# Patient Record
Sex: Female | Born: 1950 | Race: White | Hispanic: No | Smoking: Never smoker
Health system: Southern US, Community
[De-identification: ages and names within clinical notes are randomized; demographics above are authoritative.]

## PROBLEM LIST (undated history)

## (undated) DIAGNOSIS — F32A Depression, unspecified: Secondary | ICD-10-CM

## (undated) DIAGNOSIS — K219 Gastro-esophageal reflux disease without esophagitis: Secondary | ICD-10-CM

## (undated) DIAGNOSIS — R51 Headache: Secondary | ICD-10-CM

## (undated) DIAGNOSIS — F419 Anxiety disorder, unspecified: Secondary | ICD-10-CM

## (undated) DIAGNOSIS — G4730 Sleep apnea, unspecified: Secondary | ICD-10-CM

## (undated) DIAGNOSIS — D649 Anemia, unspecified: Secondary | ICD-10-CM

## (undated) DIAGNOSIS — I10 Essential (primary) hypertension: Secondary | ICD-10-CM

## (undated) DIAGNOSIS — Z22322 Carrier or suspected carrier of Methicillin resistant Staphylococcus aureus: Secondary | ICD-10-CM

## (undated) DIAGNOSIS — I209 Angina pectoris, unspecified: Secondary | ICD-10-CM

## (undated) DIAGNOSIS — F329 Major depressive disorder, single episode, unspecified: Secondary | ICD-10-CM

## (undated) DIAGNOSIS — R011 Cardiac murmur, unspecified: Secondary | ICD-10-CM

## (undated) DIAGNOSIS — N189 Chronic kidney disease, unspecified: Secondary | ICD-10-CM

## (undated) DIAGNOSIS — E559 Vitamin D deficiency, unspecified: Secondary | ICD-10-CM

## (undated) DIAGNOSIS — E119 Type 2 diabetes mellitus without complications: Secondary | ICD-10-CM

## (undated) DIAGNOSIS — R112 Nausea with vomiting, unspecified: Secondary | ICD-10-CM

## (undated) DIAGNOSIS — Z87442 Personal history of urinary calculi: Secondary | ICD-10-CM

## (undated) DIAGNOSIS — Z9889 Other specified postprocedural states: Secondary | ICD-10-CM

## (undated) DIAGNOSIS — E785 Hyperlipidemia, unspecified: Secondary | ICD-10-CM

## (undated) DIAGNOSIS — E039 Hypothyroidism, unspecified: Secondary | ICD-10-CM

## (undated) DIAGNOSIS — M1990 Unspecified osteoarthritis, unspecified site: Secondary | ICD-10-CM

## (undated) DIAGNOSIS — I739 Peripheral vascular disease, unspecified: Secondary | ICD-10-CM

## (undated) HISTORY — PX: APPENDECTOMY: SHX54

## (undated) HISTORY — PX: TONSILLECTOMY: SUR1361

## (undated) HISTORY — DX: Essential (primary) hypertension: I10

## (undated) HISTORY — PX: ABDOMINAL HYSTERECTOMY: SHX81

## (undated) HISTORY — PX: OTHER SURGICAL HISTORY: SHX169

## (undated) HISTORY — PX: CARDIAC CATHETERIZATION: SHX172

## (undated) HISTORY — PX: LEG SURGERY: SHX1003

## (undated) HISTORY — DX: Hyperlipidemia, unspecified: E78.5

## (undated) HISTORY — DX: Cardiac murmur, unspecified: R01.1

## (undated) HISTORY — PX: TUBAL LIGATION: SHX77

## (undated) HISTORY — PX: ELBOW SURGERY: SHX618

## (undated) HISTORY — PX: SHOULDER ARTHROSCOPY: SHX128

## (undated) HISTORY — PX: HARDWARE REMOVAL: SHX979

---

## 1998-08-11 ENCOUNTER — Ambulatory Visit (HOSPITAL_COMMUNITY): Admission: RE | Admit: 1998-08-11 | Discharge: 1998-08-11 | Payer: Self-pay | Admitting: Endocrinology

## 1998-09-04 ENCOUNTER — Ambulatory Visit (HOSPITAL_COMMUNITY): Admission: RE | Admit: 1998-09-04 | Discharge: 1998-09-04 | Payer: Self-pay | Admitting: Orthopedic Surgery

## 1998-09-04 ENCOUNTER — Encounter: Payer: Self-pay | Admitting: Orthopedic Surgery

## 1998-09-16 ENCOUNTER — Ambulatory Visit (HOSPITAL_BASED_OUTPATIENT_CLINIC_OR_DEPARTMENT_OTHER): Admission: RE | Admit: 1998-09-16 | Discharge: 1998-09-16 | Payer: Self-pay | Admitting: Orthopedic Surgery

## 1999-08-14 ENCOUNTER — Encounter: Payer: Self-pay | Admitting: Endocrinology

## 1999-08-14 ENCOUNTER — Ambulatory Visit (HOSPITAL_COMMUNITY): Admission: RE | Admit: 1999-08-14 | Discharge: 1999-08-14 | Payer: Self-pay | Admitting: Endocrinology

## 1999-10-21 ENCOUNTER — Ambulatory Visit (HOSPITAL_BASED_OUTPATIENT_CLINIC_OR_DEPARTMENT_OTHER): Admission: RE | Admit: 1999-10-21 | Discharge: 1999-10-22 | Payer: Self-pay | Admitting: Orthopedic Surgery

## 2000-06-28 ENCOUNTER — Encounter: Admission: RE | Admit: 2000-06-28 | Discharge: 2000-06-28 | Payer: Self-pay | Admitting: Gastroenterology

## 2000-06-28 ENCOUNTER — Encounter: Payer: Self-pay | Admitting: Gastroenterology

## 2000-07-25 ENCOUNTER — Ambulatory Visit (HOSPITAL_COMMUNITY): Admission: RE | Admit: 2000-07-25 | Discharge: 2000-07-25 | Payer: Self-pay | Admitting: Gastroenterology

## 2001-05-31 ENCOUNTER — Ambulatory Visit (HOSPITAL_BASED_OUTPATIENT_CLINIC_OR_DEPARTMENT_OTHER): Admission: RE | Admit: 2001-05-31 | Discharge: 2001-06-01 | Payer: Self-pay | Admitting: Orthopedic Surgery

## 2002-02-13 ENCOUNTER — Inpatient Hospital Stay (HOSPITAL_COMMUNITY): Admission: EM | Admit: 2002-02-13 | Discharge: 2002-02-15 | Payer: Self-pay | Admitting: Emergency Medicine

## 2002-02-13 ENCOUNTER — Encounter: Payer: Self-pay | Admitting: Emergency Medicine

## 2002-02-13 ENCOUNTER — Encounter: Payer: Self-pay | Admitting: Cardiovascular Disease

## 2002-03-29 ENCOUNTER — Ambulatory Visit (HOSPITAL_BASED_OUTPATIENT_CLINIC_OR_DEPARTMENT_OTHER): Admission: RE | Admit: 2002-03-29 | Discharge: 2002-03-29 | Payer: Self-pay | Admitting: Orthopedic Surgery

## 2002-05-02 ENCOUNTER — Encounter: Payer: Self-pay | Admitting: Obstetrics and Gynecology

## 2002-05-02 ENCOUNTER — Ambulatory Visit (HOSPITAL_COMMUNITY): Admission: RE | Admit: 2002-05-02 | Discharge: 2002-05-02 | Payer: Self-pay | Admitting: Obstetrics and Gynecology

## 2003-02-14 ENCOUNTER — Encounter: Admission: RE | Admit: 2003-02-14 | Discharge: 2003-02-14 | Payer: Self-pay | Admitting: Endocrinology

## 2003-02-14 ENCOUNTER — Encounter: Payer: Self-pay | Admitting: Endocrinology

## 2003-06-11 ENCOUNTER — Encounter: Payer: Self-pay | Admitting: Endocrinology

## 2003-06-11 ENCOUNTER — Ambulatory Visit (HOSPITAL_COMMUNITY): Admission: RE | Admit: 2003-06-11 | Discharge: 2003-06-11 | Payer: Self-pay | Admitting: Endocrinology

## 2004-10-20 ENCOUNTER — Ambulatory Visit: Payer: Self-pay | Admitting: Endocrinology

## 2004-10-27 ENCOUNTER — Ambulatory Visit: Payer: Self-pay | Admitting: Endocrinology

## 2005-07-26 ENCOUNTER — Ambulatory Visit (HOSPITAL_COMMUNITY): Admission: RE | Admit: 2005-07-26 | Discharge: 2005-07-26 | Payer: Self-pay | Admitting: Obstetrics and Gynecology

## 2005-10-28 ENCOUNTER — Encounter: Payer: Self-pay | Admitting: Pulmonary Disease

## 2005-12-31 ENCOUNTER — Encounter: Payer: Self-pay | Admitting: Pulmonary Disease

## 2006-01-17 ENCOUNTER — Encounter: Payer: Self-pay | Admitting: Pulmonary Disease

## 2006-02-06 ENCOUNTER — Encounter: Admission: RE | Admit: 2006-02-06 | Discharge: 2006-02-06 | Payer: Self-pay | Admitting: Internal Medicine

## 2006-07-29 ENCOUNTER — Encounter: Admission: RE | Admit: 2006-07-29 | Discharge: 2006-07-29 | Payer: Self-pay | Admitting: Sports Medicine

## 2006-09-08 ENCOUNTER — Ambulatory Visit (HOSPITAL_COMMUNITY): Admission: RE | Admit: 2006-09-08 | Discharge: 2006-09-08 | Payer: Self-pay | Admitting: Internal Medicine

## 2006-09-13 ENCOUNTER — Encounter: Admission: RE | Admit: 2006-09-13 | Discharge: 2006-09-13 | Payer: Self-pay | Admitting: Internal Medicine

## 2007-04-05 ENCOUNTER — Encounter: Admission: RE | Admit: 2007-04-05 | Discharge: 2007-04-05 | Payer: Self-pay | Admitting: Internal Medicine

## 2007-09-26 ENCOUNTER — Encounter: Admission: RE | Admit: 2007-09-26 | Discharge: 2007-09-26 | Payer: Self-pay | Admitting: Internal Medicine

## 2007-10-20 ENCOUNTER — Ambulatory Visit (HOSPITAL_COMMUNITY): Admission: RE | Admit: 2007-10-20 | Discharge: 2007-10-20 | Payer: Self-pay | Admitting: Obstetrics and Gynecology

## 2007-10-27 ENCOUNTER — Encounter: Admission: RE | Admit: 2007-10-27 | Discharge: 2007-10-27 | Payer: Self-pay | Admitting: Anesthesiology

## 2007-12-08 ENCOUNTER — Encounter: Admission: RE | Admit: 2007-12-08 | Discharge: 2007-12-08 | Payer: Self-pay | Admitting: Internal Medicine

## 2008-01-19 ENCOUNTER — Encounter: Admission: RE | Admit: 2008-01-19 | Discharge: 2008-01-19 | Payer: Self-pay | Admitting: Gastroenterology

## 2008-04-26 ENCOUNTER — Encounter: Admission: RE | Admit: 2008-04-26 | Discharge: 2008-04-26 | Payer: Self-pay | Admitting: Internal Medicine

## 2008-11-07 ENCOUNTER — Encounter: Admission: RE | Admit: 2008-11-07 | Discharge: 2008-11-07 | Payer: Self-pay | Admitting: Internal Medicine

## 2009-04-11 ENCOUNTER — Encounter: Admission: RE | Admit: 2009-04-11 | Discharge: 2009-04-11 | Payer: Self-pay | Admitting: Cardiology

## 2009-04-18 ENCOUNTER — Encounter: Payer: Self-pay | Admitting: Pulmonary Disease

## 2009-05-06 ENCOUNTER — Encounter: Payer: Self-pay | Admitting: Pulmonary Disease

## 2009-05-09 ENCOUNTER — Encounter: Payer: Self-pay | Admitting: Pulmonary Disease

## 2009-05-09 ENCOUNTER — Ambulatory Visit (HOSPITAL_COMMUNITY): Admission: RE | Admit: 2009-05-09 | Discharge: 2009-05-09 | Payer: Self-pay | Admitting: Cardiology

## 2009-06-04 DIAGNOSIS — R609 Edema, unspecified: Secondary | ICD-10-CM | POA: Insufficient documentation

## 2009-06-04 DIAGNOSIS — E119 Type 2 diabetes mellitus without complications: Secondary | ICD-10-CM

## 2009-06-04 DIAGNOSIS — IMO0001 Reserved for inherently not codable concepts without codable children: Secondary | ICD-10-CM | POA: Insufficient documentation

## 2009-06-04 DIAGNOSIS — G4733 Obstructive sleep apnea (adult) (pediatric): Secondary | ICD-10-CM | POA: Insufficient documentation

## 2009-06-04 DIAGNOSIS — I10 Essential (primary) hypertension: Secondary | ICD-10-CM | POA: Insufficient documentation

## 2009-06-04 DIAGNOSIS — Z794 Long term (current) use of insulin: Secondary | ICD-10-CM

## 2009-06-05 ENCOUNTER — Ambulatory Visit: Payer: Self-pay | Admitting: Pulmonary Disease

## 2009-06-05 DIAGNOSIS — R0602 Shortness of breath: Secondary | ICD-10-CM | POA: Insufficient documentation

## 2009-06-20 ENCOUNTER — Ambulatory Visit (HOSPITAL_COMMUNITY): Admission: RE | Admit: 2009-06-20 | Discharge: 2009-06-20 | Payer: Self-pay | Admitting: Cardiology

## 2009-07-04 ENCOUNTER — Ambulatory Visit: Payer: Self-pay | Admitting: Pulmonary Disease

## 2009-07-11 ENCOUNTER — Telehealth (INDEPENDENT_AMBULATORY_CARE_PROVIDER_SITE_OTHER): Payer: Self-pay | Admitting: *Deleted

## 2009-07-18 ENCOUNTER — Ambulatory Visit: Payer: Self-pay | Admitting: Pulmonary Disease

## 2009-11-14 ENCOUNTER — Encounter: Admission: RE | Admit: 2009-11-14 | Discharge: 2009-11-14 | Payer: Self-pay | Admitting: Internal Medicine

## 2010-01-08 ENCOUNTER — Ambulatory Visit: Payer: Self-pay | Admitting: Vascular Surgery

## 2010-11-29 ENCOUNTER — Encounter: Payer: Self-pay | Admitting: Endocrinology

## 2010-12-28 ENCOUNTER — Other Ambulatory Visit: Payer: Self-pay | Admitting: Internal Medicine

## 2010-12-28 DIAGNOSIS — Z1231 Encounter for screening mammogram for malignant neoplasm of breast: Secondary | ICD-10-CM

## 2011-01-08 ENCOUNTER — Ambulatory Visit
Admission: RE | Admit: 2011-01-08 | Discharge: 2011-01-08 | Disposition: A | Discharge status: Home / Self Care | Payer: PRIVATE HEALTH INSURANCE | Source: Ambulatory Visit | Attending: Internal Medicine | Admitting: Internal Medicine

## 2011-01-08 DIAGNOSIS — Z1231 Encounter for screening mammogram for malignant neoplasm of breast: Secondary | ICD-10-CM

## 2011-02-14 LAB — POCT I-STAT 3, ART BLOOD GAS (G3+)
Acid-Base Excess: 4 mmol/L — ABNORMAL HIGH (ref 0.0–2.0)
O2 Saturation: 97 %
TCO2: 29 mmol/L (ref 0–100)
pCO2 arterial: 38.4 mmHg (ref 35.0–45.0)
pO2, Arterial: 85 mmHg (ref 80.0–100.0)

## 2011-02-14 LAB — POCT I-STAT 3, VENOUS BLOOD GAS (G3P V)
pCO2, Ven: 42.6 mmHg — ABNORMAL LOW (ref 45.0–50.0)
pH, Ven: 7.384 — ABNORMAL HIGH (ref 7.250–7.300)
pO2, Ven: 37 mmHg (ref 30.0–45.0)

## 2011-02-14 LAB — GLUCOSE, CAPILLARY: Glucose-Capillary: 129 mg/dL — ABNORMAL HIGH (ref 70–99)

## 2011-03-23 NOTE — Procedures (Signed)
DUPLEX DEEP VENOUS EXAM - LOWER EXTREMITY   INDICATION:  Left calf pain/swelling.  Rule out deep venous thrombosis.   HISTORY:  Edema:  Intermittent left calf swelling for 9 months.  Trauma/Surgery:  History of left greater saphenous vein laser ablation  about 2 years ago.  Pain:  Intermittent left calf pain for about 9 months.  PE:  No.  Previous DVT:  No.  Anticoagulants:  Other:   DUPLEX EXAM:                CFV   SFV   PopV  PTV    GSV                R  L  R  L  R  L  R   L  R  L  Thrombosis    o  o     o     o      o  Spontaneous   +  +     +     +      +  Phasic        +  +     +     +      +  Augmentation  +  +     +     +      +  Compressible  +  +     +     +      +  Competent   Legend:  + - yes  o - no  p - partial  D - decreased   IMPRESSION:  1. No evidence of deep venous thrombosis noted in the left lower      extremity.  2. Limited visualization of the left calf veins due to patient body      habitus.  3. Evidence of left greater saphenous vein laser ablation noted.   A preliminary report was called to Dr. Lynne Logan office on 01/08/2010 and  given to [facility].    _____________________________  Di Kindle. Edilia Bo, M.D.   CH/MEDQ  D:  01/09/2010  T:  01/09/2010  Job:  [id number]

## 2011-03-23 NOTE — Cardiovascular Report (Signed)
Sue Davidson, Sue Davidson               ACCOUNT NO.:  [id number]   MEDICAL RECORD NO.:  [id number]          PATIENT TYPE:  OIB   LOCATION:  2899                         FACILITY:  [facility]   PHYSICIAN:  Thereasa Solo. Little, M.D. DATE OF BIRTH:  Jun 10, 1951   DATE OF PROCEDURE:  06/20/2009  DATE OF DISCHARGE:  06/20/2009                            CARDIAC CATHETERIZATION   INDICATIONS FOR TEST:  This 60 year old female has a strong family  history of heart disease.  She is insulin-dependent and she has  obstructive sleep apnea.  She has been having worsening problems with  lower extremity edema.  She had a negative nuclear study in 2008.  She  was seen by Dr. Shelle Iron who felt he needed right heart pressures to  resolve the pulmonary hypertension issue.  She is brought in as an  outpatient for elective right and left heart catheterization.   PROCEDURE:  The patient was prepped and draped in the usual sterile  fashion exposing both groins.  Local anesthetic with 1% Xylocaine.  It  took over an hour and 15 minutes to gain access.  Dr. Mariah Milling was able to  get a 7-French introducer sheath in the right femoral vein and I was  able to place a 5-French introducer sheath in the left femoral artery.  Following this, a Swan-Ganz catheter was advanced through its normal  route into the pulmonary artery with hemodynamic monitoring undertaken  throughout each station.  Cardiac output by thermodilution was  performed.  Oxygen saturations in both the AO and PA were obtained.   COMPLICATIONS:  None.   EQUIPMENT:  5-French Judkins configuration catheters and a 7-French Swan-  Ganz catheter.   RESULTS:  1. Right atrial pressure 5, right ventricular pressure 31/1, pulmonary      artery pressure 22/9, wedge was 6.   Central aortic pressure 115/55.  Left ventricular pressure 115/3 with no  aortic valve gradient noted on pullback.  1. Cardiac output by thermodilution was 4.69 for cardiac index of 2.0.  2.  Ventriculography:  Ventriculography in the RAO projection performed      at the beginning of the procedure revealed normal LV systolic      function.  There was PVC-induced mitral regurgitation.  The left      ventricular end-diastolic pressure was 12.  3. Coronary arteriography:  On fluoroscopy there was faint      calcification seen in the proximal LAD.      a.     Left main normal and bifurcated.      b.     Circumflex.  The circumflex was normal.  It gave rise to an       OM #1 which was a medium-sized vessel, OM #2 which was a larger       vessel that bifurcated and the ongoing circumflex was relatively       small.      c.     LAD.  The LAD extended to the apex of the heart gave rise to       a first diagonal vessel and was free of disease.  There was       insignificant proximal calcification.      d.     Right coronary artery.  There was proximal 30% narrowing of       the right coronary artery.  Distal vessel and the PDA was free of       disease.   CONCLUSIONS:  1. No significant occlusive coronary artery disease.  2. Normal LV function with normal left ventricular end-diastolic      pressure.  3. Normal pulmonary artery pressure 22/9 with an RV pressure of 31/1.   From a cardiac standpoint, I can clearly not explain her edema based on  her pulmonary artery pressures or LV function.  She will be discharged  to home today with followup in my office first of the week.           ______________________________  Thereasa Solo. Little, M.D.     ABL/MEDQ  D:  06/20/2009  T:  06/21/2009  Job:  [id number]   cc:   Juline Patch, M.D.  Barbaraann Share, MD,FCCP  Cath Lab

## 2011-03-26 NOTE — Op Note (Signed)
[place]. [facility]  Patient:    Sue Davidson, Sue Davidson                        MRN: [id number] Proc. Date: 05/31/01 Attending:  Nicki Reaper, M.D. CC:         Nicki Reaper, M.D. 2 copies   Operative Report  PREOPERATIVE DIAGNOSIS:  Impingement of right shoulder.  POSTOPERATIVE DIAGNOSIS:  Impingement of right shoulder.  OPERATION:  Arthroscopic subacromial decompression, manipulation of the right shoulder.  SURGEON:  Nicki Reaper, M.D.  ASSISTANT:  Artist Pais. Mina Marble, M.D.  ANESTHESIA:  General.  ANESTHESIOLOGIST:  Edwin Cap. Zoila Shutter, M.D.  HISTORY OF PRESENT ILLNESS:  The patient is a 60 year old female with a history of impingement of her right shoulder.  She has undergone a subacromial decompression on the left side with excellent results.  She is admitted now for a procedure on the right side.  MRI reveals no rotator cuff tear.  DESCRIPTION OF PROCEDURE:  The patient was brought to the operating room where a general endotracheal intubation anesthesia were carried out without difficulty.  She was prepped and draped using Betadine scrubbing solution. With the right shoulder free in a beach chair position, the joint was inflated through the posterior portal after marking landmarks with 30 cc of saline. The scope was then introduced.  Manipulation of the shoulder was performed to maximize mobility, in that there was decreased external rotation abduction.  A moderate amount of bleeding was present, and moderate synovitis was present. The rotator cuff was intact.  The humeral head, glenoid both showed intact cartilage.  Biceps tendon was intact.  The anterior capsule ligaments were also intact.  The subacromial space was then opened through the posterior portal using a blunt trocar.  This space was widened.  A lateral portal was then established, localizing this with a spinal needle.  The subacromial bursa was then removed with the [employer] wand, a  shaver.  A spur was identified anteriorly.  A subacromial acromionizer burr was then placed, and the anterior margin of the acromion was removed, along with the inferior margin of the clavicle where spurs were encountered.  Bleeders were electrocauterized. Complete resection of the bursa was performed.  The rotator cuff was intact. The instruments were removed.  Irrigation complete.  The area injected with 0.25% Marcaine with epinephrine.  A sterile compressive dressing and sling applied.  The patient tolerated the procedure well, and was taken to the recovery room for observation in satisfactory condition.  She is discharged home to return to the [facility] [place] in one week on Percocet and Keflex. DD:  05/31/01 TD:  05/31/01 Job: [id number]

## 2011-03-26 NOTE — Cardiovascular Report (Signed)
[place]. [facility]  Patient:    Sue Davidson, Sue Davidson Visit Number: [id number] MRN: [id number]          Service Type: MED Location: [phone] Attending Physician:  Berry, Jonathan [place] Dictated by:   Madaline Savage, M.D. Proc. Date: 02/14/02 Admit Date:  02/13/2002   CC:         Julieanne Manson, M.D.  Cardiac Catheterization Laboratory   Cardiac Catheterization  PROCEDURES PERFORMED: 1. Selective coronary angiography by Judkins technique. 2. Retrograde left heart catheterization. 3. Left ventricular angiography.  COMPLICATIONS: None.  ENTRY SITE: Right femoral.  DYE USED: Omnipaque.  PATIENT PROFILE: The patient is an obese, diabetic woman, who is 60 years old old, who had anterior chest pain that was very suggestive of coronary ischemia. Cardiac enzymes and ECGs have been negative. She enters the catheterization lab electively today to define coronary anatomy in view of her diabetes and symptoms.  RESULTS:  PRESSURES: The left ventricular pressure was 115/19, central aortic pressure 115/60 and mean of 75.  No significant aortic valve gradient noted.  ANGIOGRAPHIC RESULTS: The left main coronary artery was normal.  The LAD and diagonal were also normal.  The circumflex consisted of a major obtuse marginal branch, which was normal and a fairly large circumflex, which bifurcated distally and showed no lesions.  The right coronary artery was a dominant vessel of the circulation. No lesions were seen.  LEFT VENTRICULOGRAM: The left ventriculogram showed normal contractility, ejection fraction that was exercise stress test at 65%. No mitral regurgitation or LV thrombus seen.  FINAL DIAGNOSES: 1. Normal left ventricular systolic function. 2. Angiographically patent coronary arteries. Dictated by:   Madaline Savage, M.D. Attending Physician:  Berry, Jonathan [place] DD:  02/14/02 TD:  02/15/02 Job: [id number]

## 2011-03-26 NOTE — Procedures (Signed)
[place]. [facility]  Patient:    Sue Davidson, Sue Davidson                      MRN: [id number] Proc. Date: 07/25/00 Adm. Date:  [id number] Attending:  Nelda Marseille CC:         Alfonse Alpers. Dagoberto Ligas, M.D.  Thereasa Solo. Little, M.D.   Procedure Report  PROCEDURE:  Colonoscopy.  SURGEON:  Petra Kuba, M.D.  INDICATIONS:  Family history of colon cancer, colon polyps, multiple GI complaints, due for a screening.  INFORMED CONSENT:  Consent was signed after risk, benefits, methods and options were thoroughly discussed in the office.  MEDICINES USED:  Demerol 100 mg, Versed 10 mg.  DESCRIPTION OF PROCEDURE:  Rectal inspection is pertinent for external hemorrhoids.  Digital exam was negative.  Video pediatric colonoscope was inserted and fairly easily advanced to the level of the probable mid transverse.  At that point the scope began to loop and despite using the stiffener device rolling her on back and her right side and multiple abdominal pressures we were unable to advance past what we thought may have been the hepatic flexure.  The pediatric video colonoscope was slowly withdrawn.  No abnormalities were seen.  Once back in the rectum the scope was retroflexed pertinent for some internal hemorrhoids.  The scope was removed and the video colonoscope was inserted and advanced to the same level, this time however, with looping, we rolled her on her back and with using both left and right sided pressure we were able to advance to the cecum which was identified by the appendiceal orifice and the ileocecal valve.  Possibly on insertion in the hepatic flexure area a small 1-2 mm poly was seen which was not biopsied on insertion.  On slow withdrawal the prep was adequate.  There was some liquid stool that required washing and suctioning but on slow withdrawal we could not find the polyp mentioned above.  The scope was re-withdrawn, no additional findings were seen.   Anorectal ______ confirmed the hemorrhoids.  The scope was reinserted a short ways, air was suctioned, scope removed.  The patient tolerated the procedure adequately. There was no obvious immediate complications.  ENDOSCOPIC DIAGNOSIS: 1. Internal/external hemorrhoids. 2. Questionable tiny polyp on the right side of the colon seen on insertion    not found on withdrawal. 3. Tortuous long looping colon. 4. Otherwise within normal limits to the cecum.  PLAN:  Yearly rectals and guaiacs per primary care and Dr. Dagoberto Ligas.  Would consider an air contrast barium enema p.r.n. or in 5 years when screening is reneeded.  Might also have an option of virtual colonoscopy or other screening mechanisms at that junction.  I would be happy to see back sooner p.r.n. otherwise will see her back in 6 weeks to recheck symptoms and make sure no further work-up plans are needed. DD:  07/25/00 TD:  07/26/00 Job: [id number]

## 2011-03-26 NOTE — Discharge Summary (Signed)
[place]. [facility]  Patient:    Sue Davidson, Sue Davidson Visit Number: [id number] MRN: [id number]          Service Type: MED Location: [phone] Attending Physician:  Berry, Jonathan [place] Dictated by:   Adrian Saran, N.P. Admit Date:  02/13/2002 Discharge Date: 02/15/2002   CC:         Alfonse Alpers. Dagoberto Ligas, M.D.   Discharge Summary  DISCHARGE DIAGNOSES: 1. Chest pain noncardiac. 2. Intermittent tachycardia. 3. Hypertension. 4. Diabetes. 5. Hyperlipidemia. 6. Systolic ejection murmur.  PROCEDURE:  Cardiac catheterization.  COMPLICATIONS:  None.  CONDITION ON DISCHARGE:  Stable.  HISTORY OF PRESENT ILLNESS:  This is a 60 year old female patient who has had approximately three to four-day history of scapular pain that radiates to her anterior chest.  These are all exertionally induced and she has associated nausea, diaphoresis, and shortness of breath.  For approximately the last two to three weeks, she has had generalized fatigue as well as lower extremity weakness.  She has also had a complaint of tachycardia with facial flushing and diaphoresis for the last several weeks.  She has been unaware of any irregularity to her heart rhythm.  On the day of admission, she apparently was driving to work when she had another onset of this tachycardia, flushing, and diaphoresis.  This time, however, it was associated with near syncope.  She managed to get back home and call her son who is a Company secretary who came and checked her blood pressure and found that it was somewhat elevated.  She was transported to the emergency room by EMS.  They administered one sublingual nitroglycerin in route.  She apparently developed the shoulder discomfort again which apparently was somewhat relieved with the administration of the nitroglycerin.  She was given another nitroglycerin in the ER without any further change in her discomfort.  PHYSICAL EXAMINATION:  GENERAL: She is  conscious and alert, well-oriented. VITAL SIGNS: Blood pressure 160/57, heart rate 77, respirations 18.  She is afebrile and her O2 sat was 97% on 2 liters.  EKG showed a normal sinus rhythm without any acute ST or T wave changes.  Chest x-ray showed no active disease.   LABORATORY DATA:  Normal CBC and BMP with the exception of glucose at 200. Initial cardiac enzymes showed a total CK of 103 with 2 MBs and troponin of 0.01.  There was no JVD or thyromegaly.  She did have bilateral carotid bruit.  Lungs were clear.  Heart regular rate and rhythm.  2/6 systolic murmur was noted at the left sternal border with no radiation.  Abdomen was obese, however, benign. Normal bowel sounds.  No bruit or hepatosplenomegaly.  Extremities were not edematous.  Upper extremity pulses +2 bilaterally.  Lower extremity pulses +1 bilaterally.  There was no bruit.  No focal deficits.  No neurological deficits.  HOSPITAL COURSE:  The patient was admitted with shortness of breath and bilateral shoulder pain as well as chest discomfort.  She will be ruled out for MI with serial enzymes.  She was planned for cardiac catheterization the next morning.  She was placed on IV nitroglycerin and heparin.  Aspirin was continued.  ACE inhibitor as well as Plavix were added to her regime. Two-dimensional echocardiogram was ordered to further evaluate her murmur.  CT scan of the chest and abdomen were ordered as well to rule out aortic dissection with her complaining of shoulder and back pain.  Results were normal.  The patient was taken  to the cardiac catheterization on February 14, 2002.  She had no CAD noted.  Coronary arteries were normal.  She had normal LV function. She remained pain-free for the rest of her admission.  Vital signs remained stable.  Repeat cardiac enzymes were negative.  She had some mild tachycardia the day after her catheterization, however, she was completely asymptomatic and was actually in bed  when it occurred.  Lopressor was continued.  Results of echocardiogram done on February 15, 2002, showed normal LV size and function with an EF noted to be 55 to 65%.  She had mild MR noted as well as pulmonary systolic pressure with mildly to moderately increased at 38 mmHg.  The patient was discharged home on February 15, 2002, in stable condition with no further complaints.  DISCHARGE MEDICATIONS: 1. Lipitor same dose as she was previously taking at home at h.s. 2. Actos 45 mg q.d. 3. Aspirin 81 mg q.d. 4. Triamterene/HCTZ 37.5/25 mg q.d. 5. Humalog Insulin 75/25 as taken at home previously. 6. Prinivil 10 mg 1/2 tablet q.d. 7. Lopressor 50 mg 1/2 tablet q.d.  She is not to engage in any strenuous activity, driving, or sexual activity for the next two days.  She is not to lift anything more than 5 pounds. She may resume her normal activities on Saturday, February 17, 2002.  She is to maintain a low salt, low fat, low cholesterol diet as well as her diabetic diet restrictions.  She may shower or bathe the following day, Friday, February 16, 2002.  If she has any noticable increase in pain, bruising, or swelling to her catheterization site, she is to contact Dr. Clarene Duke.  FOLLOW-UP:  She is to follow up with Dr. Clarene Duke in two to three weeks. She is to call for an appointment. Dictated by:   Adrian Saran, N.P. Attending Physician:  Berry, Jonathan [place] DD:  03/16/02 TD:  03/19/02 Job: [id number]

## 2011-03-26 NOTE — Op Note (Signed)
[place]. [facility]  Patient:    Sue Davidson, Sue Davidson Visit Number: [id number] MRN: [id number]          Service Type: DSU Location: [facility] Attending Physician:  Ronne Binning Dictated by:   Nicki Reaper, M.D. Proc. Date: 03/29/02 Admit Date:  03/29/2002                             Operative Report  PREOPERATIVE DIAGNOSIS:  de Quervains, left wrist.  POSTOPERATIVE DIAGNOSIS:  de Quervains, left wrist.  OPERATION:  Release of first dorsal compartment of left wrist.  SURGEON:  Nicki Reaper, M.D.  ASSISTANT:  Joaquin Courts, R.N.  ANESTHESIA:  Upper arm IV regional.  ANESTHESIOLOGIST:  Maren Beach, M.D.  HISTORY:  The patient is a 60 year old female with a history of de Quervains on her left wrist which has not responded to conservative treatment for a long period of time.  DESCRIPTION OF PROCEDURE:  The patient was brought to the operating room where a upper arm IV regional anesthetic was carried out without difficulty.  She was prepped and draped using Betadine scrubbing solution with the left arm free.  Longitudinal incision was made over the first dorsal compartment and carried down through subcutaneous tissue.  Bleeders were electrocauterized. Radial nerve was identified and protected.  The dissection carried down to the first dorsal compartment, which was found to be markedly thickened.  An incision was then made on the dorsal aspect of the compartment, releasing the extensor brevis and abductor longus.  No septum was present.  Significant adhesions were present between the tendons and multiple slips were present to the APL tendon.  The thumb was placed through a full range of motion; no further catching was identified.  The wound was irrigated.  The skin was closed with interrupted 5-0 nylon sutures.  A sterile compressive dressing and wrist splint were applied including the thumb.  The patient tolerated the procedure well and was taken to  the recovery room for observation in satisfactory condition.    She is discharged home to return to the [facility] [place] in one week on Vicodin and Keflex. Dictated by:   Nicki Reaper, M.D. Attending Physician:  Ronne Binning DD:  03/29/02 TD:  03/30/02 Job: [id number]

## 2011-07-14 ENCOUNTER — Observation Stay (HOSPITAL_COMMUNITY)
Admission: EM | Admit: 2011-07-14 | Discharge: 2011-07-15 | Disposition: A | Discharge status: Home / Self Care | Payer: 59 | Attending: Cardiovascular Disease | Admitting: Cardiovascular Disease

## 2011-07-14 ENCOUNTER — Emergency Department (HOSPITAL_COMMUNITY): Payer: 59

## 2011-07-14 DIAGNOSIS — R0989 Other specified symptoms and signs involving the circulatory and respiratory systems: Principal | ICD-10-CM | POA: Insufficient documentation

## 2011-07-14 DIAGNOSIS — R61 Generalized hyperhidrosis: Secondary | ICD-10-CM | POA: Insufficient documentation

## 2011-07-14 DIAGNOSIS — R609 Edema, unspecified: Secondary | ICD-10-CM | POA: Insufficient documentation

## 2011-07-14 DIAGNOSIS — R110 Nausea: Secondary | ICD-10-CM | POA: Insufficient documentation

## 2011-07-14 DIAGNOSIS — R079 Chest pain, unspecified: Secondary | ICD-10-CM | POA: Insufficient documentation

## 2011-07-14 DIAGNOSIS — E785 Hyperlipidemia, unspecified: Secondary | ICD-10-CM | POA: Insufficient documentation

## 2011-07-14 DIAGNOSIS — M549 Dorsalgia, unspecified: Secondary | ICD-10-CM | POA: Insufficient documentation

## 2011-07-14 DIAGNOSIS — R0602 Shortness of breath: Secondary | ICD-10-CM | POA: Insufficient documentation

## 2011-07-14 DIAGNOSIS — I10 Essential (primary) hypertension: Secondary | ICD-10-CM | POA: Insufficient documentation

## 2011-07-14 DIAGNOSIS — Z8249 Family history of ischemic heart disease and other diseases of the circulatory system: Secondary | ICD-10-CM | POA: Insufficient documentation

## 2011-07-14 DIAGNOSIS — R0609 Other forms of dyspnea: Principal | ICD-10-CM | POA: Insufficient documentation

## 2011-07-14 DIAGNOSIS — Z794 Long term (current) use of insulin: Secondary | ICD-10-CM | POA: Insufficient documentation

## 2011-07-14 DIAGNOSIS — C001 Malignant neoplasm of external lower lip: Secondary | ICD-10-CM | POA: Insufficient documentation

## 2011-07-14 DIAGNOSIS — G4733 Obstructive sleep apnea (adult) (pediatric): Secondary | ICD-10-CM | POA: Insufficient documentation

## 2011-07-14 DIAGNOSIS — E119 Type 2 diabetes mellitus without complications: Secondary | ICD-10-CM | POA: Insufficient documentation

## 2011-07-14 LAB — DIFFERENTIAL
Basophils Absolute: 0.1 10*3/uL (ref 0.0–0.1)
Basophils Relative: 1 % (ref 0–1)
Eosinophils Absolute: 0.1 10*3/uL (ref 0.0–0.7)
Monocytes Absolute: 0.6 10*3/uL (ref 0.1–1.0)
Monocytes Relative: 6 % (ref 3–12)
Neutro Abs: 6.6 10*3/uL (ref 1.7–7.7)

## 2011-07-14 LAB — AMYLASE: Amylase: 62 U/L (ref 0–105)

## 2011-07-14 LAB — CBC
Hemoglobin: 13.7 g/dL (ref 12.0–15.0)
MCH: 29 pg (ref 26.0–34.0)
MCHC: 33.7 g/dL (ref 30.0–36.0)
Platelets: 265 10*3/uL (ref 150–400)
RDW: 15.5 % (ref 11.5–15.5)

## 2011-07-14 LAB — COMPREHENSIVE METABOLIC PANEL
ALT: 19 U/L (ref 0–35)
BUN: 24 mg/dL — ABNORMAL HIGH (ref 6–23)
CO2: 32 mEq/L (ref 19–32)
Calcium: 9.9 mg/dL (ref 8.4–10.5)
GFR calc Af Amer: >60 mL/min (ref >60)
GFR calc non Af Amer: 50 mL/min — ABNORMAL LOW (ref >60)
Glucose, Bld: 149 mg/dL — ABNORMAL HIGH (ref 70–99)
Sodium: 139 mEq/L (ref 135–145)
Total Protein: 8 g/dL (ref 6.0–8.3)

## 2011-07-14 LAB — MAGNESIUM: Magnesium: 2.5 mg/dL (ref 1.5–2.5)

## 2011-07-14 LAB — URINALYSIS, ROUTINE W REFLEX MICROSCOPIC
Bilirubin Urine: NEGATIVE
Ketones, ur: NEGATIVE mg/dL
Leukocytes, UA: NEGATIVE
Nitrite: NEGATIVE
Protein, ur: NEGATIVE mg/dL
Urobilinogen, UA: 0.2 mg/dL (ref 0.0–1.0)
pH: 7 (ref 5.0–8.0)

## 2011-07-14 LAB — BASIC METABOLIC PANEL
BUN: 22 mg/dL (ref 6–23)
Calcium: 9.8 mg/dL (ref 8.4–10.5)
Chloride: 98 mEq/L (ref 96–112)
Creatinine, Ser: 1.1 mg/dL (ref 0.50–1.10)
GFR calc Af Amer: >60 mL/min (ref >60)
GFR calc non Af Amer: 50 mL/min — ABNORMAL LOW (ref >60)

## 2011-07-14 LAB — POCT I-STAT TROPONIN I: Troponin i, poc: 0 ng/mL (ref 0.00–0.08)

## 2011-07-14 LAB — PRO B NATRIURETIC PEPTIDE: Pro B Natriuretic peptide (BNP): 63.3 pg/mL (ref 0–125)

## 2011-07-14 LAB — PROTIME-INR
INR: 0.96 (ref 0.00–1.49)
Prothrombin Time: 13 seconds (ref 11.6–15.2)

## 2011-07-15 LAB — HEMOGLOBIN A1C
Hgb A1c MFr Bld: 8.1 % — ABNORMAL HIGH (ref <5.7)
Mean Plasma Glucose: 186 mg/dL — ABNORMAL HIGH (ref <117)

## 2011-07-15 LAB — GLUCOSE, CAPILLARY
Glucose-Capillary: 186 mg/dL — ABNORMAL HIGH (ref 70–99)
Glucose-Capillary: 297 mg/dL — ABNORMAL HIGH (ref 70–99)

## 2011-07-15 LAB — LIPID PANEL
Cholesterol: 225 mg/dL — ABNORMAL HIGH (ref 0–200)
LDL Cholesterol: 158 mg/dL — ABNORMAL HIGH (ref 0–99)
VLDL: 37 mg/dL (ref 0–40)

## 2011-07-17 NOTE — Discharge Summary (Signed)
Sue Davidson, Sue Davidson               ACCOUNT NO.:  [id number]  MEDICAL RECORD NO.:  [id number]  LOCATION:  4732                         FACILITY:  [facility]  PHYSICIAN:  Thurmon Fair, MD     DATE OF BIRTH:  1951/01/15  DATE OF ADMISSION:  Jul 25, 2011 DATE OF DISCHARGE:  07/15/2011                              DISCHARGE SUMMARY   DISCHARGE DIAGNOSES: 1. Dyspnea on exertion. 2. Subscapular pain.  Cardiac enzymes were checked and negative. 3. Minor coronary artery disease in 2003 also again noted on cardiac     cath in August 2010.  No significant occlusive coronary artery     disease. 4. Diabetes mellitus type 2. 5. Hypertension. 6. Hyperlipidemia. 7. Morbid obesity. 8. Obstructive sleep apnea, uses CPAP. 9. Chronic lower extremity edema.  HOSPITAL COURSE:  Sue Davidson is a 60 year old morbidly obese female seen by Dr. Clarene Duke and Dr. Ricki Miller.  She has history of nonobstructive coronary artery disease, obstructive sleep apnea, hyperlipidemia, diabetes mellitus type 2, chronic lower extremity edema.  She presents with increasing dyspnea on exertion over the 2 weeks prior to admission.  She also complained of a dizzy spell at work prior to admission as well as chest pain between her shoulder blades.  She was admitted for observation to rule out acute coronary syndrome.  Initial cardiac enzymes were negative.  Also checked BMP, hemoglobin A1c, amylase and lipase, lipid panel, TSH, results listed below.  The patient also complained of nausea which has improved along with the back pain which has been improving.  EKG showed sinus rhythm with poor anterior R-wave progression.  Amylase and lipase were normal.  BMP normal.  She had been seen by Dr. Royann Shivers who feels she is ready for discharge home.  Her subscapular pain was likely musculoskeletal in nature.  It is exacerbated with movement inspiration.  The patient indicates that she had just started exercising on Saturday and has been going  just every day doing water aerobics, lifting weights, doing Ab Crunches machine. She has been encouraged to continue with weight loss and improving her diet.  DISCHARGE LABS:  WBC 9.8, hemoglobin 13.7, hematocrit 40.7, platelets 265,000.  PT 13, INR 0.96, PTT 30, D-dimer was 0.29.  Sodium 139, potassium 4.1, chloride 97, carbon dioxide 32, glucose 149, BUN 24, creatinine 1.11, total bilirubin 0.3, alkaline phosphatase 93, AST 22, ALT 19, total protein 8.0, albumin 3.7, calcium 9.9, magnesium 2.5, hemoglobin A1c was 8.1, amylase 62, lipase of 39, troponin 0.  BNP 63.3, total cholesterol 225, triglycerides 186, HDL 30, LDL 158, VLDL 37, and total cholesterol HDL ratio of 7.5.  TSH is 8.657.  Urinalysis was within normal limits and MRSA negative.  STUDIES/PROCEDURES:  Chest x-ray 25-Jul-2011 shows no active lung disease.  Mediastinal contours are normal.  The heart is within normal limits in size.  No bony abnormality was seen.  DISCHARGE MEDICATIONS: 1. Acetaminophen 325 mg 2 tablets by mouth every 4 hours as needed for     pain. 2. Metoprolol tartrate 25 mg 1/2 tablet by mouth twice daily. 3. Aspirin enteric coated 81 mg 1 tablet by mouth daily. 4. Benicar HCT 40/25 mg 1/2 tablet by mouth daily. 5.  Crestor 10 mg 1 tablet by mouth daily. 6. Furosemide 40 mg 1 tablet by mouth daily. 7. Humalog Mix 70/25, 40-85 units per sliding scale 3 times daily.  DISPOSITION:  Sue Davidson will be discharged home in stable condition. Recommended that she eats a heart-healthy diet low in carbohydrate.  She has no restrictions in activity and is encouraged to continue with her exercise plans for weight loss.  She will follow up with Dr. Clarene Duke and our office will call her with an appointment time.    ______________________________ Wilburt Finlay, PA   ______________________________ Thurmon Fair, MD    BH/MEDQ  D:  07/15/2011  T:  07/15/2011  Job:  [id number]  cc:   Juline Patch, M.D. Dr.  Clarene Duke  Electronically Signed by Wilburt Finlay PA on 07/16/2011 03:09:36 PM Electronically Signed by Thurmon Fair M.D. on 07/17/2011 09:50:52 AM

## 2011-07-21 NOTE — H&P (Signed)
NAMEMARIALY, URBANCZYK NO.:  [id number]  MEDICAL RECORD NO.:  [id number]  LOCATION:  [facility]                         FACILITY:  [facility]  PHYSICIAN:  Nicki Guadalajara, M.D.     DATE OF BIRTH:  1951-03-31  DATE OF ADMISSION:  07/14/2011 DATE OF DISCHARGE:                             HISTORY & PHYSICAL   CHIEF COMPLAINTS:  Dyspnea on exertion and midscapular back pain.  HISTORY OF PRESENT ILLNESS:  Ms. Sue Davidson is a 60 year old morbidly obese female followed by Dr. Clarene Duke and Dr. Ricki Miller.  She had been cathed twice in the past, once in 2003 when she had no significant disease.  In 2008, she had a low-risk Myoview.  She was cathed again in August 2010 for chest pain and shortness of breath.  There was also a question of pulmonary hypertension, she was being followed by Dr. Shelle Iron as well. Right and left heart cath was done in August 2010 and she had a 30% RCA, but no other significant coronary artery disease.  Her right heart pressures were normal.  She does have sleep apnea and is on CPAP.  She has a strong family history of coronary artery disease as well as diabetes, hypertension, and dyslipidemia.  She has not seen Dr. Clarene Duke since 2010.  Recently, she had gained back much of the weight she had previously lost.  Her daughter had a heart attack and a stent and she encouraged her mother to go with her to the gym.  Over the last couple of weeks, she has had increasing dyspnea on exertion.  Today, she was at work and had a dizzy spell.  She has had discomfort between her shoulder blades as well.  She came to the emergency room for further evaluation. Currently, she is symptom free.  Her past medical history is remarkable for morbid obesity.  She has type 2 insulin-dependent diabetes.  She has treated dyslipidemia and treated hypertension.  She has sleep apnea and is on CPAP at home.  Her home medications are: 1. Aspirin 81 mg a day. 2. Benicar 20/12.5 daily. 3. Crestor 10 mg  a day. 4. Lasix 40 mg a day. 5. She takes Humalog 75/25, 50 units in the morning and 75 units in     the evening.  She has no known drug allergies.  SOCIAL HISTORY:  She is married.  She has 2 children, 4 grandchildren. She is nonsmoker.  She works at a [employer].  She has a remarkable family history for coronary artery disease including her daughter who had a LAD stent last year.  REVIEW OF SYSTEMS:  The patient has had some nausea for the last couple days that has been intermittent and not related to meals.  She says that she noted increasing dyspnea on exertion, i.e., walking her dog around the block.  She has also had profuse sweating at times with exertion.  PHYSICAL EXAMINATION:  VITAL SIGNS:  Blood pressure is 148/67, pulse 92, and temp 98.3. GENERAL:  She is a morbidly obese female in no acute distress. HEENT:  Normocephalic.  Extraocular movements are intact.  Sclerae are anicteric.  Lids and conjunctivae within normal limits. NECK:  Without JVD or  bruit.  Thyroid is not enlarged. CHEST:  Clear to auscultation and percussion. CARDIAC:  Regular rate and rhythm without murmur, rub, or gallop. Normal S1 and S2. ABDOMEN:  Obese, nontender, and nondistended. EXTREMITIES:  Some chronic lower extremity venous changes and 1+ edema. NEURO:  Grossly intact.  She is awake, alert, oriented, and cooperative. Moves all extremities without obvious deficit. SKIN:  Cool and dry.  LABORATORY DATA:  White count 9.8, hemoglobin 13.7, hematocrit 40.7, and platelets 265.  Sodium 139, potassium 4.0, BUN 22, creatinine 1.1, and INR 0.96.  Troponin is negative x1.  Chest x-ray shows no active disease.  EKG shows sinus rhythm with poor anterior R-wave progression.  IMPRESSION: 1. Subscapular pain, rule out cardiac. 2. Dyspnea on exertion, rule out cardiac. 3. Minor coronary artery disease at catheterization in August 2010     with a 30% right coronary artery.  No other significant disease  and     normal coronaries in 2003. 4. Good left ventricular function. 5. Morbid obesity. 6. Sleep apnea, on continuous positive airway pressure. 7. Treated hypertension. 8. Treated dyslipidemia. 9. Strong family history of coronary artery disease. 10.Type 2 insulin-dependent diabetes. 11.Chronic lower extremity edema, the patient had negative lower     extremity venous Dopplers in March 2011.  PLAN:  The patient will be admitted for observation.  We will go ahead and cycle her enzymes and have her PPI.  We will discuss with Dr. Fredirick Maudlin partner whether she would be a candidate for a Myoview or possibly a diagnostic catheterization would be better based on her overall obesity.     Abelino Derrick, P.A.   ______________________________ Nicki Guadalajara, M.D.    Lenard Lance  D:  07/14/2011  T:  07/14/2011  Job:  [id number]  Electronically Signed by Corine Shelter P.A. on 07/19/2011 12:14:12 PM Electronically Signed by Nicki Guadalajara M.D. on 07/21/2011 12:11:44 PM

## 2011-09-07 ENCOUNTER — Ambulatory Visit (HOSPITAL_COMMUNITY)
Admission: RE | Admit: 2011-09-07 | Disposition: A | Discharge status: Home / Self Care | Payer: 59 | Source: Ambulatory Visit | Admitting: Cardiology

## 2011-09-07 DIAGNOSIS — R079 Chest pain, unspecified: Secondary | ICD-10-CM | POA: Diagnosis not present

## 2011-09-07 DIAGNOSIS — Z8249 Family history of ischemic heart disease and other diseases of the circulatory system: Secondary | ICD-10-CM | POA: Insufficient documentation

## 2011-09-08 LAB — GLUCOSE, CAPILLARY: Glucose-Capillary: 126 mg/dL — ABNORMAL HIGH (ref 70–99)

## 2011-09-14 NOTE — Cardiovascular Report (Signed)
Sue Davidson, Sue Davidson                 ACCOUNT NO.:  [id number]  MEDICAL RECORD NO.:  [id number]  LOCATION:  CATH                         FACILITY:  [facility]  PHYSICIAN:  Thereasa Solo. Little, M.D. DATE OF BIRTH:  Aug 14, 1951  DATE OF PROCEDURE:  09/07/2011 DATE OF DISCHARGE:                           CARDIAC CATHETERIZATION   PROCEDURE:  Cardiac catheterization.  OPERATOR:  Thereasa Solo. Little, MD  INDICATIONS FOR TEST:  This 60 year old female has had exertional chest discomfort off and on for about 5 weeks.  She describes a pressure in her chest with occasional radiation into her upper back between her shoulder blade just associated with marked breathlessness and she has noticed a substantial increase in her fatigue.  She has a family history of heart disease with both her parents having bypass surgery.  Her mother is in her late 89s.  She is also a long-standing diabetic. Because of this, she is brought to the cath lab for outpatient cardiac catheterization. After obtaining informed consent, the patient was prepped and draped in the usual sterile fashion exposing the right groin.  Following local anesthetic with 1% Xylocaine, the Seldinger technique was employed and with the aid of a Smart needle, a 5-French introducer sheath was placed in the right femoral artery.  Left and right coronary arteriography and ventriculography in the RAO projection was performed.  COMPLICATIONS:  None.  TOTAL CONTRAST:  60 mL.  EQUIPMENT:  5-French Judkins configuration catheters.  RESULTS:  Hemodynamic monitoring:  Central aortic pressure was 122/59. Left ventricular pressure was 123/3 and there was no significant valve gradient noted at time of pullback.  Ventriculography:  Ventriculography in the RAO projection using 25 mL of contrast at 12 mL/second revealed good opacification of left ventricle. There was normal LV systolic function with no wall motion abnormality. Ejection fraction was in excess  of 55% and the end-diastolic pressure was normal at 10.  Coronary arteriography:  On fluoroscopy, there was calcification noted in the proximal LAD. 1. Left main:  Normal and bifurcated. 2. Circumflex:  The circumflex gave rise to 2 large OM vessels both of     which were free of disease.  The ongoing circumflex was small.     There was 20-30% ostial narrowing of the circumflex. 3. LAD:  The LAD crossed the apex of the heart, gave rise to a first     diagonal.  This vessel was free of disease and there was no     evidence despite the calcification of any luminal irregularities. 4. Right coronary artery:  The right coronary artery was normal.  It     gave rise to PDA and 2 small posterolateral vessels.  CONCLUSION: 1. No significant occlusive coronary disease. 2. Normal left ventricular systolic function.  I cannot explain the symptoms from a heart standpoint.  In addition to that, I checked a D-dimer which was negative.  I plan to re-evaluate her in the office on Wednesday and will probably discontinue the metoprolol that I have started empirically.          ______________________________ Thereasa Solo Little, M.D.     ABL/MEDQ  D:  09/07/2011  T:  09/07/2011  Job:  [id number]  cc:   Juline Patch, M.D. Catheterization Laboratory  Electronically Signed by Julieanne Manson M.D. on 09/14/2011 08:15:05 AM

## 2011-10-08 ENCOUNTER — Other Ambulatory Visit: Payer: Self-pay | Admitting: Gastroenterology

## 2011-10-15 ENCOUNTER — Ambulatory Visit
Admission: RE | Admit: 2011-10-15 | Discharge: 2011-10-15 | Disposition: A | Discharge status: Home / Self Care | Payer: 59 | Source: Ambulatory Visit | Attending: Gastroenterology | Admitting: Gastroenterology

## 2012-01-26 ENCOUNTER — Other Ambulatory Visit: Payer: Self-pay | Admitting: Internal Medicine

## 2012-01-26 DIAGNOSIS — Z1231 Encounter for screening mammogram for malignant neoplasm of breast: Secondary | ICD-10-CM

## 2012-02-11 ENCOUNTER — Ambulatory Visit
Admission: RE | Admit: 2012-02-11 | Discharge: 2012-02-11 | Disposition: A | Discharge status: Home / Self Care | Payer: 59 | Source: Ambulatory Visit | Attending: Internal Medicine | Admitting: Internal Medicine

## 2012-02-11 DIAGNOSIS — Z1231 Encounter for screening mammogram for malignant neoplasm of breast: Secondary | ICD-10-CM

## 2013-01-24 ENCOUNTER — Other Ambulatory Visit: Payer: Self-pay

## 2013-01-24 DIAGNOSIS — Z1231 Encounter for screening mammogram for malignant neoplasm of breast: Secondary | ICD-10-CM

## 2013-02-12 ENCOUNTER — Ambulatory Visit
Admission: RE | Admit: 2013-02-12 | Discharge: 2013-02-12 | Disposition: A | Discharge status: Home / Self Care | Payer: 59 | Source: Ambulatory Visit

## 2013-02-12 DIAGNOSIS — Z1231 Encounter for screening mammogram for malignant neoplasm of breast: Secondary | ICD-10-CM

## 2013-04-25 ENCOUNTER — Other Ambulatory Visit (HOSPITAL_COMMUNITY): Payer: Self-pay | Admitting: Internal Medicine

## 2013-04-25 DIAGNOSIS — R1011 Right upper quadrant pain: Secondary | ICD-10-CM

## 2013-05-07 ENCOUNTER — Encounter (HOSPITAL_COMMUNITY)
Admission: RE | Admit: 2013-05-07 | Discharge: 2013-05-07 | Disposition: A | Discharge status: Home / Self Care | Payer: 59 | Source: Ambulatory Visit | Attending: Internal Medicine | Admitting: Internal Medicine

## 2013-05-07 DIAGNOSIS — R1011 Right upper quadrant pain: Secondary | ICD-10-CM | POA: Insufficient documentation

## 2013-05-07 MED ORDER — SINCALIDE 5 MCG IJ SOLR
0.0200 ug/kg | Freq: Once | INTRAMUSCULAR | Status: AC
  Administered 2013-05-07: 2.46 ug via INTRAVENOUS

## 2013-05-07 MED ORDER — SINCALIDE 5 MCG IJ SOLR
INTRAMUSCULAR | Status: AC
  Administered 2013-05-07: 2.46 ug via INTRAVENOUS
  Filled 2013-05-07: qty 10

## 2013-05-07 MED ORDER — TECHNETIUM TC 99M MEBROFENIN IV KIT
5.0000 | PACK | Freq: Once | INTRAVENOUS | Status: AC | PRN
  Administered 2013-05-07: 5 via INTRAVENOUS

## 2013-05-16 ENCOUNTER — Other Ambulatory Visit (HOSPITAL_COMMUNITY): Payer: Self-pay | Admitting: Internal Medicine

## 2013-05-16 DIAGNOSIS — R112 Nausea with vomiting, unspecified: Secondary | ICD-10-CM

## 2013-05-25 ENCOUNTER — Encounter (HOSPITAL_COMMUNITY)
Admission: RE | Admit: 2013-05-25 | Discharge: 2013-05-25 | Disposition: A | Discharge status: Home / Self Care | Payer: 59 | Source: Ambulatory Visit | Attending: Internal Medicine | Admitting: Internal Medicine

## 2013-05-25 ENCOUNTER — Encounter (HOSPITAL_COMMUNITY): Payer: Self-pay

## 2013-05-25 DIAGNOSIS — R112 Nausea with vomiting, unspecified: Secondary | ICD-10-CM | POA: Insufficient documentation

## 2013-05-25 HISTORY — DX: Type 2 diabetes mellitus without complications: E11.9

## 2013-05-25 MED ORDER — TECHNETIUM TC 99M SULFUR COLLOID
2.2000 | Freq: Once | INTRAVENOUS | Status: AC | PRN
  Administered 2013-05-25: 2.2 via INTRAVENOUS

## 2013-06-18 ENCOUNTER — Emergency Department: Payer: Self-pay | Admitting: Emergency Medicine

## 2013-06-18 LAB — TROPONIN I: Troponin-I: <0.02 ng/mL

## 2013-06-18 LAB — URINALYSIS, COMPLETE
Bilirubin,UR: NEGATIVE
Blood: NEGATIVE
Glucose,UR: NEGATIVE mg/dL (ref 0–75)
Ketone: NEGATIVE
RBC,UR: <1 /HPF (ref 0–5)
Specific Gravity: 1.014 (ref 1.003–1.030)
Squamous Epithelial: 5

## 2013-06-18 LAB — COMPREHENSIVE METABOLIC PANEL
Alkaline Phosphatase: 92 U/L (ref 50–136)
Anion Gap: 3 — ABNORMAL LOW (ref 7–16)
BUN: 16 mg/dL (ref 7–18)
Calcium, Total: 9 mg/dL (ref 8.5–10.1)
Chloride: 104 mmol/L (ref 98–107)
Creatinine: 0.94 mg/dL (ref 0.60–1.30)
EGFR (African American): >60
Glucose: 159 mg/dL — ABNORMAL HIGH (ref 65–99)
Osmolality: 278 (ref 275–301)
Potassium: 4.1 mmol/L (ref 3.5–5.1)
SGOT(AST): 25 U/L (ref 15–37)
Sodium: 137 mmol/L (ref 136–145)

## 2013-06-18 LAB — CBC
HCT: 36.7 % (ref 35.0–47.0)
HGB: 12.5 g/dL (ref 12.0–16.0)
MCH: 29 pg (ref 26.0–34.0)
RDW: 15.5 % — ABNORMAL HIGH (ref 11.5–14.5)

## 2013-06-18 LAB — MAGNESIUM: Magnesium: 2 mg/dL

## 2013-08-20 ENCOUNTER — Encounter (INDEPENDENT_AMBULATORY_CARE_PROVIDER_SITE_OTHER): Payer: Self-pay | Admitting: Surgery

## 2013-08-20 ENCOUNTER — Ambulatory Visit (INDEPENDENT_AMBULATORY_CARE_PROVIDER_SITE_OTHER): Payer: 59 | Admitting: Surgery

## 2013-08-20 VITALS — BP 130/82 | HR 76 | Temp 98.5°F | Resp 15 | Ht 66.0 in | Wt 264.6 lb

## 2013-08-20 DIAGNOSIS — R1011 Right upper quadrant pain: Secondary | ICD-10-CM

## 2013-08-20 NOTE — Progress Notes (Signed)
General Surgery [facility]  Chief Complaint  Patient presents with  . New Evaluation    eval RUQ pain - referrral from Dr. Juline Patch    HISTORY: Patient is a 62 year old female referred by her primary care physician for evaluation for cholecystectomy. Patient is a greater than one-year history of right upper quadrant abdominal pain radiating to the back. Patient has experienced some nausea but no emesis. She denies fevers or chills. She denies jaundice or acholic stools. She notes the pain is frequently worse at night. Pain is exacerbated by food. Symptoms have been worse over the past 3 months.  Diagnostic studies include laboratory work which is normal. She has undergone an ultrasound which does not show cholelithiasis. Patient had a hepatobiliary scan performed which is also normal with a normal ejection fraction.  Patient is an insulin-dependent diabetic. Family history is notable for cholecystectomy in the patient's brother.  Patient has had appendectomy, bilateral tubal ligation, and total abdominal hysterectomy.  Past Medical History  Diagnosis Date  . Diabetes mellitus without complication   . Heart murmur   . Hyperlipidemia   . Hypertension     Current Outpatient Prescriptions  Medication Sig Dispense Refill  . aspirin 81 MG tablet Take 81 mg by mouth daily.      Marland Kitchen BENICAR 40 MG tablet       . CRESTOR 20 MG tablet       . escitalopram (LEXAPRO) 10 MG tablet       . furosemide (LASIX) 40 MG tablet       . HUMALOG MIX 75/25 (75-25) 100 UNIT/ML SUSP injection       . omeprazole (PRILOSEC) 40 MG capsule       . ONE TOUCH ULTRA TEST test strip       . Vitamin D, Ergocalciferol, (DRISDOL) 50000 UNITS CAPS capsule        No current facility-administered medications for this visit.    Not on File  Family History  Problem Relation Age of Onset  . Diabetes Mother   . Heart disease Mother   . Cancer Father     throat and lung  . Cancer Paternal  Aunt     breast  . Cancer Paternal Aunt     colon    History   Social History  . Marital Status: Married    Spouse Name: N/A    Number of Children: N/A  . Years of Education: N/A   Social History Main Topics  . Smoking status: Never Smoker   . Smokeless tobacco: Never Used  . Alcohol Use: No  . Drug Use: No  . Sexual Activity: Yes    Birth Control/ Protection: Spermicide   Other Topics Concern  . None   Social History Narrative  . None    REVIEW OF SYSTEMS - PERTINENT POSITIVES ONLY: Intermittent right upper quadrant abdominal pain radiating to the back. Intermittent nausea. Food intolerance.  EXAM: Filed Vitals:   08/20/13 1529  BP: 130/82  Pulse: 76  Temp: 98.5 F (36.9 C)  Resp: 15    HEENT: normocephalic; pupils equal and reactive; sclerae clear; dentition good; mucous membranes moist NECK:  No palpable masses in the thyroid bed; symmetric on extension; no palpable anterior or posterior cervical lymphadenopathy; no supraclavicular masses; no tenderness CHEST: clear to auscultation bilaterally without rales, rhonchi, or wheezes CARDIAC: regular rate and rhythm without significant murmur; peripheral pulses are full ABDOMEN: soft without distension; bowel sounds present; no mass; no  hepatosplenomegaly; no hernia; mild tenderness to deep palpation right upper quadrant; no Murphy sign EXT:  non-tender without edema; no deformity NEURO: no gross focal deficits; no sign of tremor   LABORATORY RESULTS: See [facility] (CHL-Epic) for most recent results  RADIOLOGY RESULTS: See [facility] (CHL-Epic) for most recent results  IMPRESSION: Right upper quadrant abdominal pain concerning for biliary colic  PLAN: Patient and I discussed the above findings and studies at length. I provided her with written literature to review regarding laparoscopic cholecystectomy. Patient appears to have symptoms of biliary colic despite normal testing. She is an  insulin-dependent diabetic.  Patient and I discussed laparoscopic cholecystectomy at length. We discussed potential complications including the possibility of conversion to open surgery. We discussed performing intraoperative cholangiography. We discussed the hospital stay to be anticipated and her recovery at home and return to work.  I explained to the patient that it was not absolutely necessary for her to have surgery and we could continue to observe her and indication further diagnostic studies. I explained that the chances of complete symptom relief with surgery or in the range of 50-60% and that there was a 40-50% chance of persistent symptoms following the procedure.  Patient understands the above issues and wishes to proceed with surgery in the near future.  The risks and benefits of the procedure have been discussed at length with the patient.  The patient understands the proposed procedure, potential alternative treatments, and the course of recovery to be expected.  All of the patient's questions have been answered at this time.  The patient wishes to proceed with surgery.  Velora Heckler, MD, FACS General & Endocrine Surgery [facility]  Primary Care Physician: Juline Patch, MD

## 2013-08-20 NOTE — Patient Instructions (Signed)
  [facility]  LAPAROSCOPIC SURGERY - POST-OP INSTRUCTIONS  Always review your discharge instruction sheet given to you by the facility where your surgery was performed.  A prescription for pain medication may be given to you upon discharge.  Take your pain medication as prescribed.  If narcotic pain medicine is not needed, then you may take acetaminophen (Tylenol) or ibuprofen (Advil) as needed.  Take your usually prescribed medications unless otherwise directed.  If you need a refill on your pain medication, please contact your pharmacy.  They will contact our office to request authorization. Prescriptions will not be filled after 5 P.M. or on weekends.  You should follow a light diet the first few days after arrival home, such as soup and crackers or toast.  Be sure to include plenty of fluids daily.  Most patients will experience some swelling and bruising in the area of the incisions.  Ice packs will help.  Swelling and bruising can take several days to resolve.   It is common to experience some constipation if taking pain medication after surgery.  Increasing fluid intake and taking a stool softener (such as Colace) will usually help or prevent this problem from occurring.  A mild laxative (Milk of Magnesia or Miralax) should be taken according to package instructions if there are no bowel movements after 48 hours.  Unless discharge instructions indicate otherwise, you may remove your bandages 24-48 hours after surgery, and you may shower at that time.  You may have steri-strips (small skin tapes) in place directly over the incision.  These strips should be left on the skin for 7-10 days.  If your surgeon used skin glue on the incision, you may shower in 24 hours.  The glue will flake off over the next 2-3 weeks.  Any sutures or staples will be removed at the office during your follow-up visit.  ACTIVITIES:  You may resume regular (light) daily activities beginning the  next day-such as daily self-care, walking, climbing stairs-gradually increasing activities as tolerated.  You may have sexual intercourse when it is comfortable.  Refrain from any heavy lifting or straining until approved by your doctor.  You may drive when you are no longer taking prescription pain medication, you can comfortably wear a seatbelt, and you can safely maneuver your car and apply brakes.  You should see your doctor in the office for a follow-up appointment approximately 2-3 weeks after your surgery.  Make sure that you call for this appointment within a day or two after you arrive home to insure a convenient appointment time.  WHEN TO CALL YOUR DOCTOR: 1. Fever over 101.0 2. Inability to urinate 3. Continued bleeding from incision 4. Increased pain, redness, or drainage from the incision 5. Increasing abdominal pain  The clinic staff is available to answer your questions during regular business hours.  Please don't hesitate to call and ask to speak to one of the nurses for clinical concerns.  If you have a medical emergency, go to the nearest emergency room or call 911.  A surgeon from [facility] is always on call for the hospital.  Myli Pae M. Anilah Huck, MD, FACS [facility] Office: [phone] Toll Free:  [phone] FAX [phone]  Web site: [email or web address] 

## 2013-08-27 ENCOUNTER — Encounter (INDEPENDENT_AMBULATORY_CARE_PROVIDER_SITE_OTHER): Payer: Self-pay

## 2013-09-21 ENCOUNTER — Encounter (HOSPITAL_COMMUNITY): Payer: Self-pay | Admitting: Pharmacy Technician

## 2013-09-24 ENCOUNTER — Ambulatory Visit (HOSPITAL_COMMUNITY)
Admission: RE | Admit: 2013-09-24 | Discharge: 2013-09-24 | Disposition: A | Discharge status: Home / Self Care | Payer: 59 | Source: Ambulatory Visit | Attending: Surgery | Admitting: Surgery

## 2013-09-24 ENCOUNTER — Encounter (HOSPITAL_COMMUNITY)
Admission: RE | Admit: 2013-09-24 | Discharge: 2013-09-24 | Disposition: A | Discharge status: Home / Self Care | Payer: 59 | Source: Ambulatory Visit | Attending: Surgery | Admitting: Surgery

## 2013-09-24 ENCOUNTER — Encounter (HOSPITAL_COMMUNITY): Payer: Self-pay

## 2013-09-24 DIAGNOSIS — R109 Unspecified abdominal pain: Secondary | ICD-10-CM | POA: Insufficient documentation

## 2013-09-24 DIAGNOSIS — E119 Type 2 diabetes mellitus without complications: Secondary | ICD-10-CM | POA: Insufficient documentation

## 2013-09-24 DIAGNOSIS — Z01812 Encounter for preprocedural laboratory examination: Secondary | ICD-10-CM | POA: Insufficient documentation

## 2013-09-24 DIAGNOSIS — Z01810 Encounter for preprocedural cardiovascular examination: Secondary | ICD-10-CM | POA: Insufficient documentation

## 2013-09-24 DIAGNOSIS — I10 Essential (primary) hypertension: Secondary | ICD-10-CM | POA: Insufficient documentation

## 2013-09-24 DIAGNOSIS — Z01818 Encounter for other preprocedural examination: Secondary | ICD-10-CM | POA: Insufficient documentation

## 2013-09-24 HISTORY — DX: Anxiety disorder, unspecified: F41.9

## 2013-09-24 HISTORY — DX: Headache: R51

## 2013-09-24 HISTORY — DX: Nausea with vomiting, unspecified: R11.2

## 2013-09-24 HISTORY — DX: Vitamin D deficiency, unspecified: E55.9

## 2013-09-24 HISTORY — DX: Major depressive disorder, single episode, unspecified: F32.9

## 2013-09-24 HISTORY — DX: Anemia, unspecified: D64.9

## 2013-09-24 HISTORY — DX: Depression, unspecified: F32.A

## 2013-09-24 HISTORY — DX: Angina pectoris, unspecified: I20.9

## 2013-09-24 HISTORY — DX: Other specified postprocedural states: Z98.890

## 2013-09-24 HISTORY — DX: Sleep apnea, unspecified: G47.30

## 2013-09-24 HISTORY — DX: Gastro-esophageal reflux disease without esophagitis: K21.9

## 2013-09-24 LAB — BASIC METABOLIC PANEL
BUN: 19 mg/dL (ref 6–23)
CO2: 30 mEq/L (ref 19–32)
Chloride: 104 mEq/L (ref 96–112)
Creatinine, Ser: 0.95 mg/dL (ref 0.50–1.10)
GFR calc Af Amer: 72 mL/min — ABNORMAL LOW (ref >90)
Glucose, Bld: 77 mg/dL (ref 70–99)
Potassium: 4.6 mEq/L (ref 3.5–5.1)
Sodium: 143 mEq/L (ref 135–145)

## 2013-09-24 LAB — CBC
HCT: 37.8 % (ref 36.0–46.0)
Hemoglobin: 11.9 g/dL — ABNORMAL LOW (ref 12.0–15.0)
RBC: 4.28 MIL/uL (ref 3.87–5.11)
RDW: 15.3 % (ref 11.5–15.5)
WBC: 8.6 10*3/uL (ref 4.0–10.5)

## 2013-09-24 NOTE — Patient Instructions (Signed)
20 Lisaann Atha  09/24/2013   Your procedure is scheduled on: 10/02/13  Report to [facility] at 5:30 AM.  Call this number if you have problems the morning of surgery 336-: [phone]   Remember:   Do not eat food or drink liquids After Midnight.     Take these medicines the morning of surgery with A SIP OF WATER: prilosec, crestor   Do not wear jewelry, make-up or nail polish.  Do not wear lotions, powders, or perfumes. You may wear deodorant.  Do not shave 48 hours prior to surgery. Men may shave face and neck.  Do not bring valuables to the hospital.  Contacts, dentures or bridgework may not be worn into surgery.  Leave suitcase in the car. After surgery it may be brought to your room.  For patients admitted to the hospital, checkout time is 11:00 AM the day of discharge.   Birdie Sons, RN  pre op nurse call if needed [phone]    FAILURE TO FOLLOW THESE INSTRUCTIONS MAY RESULT IN CANCELLATION OF YOUR SURGERY   Patient Signature: ___________________________________________

## 2013-09-24 NOTE — Progress Notes (Addendum)
LOV note Dr. Clarene Duke 09/08/11 on chart, ECHO 2010 on chart, EKG 2012 on chart- will repeat, stress test 2008 on chart, cardiac catheterization 2012 on chart, EKG 06/18/13 on chart

## 2013-09-24 NOTE — Progress Notes (Signed)
Quick Note:  These results are acceptable for scheduled surgery.  Jemia Fata M. Kazim Corrales, MD, FACS [facility] Office: [phone]   ______ 

## 2013-10-01 MED ORDER — DEXTROSE 5 % IV SOLN
3.0000 g | INTRAVENOUS | Status: AC
  Administered 2013-10-02: 3 g via INTRAVENOUS
  Filled 2013-10-01: qty 3000

## 2013-10-01 NOTE — Anesthesia Preprocedure Evaluation (Addendum)
Anesthesia Evaluation  Patient identified by MRN, date of birth, ID band Patient awake    Reviewed: Allergy & Precautions, H&P , NPO status , Patient's Chart, lab work & pertinent test results  History of Anesthesia Complications (+) PONV  Airway Mallampati: II TM Distance: >3 FB Neck ROM: full    Dental  (+) Edentulous Upper and Edentulous Lower   Pulmonary shortness of breath and with exertion, sleep apnea ,  breath sounds clear to auscultation  Pulmonary exam normal       Cardiovascular Exercise Tolerance: Good hypertension, Pt. on medications negative cardio ROS  Rhythm:regular Rate:Normal  ICRBBB/ LAFB   Neuro/Psych negative neurological ROS  negative psych ROS   GI/Hepatic negative GI ROS, Neg liver ROS, GERD-  Medicated and Controlled,  Endo/Other  diabetes, Well Controlled, Type 2, Insulin DependentMorbid obesity  Renal/GU negative Renal ROS  negative genitourinary   Musculoskeletal   Abdominal (+) + obese,   Peds  Hematology negative hematology ROS (+) anemia ,   Anesthesia Other Findings   Reproductive/Obstetrics negative OB ROS                         Anesthesia Physical Anesthesia Plan  ASA: III  Anesthesia Plan: General   Post-op Pain Management:    Induction: Intravenous  Airway Management Planned: Oral ETT  Additional Equipment:   Intra-op Plan:   Post-operative Plan: Extubation in OR  Informed Consent: I have reviewed the patients History and Physical, chart, labs and discussed the procedure including the risks, benefits and alternatives for the proposed anesthesia with the patient or authorized representative who has indicated his/her understanding and acceptance.   Dental Advisory Given  Plan Discussed with: CRNA and Surgeon  Anesthesia Plan Comments:         Anesthesia Quick Evaluation

## 2013-10-02 ENCOUNTER — Ambulatory Visit (HOSPITAL_COMMUNITY): Payer: 59 | Admitting: Anesthesiology

## 2013-10-02 ENCOUNTER — Encounter (HOSPITAL_COMMUNITY)
Admission: RE | Disposition: A | Discharge status: Home / Self Care | Payer: Self-pay | Source: Ambulatory Visit | Attending: Surgery

## 2013-10-02 ENCOUNTER — Ambulatory Visit (HOSPITAL_COMMUNITY): Payer: 59

## 2013-10-02 ENCOUNTER — Observation Stay (HOSPITAL_COMMUNITY)
Admission: RE | Admit: 2013-10-02 | Discharge: 2013-10-03 | Disposition: A | Discharge status: Home / Self Care | Payer: 59 | Source: Ambulatory Visit | Attending: Surgery | Admitting: Surgery

## 2013-10-02 ENCOUNTER — Encounter (HOSPITAL_COMMUNITY): Payer: Self-pay | Admitting: *Deleted

## 2013-10-02 ENCOUNTER — Encounter (HOSPITAL_COMMUNITY): Payer: 59 | Admitting: Anesthesiology

## 2013-10-02 DIAGNOSIS — E119 Type 2 diabetes mellitus without complications: Secondary | ICD-10-CM | POA: Insufficient documentation

## 2013-10-02 DIAGNOSIS — Z794 Long term (current) use of insulin: Secondary | ICD-10-CM | POA: Insufficient documentation

## 2013-10-02 DIAGNOSIS — Z7982 Long term (current) use of aspirin: Secondary | ICD-10-CM | POA: Insufficient documentation

## 2013-10-02 DIAGNOSIS — R1011 Right upper quadrant pain: Secondary | ICD-10-CM

## 2013-10-02 DIAGNOSIS — K811 Chronic cholecystitis: Principal | ICD-10-CM | POA: Insufficient documentation

## 2013-10-02 DIAGNOSIS — K219 Gastro-esophageal reflux disease without esophagitis: Secondary | ICD-10-CM | POA: Insufficient documentation

## 2013-10-02 DIAGNOSIS — I10 Essential (primary) hypertension: Secondary | ICD-10-CM | POA: Insufficient documentation

## 2013-10-02 DIAGNOSIS — E785 Hyperlipidemia, unspecified: Secondary | ICD-10-CM | POA: Insufficient documentation

## 2013-10-02 DIAGNOSIS — G4730 Sleep apnea, unspecified: Secondary | ICD-10-CM | POA: Insufficient documentation

## 2013-10-02 HISTORY — PX: CHOLECYSTECTOMY: SHX55

## 2013-10-02 LAB — GLUCOSE, CAPILLARY
Glucose-Capillary: 158 mg/dL — ABNORMAL HIGH (ref 70–99)
Glucose-Capillary: 195 mg/dL — ABNORMAL HIGH (ref 70–99)
Glucose-Capillary: 304 mg/dL — ABNORMAL HIGH (ref 70–99)

## 2013-10-02 SURGERY — LAPAROSCOPIC CHOLECYSTECTOMY WITH INTRAOPERATIVE CHOLANGIOGRAM
Anesthesia: General | Site: Abdomen | Wound class: Clean Contaminated

## 2013-10-02 MED ORDER — NEOSTIGMINE METHYLSULFATE 1 MG/ML IJ SOLN
INTRAMUSCULAR | Status: DC | PRN
  Administered 2013-10-02: 4 mg via INTRAVENOUS

## 2013-10-02 MED ORDER — CISATRACURIUM BESYLATE (PF) 10 MG/5ML IV SOLN
INTRAVENOUS | Status: DC | PRN
  Administered 2013-10-02: 5 mg via INTRAVENOUS

## 2013-10-02 MED ORDER — ACETAMINOPHEN 325 MG PO TABS: 650.0000 mg | ORAL_TABLET | ORAL | Status: DC | PRN

## 2013-10-02 MED ORDER — ONDANSETRON HCL 4 MG/2ML IJ SOLN
INTRAMUSCULAR | Status: AC
  Filled 2013-10-02: qty 2

## 2013-10-02 MED ORDER — LACTATED RINGERS IV SOLN: INTRAVENOUS | Status: DC

## 2013-10-02 MED ORDER — DEXAMETHASONE SODIUM PHOSPHATE 10 MG/ML IJ SOLN
INTRAMUSCULAR | Status: DC | PRN
  Administered 2013-10-02: 10 mg via INTRAVENOUS

## 2013-10-02 MED ORDER — LACTATED RINGERS IR SOLN
Status: DC | PRN
  Administered 2013-10-02: 1000 mL

## 2013-10-02 MED ORDER — SODIUM CHLORIDE 0.9 % IJ SOLN
INTRAMUSCULAR | Status: AC
  Filled 2013-10-02: qty 10

## 2013-10-02 MED ORDER — SUFENTANIL CITRATE 50 MCG/ML IV SOLN
INTRAVENOUS | Status: DC | PRN
  Administered 2013-10-02: 20 ug via INTRAVENOUS
  Administered 2013-10-02: 10 ug via INTRAVENOUS

## 2013-10-02 MED ORDER — ONDANSETRON HCL 4 MG/2ML IJ SOLN
INTRAMUSCULAR | Status: DC | PRN
  Administered 2013-10-02: 4 mg via INTRAVENOUS

## 2013-10-02 MED ORDER — ONDANSETRON HCL 4 MG/2ML IJ SOLN
4.0000 mg | Freq: Four times a day (QID) | INTRAMUSCULAR | Status: DC | PRN
  Administered 2013-10-02: 4 mg via INTRAVENOUS
  Filled 2013-10-02: qty 2

## 2013-10-02 MED ORDER — HYDROMORPHONE HCL PF 1 MG/ML IJ SOLN: 0.2500 mg | INTRAMUSCULAR | Status: DC | PRN

## 2013-10-02 MED ORDER — HYDROMORPHONE HCL PF 1 MG/ML IJ SOLN: 1.0000 mg | INTRAMUSCULAR | Status: DC | PRN

## 2013-10-02 MED ORDER — INSULIN ASPART 100 UNIT/ML ~~LOC~~ SOLN
0.0000 [IU] | Freq: Three times a day (TID) | SUBCUTANEOUS | Status: DC
  Administered 2013-10-02: 15 [IU] via SUBCUTANEOUS
  Administered 2013-10-02: 4 [IU] via SUBCUTANEOUS
  Administered 2013-10-03: 15 [IU] via SUBCUTANEOUS

## 2013-10-02 MED ORDER — DEXAMETHASONE SODIUM PHOSPHATE 10 MG/ML IJ SOLN
INTRAMUSCULAR | Status: AC
  Filled 2013-10-02: qty 1

## 2013-10-02 MED ORDER — SUCCINYLCHOLINE CHLORIDE 20 MG/ML IJ SOLN
INTRAMUSCULAR | Status: DC | PRN
  Administered 2013-10-02: 100 mg via INTRAVENOUS

## 2013-10-02 MED ORDER — EPHEDRINE SULFATE 50 MG/ML IJ SOLN
INTRAMUSCULAR | Status: DC | PRN
  Administered 2013-10-02: 10 mg via INTRAVENOUS
  Administered 2013-10-02: 5 mg via INTRAVENOUS

## 2013-10-02 MED ORDER — IRBESARTAN 75 MG PO TABS
75.0000 mg | ORAL_TABLET | Freq: Every day | ORAL | Status: DC
  Administered 2013-10-02 – 2013-10-03 (×2): 75 mg via ORAL
  Filled 2013-10-02 (×2): qty 1

## 2013-10-02 MED ORDER — PANTOPRAZOLE SODIUM 40 MG PO TBEC
40.0000 mg | DELAYED_RELEASE_TABLET | Freq: Every day | ORAL | Status: DC
  Administered 2013-10-02 – 2013-10-03 (×2): 40 mg via ORAL
  Filled 2013-10-02 (×2): qty 1

## 2013-10-02 MED ORDER — LIDOCAINE HCL (CARDIAC) 20 MG/ML IV SOLN
INTRAVENOUS | Status: DC | PRN
  Administered 2013-10-02: 100 mg via INTRAVENOUS

## 2013-10-02 MED ORDER — GLYCOPYRROLATE 0.2 MG/ML IJ SOLN
INTRAMUSCULAR | Status: AC
  Filled 2013-10-02: qty 3

## 2013-10-02 MED ORDER — GLYCOPYRROLATE 0.2 MG/ML IJ SOLN
INTRAMUSCULAR | Status: DC | PRN
  Administered 2013-10-02: .6 mg via INTRAVENOUS

## 2013-10-02 MED ORDER — HYDROCODONE-ACETAMINOPHEN 5-325 MG PO TABS
1.0000 | ORAL_TABLET | ORAL | Status: DC | PRN
  Administered 2013-10-02 (×2): 1 via ORAL
  Filled 2013-10-02 (×2): qty 1

## 2013-10-02 MED ORDER — MIDAZOLAM HCL 2 MG/2ML IJ SOLN
INTRAMUSCULAR | Status: AC
  Filled 2013-10-02: qty 2

## 2013-10-02 MED ORDER — KCL IN DEXTROSE-NACL 20-5-0.45 MEQ/L-%-% IV SOLN
INTRAVENOUS | Status: DC
  Administered 2013-10-02 – 2013-10-03 (×2): via INTRAVENOUS
  Filled 2013-10-02 (×2): qty 1000

## 2013-10-02 MED ORDER — INSULIN ASPART 100 UNIT/ML ~~LOC~~ SOLN: 0.0000 [IU] | SUBCUTANEOUS | Status: DC

## 2013-10-02 MED ORDER — INSULIN ASPART 100 UNIT/ML ~~LOC~~ SOLN
0.0000 [IU] | Freq: Every day | SUBCUTANEOUS | Status: DC
Start: 2013-10-02 — End: 2013-10-03
  Administered 2013-10-02: 5 [IU] via SUBCUTANEOUS

## 2013-10-02 MED ORDER — LIDOCAINE HCL (CARDIAC) 20 MG/ML IV SOLN
INTRAVENOUS | Status: AC
  Filled 2013-10-02: qty 5

## 2013-10-02 MED ORDER — CISATRACURIUM BESYLATE 20 MG/10ML IV SOLN
INTRAVENOUS | Status: AC
  Filled 2013-10-02: qty 10

## 2013-10-02 MED ORDER — 0.9 % SODIUM CHLORIDE (POUR BTL) OPTIME
TOPICAL | Status: DC | PRN
  Administered 2013-10-02: 1000 mL

## 2013-10-02 MED ORDER — BIOTENE DRY MOUTH MT LIQD
15.0000 mL | Freq: Two times a day (BID) | OROMUCOSAL | Status: DC
  Administered 2013-10-02 – 2013-10-03 (×3): 15 mL via OROMUCOSAL

## 2013-10-02 MED ORDER — MIDAZOLAM HCL 5 MG/5ML IJ SOLN
INTRAMUSCULAR | Status: DC | PRN
  Administered 2013-10-02 (×2): 1 mg via INTRAVENOUS

## 2013-10-02 MED ORDER — PROPOFOL 10 MG/ML IV BOLUS
INTRAVENOUS | Status: DC | PRN
  Administered 2013-10-02: 200 mg via INTRAVENOUS

## 2013-10-02 MED ORDER — ONDANSETRON HCL 4 MG PO TABS: 4.0000 mg | ORAL_TABLET | Freq: Four times a day (QID) | ORAL | Status: DC | PRN

## 2013-10-02 MED ORDER — ESCITALOPRAM OXALATE 20 MG PO TABS
20.0000 mg | ORAL_TABLET | Freq: Every day | ORAL | Status: DC
  Administered 2013-10-02: 20 mg via ORAL
  Filled 2013-10-02 (×2): qty 1

## 2013-10-02 MED ORDER — NEOSTIGMINE METHYLSULFATE 1 MG/ML IJ SOLN
INTRAMUSCULAR | Status: AC
  Filled 2013-10-02: qty 10

## 2013-10-02 MED ORDER — BUPIVACAINE-EPINEPHRINE PF 0.5-1:200000 % IJ SOLN
INTRAMUSCULAR | Status: AC
  Filled 2013-10-02: qty 30

## 2013-10-02 MED ORDER — LACTATED RINGERS IV SOLN
INTRAVENOUS | Status: DC | PRN
  Administered 2013-10-02 (×2): via INTRAVENOUS

## 2013-10-02 MED ORDER — PROPOFOL 10 MG/ML IV BOLUS
INTRAVENOUS | Status: AC
  Filled 2013-10-02: qty 20

## 2013-10-02 MED ORDER — SUFENTANIL CITRATE 50 MCG/ML IV SOLN
INTRAVENOUS | Status: AC
  Filled 2013-10-02: qty 1

## 2013-10-02 MED ORDER — SUCCINYLCHOLINE CHLORIDE 20 MG/ML IJ SOLN
INTRAMUSCULAR | Status: AC
  Filled 2013-10-02: qty 1

## 2013-10-02 MED ORDER — EPHEDRINE SULFATE 50 MG/ML IJ SOLN
INTRAMUSCULAR | Status: AC
  Filled 2013-10-02: qty 1

## 2013-10-02 MED ORDER — BUPIVACAINE-EPINEPHRINE 0.5% -1:200000 IJ SOLN
INTRAMUSCULAR | Status: DC | PRN
  Administered 2013-10-02: 15 mL

## 2013-10-02 SURGICAL SUPPLY — 33 items
APPLIER CLIP ROT 10 11.4 M/L (STAPLE) ×2
BENZOIN TINCTURE PRP APPL 2/3 (GAUZE/BANDAGES/DRESSINGS) ×2 IMPLANT
CABLE HIGH FREQUENCY MONO STRZ (ELECTRODE) ×2 IMPLANT
CANISTER SUCTION 2500CC (MISCELLANEOUS) ×2 IMPLANT
CHLORAPREP W/TINT 26ML (MISCELLANEOUS) ×2 IMPLANT
CLIP APPLIE ROT 10 11.4 M/L (STAPLE) ×1 IMPLANT
COVER MAYO STAND STRL (DRAPES) ×2 IMPLANT
DECANTER SPIKE VIAL GLASS SM (MISCELLANEOUS) ×2 IMPLANT
DRAPE C-ARM 42X120 X-RAY (DRAPES) ×2 IMPLANT
DRAPE LAPAROSCOPIC ABDOMINAL (DRAPES) ×2 IMPLANT
DRAPE UTILITY XL STRL (DRAPES) ×2 IMPLANT
ELECT REM PT RETURN 9FT ADLT (ELECTROSURGICAL) ×2
ELECTRODE REM PT RTRN 9FT ADLT (ELECTROSURGICAL) ×1 IMPLANT
GLOVE SURG ORTHO 8.0 STRL STRW (GLOVE) ×2 IMPLANT
GOWN STRL REIN XL XLG (GOWN DISPOSABLE) ×4 IMPLANT
HEMOSTAT SURGICEL 4X8 (HEMOSTASIS) IMPLANT
KIT BASIN OR (CUSTOM PROCEDURE TRAY) ×2 IMPLANT
NS IRRIG 1000ML POUR BTL (IV SOLUTION) ×2 IMPLANT
POUCH SPECIMEN RETRIEVAL 10MM (ENDOMECHANICALS) ×2 IMPLANT
SCISSORS LAP 5X35 DISP (ENDOMECHANICALS) ×2 IMPLANT
SET CHOLANGIOGRAPH MIX (MISCELLANEOUS) ×2 IMPLANT
SET IRRIG TUBING LAPAROSCOPIC (IRRIGATION / IRRIGATOR) ×2 IMPLANT
SLEEVE XCEL OPT CAN 5 100 (ENDOMECHANICALS) ×2 IMPLANT
SOLUTION ANTI FOG 6CC (MISCELLANEOUS) ×2 IMPLANT
STRIP CLOSURE SKIN 1/2X4 (GAUZE/BANDAGES/DRESSINGS) ×2 IMPLANT
SUT MNCRL AB 4-0 PS2 18 (SUTURE) ×2 IMPLANT
TOWEL OR 17X26 10 PK STRL BLUE (TOWEL DISPOSABLE) ×2 IMPLANT
TOWEL OR NON WOVEN STRL DISP B (DISPOSABLE) ×2 IMPLANT
TRAY LAP CHOLE (CUSTOM PROCEDURE TRAY) ×2 IMPLANT
TROCAR BLADELESS OPT 5 100 (ENDOMECHANICALS) ×2 IMPLANT
TROCAR XCEL BLUNT TIP 100MML (ENDOMECHANICALS) ×2 IMPLANT
TROCAR XCEL NON-BLD 11X100MML (ENDOMECHANICALS) ×2 IMPLANT
TUBING INSUFFLATION 10FT LAP (TUBING) ×2 IMPLANT

## 2013-10-02 NOTE — Op Note (Signed)
Procedure Note  Pre-operative Diagnosis:  Biliary dyskinesia, abdominal pain  Post-operative Diagnosis:  same  Surgeon:  Velora Heckler, MD, FACS  Assistant:  none   Procedure:  Laparoscopic cholecystectomy  Anesthesia:  General  Estimated Blood Loss:  minimal  Drains: none         Specimen: Gallbladder to pathology  Indications:  Patient is a 62 year old female referred by her primary care physician for evaluation for cholecystectomy. Patient is a greater than one-year history of right upper quadrant abdominal pain radiating to the back. Patient has experienced some nausea but no emesis. She denies fevers or chills. She denies jaundice or acholic stools. She notes the pain is frequently worse at night. Pain is exacerbated by food. Symptoms have been worse over the past 3 months. Diagnostic studies include laboratory work which is normal. She has undergone an ultrasound which does not show cholelithiasis. Patient had a hepatobiliary scan performed which is also normal with a normal ejection fraction.   Procedure Details:  The patient was seen in the pre-op holding area. The risks, benefits, complications, treatment options, and expected outcomes have been discussed with the patient. The patient and/or family agreed with the proposed plan and signed the informed consent form.  The patient was taken to Operating Room, identified as Sue Davidson and the procedure verified as Laparoscopic Cholecystectomy with Intraoperative Cholangiogram. A "time out" was completed and the above information confirmed.  Following induction of general anesthesia, the patient was placed in the supine position. The abdomen was prepped and draped in the usual aseptic fashion.  An incision was made in the skin below the umbilicus. The midline fascia was incised and the peritoneal cavity entered and the Hasson canula was introduced under direct vision.  The Hasson canula was secured with a 0-Vicryl pursestring  suture. Pneumoperitoneum was established with carbon dioxide. Additional trocars were introduced under direct vision along the right costal margin in the midline, mid-clavicular line, and anterior axillary line.   The gallbladder was identified and the fundus grasped and retracted cephalad. Adhesions were taken down bluntly and with the electrocautery as needed, taking care not to injure any adjacent structures. The infundibulum was grasped and retracted laterally, exposing the peritoneum overlying the triangle of Calot. This was incised and structures exposed in a blunt fashion. The cystic duct was clearly identified and bluntly dissected circumferentially and clipped at the neck of the gallbladder.  The cystic duct was then triply ligated with surgical clips and divided. The cystic artery was identified, dissected circumferentially, ligated with ligaclips, and divided.  The gallbladder was dissected from the liver bed with the electrocautery used for hemostasis. The gallbladder was completely removed and placed into an endocatch bag. The right upper quadrant was irrigated and inspected. Hemostasis was achieved with the electrocautery. Warm saline irrigation was utilized and was repeatedly aspirated until clear.  Pneumoperitoneum was released after viewing removal of the trocars with good hemostasis noted. The umbilical wound was irrigated and the fascia was then closed with the pursestring suture.  The skin incisions were closed with 4-0 Monocril subcuticular sutures and steri-strips and dressings were applied.  Instrument, sponge, and needle counts were correct at the conclusion of the case.  The patient was awakened from anesthesia and brought to the recovery room in stable condition.  The patient tolerated the procedure well.   Velora Heckler, MD, [facility] Office: [phone]

## 2013-10-02 NOTE — Anesthesia Procedure Notes (Signed)
Procedure Name: Intubation Date/Time: 10/02/2013 7:43 AM Performed by: Leroy Libman L Patient Re-evaluated:Patient Re-evaluated prior to inductionOxygen Delivery Method: Circle system utilized Preoxygenation: Pre-oxygenation with 100% oxygen Intubation Type: IV induction Ventilation: Mask ventilation without difficulty and Oral airway inserted - appropriate to patient size Laryngoscope Size: Hyacinth Meeker and 2 Grade View: Grade I Tube type: Oral Tube size: 7.5 mm Number of attempts: 1 Airway Equipment and Method: Stylet Placement Confirmation: ETT inserted through vocal cords under direct vision,  breath sounds checked- equal and bilateral and positive ETCO2 Secured at: 22 cm Tube secured with: Tape Dental Injury: Teeth and Oropharynx as per pre-operative assessment

## 2013-10-02 NOTE — Transfer of Care (Signed)
Immediate Anesthesia Transfer of Care Note  Patient: Sue Davidson  Procedure(s) Performed: Procedure(s): LAPAROSCOPIC CHOLECYSTECTOMY (N/A)  Patient Location: PACU  Anesthesia Type:General  Level of Consciousness: awake, alert  and oriented  Airway & Oxygen Therapy: Patient Spontanous Breathing and Patient connected to face mask oxygen  Post-op Assessment: Report given to PACU RN and Post -op Vital signs reviewed and stable  Post vital signs: Reviewed and stable  Complications: No apparent anesthesia complications

## 2013-10-02 NOTE — Preoperative (Signed)
Beta Blockers   Reason not to administer Beta Blockers:Not Applicable 

## 2013-10-02 NOTE — H&P (Signed)
Sue Davidson is an 62 y.o. female.    General Surgery [facility]  Chief Complaint: abdominal pain, nausea  eval RUQ pain - referrral from Dr. Juline Patch   HPI: Patient is a 62 year old female referred by her primary care physician for evaluation for cholecystectomy. Patient is a greater than one-year history of right upper quadrant abdominal pain radiating to the back. Patient has experienced some nausea but no emesis. She denies fevers or chills. She denies jaundice or acholic stools. She notes the pain is frequently worse at night. Pain is exacerbated by food. Symptoms have been worse over the past 3 months.  Diagnostic studies include laboratory work which is normal. She has undergone an ultrasound which does not show cholelithiasis. Patient had a hepatobiliary scan performed which is also normal with a normal ejection fraction.  Patient is an insulin-dependent diabetic. Family history is notable for cholecystectomy in the patient's brother.  Patient has had appendectomy, bilateral tubal ligation, and total abdominal hysterectomy.   Past Medical History  Diagnosis Date  . Diabetes mellitus without complication   . Heart murmur   . Hyperlipidemia   . Hypertension   . Anginal pain     hx of  . Anxiety   . Depression   . GERD (gastroesophageal reflux disease)   . Headache(784.0)   . Anemia     hx of  . Vitamin D deficiency   . PONV (postoperative nausea and vomiting)     severe  . Sleep apnea     Past Surgical History  Procedure Laterality Date  . Leg surgery Left 1980's    broke leg and ankle  . Appendectomy  1970's  . Tubal ligation  1970's  . Tonsillectomy  1970's  . Abdominal hysterectomy  1970's  . Cardiac catheterization    . Hardware removal Left     ankle  . Elbow surgery Left   . Shoulder arthroscopy Bilateral   . Cyst removed Left     wrist    Family History  Problem Relation Age of Onset  . Diabetes Mother   . Heart disease  Mother   . Cancer Father     throat and lung  . Cancer Paternal Aunt     breast  . Cancer Paternal Aunt     colon   Social History:  reports that she has never smoked. She has never used smokeless tobacco. She reports that she does not drink alcohol or use illicit drugs.  Allergies: No Known Allergies  Medications Prior to Admission  Medication Sig Dispense Refill  . aspirin 81 MG tablet Take 81 mg by mouth daily.      Marland Kitchen escitalopram (LEXAPRO) 20 MG tablet Take 20 mg by mouth at bedtime.      . furosemide (LASIX) 40 MG tablet Take 40-80 mg by mouth daily. Take two tablets every other day      . HUMALOG MIX 75/25 (75-25) 100 UNIT/ML SUSP injection Inject 10-85 Units into the skin 3 (three) times daily.       . naproxen sodium (ANAPROX) 220 MG tablet Take 440 mg by mouth daily as needed (pain).      Marland Kitchen olmesartan (BENICAR) 40 MG tablet Take 20 mg by mouth daily with breakfast.      . omeprazole (PRILOSEC) 40 MG capsule Take 40 mg by mouth daily.       . ondansetron (ZOFRAN) 8 MG tablet Take 8 mg by mouth 2 (two) times daily.      Marland Kitchen  ONE TOUCH ULTRA TEST test strip       . rosuvastatin (CRESTOR) 20 MG tablet Take 10 mg by mouth daily with breakfast.      . Vitamin D, Ergocalciferol, (DRISDOL) 50000 UNITS CAPS capsule Take 50,000 Units by mouth every 7 (seven) days.         Results for orders placed during the hospital encounter of 10/02/13 (from the past 48 hour(s))  GLUCOSE, CAPILLARY     Status: Abnormal   Collection Time    10/02/13  6:22 AM      Result Value Range   Glucose-Capillary 139 (*) 70 - 99 mg/dL   No results found.  Review of Systems  Constitutional: Negative.   HENT: Negative.   Eyes: Negative.   Respiratory: Negative.   Cardiovascular: Negative.   Gastrointestinal: Positive for nausea, vomiting and abdominal pain.  Genitourinary: Negative.   Musculoskeletal: Negative.   Skin: Negative.   Neurological: Negative.   Endo/Heme/Allergies: Negative.    Psychiatric/Behavioral: Negative.     Blood pressure 157/63, pulse 89, temperature 97.3 F (36.3 C), temperature source Oral, resp. rate 18, SpO2 97.00%. Physical Exam  Constitutional: She is oriented to person, place, and time. She appears well-developed and well-nourished. No distress.  HENT:  Head: Normocephalic and atraumatic.  Right Ear: External ear normal.  Left Ear: External ear normal.  Eyes: Conjunctivae are normal. Pupils are equal, round, and reactive to light. No scleral icterus.  Neck: Normal range of motion. Neck supple. No thyromegaly present.  Cardiovascular: Normal rate, regular rhythm and normal heart sounds.   No murmur heard. Respiratory: Effort normal and breath sounds normal. She has no wheezes.  GI: Soft. Bowel sounds are normal. She exhibits no distension and no mass. There is no tenderness. There is no rebound and no guarding.  Musculoskeletal: Normal range of motion. She exhibits no edema.  Neurological: She is alert and oriented to person, place, and time.  Skin: Skin is warm and dry.  Psychiatric: She has a normal mood and affect. Her behavior is normal.     Assessment/Plan Biliary dyskinesia, abdominal pain  Plan lap chole with IOC  Velora Heckler, MD, [facility] Office: [phone]    Mckenley Birenbaum Judie Petit 10/02/2013, 7:25 AM

## 2013-10-02 NOTE — Anesthesia Postprocedure Evaluation (Signed)
  Anesthesia Post-op Note  Patient: Sue Davidson  Procedure(s) Performed: Procedure(s) (LRB): LAPAROSCOPIC CHOLECYSTECTOMY (N/A)  Patient Location: PACU  Anesthesia Type: General  Level of Consciousness: awake and alert   Airway and Oxygen Therapy: Patient Spontanous Breathing  Post-op Pain: mild  Post-op Assessment: Post-op Vital signs reviewed, Patient's Cardiovascular Status Stable, Respiratory Function Stable, Patent Airway and No signs of Nausea or vomiting  Last Vitals:  Filed Vitals:   10/02/13 1018  BP: 147/76  Pulse: 82  Temp: 36.4 C  Resp: 16    Post-op Vital Signs: stable   Complications: No apparent anesthesia complications

## 2013-10-03 ENCOUNTER — Encounter (HOSPITAL_COMMUNITY): Payer: Self-pay | Admitting: Surgery

## 2013-10-03 MED ORDER — OXYCODONE-ACETAMINOPHEN 5-325 MG PO TABS: 1.0000 | ORAL_TABLET | ORAL | Status: DC | PRN

## 2013-10-03 NOTE — Progress Notes (Signed)
1 Day Post-Op  Subjective: Up in chair, feels good tolerated diet well and wants to go home.  She says she's had no pain with this so far.  Objective: Vital signs in last 24 hours: Temp:  [97.8 F (36.6 C)-98.1 F (36.7 C)] 98.1 F (36.7 C) (11/26 0600) Pulse Rate:  [73-90] 73 (11/26 0600) Resp:  [18] 18 (11/26 0600) BP: (111-155)/(62-73) 143/73 mmHg (11/26 0600) SpO2:  [94 %-95 %] 94 % (11/26 0600) Weight:  [122.925 kg (271 lb)] 122.925 kg (271 lb) (11/25 1038) Last BM Date: 10/01/13 Afebrile, VSS Glucose up to 353 this Am Diet:  Carb mod, low Na. Intake/Output from previous day: 11/25 0701 - 11/26 0700 In: 2899.2 [P.O.:580; I.V.:2319.2] Out: 1900 [Urine:1850; Blood:50] Intake/Output this shift: Total I/O In: 440 [P.O.:240; I.V.:200] Out: 0   General appearance: alert, cooperative and no distress GI: soft not even very sore, +BS tolerating diet. Port sites look fine  Lab Results:  No results found for this basename: WBC, HGB, HCT, PLT,  in the last 72 hours  BMET No results found for this basename: NA, K, CL, CO2, GLUCOSE, BUN, CREATININE, CALCIUM,  in the last 72 hours PT/INR No results found for this basename: LABPROT, INR,  in the last 72 hours  No results found for this basename: AST, ALT, ALKPHOS, BILITOT, PROT, ALBUMIN,  in the last 168 hours   Lipase     Component Value Date/Time   LIPASE 39 07/14/2011 1550     Studies/Results: No results found.  Medications: . antiseptic oral rinse  15 mL Mouth Rinse BID  . escitalopram  20 mg Oral QHS  . insulin aspart  0-20 Units Subcutaneous TID WC  . insulin aspart  0-5 Units Subcutaneous QHS  . irbesartan  75 mg Oral Daily  . pantoprazole  40 mg Oral Daily   Prior to Admission medications   Medication Sig Start Date End Date Taking? Authorizing Provider  aspirin 81 MG tablet Take 81 mg by mouth daily.   Yes Historical Provider, MD  escitalopram (LEXAPRO) 20 MG tablet Take 20 mg by mouth at bedtime.   Yes  Historical Provider, MD  furosemide (LASIX) 40 MG tablet Take 40-80 mg by mouth daily. Take two tablets every other day 08/11/13  Yes Historical Provider, MD  HUMALOG MIX 75/25 (75-25) 100 UNIT/ML SUSP injection Inject 10-85 Units into the skin 3 (three) times daily.  08/15/13  Yes Historical Provider, MD  naproxen sodium (ANAPROX) 220 MG tablet Take 440 mg by mouth daily as needed (pain).   Yes Historical Provider, MD  olmesartan (BENICAR) 40 MG tablet Take 20 mg by mouth daily with breakfast.   Yes Historical Provider, MD  omeprazole (PRILOSEC) 40 MG capsule Take 40 mg by mouth daily.  07/18/13  Yes Historical Provider, MD  ondansetron (ZOFRAN) 8 MG tablet Take 8 mg by mouth 2 (two) times daily.   Yes Historical Provider, MD  ONE TOUCH ULTRA TEST test strip  07/29/13  Yes Historical Provider, MD  rosuvastatin (CRESTOR) 20 MG tablet Take 10 mg by mouth daily with breakfast.   Yes Historical Provider, MD  Vitamin D, Ergocalciferol, (DRISDOL) 50000 UNITS CAPS capsule Take 50,000 Units by mouth every 7 (seven) days.  08/06/13  Yes Historical Provider, MD  oxyCODONE-acetaminophen (ROXICET) 5-325 MG per tablet Take 1-2 tablets by mouth every 4 (four) hours as needed for moderate pain. 10/03/13   Velora Heckler, MD     Assessment/Plan Biliary dyskinesia, abdominal pain S/p Laparoscopic cholecystectomy,  10/02/13, Dr. Darnell Level AODM Hyperlipidemia GERD Anxiety/depression Sleep apnea Body mass index is 43.7  Plan:  Home today on home medicines follow up with Dr. Gerrit Friends 2-3 weeks.    LOS: 1 day    Kristeena Meineke 10/03/2013

## 2013-10-03 NOTE — Care Management Note (Signed)
    Page 1 of 1   10/03/2013     10:23:25 AM   CARE MANAGEMENT NOTE 10/03/2013  Patient:  Sue Davidson   Account Number:  [id number]  Date Initiated:  10/03/2013  Documentation initiated by:  Lorenda Ishihara  Subjective/Objective Assessment:   62 yo female admitted with abd pain and nausea. PTA lived at home with spouse.     Action/Plan:   Home when stable   Anticipated DC Date:  10/03/2013   Anticipated DC Plan:  HOME/SELF CARE      DC Planning Services  CM consult      Choice offered to / List presented to:             Status of service:  Completed, signed off Medicare Important Message given?  NA - LOS <3 / Initial given by admissions (If response is "NO", the following Medicare IM given date fields will be blank) Date Medicare IM given:   Date Additional Medicare IM given:    Discharge Disposition:  HOME/SELF CARE  Per UR Regulation:  Reviewed for med. necessity/level of care/duration of stay  If discussed at Long Length of Stay Meetings, dates discussed:    Comments:

## 2013-10-03 NOTE — Progress Notes (Signed)
Discharge instructions reviewed with patient and spouse, patient is to follow up with MD Gerkin within 3 weeks, incisions are within normal limits, patient is tolerating her diet without complaints of nausea or vomiting, IV removed, questions and concerns answered Stanford Breed RN 10-03-2013 11:36am

## 2013-10-04 NOTE — Discharge Summary (Signed)
Physician Discharge Summary [facility]  Patient ID: Sue Davidson MRN: [id number] DOB/AGE: Jul 12, 1951 62 y.o.  Admit date: 10/02/2013 Discharge date: 10/04/2013  Admission Diagnoses:  Biliary dyskinesia, abdominal pain  Discharge Diagnoses:  Principal Problem:   Abdominal pain, right upper quadrant   Discharged Condition: good  Hospital Course: patient admitted for observation after lap chole.  Post op course uncomplicated.  Tolerated diet.  Ambulatory.  Pain controlled.  Prepared for discharge on POD#1.  Consults: None  Significant Diagnostic Studies: none  Treatments: surgery: lap cholecystectomy  Discharge Exam: Blood pressure 143/73, pulse 73, temperature 98.1 F (36.7 C), temperature source Oral, resp. rate 18, height 5\' 6"  (1.676 m), weight 271 lb (122.925 kg), SpO2 94.00%. See progress note by Mr. Marlyne Beards at time of discharge.  Disposition: Home with family  Discharge Orders   Future Appointments Provider Department Dept Phone   10/22/2013 9:30 AM Velora Heckler, MD [facility], [place] [phone]   Future Orders Complete By Expires   Diet - low sodium heart healthy  As directed    Discharge instructions  As directed    Comments:     [facility]  LAPAROSCOPIC SURGERY - POST-OP INSTRUCTIONS  Always review your discharge instruction sheet given to you by the facility where your surgery was performed.  A prescription for pain medication may be given to you upon discharge.  Take your pain medication as prescribed.  If narcotic pain medicine is not needed, then you may take acetaminophen (Tylenol) or ibuprofen (Advil) as needed.  Take your usually prescribed medications unless otherwise directed.  If you need a refill on your pain medication, please contact your pharmacy.  They will contact our office to request authorization. Prescriptions will not be filled after 5 P.M. or on weekends.  You should follow a light  diet the first few days after arrival home, such as soup and crackers or toast.  Be sure to include plenty of fluids daily.  Most patients will experience some swelling and bruising in the area of the incisions.  Ice packs will help.  Swelling and bruising can take several days to resolve.   It is common to experience some constipation if taking pain medication after surgery.  Increasing fluid intake and taking a stool softener (such as Colace) will usually help or prevent this problem from occurring.  A mild laxative (Milk of Magnesia or Miralax) should be taken according to package instructions if there are no bowel movements after 48 hours.  Unless discharge instructions indicate otherwise, you may remove your bandages 24-48 hours after surgery, and you may shower at that time.  You may have steri-strips (small skin tapes) in place directly over the incision.  These strips should be left on the skin for 7-10 days.  If your surgeon used skin glue on the incision, you may shower in 24 hours.  The glue will flake off over the next 2-3 weeks.  Any sutures or staples will be removed at the office during your follow-up visit.  ACTIVITIES:  You may resume regular (light) daily activities beginning the next day-such as daily self-care, walking, climbing stairs-gradually increasing activities as tolerated.  You may have sexual intercourse when it is comfortable.  Refrain from any heavy lifting or straining until approved by your doctor.  You may drive when you are no longer taking prescription pain medication, you can comfortably wear a seatbelt, and you can safely maneuver your car and apply brakes.  You should  see your doctor in the office for a follow-up appointment approximately 2-3 weeks after your surgery.  Make sure that you call for this appointment within a day or two after you arrive home to insure a convenient appointment time.  WHEN TO CALL YOUR DOCTOR: Fever over 101.0 Inability to  urinate Continued bleeding from incision Increased pain, redness, or drainage from the incision Increasing abdominal pain  The clinic staff is available to answer your questions during regular business hours.  Please don't hesitate to call and ask to speak to one of the nurses for clinical concerns.  If you have a medical emergency, go to the nearest emergency room or call 911.  A surgeon from [facility] is always on call for the hospital.  Velora Heckler, MD, [facility] Office: [phone] Toll Free:  [phone] FAX [phone]  Web site: [email or web address]   Increase activity slowly  As directed    Remove dressing in 24 hours  As directed        Medication List         aspirin 81 MG tablet  Take 81 mg by mouth daily.     escitalopram 20 MG tablet  Commonly known as:  LEXAPRO  Take 20 mg by mouth at bedtime.     furosemide 40 MG tablet  Commonly known as:  LASIX  Take 40-80 mg by mouth daily. Take two tablets every other day     HUMALOG MIX 75/25 (75-25) 100 UNIT/ML Susp injection  Generic drug:  insulin lispro protamine-lispro  Inject 10-85 Units into the skin 3 (three) times daily.     naproxen sodium 220 MG tablet  Commonly known as:  ANAPROX  Take 440 mg by mouth daily as needed (pain).     olmesartan 40 MG tablet  Commonly known as:  BENICAR  Take 20 mg by mouth daily with breakfast.     omeprazole 40 MG capsule  Commonly known as:  PRILOSEC  Take 40 mg by mouth daily.     ondansetron 8 MG tablet  Commonly known as:  ZOFRAN  Take 8 mg by mouth 2 (two) times daily.     ONE TOUCH ULTRA TEST test strip  Generic drug:  glucose blood     oxyCODONE-acetaminophen 5-325 MG per tablet  Commonly known as:  ROXICET  Take 1-2 tablets by mouth every 4 (four) hours as needed for moderate pain.     rosuvastatin 20 MG tablet  Commonly known as:  CRESTOR  Take 10 mg by mouth daily with breakfast.      Vitamin D (Ergocalciferol) 50000 UNITS Caps capsule  Commonly known as:  DRISDOL  Take 50,000 Units by mouth every 7 (seven) days.           Follow-up Information   Follow up with Velora Heckler, MD. Schedule an appointment as soon as possible for a visit in 3 weeks.   Specialty:  General Surgery   Contact information:   [address] [place] [address] [phone]       Velora Heckler, MD, [facility] Office: [phone]   Signed: Velora Heckler 10/04/2013, 7:28 AM

## 2013-10-04 NOTE — Progress Notes (Signed)
General Surgery [facility]  Agree with attached note.  Velora Heckler, MD, [facility] Office: [phone]

## 2013-10-22 ENCOUNTER — Encounter (INDEPENDENT_AMBULATORY_CARE_PROVIDER_SITE_OTHER): Payer: 59 | Admitting: Surgery

## 2013-10-24 ENCOUNTER — Telehealth (INDEPENDENT_AMBULATORY_CARE_PROVIDER_SITE_OTHER): Payer: Self-pay | Admitting: *Deleted

## 2013-10-24 NOTE — Telephone Encounter (Signed)
Left message for patient to call back.  Wanted to ask patient if she could be here at 2p for a 225p appt tomorrow instead of 350p.  Awaiting patient to call back at this time.

## 2013-10-25 ENCOUNTER — Ambulatory Visit (INDEPENDENT_AMBULATORY_CARE_PROVIDER_SITE_OTHER): Payer: 59 | Admitting: Surgery

## 2013-10-25 ENCOUNTER — Encounter (INDEPENDENT_AMBULATORY_CARE_PROVIDER_SITE_OTHER): Payer: Self-pay | Admitting: Surgery

## 2013-10-25 VITALS — BP 154/66 | HR 72 | Temp 97.2°F | Resp 20 | Ht 66.0 in | Wt 268.0 lb

## 2013-10-25 DIAGNOSIS — K811 Chronic cholecystitis: Secondary | ICD-10-CM

## 2013-10-25 NOTE — Patient Instructions (Signed)
  COCOA BUTTER & VITAMIN E CREAM  ([employer]mer[employer] or other brand)  Apply cocoa butter/vitamin E cream to your incision 2 - 3 times daily.  Massage cream into incision for one minute with each application.  Use sunscreen (50 SPF or higher) for first 6 months after surgery if area is exposed to sun.  You may substitute Mederma or other scar reducing creams as desired.   

## 2013-10-25 NOTE — Progress Notes (Signed)
General Surgery [facility]  Chief Complaint  Patient presents with  . Routine Post Op    post op cholecystectomy 10/02/2013    HISTORY: The patient is a 62 year old female who underwent laparoscopic cholecystectomy on 10/02/2013. Final pathology shows chronic cholecystitis. Postoperative course has been uneventful.  EXAM: Abdomen is soft, nontender without distention. Surgical wounds are well-healed. No sign of herniation. No sign of infection. Right upper quadrant is soft and nontender without mass.  IMPRESSION: Status post laparoscopic cholecystectomy for chronic cholecystitis  PLAN: Patient is doing well. She is symptomatically much improved. She is anxious to return to her physical activities. At this point she is released to full activity without restriction. She will begin applying topical creams to her incisions.  Patient will return for surgical care as needed.  Velora Heckler, MD, FACS General & Endocrine Surgery [facility]   Visit Diagnoses: 1. Chronic cholecystitis

## 2014-01-15 ENCOUNTER — Other Ambulatory Visit: Payer: Self-pay

## 2014-01-15 DIAGNOSIS — Z1231 Encounter for screening mammogram for malignant neoplasm of breast: Secondary | ICD-10-CM

## 2014-02-14 ENCOUNTER — Ambulatory Visit
Admission: RE | Admit: 2014-02-14 | Discharge: 2014-02-14 | Disposition: A | Discharge status: Home / Self Care | Payer: 59 | Source: Ambulatory Visit

## 2014-02-14 DIAGNOSIS — Z1231 Encounter for screening mammogram for malignant neoplasm of breast: Secondary | ICD-10-CM

## 2015-01-29 ENCOUNTER — Other Ambulatory Visit: Payer: Self-pay

## 2015-01-29 DIAGNOSIS — Z1231 Encounter for screening mammogram for malignant neoplasm of breast: Secondary | ICD-10-CM

## 2015-02-19 ENCOUNTER — Ambulatory Visit
Admission: RE | Admit: 2015-02-19 | Discharge: 2015-02-19 | Disposition: A | Discharge status: Home / Self Care | Payer: 59 | Source: Ambulatory Visit

## 2015-02-19 DIAGNOSIS — Z1231 Encounter for screening mammogram for malignant neoplasm of breast: Secondary | ICD-10-CM

## 2015-10-09 ENCOUNTER — Encounter (HOSPITAL_COMMUNITY): Payer: Self-pay

## 2015-10-09 ENCOUNTER — Emergency Department (HOSPITAL_BASED_OUTPATIENT_CLINIC_OR_DEPARTMENT_OTHER): Payer: 59

## 2015-10-09 ENCOUNTER — Emergency Department (HOSPITAL_COMMUNITY)
Admission: EM | Admit: 2015-10-09 | Discharge: 2015-10-09 | Disposition: A | Discharge status: Home / Self Care | Payer: 59 | Attending: Emergency Medicine | Admitting: Emergency Medicine

## 2015-10-09 DIAGNOSIS — Z791 Long term (current) use of non-steroidal anti-inflammatories (NSAID): Secondary | ICD-10-CM | POA: Insufficient documentation

## 2015-10-09 DIAGNOSIS — R2242 Localized swelling, mass and lump, left lower limb: Secondary | ICD-10-CM | POA: Insufficient documentation

## 2015-10-09 DIAGNOSIS — Z862 Personal history of diseases of the blood and blood-forming organs and certain disorders involving the immune mechanism: Secondary | ICD-10-CM | POA: Diagnosis not present

## 2015-10-09 DIAGNOSIS — Z7982 Long term (current) use of aspirin: Secondary | ICD-10-CM | POA: Insufficient documentation

## 2015-10-09 DIAGNOSIS — K219 Gastro-esophageal reflux disease without esophagitis: Secondary | ICD-10-CM | POA: Insufficient documentation

## 2015-10-09 DIAGNOSIS — Z79899 Other long term (current) drug therapy: Secondary | ICD-10-CM | POA: Insufficient documentation

## 2015-10-09 DIAGNOSIS — Z8669 Personal history of other diseases of the nervous system and sense organs: Secondary | ICD-10-CM | POA: Diagnosis not present

## 2015-10-09 DIAGNOSIS — M7989 Other specified soft tissue disorders: Secondary | ICD-10-CM

## 2015-10-09 DIAGNOSIS — E785 Hyperlipidemia, unspecified: Secondary | ICD-10-CM | POA: Insufficient documentation

## 2015-10-09 DIAGNOSIS — Z8614 Personal history of Methicillin resistant Staphylococcus aureus infection: Secondary | ICD-10-CM | POA: Diagnosis not present

## 2015-10-09 DIAGNOSIS — E119 Type 2 diabetes mellitus without complications: Secondary | ICD-10-CM | POA: Insufficient documentation

## 2015-10-09 DIAGNOSIS — Z8659 Personal history of other mental and behavioral disorders: Secondary | ICD-10-CM | POA: Diagnosis not present

## 2015-10-09 DIAGNOSIS — R011 Cardiac murmur, unspecified: Secondary | ICD-10-CM | POA: Diagnosis not present

## 2015-10-09 DIAGNOSIS — I10 Essential (primary) hypertension: Secondary | ICD-10-CM | POA: Diagnosis not present

## 2015-10-09 HISTORY — DX: Carrier or suspected carrier of methicillin resistant Staphylococcus aureus: Z22.322

## 2015-10-09 MED ORDER — ENOXAPARIN SODIUM 120 MG/0.8ML ~~LOC~~ SOLN
120.0000 mg | Freq: Once | SUBCUTANEOUS | Status: AC
  Administered 2015-10-09: 120 mg via SUBCUTANEOUS
  Filled 2015-10-09: qty 0.8

## 2015-10-09 MED ORDER — ENOXAPARIN SODIUM 100 MG/ML ~~LOC~~ SOLN
121.0000 mg | Freq: Two times a day (BID) | SUBCUTANEOUS | Status: DC
Start: 2015-10-09 — End: 2016-04-06

## 2015-10-09 NOTE — Discharge Instructions (Signed)
1. Medications: lovenox, usual home medications 2. Treatment: rest, drink plenty of fluids,  3. Follow Up: Please followup with your primary doctor in tomorrow for discussion of your diagnoses and further evaluation after today's visit; if you do not have a primary care doctor use the resource guide provided to find one; Please return to the ER for fevers, chills, increased redness, increased swelling or development of pain in the lower leg

## 2015-10-09 NOTE — ED Provider Notes (Signed)
CSN: [id number]     Arrival date & time 10/09/15  1724 History   First MD Initiated Contact with Patient 10/09/15 1753     Chief Complaint  Patient presents with  . Leg Swelling     (Consider location/radiation/quality/duration/timing/severity/associated sxs/prior Treatment) The history is provided by the patient, a parent and medical records. No language interpreter was used.     Sue Davidson is a 64 y.o. female  with a hx of insulin dependent diabetes, hypertension, anxiety, depression, GERD, anemia, MRSA presents to the Emergency Department complaining of gradual, persistent, progressively worsening left leg swelling and mild erythema onset yesterday. Patient reports she intermittently has swelling in her bilateral lower extremities for which she takes Lasix. Patient reports that she traveled from November 23 to November 26 in the car. She also reports decreased ambulation at work in the last week. She denies a history of DVT, exogenous estrogen. She reports that during the time of her travel she was not taking her Lasix as directed but did begin taking it after returning home. She reports that persistent swelling in her left leg is unusual as her peripheral edema usually resolves by morning. She denies fever, chills, headache, neck pain, chest pain Shortness of breath, abdominal pain, nausea, vomiting, diarrhea, weakness, dizziness, syncope, dysuria.  He denies problems with her blood sugar in the last several days.     Past Medical History  Diagnosis Date  . Diabetes mellitus without complication ([place])   . Heart murmur   . Hyperlipidemia   . Hypertension   . Anginal pain (HCC)     hx of  . Anxiety   . Depression   . GERD (gastroesophageal reflux disease)   . Headache(784.0)   . Anemia     hx of  . Vitamin D deficiency   . PONV (postoperative nausea and vomiting)     severe  . Sleep apnea   . MRSA (methicillin resistant staph aureus) culture positive    Past Surgical History   Procedure Laterality Date  . Leg surgery Left 1980's    broke leg and ankle  . Appendectomy  1970's  . Tubal ligation  1970's  . Tonsillectomy  1970's  . Abdominal hysterectomy  1970's  . Cardiac catheterization    . Hardware removal Left     ankle  . Elbow surgery Left   . Shoulder arthroscopy Bilateral   . Cyst removed Left     wrist  . Cholecystectomy N/A 10/02/2013    Procedure: LAPAROSCOPIC CHOLECYSTECTOMY;  Surgeon: Earnstine Regal, MD;  Location: [facility];  Service: General;  Laterality: N/A;   Family History  Problem Relation Age of Onset  . Diabetes Mother   . Heart disease Mother   . Cancer Father     throat and lung  . Cancer Paternal Aunt     breast  . Cancer Paternal Aunt     colon   Social History  Substance Use Topics  . Smoking status: Never Smoker   . Smokeless tobacco: Never Used  . Alcohol Use: No   OB History    No data available     Review of Systems  Constitutional: Negative for fever, diaphoresis, appetite change, fatigue and unexpected weight change.  HENT: Negative for mouth sores.   Eyes: Negative for visual disturbance.  Respiratory: Negative for cough, chest tightness, shortness of breath and wheezing.   Cardiovascular: Positive for leg swelling. Negative for chest pain.  Gastrointestinal: Negative for nausea, vomiting, abdominal pain, diarrhea  and constipation.  Endocrine: Negative for polydipsia, polyphagia and polyuria.  Genitourinary: Negative for dysuria, urgency, frequency and hematuria.  Musculoskeletal: Negative for back pain and neck stiffness.  Skin: Positive for color change. Negative for rash.  Allergic/Immunologic: Negative for immunocompromised state.  Neurological: Negative for syncope, light-headedness and headaches.  Hematological: Does not bruise/bleed easily.  Psychiatric/Behavioral: Negative for sleep disturbance. The patient is not nervous/anxious.       Allergies  Review of patient's allergies indicates no known  allergies.  Home Medications   Prior to Admission medications   Medication Sig Start Date End Date Taking? Authorizing Provider  aspirin 81 MG tablet Take 81 mg by mouth daily.   Yes Historical Provider, MD  furosemide (LASIX) 40 MG tablet Take 40-80 mg by mouth daily. Take two tablets every other day 08/11/13  Yes Historical Provider, MD  HUMALOG MIX 75/25 (75-25) 100 UNIT/ML SUSP injection Inject into the skin 3 (three) times daily. 65 units every morning, 20 units at lunch, and 85-90 units at bedtime. 08/15/13  Yes Historical Provider, MD  ibuprofen (ADVIL,MOTRIN) 200 MG tablet Take 200 mg by mouth at bedtime.   Yes Historical Provider, MD  olmesartan (BENICAR) 40 MG tablet Take 20 mg by mouth daily with breakfast.   Yes Historical Provider, MD  omeprazole (PRILOSEC) 40 MG capsule Take 40 mg by mouth daily.  07/18/13  Yes Historical Provider, MD  ONE TOUCH ULTRA TEST test strip  07/29/13  Yes Historical Provider, MD  rosuvastatin (CRESTOR) 20 MG tablet Take 10 mg by mouth daily with breakfast.   Yes Historical Provider, MD   BP 189/59 mmHg  Pulse 86  Temp(Src) 98.2 F (36.8 C) (Oral)  Resp 18  SpO2 100% Physical Exam  Constitutional: She appears well-developed and well-nourished. No distress.  Awake, alert, nontoxic appearance  HENT:  Head: Normocephalic and atraumatic.  Mouth/Throat: Oropharynx is clear and moist. No oropharyngeal exudate.  Eyes: Conjunctivae are normal. No scleral icterus.  Neck: Normal range of motion. Neck supple.  Cardiovascular: Normal rate, regular rhythm, normal heart sounds and intact distal pulses.   No murmur heard. Pulmonary/Chest: Effort normal and breath sounds normal. No respiratory distress. She has no wheezes.  Equal chest expansion  Abdominal: Soft. Bowel sounds are normal. She exhibits no mass. There is no tenderness. There is no rebound and no guarding.  Musculoskeletal: Normal range of motion. She exhibits tenderness. She exhibits no edema.   Full range of motion of the left hip, left knee, left ankle and all toes of the left foot Swelling noted to the left calf area from just distal to the knee to just proximal to the ankle with increased warmth No pitting edema of the left lower leg No open wounds, skin changes or significant erythema Minimal tenderness to palpation of the posterior calf  Neurological: She is alert.  Speech is clear and goal oriented Moves extremities without ataxia Sensation intact to the left lower extremity Drink 5/5 in the left lower extremity  Skin: Skin is warm and dry. She is not diaphoretic. There is erythema (minimal).  Psychiatric: She has a normal mood and affect.  Nursing note and vitals reviewed.   ED Course  Procedures (including critical care time) Labs Review Labs Reviewed - No data to display  Imaging Review No results found. I have personally reviewed and evaluated these images and lab results as part of my medical decision-making.   EKG Interpretation None      MDM   Final diagnoses:  Left leg swelling   Docia Magnifico presents with swelling to the left lower leg. Leg is swollen and warm but not erythematous mild tenderness to palpation to the posterior calf.  Questionable peroneal DVT on venous duplex and clinical concern for same.  Will begin Lovenox and patient will need repeat venous duplex in approximately one week.  No signs of cellulitis at this time; minimal erythema noted.  Patient is afebrile, non-tachycardic and without hypotension.  Patient also without chest pain, shortness of breath or tachycardia. Doubt PE at this time. Recommend the patient follow-up with her primary care physician tomorrow morning for recheck of leg.  She is to return to the emergency department for further evaluation if she develops redness, increased swelling or begins to have pain.     BP 189/59 mmHg  Pulse 86  Temp(Src) 98.2 F (36.8 C) (Oral)  Resp 18  SpO2 100%   Abigail Butts,  PA-C 10/09/15 2032  Tanna Furry, MD 10/22/15 [phone]

## 2015-10-09 NOTE — ED Notes (Signed)
Patient reports that she saw her PCP today for left leg swelling and redness. Patient was sent here to r/o DVT.patient denies any SOB.

## 2015-10-09 NOTE — Progress Notes (Signed)
Preliminary results by tech - Left Lower Ext. Venous Duplex Completed. Negative for deep and superficial vein thrombosis in th vein that were clearly visualized. The peroneal veins were visualized, DVT can not be excluded at this level. Results given to nurse. Oda Cogan, BS, RDMS, RVT

## 2016-01-01 ENCOUNTER — Ambulatory Visit: Payer: 59 | Attending: Physician Assistant | Admitting: Physical Therapy

## 2016-01-01 ENCOUNTER — Encounter: Payer: Self-pay | Admitting: Physical Therapy

## 2016-01-01 DIAGNOSIS — M25552 Pain in left hip: Secondary | ICD-10-CM | POA: Diagnosis present

## 2016-01-01 DIAGNOSIS — R269 Unspecified abnormalities of gait and mobility: Secondary | ICD-10-CM | POA: Insufficient documentation

## 2016-01-01 DIAGNOSIS — M25562 Pain in left knee: Secondary | ICD-10-CM | POA: Diagnosis present

## 2016-01-01 DIAGNOSIS — M25561 Pain in right knee: Secondary | ICD-10-CM | POA: Diagnosis present

## 2016-01-01 DIAGNOSIS — M6289 Other specified disorders of muscle: Secondary | ICD-10-CM | POA: Diagnosis present

## 2016-01-01 DIAGNOSIS — R29898 Other symptoms and signs involving the musculoskeletal system: Secondary | ICD-10-CM

## 2016-01-01 NOTE — Therapy (Signed)
[facility] [address]  [place], [place], [address] Phone: [phone]   Fax:  [phone]  Physical Therapy Evaluation  Patient Details  Name: Sue Davidson MRN: [id number] Date of Birth: September 27, 1951 Referring Provider: Pete Pelt PA-C  Encounter Date: 01/01/2016      PT End of Session - 01/01/16 0945    Visit Number 1   Number of Visits 12   Date for PT Re-Evaluation 02/12/16   PT Start Time 0943   PT Stop Time 1028   PT Time Calculation (min) 45 min      Past Medical History  Diagnosis Date  . Diabetes mellitus without complication ([place])   . Heart murmur   . Hyperlipidemia   . Hypertension   . Anginal pain (HCC)     hx of  . Anxiety   . Depression   . GERD (gastroesophageal reflux disease)   . Headache(784.0)   . Anemia     hx of  . Vitamin D deficiency   . PONV (postoperative nausea and vomiting)     severe  . Sleep apnea   . MRSA (methicillin resistant staph aureus) culture positive     Past Surgical History  Procedure Laterality Date  . Leg surgery Left 1980's    broke leg and ankle  . Appendectomy  1970's  . Tubal ligation  1970's  . Tonsillectomy  1970's  . Abdominal hysterectomy  1970's  . Cardiac catheterization    . Hardware removal Left     ankle  . Elbow surgery Left   . Shoulder arthroscopy Bilateral   . Cyst removed Left     wrist  . Cholecystectomy N/A 10/02/2013    Procedure: LAPAROSCOPIC CHOLECYSTECTOMY;  Surgeon: Earnstine Regal, MD;  Location: [facility];  Service: General;  Laterality: N/A;    There were no vitals filed for this visit.  Visit Diagnosis:  Knee pain, bilateral  Hip pain, left  Weakness of both hips  Abnormality of gait      Subjective Assessment - 01/01/16 0945    Subjective pt with c/o B LE pain stating L LE pain has been present past 5 months and R LE past few weeks.  She states L pain is posterior thigh from knee to hip.  R LE pain is noted  with driving and is noted lateral knee to mid lateral lower leg.  Denies buckling/locking of knees.  States does not require use of AD with ambulation.   Patient Stated Goals decrease pain to allow more exercise and wt loss   Currently in Pain? Yes   Pain Score --  4-5/10 on AVG, up to 8-9/10 at worst   Pain Location Leg   Pain Orientation Left;Upper;Posterior  back of L thigh   Pain Descriptors / Indicators Aching   Pain Radiating Towards posterior L knee along posterior thigh to hip/buttock   Pain Onset More than a month ago   Pain Frequency Intermittent   Aggravating Factors  pain worst at night; increases with prolonged sitting (noted with sit->stand transfer after sitting)   Pain Relieving Factors unknown   Multiple Pain Sites Yes   Pain Score --  up to 8/10 with driving more than several minutes   Pain Location Knee   Pain Orientation Right;Lateral   Pain Descriptors / Indicators Throbbing   Pain Onset More than a month ago   Pain Frequency Intermittent   Aggravating Factors  driving   Pain Relieving  Factors massage while driving            [facility] PT Assessment - 01/01/16 0001    Assessment   Medical Diagnosis B Knee Pain   Referring Provider Pete Pelt PA-C   Onset Date/Surgical Date 07/10/15   Next MD Visit around 01/21/16   Balance Screen   Has the patient fallen in the past 6 months No   Has the patient had a decrease in activity level because of a fear of falling?  No   Is the patient reluctant to leave their home because of a fear of falling?  No   Prior Function   Vocation Full time employment   Vocation Requirements desk work   Leisure has been rather sedintary past several months but states has returned to some water aerobics 2x/wk recently   Observation/Other Assessments   Focus on Therapeutic Outcomes (FOTO)  72% limitation   Functional Tests   Functional tests Squat   Squat   Comments limited to 25% parallel then onset of B anterior knee pain.   Significant FW wt shift noted.   ROM / Strength   AROM / PROM / Strength Strength   AROM   Overall AROM Comments Lumbar AROM Flexion hands to mid shins and c/o L Posterior LE pulling, Extension 75% normal without c/o pain   Strength   Strength Assessment Site Hip;Knee   Right/Left Hip Right;Left   Right Hip Flexion 4/5  lateral knee pain   Right Hip Extension 4/5   Right Hip External Rotation  4/5  no pain   Right Hip Internal Rotation 4/5  lateral knee pain   Right Hip ADduction 4/5  no pain   Left Hip Flexion 4/5  no pain   Left Hip Extension 4/5   Left Hip External Rotation 4+/5  no pain   Left Hip Internal Rotation 3+/5  lateral hip pain   Left Hip ABduction 3+/5  lateral hip pain   Right/Left Knee Right;Left   Right Knee Flexion 4/5  lateral knee pain   Right Knee Extension 4+/5   Left Knee Flexion 3+/5  posterior thigh pain   Left Knee Extension 4+/5   Flexibility   Soft Tissue Assessment /Muscle Length yes   Hamstrings B tightness   Quadriceps B tightness         TODAY'S TREATMENT TherEx - Seated HS stretch Bridge 10x Instructed in Prone Knee flexion stretch for HEP but did not perform today           PT Education - 01/01/16 1124    Education provided Yes   Education Details Initial HEP   Person(s) Educated Patient   Methods Explanation;Demonstration;Handout   Comprehension Verbalized understanding;Returned demonstration          PT Short Term Goals - 01/01/16 1124    PT SHORT TERM GOAL #1   Title pt independent with initial HEP by 01/16/16   Status New   PT SHORT TERM GOAL #2   Title pt reports at least 25% improvement in B LE pain by 01/21/16   Status New           PT Long Term Goals - 01/01/16 1125    PT LONG TERM GOAL #1   Title pt independent with advanced HEP vs regular exercise program by 02/12/16   Status New   PT LONG TERM GOAL #2   Title pt reports L LE pain no greater than 2/10 and infrequent in nature by 02/12/16  Status New   PT LONG TERM GOAL #3   Title pt reports able to drive without limitation by R LE pain by 02/12/16   Status New   PT LONG TERM GOAL #4   Title pt reports able to sleep without limitation by pain by 02/12/16   Status New   PT LONG TERM GOAL #5   Title pt displays B hip and knee MMT 4+/5 or better without pain by 02/12/16   Status New               Plan - 01/01/16 0953    Clinical Impression Statement pt with B LE pain. L LE pain has been present 5 months and is noted posterior thigh to hip/buttock and is worsened with prolonged sitting or with lying in bed (with L side-lying or supine lying).  L LE pain is reproduced with Slump and SLR today and is also reproduced to some extent with R LE Slump so it seems there is a likely neural component to her L LE pain.  However pain also reproduced with Knee Flexion MMT so HS strain may also be involved.  Pain and tenderness also noted to L lateral hip over GT which prevents L side-lying (so L Hip ADD and R Hip ABD MMT not assessed today) and this pain is increased with L Hip ABD and IR MMT so this does seem GT bursitis vs glute tendinitis.  R LE pain has been noted past month and is currently only noted while driving.  R LE pain is noted to lateral knee and extends distally into lateral lower leg.  There is tenderness over R fibular head as well as distal ITB and HS.  Pain is increased with Knee Flexion MMT but not with Ankle DF or EV MMT.  R LE pain seems as though may be "gas pedal knee" type of overuse injury.  She rates B LE pain up to 8/10 at worst.  We will continue to assess L LE for possible neural component.  Pt's gait displays excessive B lateral sway of trunk over stance leg.  Her treatments will focus on B LE soft tissue pliability due to tightness throughout B hips and thighs along with hip stability.  Knee strengthening and ankle stability will be performed as necessary.   Pt will benefit from skilled therapeutic intervention in order  to improve on the following deficits Pain;Decreased strength;Postural dysfunction;Abnormal gait;Impaired flexibility   Rehab Potential Good   Clinical Impairments Affecting Rehab Potential overweight, diabetic   PT Frequency 2x / week   PT Duration 6 weeks   PT Treatment/Interventions Manual techniques;Therapeutic exercise;Therapeutic activities;Moist Heat;Taping;Dry needling;Balance training;Patient/family education;Functional mobility training;Gait training;Ultrasound;Electrical Stimulation   PT Next Visit Plan ITB strumming; Possible R fibular head mobs;  Stretch B HS, RF; B Hip stability to tolerance; modalies PRN for pain   Consulted and Agree with Plan of Care Patient         Problem List Patient Active Problem List   Diagnosis Date Noted  . Chronic cholecystitis 10/25/2013  . DYSPNEA 06/05/2009  . DM 06/04/2009  . OBSTRUCTIVE SLEEP APNEA 06/04/2009  . HYPERTENSION 06/04/2009  . EDEMA LEG 06/04/2009    Colan Laymon PT, OCS 01/01/2016, 11:50 AM  [facility] [address]  [place], [place], [address] Phone: [phone]   Fax:  [phone]  Name: Vaughn Dighton MRN: [id number] Date of Birth: Feb 18, 1951

## 2016-01-06 ENCOUNTER — Ambulatory Visit: Payer: 59 | Admitting: Physical Therapy

## 2016-01-06 DIAGNOSIS — M25552 Pain in left hip: Secondary | ICD-10-CM

## 2016-01-06 DIAGNOSIS — M25561 Pain in right knee: Secondary | ICD-10-CM

## 2016-01-06 DIAGNOSIS — R29898 Other symptoms and signs involving the musculoskeletal system: Secondary | ICD-10-CM

## 2016-01-06 DIAGNOSIS — R269 Unspecified abnormalities of gait and mobility: Secondary | ICD-10-CM

## 2016-01-06 DIAGNOSIS — M25562 Pain in left knee: Principal | ICD-10-CM

## 2016-01-06 NOTE — Therapy (Signed)
[facility] [address]  [place], [place], [address] Phone: [phone]   Fax:  [phone]  Physical Therapy Treatment  Patient Details  Name: Sue Davidson MRN: [id number] Date of Birth: 16-May-1951 Referring Provider: Pete Pelt PA-C  Encounter Date: 01/06/2016      PT End of Session - 01/06/16 0843    Visit Number 2   Number of Visits 12   Date for PT Re-Evaluation 02/12/16   PT Start Time 0804   PT Stop Time 0845   PT Time Calculation (min) 41 min   Activity Tolerance Patient tolerated treatment well   Behavior During Therapy [facility] for tasks assessed/performed      Past Medical History  Diagnosis Date  . Diabetes mellitus without complication ([place])   . Heart murmur   . Hyperlipidemia   . Hypertension   . Anginal pain (HCC)     hx of  . Anxiety   . Depression   . GERD (gastroesophageal reflux disease)   . Headache(784.0)   . Anemia     hx of  . Vitamin D deficiency   . PONV (postoperative nausea and vomiting)     severe  . Sleep apnea   . MRSA (methicillin resistant staph aureus) culture positive     Past Surgical History  Procedure Laterality Date  . Leg surgery Left 1980's    broke leg and ankle  . Appendectomy  1970's  . Tubal ligation  1970's  . Tonsillectomy  1970's  . Abdominal hysterectomy  1970's  . Cardiac catheterization    . Hardware removal Left     ankle  . Elbow surgery Left   . Shoulder arthroscopy Bilateral   . Cyst removed Left     wrist  . Cholecystectomy N/A 10/02/2013    Procedure: LAPAROSCOPIC CHOLECYSTECTOMY;  Surgeon: Earnstine Regal, MD;  Location: [facility];  Service: General;  Laterality: N/A;    There were no vitals filed for this visit.  Visit Diagnosis:  Knee pain, bilateral  Hip pain, left  Weakness of both hips  Abnormality of gait      Subjective Assessment - 01/06/16 0809    Subjective Patient reports that she has increased bilateral LE pain  since the last visit, she reports that she did the HEP until Sunday and stopped due to pain in the legs.  She mentions the swelling in her legs, reports that she is on Lasix and that she is seeing her MD about that today   Currently in Pain? Yes   Pain Score 9    Pain Location Leg   Pain Orientation Right;Left;Posterior   Pain Descriptors / Indicators Aching   Aggravating Factors  sitting, driving   Pain Relieving Factors nothing                         [facility] Adult PT Treatment/Exercise - 01/06/16 0001    Exercises   Exercises Knee/Hip   Knee/Hip Exercises: Stretches   Passive Hamstring Stretch 4 reps;20 seconds   Quad Stretch 3 reps;20 seconds   Hip Flexor Stretch 3 reps;20 seconds   ITB Stretch 4 reps;20 seconds   Piriformis Stretch 4 reps;20 seconds   Gastroc Stretch 3 reps;20 seconds   Knee/Hip Exercises: Aerobic   Nustep Level 5 x 5 minutes   Knee/Hip Exercises: Seated   Long Arc Quad 2 sets;10 reps   Ball Squeeze 2x10   Other Seated Knee/Hip  Exercises heel raises and toe raises while seated 20 reps   Manual Therapy   Manual therapy comments some ITB strumming, then ITB rolling                PT Education - 01/06/16 0842    Education provided Yes   Education Details asked patient to do some gastroc stretches while at work, get up every 1-2 hours and stretch with ball of feet on a book 2x 20 seconds   Person(s) Educated Patient   Methods Explanation;Demonstration   Comprehension Verbalized understanding;Returned demonstration          PT Short Term Goals - 01/01/16 1124    PT SHORT TERM GOAL #1   Title pt independent with initial HEP by 01/16/16   Status New   PT SHORT TERM GOAL #2   Title pt reports at least 25% improvement in B LE pain by 01/21/16   Status New           PT Long Term Goals - 01/01/16 1125    PT LONG TERM GOAL #1   Title pt independent with advanced HEP vs regular exercise program by 02/12/16   Status New   PT LONG  TERM GOAL #2   Title pt reports L LE pain no greater than 2/10 and infrequent in nature by 02/12/16   Status New   PT LONG TERM GOAL #3   Title pt reports able to drive without limitation by R LE pain by 02/12/16   Status New   PT LONG TERM GOAL #4   Title pt reports able to sleep without limitation by pain by 02/12/16   Status New   PT LONG TERM GOAL #5   Title pt displays B hip and knee MMT 4+/5 or better without pain by 02/12/16   Status New               Plan - 01/06/16 0843    Clinical Impression Statement Patient is extremely tight in the bilateral LE's especially calves and ITB's.  She does have swelling with pitting edema in the lower leg that she is going to ask her MD about today.  She tolerated the treatment today without increased pain at the end, but did have pain with the stretching.  She is very tender in the posterior knee, calf and along the ITB   PT Next Visit Plan Continue with the flexibility exercises, may try the fibular head mobilizations   Consulted and Agree with Plan of Care Patient        Problem List Patient Active Problem List   Diagnosis Date Noted  . Chronic cholecystitis 10/25/2013  . DYSPNEA 06/05/2009  . DM 06/04/2009  . OBSTRUCTIVE SLEEP APNEA 06/04/2009  . HYPERTENSION 06/04/2009  . EDEMA LEG 06/04/2009    Sumner Boast., PT 01/06/2016, 8:47 AM  [facility] [address]  [place], [place], [address] Phone: [phone]   Fax:  [phone]  Name: Sue Davidson MRN: [id number] Date of Birth: 07-24-1951

## 2016-01-08 ENCOUNTER — Ambulatory Visit: Payer: 59 | Attending: Physician Assistant

## 2016-01-08 DIAGNOSIS — M25562 Pain in left knee: Secondary | ICD-10-CM | POA: Insufficient documentation

## 2016-01-08 DIAGNOSIS — M25552 Pain in left hip: Secondary | ICD-10-CM | POA: Diagnosis present

## 2016-01-08 DIAGNOSIS — M6289 Other specified disorders of muscle: Secondary | ICD-10-CM | POA: Insufficient documentation

## 2016-01-08 DIAGNOSIS — R29898 Other symptoms and signs involving the musculoskeletal system: Secondary | ICD-10-CM

## 2016-01-08 DIAGNOSIS — R269 Unspecified abnormalities of gait and mobility: Secondary | ICD-10-CM | POA: Diagnosis present

## 2016-01-08 DIAGNOSIS — M25561 Pain in right knee: Secondary | ICD-10-CM

## 2016-01-08 NOTE — Therapy (Addendum)
[facility] [address]  [place], [place], [address] Phone: [phone]   Fax:  [phone]  Physical Therapy Treatment  Patient Details  Name: Sue Davidson MRN: [id number] Date of Birth: 06-Sep-1951 Referring Provider: Pete Pelt PA-C  Encounter Date: 01/08/2016      PT End of Session - 01/08/16 0846    Visit Number 3   Number of Visits 12   Date for PT Re-Evaluation 02/12/16   PT Start Time 0846   PT Stop Time 0938   PT Time Calculation (min) 52 min   Activity Tolerance Patient tolerated treatment well   Behavior During Therapy [facility] for tasks assessed/performed      Past Medical History  Diagnosis Date  . Diabetes mellitus without complication ([place])   . Heart murmur   . Hyperlipidemia   . Hypertension   . Anginal pain (HCC)     hx of  . Anxiety   . Depression   . GERD (gastroesophageal reflux disease)   . Headache(784.0)   . Anemia     hx of  . Vitamin D deficiency   . PONV (postoperative nausea and vomiting)     severe  . Sleep apnea   . MRSA (methicillin resistant staph aureus) culture positive     Past Surgical History  Procedure Laterality Date  . Leg surgery Left 1980's    broke leg and ankle  . Appendectomy  1970's  . Tubal ligation  1970's  . Tonsillectomy  1970's  . Abdominal hysterectomy  1970's  . Cardiac catheterization    . Hardware removal Left     ankle  . Elbow surgery Left   . Shoulder arthroscopy Bilateral   . Cyst removed Left     wrist  . Cholecystectomy N/A 10/02/2013    Procedure: LAPAROSCOPIC CHOLECYSTECTOMY;  Surgeon: Earnstine Regal, MD;  Location: [facility];  Service: General;  Laterality: N/A;    There were no vitals filed for this visit.  Visit Diagnosis:  Knee pain, bilateral  Hip pain, left  Weakness of both hips  Abnormality of gait      Subjective Assessment - 01/08/16 0808    Subjective Pt. states, 8/10 pain R lateral knee at rest.  Pt. reports  icing R knee while driving.  Pt. reports L hip pain worse while driving   Patient Stated Goals decrease pain to allow more exercise and wt loss   Currently in Pain? Yes   Pain Score 8    Pain Location Knee   Pain Orientation Right;Lateral   Pain Descriptors / Indicators Aching   Pain Type Acute pain   Pain Radiating Towards R knee down into R ankle   Pain Onset More than a month ago   Pain Frequency Constant   Aggravating Factors  sitting, driving.   Pain Relieving Factors rest   Multiple Pain Sites Yes   Pain Score 3   Pain Location Buttocks   Pain Orientation Left   Pain Descriptors / Indicators Aching   Pain Type Acute pain   Pain Radiating Towards n/a   Pain Onset More than a month ago   Pain Frequency Intermittent   Aggravating Factors  driving       Today's treatment:  Manual : Manual B HS, RF, x 30 sec each  R sidelying strumming with white foam roller to L ITB strumming x 1 min R sideling L HS, RF stretch x 30 sec each; STM to L  RF with stretch R fibular head mobs grade 3; pt. very sensitive around fibular head L side lying strumming with white foam roller to R ITB x 1 min  L sideling R HS, RF stretch x 30 sec each; STM to R RF with stretch  Therex: Bridging 2 x 10 resp  Hook-lying clam shells 2 x 10 reps with green TB        PT Education - 01/08/16 0902    Education provided Yes   Education Details Review HEP: HS sitting HS stretch, bridging, prone RF stretch    Person(s) Educated Patient   Methods Explanation;Demonstration;Tactile cues   Comprehension Verbalized understanding;Returned demonstration;Need further instruction;Verbal cues required          PT Short Term Goals - 01/08/16 0913    PT SHORT TERM GOAL #1   Title pt independent with initial HEP by 01/16/16  01/08/16: pt. Independent with initial HEP.   Status Achieved   PT SHORT TERM GOAL #2   Title pt reports at least 25% improvement in B LE pain by 01/21/16   Status On-going            PT Long Term Goals - 01/08/16 0944    PT LONG TERM GOAL #1   Title pt independent with advanced HEP vs regular exercise program by 02/12/16   Status On-going   PT LONG TERM GOAL #2   Title pt reports L LE pain no greater than 2/10 and infrequent in nature by 02/12/16   Status On-going   PT LONG TERM GOAL #3   Title pt reports able to drive without limitation by R LE pain by 02/12/16   Status On-going   PT LONG TERM GOAL #4   Title pt reports able to sleep without limitation by pain by 02/12/16   Status On-going   PT LONG TERM GOAL #5   Title pt displays B hip and knee MMT 4+/5 or better without pain by 02/12/16   Status On-going           Plan - 01/08/16 0847    Clinical Impression Statement Pt. continues to be tight in B HS, RF, ITB's with acute ITB sensitivity with side-lying strumming.  Pt. to see MD regarding ongoing edema in LE however reports cannot get in to see cardiologist until End of April for cardiologist regarding fluid in B LE.  Pt. to see orthopedist mid May.      PT Next Visit Plan Flexibility exercises, B ITB strumming with foam roller, possible taping to R lateral knee at fibular head   Consulted and Agree with Plan of Care --       Problem List Patient Active Problem List   Diagnosis Date Noted  . Chronic cholecystitis 10/25/2013  . DYSPNEA 06/05/2009  . DM 06/04/2009  . OBSTRUCTIVE SLEEP APNEA 06/04/2009  . HYPERTENSION 06/04/2009  . EDEMA LEG 06/04/2009    Bess Harvest, PTA 01/08/2016, 9:48 AM  [facility] [address]  [place], [place], [address] Phone: [phone]   Fax:  [phone]  Name: Sue Davidson MRN: [id number] Date of Birth: [birth year]   PHYSICAL THERAPY DISCHARGE SUMMARY  Visits from Start of Care: 3  Current functional level related to goals / functional outcomes: No improvement.  Pt seen for initial eval and 2 treatments. Did not return to PT after that.   Remaining  deficits: unkown    Plan: Patient agrees to discharge.  Patient goals were not  met. Patient is being discharged due to not returning since the last visit.  ?????        Leonette Most PT, OCS 03/03/2016 10:46 AM

## 2016-01-13 ENCOUNTER — Ambulatory Visit: Payer: 59

## 2016-01-15 ENCOUNTER — Ambulatory Visit: Payer: 59

## 2016-01-19 ENCOUNTER — Ambulatory Visit: Payer: 59 | Admitting: Physical Therapy

## 2016-01-19 ENCOUNTER — Emergency Department (HOSPITAL_COMMUNITY): Payer: 59

## 2016-01-19 ENCOUNTER — Encounter (HOSPITAL_COMMUNITY): Payer: Self-pay | Admitting: Emergency Medicine

## 2016-01-19 ENCOUNTER — Emergency Department (HOSPITAL_COMMUNITY)
Admission: EM | Admit: 2016-01-19 | Discharge: 2016-01-19 | Disposition: A | Discharge status: Home / Self Care | Payer: 59 | Attending: Emergency Medicine | Admitting: Emergency Medicine

## 2016-01-19 DIAGNOSIS — M25561 Pain in right knee: Secondary | ICD-10-CM | POA: Diagnosis not present

## 2016-01-19 DIAGNOSIS — Z8669 Personal history of other diseases of the nervous system and sense organs: Secondary | ICD-10-CM | POA: Insufficient documentation

## 2016-01-19 DIAGNOSIS — I209 Angina pectoris, unspecified: Secondary | ICD-10-CM | POA: Insufficient documentation

## 2016-01-19 DIAGNOSIS — R600 Localized edema: Secondary | ICD-10-CM | POA: Diagnosis not present

## 2016-01-19 DIAGNOSIS — Z862 Personal history of diseases of the blood and blood-forming organs and certain disorders involving the immune mechanism: Secondary | ICD-10-CM | POA: Diagnosis not present

## 2016-01-19 DIAGNOSIS — M25562 Pain in left knee: Secondary | ICD-10-CM | POA: Insufficient documentation

## 2016-01-19 DIAGNOSIS — E119 Type 2 diabetes mellitus without complications: Secondary | ICD-10-CM | POA: Diagnosis not present

## 2016-01-19 DIAGNOSIS — M1990 Unspecified osteoarthritis, unspecified site: Secondary | ICD-10-CM | POA: Insufficient documentation

## 2016-01-19 DIAGNOSIS — M79605 Pain in left leg: Secondary | ICD-10-CM | POA: Insufficient documentation

## 2016-01-19 DIAGNOSIS — I10 Essential (primary) hypertension: Secondary | ICD-10-CM | POA: Diagnosis not present

## 2016-01-19 DIAGNOSIS — R011 Cardiac murmur, unspecified: Secondary | ICD-10-CM | POA: Insufficient documentation

## 2016-01-19 DIAGNOSIS — E785 Hyperlipidemia, unspecified: Secondary | ICD-10-CM | POA: Diagnosis not present

## 2016-01-19 DIAGNOSIS — Z8659 Personal history of other mental and behavioral disorders: Secondary | ICD-10-CM | POA: Insufficient documentation

## 2016-01-19 DIAGNOSIS — Z8614 Personal history of Methicillin resistant Staphylococcus aureus infection: Secondary | ICD-10-CM | POA: Diagnosis not present

## 2016-01-19 DIAGNOSIS — K219 Gastro-esophageal reflux disease without esophagitis: Secondary | ICD-10-CM | POA: Insufficient documentation

## 2016-01-19 DIAGNOSIS — Z7982 Long term (current) use of aspirin: Secondary | ICD-10-CM | POA: Diagnosis not present

## 2016-01-19 DIAGNOSIS — M79604 Pain in right leg: Secondary | ICD-10-CM | POA: Insufficient documentation

## 2016-01-19 DIAGNOSIS — Z79899 Other long term (current) drug therapy: Secondary | ICD-10-CM | POA: Insufficient documentation

## 2016-01-19 DIAGNOSIS — Z794 Long term (current) use of insulin: Secondary | ICD-10-CM | POA: Insufficient documentation

## 2016-01-19 DIAGNOSIS — R609 Edema, unspecified: Secondary | ICD-10-CM

## 2016-01-19 MED ORDER — ENOXAPARIN SODIUM 120 MG/0.8ML ~~LOC~~ SOLN
1.0000 mg/kg | Freq: Once | SUBCUTANEOUS | Status: AC
  Administered 2016-01-19: 120 mg via SUBCUTANEOUS
  Filled 2016-01-19: qty 0.8

## 2016-01-19 MED ORDER — OXYCODONE-ACETAMINOPHEN 5-325 MG PO TABS
1.0000 | ORAL_TABLET | ORAL | Status: DC | PRN
Start: 2016-01-19 — End: 2016-04-06

## 2016-01-19 NOTE — ED Notes (Signed)
Pt states 3 days ago she was walking up stairs and felt her right leg "give out", causing increased pain since this incident

## 2016-01-19 NOTE — ED Notes (Signed)
Ortho tech at bedside 

## 2016-01-19 NOTE — ED Provider Notes (Signed)
CSN: [id number]     Arrival date & time 01/19/16  1454 History   First MD Initiated Contact with Patient 01/19/16 1952     Chief Complaint  Patient presents with  . Knee Pain  . Leg Pain     (Consider location/radiation/quality/duration/timing/severity/associated sxs/prior Treatment) HPI   Sue Davidson is a(n) 65 y.o. female who presents  To the ED for cc of BL leg and knee pain. It has been ongoing for several months. She has a hx or OA and BL peripheral edema. SHe has an appointment with cardiology for her EDEMA but no hx of CHF. She states that her Right knee has been especially painful and feels as if itis going to give out. She has been using norco without relief. She denies heat, or redness. She denies a hx of gout.   She denies injury.  Past Medical History  Diagnosis Date  . Diabetes mellitus without complication ([place])   . Heart murmur   . Hyperlipidemia   . Hypertension   . Anginal pain (HCC)     hx of  . Anxiety   . Depression   . GERD (gastroesophageal reflux disease)   . Headache(784.0)   . Anemia     hx of  . Vitamin D deficiency   . PONV (postoperative nausea and vomiting)     severe  . Sleep apnea   . MRSA (methicillin resistant staph aureus) culture positive    Past Surgical History  Procedure Laterality Date  . Leg surgery Left 1980's    broke leg and ankle  . Appendectomy  1970's  . Tubal ligation  1970's  . Tonsillectomy  1970's  . Abdominal hysterectomy  1970's  . Cardiac catheterization    . Hardware removal Left     ankle  . Elbow surgery Left   . Shoulder arthroscopy Bilateral   . Cyst removed Left     wrist  . Cholecystectomy N/A 10/02/2013    Procedure: LAPAROSCOPIC CHOLECYSTECTOMY;  Surgeon: Earnstine Regal, MD;  Location: [facility];  Service: General;  Laterality: N/A;   Family History  Problem Relation Age of Onset  . Diabetes Mother   . Heart disease Mother   . Cancer Father     throat and lung  . Cancer Paternal Aunt     breast  .  Cancer Paternal Aunt     colon   Social History  Substance Use Topics  . Smoking status: Never Smoker   . Smokeless tobacco: Never Used  . Alcohol Use: No   OB History    No data available     Review of Systems  Ten systems reviewed and are negative for acute change, except as noted in the HPI.    Allergies  Review of patient's allergies indicates no known allergies.  Home Medications   Prior to Admission medications   Medication Sig Start Date End Date Taking? Authorizing Provider  aspirin 81 MG tablet Take 81 mg by mouth daily.   Yes Historical Provider, MD  furosemide (LASIX) 40 MG tablet Take 40-80 mg by mouth daily. Take two tablets every other day 08/11/13  Yes Historical Provider, MD  HUMALOG MIX 75/25 (75-25) 100 UNIT/ML SUSP injection Inject 20-90 Units into the skin 3 (three) times daily. 75 units every morning, 20 units at lunch, and 90 units at bedtime. 08/15/13  Yes Historical Provider, MD  levothyroxine (SYNTHROID, LEVOTHROID) 75 MCG tablet Take 75 mcg by mouth daily before breakfast.   Yes Historical Provider,  MD  olmesartan (BENICAR) 40 MG tablet Take 20 mg by mouth daily with breakfast.   Yes Historical Provider, MD  omeprazole (PRILOSEC) 40 MG capsule Take 40 mg by mouth daily. Reported on 01/19/2016 07/18/13  Yes Historical Provider, MD  ONE TOUCH ULTRA TEST test strip  07/29/13  Yes Historical Provider, MD  rosuvastatin (CRESTOR) 20 MG tablet Take 10 mg by mouth daily with breakfast.   Yes Historical Provider, MD  enoxaparin (LOVENOX) 100 MG/ML injection Inject 1.2 mLs (121 mg total) into the skin every 12 (twelve) hours. Patient not taking: Reported on 01/01/2016 10/09/15   Jarrett Soho Muthersbaugh, PA-C  oxyCODONE-acetaminophen (PERCOCET) 5-325 MG tablet Take 1-2 tablets by mouth every 4 (four) hours as needed. 01/19/16   Tabita Corbo, PA-C   BP 138/52 mmHg  Pulse 68  Temp(Src) 98 F (36.7 C) (Oral)  Resp 19  Wt 119.75 kg  SpO2 100% Physical Exam   Constitutional: She is oriented to person, place, and time. She appears well-developed and well-nourished. No distress.  HENT:  Head: Normocephalic and atraumatic.  Eyes: Conjunctivae are normal. No scleral icterus.  Neck: Normal range of motion.  Cardiovascular: Normal rate, regular rhythm and normal heart sounds.  Exam reveals no gallop and no friction rub.   No murmur heard. Pulmonary/Chest: Effort normal and breath sounds normal. No respiratory distress.  Abdominal: Soft. Bowel sounds are normal. She exhibits no distension and no mass. There is no tenderness. There is no guarding.  Musculoskeletal:  BL knee swelling, crepitus and pain. No redness. Pain along the joint line. Bilateral peripheral edema, worse on the left. This is chronic for the patient. She has signs of venous stasis, tattooing   Neurological: She is alert and oriented to person, place, and time.  Skin: Skin is warm and dry. She is not diaphoretic.  Nursing note and vitals reviewed.   ED Course  Procedures (including critical care time) Labs Review Labs Reviewed - No data to display  Imaging Review No results found. I have personally reviewed and evaluated these images and lab results as part of my medical decision-making.   EKG Interpretation None      MDM   Final diagnoses:  Arthralgia of both knees  Peripheral edema   Patient with Hx of OA, BL peripheral edema is equal in size. SIgns of cheromic venous stasis. Negative Xray. I doubt dvt, however we will cover iwht lovenox and have the patient return tomorrowfor a dvt study. The patient appears reasonably screened and/or stabilized for discharge and I doubt any other medical condition or other [facility] requiring further screening, evaluation, or treatment in the ED at this time prior to discharge.    Margarita Mail, PA-C 01/21/16 2216  Nat Christen, MD 01/21/16 [phone]

## 2016-01-19 NOTE — Discharge Instructions (Signed)
Peripheral Edema You have swelling in your legs (peripheral edema). This swelling is due to excess accumulation of salt and water in your body. Edema may be a sign of heart, kidney or liver disease, or a side effect of a medication. It may also be due to problems in the leg veins. Elevating your legs and using special support stockings may be very helpful, if the cause of the swelling is due to poor venous circulation. Avoid long periods of standing, whatever the cause. Treatment of edema depends on identifying the cause. Chips, pretzels, pickles and other salty foods should be avoided. Restricting salt in your diet is almost always needed. Water pills (diuretics) are often used to remove the excess salt and water from your body via urine. These medicines prevent the kidney from reabsorbing sodium. This increases urine flow. Diuretic treatment may also result in lowering of potassium levels in your body. Potassium supplements may be needed if you have to use diuretics daily. Daily weights can help you keep track of your progress in clearing your edema. You should call your caregiver for follow up care as recommended. SEEK IMMEDIATE MEDICAL CARE IF:   You have increased swelling, pain, redness, or heat in your legs.  You develop shortness of breath, especially when lying down.  You develop chest or abdominal pain, weakness, or fainting.  You have a fever.   This information is not intended to replace advice given to you by your health care provider. Make sure you discuss any questions you have with your health care provider.   Document Released: 12/02/2004 Document Revised: 01/17/2012 Document Reviewed: 05/07/2015 Elsevier Interactive Patient Education 2016 [employer]   Knee Pain Knee pain is a very common symptom and can have many causes. Knee pain often goes away when you follow your health care provider's instructions for relieving pain and discomfort at home. However, knee pain can  develop into a condition that needs treatment. Some conditions may include:  Arthritis caused by wear and tear (osteoarthritis).  Arthritis caused by swelling and irritation (rheumatoid arthritis or gout).  A cyst or growth in your knee.  An infection in your knee joint.  An injury that will not heal.  Damage, swelling, or irritation of the tissues that support your knee (torn ligaments or tendinitis). If your knee pain continues, additional tests may be ordered to diagnose your condition. Tests may include X-rays or other imaging studies of your knee. You may also need to have fluid removed from your knee. Treatment for ongoing knee pain depends on the cause, but treatment may include:  Medicines to relieve pain or swelling.  Steroid injections in your knee.  Physical therapy.  Surgery. HOME CARE INSTRUCTIONS  Take medicines only as directed by your health care provider.  Rest your knee and keep it raised (elevated) while you are resting.  Do not do things that cause or worsen pain.  Avoid high-impact activities or exercises, such as running, jumping rope, or doing jumping jacks.  Apply ice to the knee area:  Put ice in a plastic bag.  Place a towel between your skin and the bag.  Leave the ice on for 20 minutes, 2-3 times a day.  Ask your health care provider if you should wear an elastic knee support.  Keep a pillow under your knee when you sleep.  Lose weight if you are overweight. Extra weight can put pressure on your knee.  Do not use any tobacco products, including cigarettes, chewing tobacco, or electronic  cigarettes. If you need help quitting, ask your health care provider. Smoking may slow the healing of any bone and joint problems that you may have. SEEK MEDICAL CARE IF:  Your knee pain continues, changes, or gets worse.  You have a fever along with knee pain.  Your knee buckles or locks up.  Your knee becomes more swollen. SEEK IMMEDIATE MEDICAL  CARE IF:   Your knee joint feels hot to the touch.  You have chest pain or trouble breathing.   This information is not intended to replace advice given to you by your health care provider. Make sure you discuss any questions you have with your health care provider.   Document Released: 08/22/2007 Document Revised: 11/15/2014 Document Reviewed: 06/10/2014 Elsevier Interactive Patient Education [employer].

## 2016-01-19 NOTE — ED Notes (Signed)
PA at bedside.

## 2016-01-19 NOTE — ED Notes (Signed)
Patient presents for bilateral knee and leg pain x1-2 months. Swelling noted to bilateral calves, knees and thighs. Bruising to lateral right knee. Denies numbness/tingling, SOB, legs arm to touch.

## 2016-01-20 ENCOUNTER — Ambulatory Visit (HOSPITAL_COMMUNITY)
Admission: RE | Admit: 2016-01-20 | Discharge: 2016-01-20 | Disposition: A | Discharge status: Home / Self Care | Payer: 59 | Source: Ambulatory Visit | Attending: Emergency Medicine | Admitting: Emergency Medicine

## 2016-01-20 DIAGNOSIS — M7989 Other specified soft tissue disorders: Secondary | ICD-10-CM | POA: Insufficient documentation

## 2016-01-20 DIAGNOSIS — M79609 Pain in unspecified limb: Secondary | ICD-10-CM | POA: Diagnosis not present

## 2016-01-20 DIAGNOSIS — M79669 Pain in unspecified lower leg: Secondary | ICD-10-CM | POA: Insufficient documentation

## 2016-01-20 NOTE — Progress Notes (Signed)
*  Preliminary Results* Bilateral lower extremity venous duplex completed. Visualized veins of bilateral lower extremities are negative for deep vein thrombosis. There is no evidence of Baker's cyst bilaterally.  01/20/2016  Maudry Mayhew, RVT, RDCS, RDMS

## 2016-01-22 ENCOUNTER — Ambulatory Visit: Payer: 59

## 2016-01-26 ENCOUNTER — Encounter: Payer: Self-pay | Admitting: Cardiovascular Disease

## 2016-01-26 ENCOUNTER — Ambulatory Visit: Payer: 59 | Admitting: Physical Therapy

## 2016-01-26 ENCOUNTER — Ambulatory Visit (INDEPENDENT_AMBULATORY_CARE_PROVIDER_SITE_OTHER): Payer: 59 | Admitting: Cardiovascular Disease

## 2016-01-26 VITALS — BP 164/74 | HR 67 | Ht 66.0 in | Wt 268.0 lb

## 2016-01-26 DIAGNOSIS — R600 Localized edema: Secondary | ICD-10-CM

## 2016-01-26 DIAGNOSIS — R0600 Dyspnea, unspecified: Secondary | ICD-10-CM | POA: Insufficient documentation

## 2016-01-26 DIAGNOSIS — R0609 Other forms of dyspnea: Secondary | ICD-10-CM

## 2016-01-26 DIAGNOSIS — E785 Hyperlipidemia, unspecified: Secondary | ICD-10-CM | POA: Insufficient documentation

## 2016-01-26 DIAGNOSIS — I10 Essential (primary) hypertension: Secondary | ICD-10-CM

## 2016-01-26 DIAGNOSIS — G4733 Obstructive sleep apnea (adult) (pediatric): Secondary | ICD-10-CM | POA: Diagnosis not present

## 2016-01-26 DIAGNOSIS — R0602 Shortness of breath: Secondary | ICD-10-CM

## 2016-01-26 NOTE — Patient Instructions (Signed)
Your physician has requested that you have an echocardiogram. Echocardiography is a painless test that uses sound waves to create images of your heart. It provides your doctor with information about the size and shape of your heart and how well your heart's chambers and valves are working. This procedure takes approximately one hour. There are no restrictions for this procedure.  Your physician recommends that you schedule a follow-up appointment first available in the sleep clinic with Dr Claiborne Billings.  Dr Sallyanne Kuster recommends that you schedule a follow-up appointment in 3 months.

## 2016-01-26 NOTE — Progress Notes (Signed)
Patient ID: Sue Davidson, female   DOB: 05-Apr-1951, 65 y.o.   MRN: [id number]    Cardiology Office Note    Date:  01/26/2016   ID:  Sue Davidson, DOB [birth year], MRN [id number]  PCP:  Horatio Pel, MD  Cardiologist:   Sanda Klein, MD   Chief Complaint  Patient presents with  . New Evaluation    Self-Referred for swelling in legs  pt c/o swelling/fluid retention--has been going on for a month but has gotten worse in the last 2 weeks; SOB/cough--more at night than during the day;     History of Present Illness:  Sue Davidson is a 65 y.o. female previously seen by Dr. Aldona Davidson (last visit October 2012) presents with complaints of bilateral lower extremity edema and exertional dyspnea.  She has bilateral leg edema that has improved with the use of compression stockings. The edema does not resolve after overnight supine position. She has noticed a roughly 18 pound weight gain over the last few months. She is sleeping on 2 pillows. She becomes short of breath climbing a single flight of stairs. She has noticed tightness in both cast due to swelling but also complains that her hamstring muscles are very tight. She denies exertional chest pain or pressure or any symptoms at rest. She scores 15 points on the Epworth Sleepiness Scale. She continues to work full time. She denies syncope, falling asleep at the wheel, focal neurological events, bleeding, cough, hemoptysis, fever or chills, skin rashes.  Sue Davidson has type 2 diabetes mellitus that requires treatment with insulin (Dr. Chalmers Cater), treated hyperlipidemia, treated hypertension, treated hypothyroidism. She was diagnosed many years ago with obstructive sleep apnea (sleep study performed in 2007), but has never been able to tolerate treatment with CPAP. She underwent cardiac catheterization most recently in October 2012 which showed no evidence of coronary obstructive disease. Left ventricular end-diastolic pressure was 10 mmHg. 2 years earlier  in 2010 she had right and left heart catheterization that showed a pulmonary artery wedge pressure of 6 and a PA pressure of 22/9. Prior to that she had normal coronary angiograms performed in 1997 and in 2003.  Echocardiography performed in 2010 showed a normal left ventricle with borderline LVH and EF greater than 55%, normal wall motion, impaired LV relaxation, estimated right ventricular systolic pressure 32 mmHg. Nuclear stress testing showed a false positive anterior defect in 2008.  Past Medical History  Diagnosis Date  . Diabetes mellitus without complication ([place])   . Heart murmur   . Hyperlipidemia   . Hypertension   . Anginal pain (HCC)     hx of  . Anxiety   . Depression   . GERD (gastroesophageal reflux disease)   . Headache(784.0)   . Anemia     hx of  . Vitamin D deficiency   . PONV (postoperative nausea and vomiting)     severe  . Sleep apnea   . MRSA (methicillin resistant staph aureus) culture positive     Past Surgical History  Procedure Laterality Date  . Leg surgery Left 1980's    broke leg and ankle  . Appendectomy  1970's  . Tubal ligation  1970's  . Tonsillectomy  1970's  . Abdominal hysterectomy  1970's  . Cardiac catheterization    . Hardware removal Left     ankle  . Elbow surgery Left   . Shoulder arthroscopy Bilateral   . Cyst removed Left     wrist  . Cholecystectomy N/A 10/02/2013    Procedure:  LAPAROSCOPIC CHOLECYSTECTOMY;  Surgeon: Earnstine Regal, MD;  Location: [facility];  Service: General;  Laterality: N/A;    Outpatient Prescriptions Prior to Visit  Medication Sig Dispense Refill  . aspirin 81 MG tablet Take 81 mg by mouth daily.    Marland Kitchen enoxaparin (LOVENOX) 100 MG/ML injection Inject 1.2 mLs (121 mg total) into the skin every 12 (twelve) hours. 20 mL 0  . furosemide (LASIX) 40 MG tablet Take 40-80 mg by mouth daily. Take two tablets every other day    . HUMALOG MIX 75/25 (75-25) 100 UNIT/ML SUSP injection Inject 20-90 Units into the skin  3 (three) times daily. 75 units every morning, 20 units at lunch, and 90 units at bedtime.    Marland Kitchen levothyroxine (SYNTHROID, LEVOTHROID) 75 MCG tablet Take 75 mcg by mouth daily before breakfast.    . olmesartan (BENICAR) 40 MG tablet Take 20 mg by mouth daily with breakfast.    . omeprazole (PRILOSEC) 40 MG capsule Take 20 mg by mouth daily. Reported on 01/19/2016    . ONE TOUCH ULTRA TEST test strip     . oxyCODONE-acetaminophen (PERCOCET) 5-325 MG tablet Take 1-2 tablets by mouth every 4 (four) hours as needed. 20 tablet 0  . rosuvastatin (CRESTOR) 20 MG tablet Take 10 mg by mouth daily with breakfast.     No facility-administered medications prior to visit.     Allergies:   Review of patient's allergies indicates no known allergies.   Social History   Social History  . Marital Status: Married    Spouse Name: N/A  . Number of Children: N/A  . Years of Education: N/A   Social History Main Topics  . Smoking status: Never Smoker   . Smokeless tobacco: Never Used  . Alcohol Use: No  . Drug Use: No  . Sexual Activity: Yes    Birth Control/ Protection: Spermicide   Other Topics Concern  . None   Social History Narrative   Epworth Sleepiness Scale Score:  15      --I have HTN   --I have had Insomnia   --I feel stressed and lack motivation   --I have Diabetes   --I am overweight or am gaining weight   --I awake feeling not rested        Family History:  The patient's family history includes Cancer in her father, paternal aunt, and paternal aunt; Diabetes in her mother; Heart disease in her mother.   ROS:   Please see the history of present illness.    ROS All other systems reviewed and are negative.   PHYSICAL EXAM:   VS:  BP 164/74 mmHg  Pulse 67  Ht 5\' 6"  (1.676 m)  Wt 121.564 kg (268 lb)  BMI 43.28 kg/m2   GEN: Well nourished, well developed, in no acute distress HEENT: normal Neck: no JVD, carotid bruits, or masses Cardiac: RRR, normal S1 and S2 and a distinct  S4; no murmurs, rubs, or gallops,no edema  Respiratory:  clear to auscultation bilaterally, normal work of breathing GI: soft, nontender, nondistended, + BS MS: no deformity or atrophy Skin: warm and dry, no rash Neuro:  Alert and Oriented x 3, Strength and sensation are intact Psych: euthymic mood, full affect  Wt Readings from Last 3 Encounters:  01/26/16 121.564 kg (268 lb)  01/19/16 119.75 kg (264 lb)  10/09/15 117.17 kg (258 lb 5 oz)      Studies/Labs Reviewed:   EKG:  EKG is ordered today.  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  Recent Labs: No results found for requested labs within last 365 days.   Lipid Panel    Component Value Date/Time   CHOL 225* 07/15/2011 0653   TRIG 186* 07/15/2011 0653   HDL 30* 07/15/2011 0653   CHOLHDL 7.5 07/15/2011 0653   VLDL 37 07/15/2011 0653   LDLCALC 158* 07/15/2011 0653     ASSESSMENT:    1. Shortness of breath   2. Bilateral leg edema   3. OSA (obstructive sleep apnea)   4. Essential hypertension   5. Morbid obesity due to excess calories ([place])   6. Hyperlipidemia      PLAN:  In order of problems listed above:  1. Dyspnea on exertion:  likely to be multifactorial with an important contribution of morbid obesity, possibly diastolic left heart failure, pulmonary hypertension due to untreated obstructive sleep apnea. I think we should update her echocardiogram with particular attention to left ventricular diastolic function and filling pressures and pulmonary artery pressure evaluation. 2. Edema: She has had bilateral lower extremity venous duplex ultrasound without evidence of DVT. She may have venous insufficiency or the swelling could be an expression of right heart failure. Echo will be helpful. Continue to wear compression stockings and keep the legs elevated as much as possible. For the time being no changes made to her diuretic  prescription 3. OSA: Reevaluation of her sleep disorder is likely recommended, more than 10 years since her initial sleep study and without any formal assessment. She has not been compliant with CPAP therapy but owns her own CPAP equipment and is therefore still in his possession. I recommend a sleep clinic evaluation with Dr. Ellouise Newer. 4. HTN: Her blood pressure was elevated today but this is atypical. Just recently her blood pressure was normal and other physician's office. 5. Obesity:  I'm sure this is contributing significantly to her edema and her dyspnea 6. HLP: Need to get updated lab results from primary care provider/endocrinologist    Medication Adjustments/Labs and Tests Ordered: Current medicines are reviewed at length with the patient today.  Concerns regarding medicines are outlined above.  Medication changes, Labs and Tests ordered today are listed in the Patient Instructions below. Patient Instructions  Your physician has requested that you have an echocardiogram. Echocardiography is a painless test that uses sound waves to create images of your heart. It provides your doctor with information about the size and shape of your heart and how well your heart's chambers and valves are working. This procedure takes approximately one hour. There are no restrictions for this procedure.  Your physician recommends that you schedule a follow-up appointment first available in the sleep clinic with Dr Claiborne Billings.  Dr Sallyanne Kuster recommends that you schedule a follow-up appointment in 3 months.      Mikael Spray, MD  01/26/2016 5:30 PM    [address], [place], [place]  [address] Phone: [phone]; Fax: [phone]

## 2016-01-27 ENCOUNTER — Ambulatory Visit: Payer: 59

## 2016-01-29 ENCOUNTER — Ambulatory Visit: Payer: 59

## 2016-02-02 ENCOUNTER — Ambulatory Visit: Payer: 59 | Admitting: Physical Therapy

## 2016-02-05 ENCOUNTER — Ambulatory Visit: Payer: 59

## 2016-02-09 ENCOUNTER — Ambulatory Visit: Payer: 59

## 2016-02-12 ENCOUNTER — Ambulatory Visit (HOSPITAL_COMMUNITY): Payer: 59 | Attending: Cardiology

## 2016-02-12 ENCOUNTER — Other Ambulatory Visit: Payer: Self-pay

## 2016-02-12 ENCOUNTER — Ambulatory Visit: Payer: 59 | Admitting: Physical Therapy

## 2016-02-12 DIAGNOSIS — E119 Type 2 diabetes mellitus without complications: Secondary | ICD-10-CM | POA: Diagnosis not present

## 2016-02-12 DIAGNOSIS — G4733 Obstructive sleep apnea (adult) (pediatric): Secondary | ICD-10-CM | POA: Insufficient documentation

## 2016-02-12 DIAGNOSIS — E785 Hyperlipidemia, unspecified: Secondary | ICD-10-CM | POA: Diagnosis not present

## 2016-02-12 DIAGNOSIS — Z6841 Body Mass Index (BMI) 40.0 and over, adult: Secondary | ICD-10-CM | POA: Insufficient documentation

## 2016-02-12 DIAGNOSIS — I071 Rheumatic tricuspid insufficiency: Secondary | ICD-10-CM | POA: Diagnosis not present

## 2016-02-12 DIAGNOSIS — I272 Other secondary pulmonary hypertension: Secondary | ICD-10-CM | POA: Diagnosis not present

## 2016-02-12 DIAGNOSIS — R0602 Shortness of breath: Secondary | ICD-10-CM | POA: Diagnosis not present

## 2016-02-12 DIAGNOSIS — I358 Other nonrheumatic aortic valve disorders: Secondary | ICD-10-CM | POA: Diagnosis not present

## 2016-02-12 DIAGNOSIS — I371 Nonrheumatic pulmonary valve insufficiency: Secondary | ICD-10-CM | POA: Diagnosis not present

## 2016-02-12 DIAGNOSIS — R0600 Dyspnea, unspecified: Secondary | ICD-10-CM | POA: Diagnosis present

## 2016-02-12 DIAGNOSIS — I10 Essential (primary) hypertension: Secondary | ICD-10-CM | POA: Insufficient documentation

## 2016-02-12 MED ORDER — PERFLUTREN LIPID MICROSPHERE
1.0000 mL | INTRAVENOUS | Status: AC | PRN
  Administered 2016-02-12: 1 mL via INTRAVENOUS

## 2016-03-03 ENCOUNTER — Ambulatory Visit: Payer: 59 | Admitting: Cardiovascular Disease

## 2016-03-09 ENCOUNTER — Other Ambulatory Visit: Payer: Self-pay

## 2016-03-09 DIAGNOSIS — Z1231 Encounter for screening mammogram for malignant neoplasm of breast: Secondary | ICD-10-CM

## 2016-03-24 ENCOUNTER — Ambulatory Visit
Admission: RE | Admit: 2016-03-24 | Discharge: 2016-03-24 | Disposition: A | Discharge status: Home / Self Care | Payer: 59 | Source: Ambulatory Visit

## 2016-03-24 DIAGNOSIS — Z1231 Encounter for screening mammogram for malignant neoplasm of breast: Secondary | ICD-10-CM

## 2016-04-06 ENCOUNTER — Encounter: Payer: Self-pay | Admitting: Cardiovascular Disease

## 2016-04-06 ENCOUNTER — Ambulatory Visit (INDEPENDENT_AMBULATORY_CARE_PROVIDER_SITE_OTHER): Payer: 59 | Admitting: Cardiovascular Disease

## 2016-04-06 VITALS — BP 145/73 | HR 70 | Ht 66.0 in | Wt 267.4 lb

## 2016-04-06 DIAGNOSIS — I10 Essential (primary) hypertension: Secondary | ICD-10-CM | POA: Diagnosis not present

## 2016-04-06 DIAGNOSIS — E785 Hyperlipidemia, unspecified: Secondary | ICD-10-CM | POA: Diagnosis not present

## 2016-04-06 DIAGNOSIS — R600 Localized edema: Secondary | ICD-10-CM

## 2016-04-06 DIAGNOSIS — G4733 Obstructive sleep apnea (adult) (pediatric): Secondary | ICD-10-CM

## 2016-04-06 DIAGNOSIS — R1011 Right upper quadrant pain: Secondary | ICD-10-CM

## 2016-04-06 NOTE — Patient Instructions (Signed)
Your physician has recommended that you have a sleep study. This test records several body functions during sleep, including: brain activity, eye movement, oxygen and carbon dioxide blood levels, heart rate and rhythm, breathing rate and rhythm, the flow of air through your mouth and nose, snoring, body muscle movements, and chest and belly movement.  Your physician recommends that you schedule a follow-up appointment in: October sleep clinic.  Your physician has recommended you make the following change in your medication:    1.) increase the furosemide to 80 mg x 3 days , then return to your current dose.

## 2016-04-06 NOTE — Progress Notes (Signed)
Patient ID: Sue Davidson, female   DOB: 05-17-1951, 65 y.o.   MRN: [id number]    Primary M.D.: Dr. Deland Pretty Primary cardiologist: Dr. Sallyanne Kuster  HPI: Sue Davidson is a 65 y.o. female who presents for sleep clinic evaluation through the referral of Dr. Sallyanne Kuster.  Ms. Grondahl is a former patient of Dr. Chase Picket.  She had undergone remote cardiac catheterization in 2012 and was not found to have coronary obstructive disease.  She has a history of type 2 diabetes mellitus on insulin therapy, hyperlipidemia, hypertension, as well as hypothyroidism for which she has been on medical therapy.  In 2007.  She states that she underwent a sleep study.  She was started on CPAP therapy at that time but never really use this consistently.  In 2016.  She perhaps use it for several days in January.  She has used it a total of 4 days.  In 2017.  She recently seen by Dr. Sallyanne Kuster and prevent presents now for a sleep evaluation.  She states she typically goes to bed at 10 PM and wakes up at 5 AM.  She snores loudly.  She admits to daytime sleepiness.  Her sleep is poor with frequent awakenings.  She has non-restorative sleep.  She believes that she only is able to sleep about 2-3 hours per night despite being in bed for 7 hours.  She is bothered by knee discomfort.  Remotely, she had difficulty with mask leak.  She denies any episodes of chest pain or palpitations.  She presents for evaluation.   Epworth Sleepiness Scale: Situation   Chance of Dozing/Sleeping (0 = never , 1 = slight chance , 2 = moderate chance , 3 = high chance )   sitting and reading 1   watching TV 2   sitting inactive in a public place 1   being a passenger in a motor vehicle for an hour or more 1   lying down in the afternoon 3   sitting and talking to someone 1   sitting quietly after lunch (no alcohol) 1   while stopped for a few minutes in traffic as the driver 1   Total Score  11    Past Medical History  Diagnosis Date  .  Diabetes mellitus without complication ([place])   . Heart murmur   . Hyperlipidemia   . Hypertension   . Anginal pain (HCC)     hx of  . Anxiety   . Depression   . GERD (gastroesophageal reflux disease)   . Headache(784.0)   . Anemia     hx of  . Vitamin D deficiency   . PONV (postoperative nausea and vomiting)     severe  . Sleep apnea   . MRSA (methicillin resistant staph aureus) culture positive     Past Surgical History  Procedure Laterality Date  . Leg surgery Left 1980's    broke leg and ankle  . Appendectomy  1970's  . Tubal ligation  1970's  . Tonsillectomy  1970's  . Abdominal hysterectomy  1970's  . Cardiac catheterization    . Hardware removal Left     ankle  . Elbow surgery Left   . Shoulder arthroscopy Bilateral   . Cyst removed Left     wrist  . Cholecystectomy N/A 10/02/2013    Procedure: LAPAROSCOPIC CHOLECYSTECTOMY;  Surgeon: Earnstine Regal, MD;  Location: [facility];  Service: General;  Laterality: N/A;    No Known Allergies  Current Outpatient Prescriptions  Medication  Sig Dispense Refill  . aspirin 81 MG tablet Take 81 mg by mouth daily.    . furosemide (LASIX) 40 MG tablet Take 40-80 mg by mouth daily. Take two tablets every other day    . HUMALOG MIX 75/25 (75-25) 100 UNIT/ML SUSP injection Inject 20-90 Units into the skin 3 (three) times daily. 75 units every morning, 20 units at lunch, and 90 units at bedtime.    Marland Kitchen HYDROcodone-acetaminophen (NORCO/VICODIN) 5-325 MG tablet Take 1 tablet by mouth every 6 (six) hours as needed for moderate pain.    Marland Kitchen levothyroxine (SYNTHROID, LEVOTHROID) 75 MCG tablet Take 75 mcg by mouth daily before breakfast.    . olmesartan (BENICAR) 40 MG tablet Take 20 mg by mouth daily with breakfast.    . omeprazole (PRILOSEC) 40 MG capsule Take 20 mg by mouth daily. Reported on 01/19/2016    . ondansetron (ZOFRAN) 8 MG tablet Take 8 mg by mouth 2 (two) times daily as needed for nausea or vomiting.    . ONE TOUCH ULTRA TEST test  strip     . rosuvastatin (CRESTOR) 20 MG tablet Take 10 mg by mouth daily with breakfast.    . Turmeric 500 MG CAPS Take 1 capsule by mouth daily.     No current facility-administered medications for this visit.    Social History   Social History  . Marital Status: Married    Spouse Name: N/A  . Number of Children: N/A  . Years of Education: N/A   Occupational History  . Not on file.   Social History Main Topics  . Smoking status: Never Smoker   . Smokeless tobacco: Never Used  . Alcohol Use: No  . Drug Use: No  . Sexual Activity: Yes    Birth Control/ Protection: Spermicide   Other Topics Concern  . Not on file   Social History Narrative   Epworth Sleepiness Scale Score:  15      --I have HTN   --I have had Insomnia   --I feel stressed and lack motivation   --I have Diabetes   --I am overweight or am gaining weight   --I awake feeling not rested      Additional social history is notable that her husband died in 09/19/15.  She has a daughter who has CAD and was morbidly obese.  Recently loss 130 pounds.  She has a son with diabetes mellitus.  Family History  Problem Relation Age of Onset  . Diabetes Mother   . Heart disease Mother   . Cancer Father     throat and lung  . Cancer Paternal Aunt     breast  . Cancer Paternal Aunt     colon   Aditional family history is notable that it is her father died at age 40 and had previously undergone CABG surgery and had a stroke.  Mother died at age 8 from sepsis initiating as cellulitis.  One brother has undergone CABG surgery.  She has a total of 3 living brothers and 1 sister.  ROS General: Negative; No fevers, chills, or night sweats HEENT: Negative; No changes in vision or hearing, sinus congestion, difficulty swallowing Pulmonary: Negative; No cough, wheezing, shortness of breath, hemoptysis Cardiovascular: Negative; No chest pain, presyncope, syncope, palpatations GI: Negative; No nausea, vomiting,  diarrhea, or abdominal pain GU: Negative; No dysuria, hematuria, or difficulty voiding Musculoskeletal: Negative; no myalgias, joint pain, or weakness Hematologic: Negative; no easy bruising, bleeding Endocrine: Positive for hypothyroidism and diabetes mellitus.  Neuro: Negative; no changes in balance, headaches Skin: Negative; No rashes or skin lesions Psychiatric: Negative; No behavioral problems, depression Sleep: Positive for poor sleep, loud snoring, frequent awakenings daytime sleepiness, hypersomnolence,  nobruxism, restless legs, hypnogognic hallucinations, no cataplexy   Physical Exam BP 145/73 mmHg  Pulse 70  Ht 5' 6"  (1.676 m)  Wt 267 lb 6.4 oz (121.292 kg)  BMI 43.18 kg/m2  Body mass index compatible with morbid obesity  Wt Readings from Last 3 Encounters:  04/06/16 267 lb 6.4 oz (121.292 kg)  01/26/16 268 lb (121.564 kg)  01/19/16 264 lb (119.75 kg)   General: Alert, oriented, no distress.  Skin: normal turgor, no rashes HEENT: Normocephalic, atraumatic. Pupils round and reactive; sclera anicteric; extraocular muscles intact; Fundi Without hemorrhages or exudates Nose without nasal septal hypertrophy Mouth/Parynx benign; Mallinpatti scale 3 Neck: No JVD, no carotid briuts Lungs: clear to ausculatation and percussion; no wheezing or rales  Chest wall: No tenderness to palpation Heart: RRR, s1 s2 normal; 1/6 systolic murmur.  No S3 gallop.  No diastolic murmur.  No rubs thrills or heaves. Abdomen: Moderate central adiposity ;soft, nontender; no hepatosplenomehaly, BS+; abdominal aorta nontender and not dilated by palpation. Back: No CVA tenderness Pulses 2+ Extremities: 1+ edema, left greater than right ankle.  no clubbing cyanosis, Homan's sign negative  Neurologic: grossly nonfocal; cranial nerves intact. Psychological: Normal affect and mood.  Not done today, but the 01/29/2016 ECG was reviewed (independently read by me): Sinus rhythm at 67, left anterior  hemiblock.  LABS:  BMP Latest Ref Rng 09/24/2013 06/18/2013 07/14/2011  Glucose 70 - 99 mg/dL 77 159(H) 149(H)  BUN 6 - 23 mg/dL 19 16 24(H)  Creatinine 0.50 - 1.10 mg/dL 0.95 0.94 1.11(H)  Sodium 135 - 145 mEq/L 143 137 139  Potassium 3.5 - 5.1 mEq/L 4.6 4.1 4.1  Chloride 96 - 112 mEq/L 104 104 97  CO2 19 - 32 mEq/L 30 30 32  Calcium 8.4 - 10.5 mg/dL 10.2 9.0 9.9     Hepatic Function Latest Ref Rng 06/18/2013 07/14/2011  Total Protein 6.4-8.2 g/dL 7.7 8.0  Albumin 3.4-5.0 g/dL 3.2(L) 3.7  AST 15-37 Unit/L 25 22  ALT 12-78 U/L 20 19  Alk Phosphatase 50-136 Unit/L 92 93  Total Bilirubin 0.2-1.0 mg/dL 0.3 0.3     CBC Latest Ref Rng 09/24/2013 06/18/2013 07/14/2011  WBC 4.0 - 10.5 K/uL 8.6 7.7 9.8  Hemoglobin 12.0 - 15.0 g/dL 11.9(L) 12.5 13.7  Hematocrit 36.0 - 46.0 % 37.8 36.7 40.7  Platelets 150 - 400 K/uL 236 230 265     Lipid Panel     Component Value Date/Time   CHOL 225* 07/15/2011 0653   TRIG 186* 07/15/2011 0653   HDL 30* 07/15/2011 0653   CHOLHDL 7.5 07/15/2011 0653   VLDL 37 07/15/2011 0653   LDLCALC 158* 07/15/2011 0653     RADIOLOGY: Mm Screening Breast Tomo Bilateral  03/24/2016  CLINICAL DATA:  Screening. EXAM: 2D DIGITAL SCREENING BILATERAL MAMMOGRAM WITH CAD AND ADJUNCT TOMO COMPARISON:  Previous exam(s). ACR Breast Density Category b: There are scattered areas of fibroglandular density. FINDINGS: There are no findings suspicious for malignancy. Images were processed with CAD. IMPRESSION: No mammographic evidence of malignancy. A result letter of this screening mammogram will be mailed directly to the patient. RECOMMENDATION: Screening mammogram in one year. (Code:SM-B-01Y) BI-RADS CATEGORY  1: Negative. Electronically Signed   By: Lajean Manes M.D.   On: 03/24/2016 16:40      ASSESSMENT AND PLAN: Ms.  Madgie Dhaliwal is a 65 year old female who remotely seen Dr. Chase Picket, and most recently Dr. Sallyanne Kuster for cardiology care.  She was diagnosed as having  obstructive sleep apnea 10 years ago but only used CPAP for minimal time.  She  has not used this more than 4 days in 2017.  We were able to look at her old CPAP unit and apparently this was set at a 12 cm water pressure.  Her AHI was elevated at 12.  Her blood pressure today is upper normall onn her medical regimen consisting of Benicar 40 mg.  She is on Crestor 20, hyperlipidemia, levothyroxine 75 g for hypothyroidism and she continues to experience leg swelling.  She has been taking Lasix 80 mg alternating with 40 mg every other day.  With her leg swelling I have suggested for the next 3 days that she takes 80 mg and then she will revert back to her previous dosing.  With reference to her sleep apnea, I feel it is prudent to reinitiate the process and obtain a new sleep study.  I will schedule her for split-night protocol, to be done at the [facility] long sleep lab.  I discussed with her the significant improvement in CPAP machine technology as well as masks that are available.  She may very well be a candidate for some type of nasal mask rather than a full face mask which she had significant leak abnormality.  I will see her back in a sleep clinic following initiation of CPAP therapy within the 90 day window for Medicare compliance and further evaluation.  Troy Sine, MD, [facility]  04/06/2016 5:59 PM

## 2016-04-23 ENCOUNTER — Ambulatory Visit (INDEPENDENT_AMBULATORY_CARE_PROVIDER_SITE_OTHER): Payer: 59 | Admitting: Cardiovascular Disease

## 2016-04-23 ENCOUNTER — Encounter: Payer: Self-pay | Admitting: Cardiovascular Disease

## 2016-04-23 VITALS — BP 138/62 | HR 89 | Ht 66.0 in | Wt 271.4 lb

## 2016-04-23 DIAGNOSIS — R600 Localized edema: Secondary | ICD-10-CM

## 2016-04-23 DIAGNOSIS — G4733 Obstructive sleep apnea (adult) (pediatric): Secondary | ICD-10-CM

## 2016-04-23 DIAGNOSIS — I2721 Secondary pulmonary arterial hypertension: Secondary | ICD-10-CM | POA: Insufficient documentation

## 2016-04-23 DIAGNOSIS — I272 Other secondary pulmonary hypertension: Secondary | ICD-10-CM

## 2016-04-23 DIAGNOSIS — E785 Hyperlipidemia, unspecified: Secondary | ICD-10-CM

## 2016-04-23 DIAGNOSIS — I10 Essential (primary) hypertension: Secondary | ICD-10-CM | POA: Diagnosis not present

## 2016-04-23 NOTE — Patient Instructions (Signed)
Your physician recommends that you continue on your current medications as directed. Please refer to the Current Medication list given to you today.  Dr Sallyanne Kuster recommends that you schedule a follow-up appointment in JANUARY 2018. You will receive a reminder letter in the mail two months in advance. If you don't receive a letter, please call our office to schedule the follow-up appointment.  If you need a refill on your cardiac medications before your next appointment, please call your pharmacy.

## 2016-04-23 NOTE — Progress Notes (Signed)
Patient ID: Sue Davidson, female   DOB: 03/09/1951, 65 y.o.   MRN: [id number]    Cardiology Office Note    Date:  04/23/2016   ID:  Sue Davidson, DOB 1951/10/02, MRN [id number]  PCP:  Horatio Pel, MD  Cardiologist:   Sanda Klein, MD   No chief complaint on file.   History of Present Illness:  Sue Davidson is a 65 y.o. female with complaints of bilateral lower extremity edema and class 2 exertional dyspnea, returns for follow-up after undergoing echo and having a sleep clinic evaluation with Dr. Claiborne Billings. Echocardiography showed normal left ventricular systolic function and minimum signs of diastolic dysfunction with normal filling pressures, but did show mild pulmonary hypertension.  She is having problems with her right knee and may have a meniscus tear. She is seeing Dr. Wynelle Link and is waiting to hear about the results of her MRI.  Sue Davidson has type 2 diabetes mellitus that requires treatment with insulin (Dr. Chalmers Cater), treated hyperlipidemia, treated hypertension, treated hypothyroidism. She was diagnosed many years ago with obstructive sleep apnea (sleep study performed in 2007), but has never been able to tolerate treatment with CPAP. She underwent cardiac catheterization most recently in October 2012 which showed no evidence of coronary obstructive disease. Left ventricular end-diastolic pressure was 10 mmHg. 2 years earlier in 2010 she had right and left heart catheterization that showed a pulmonary artery wedge pressure of 6 and a PA pressure of 22/9. Prior to that she had normal coronary angiograms performed in 1997 and in 2003.  Echocardiography performed in April 2017 shows: - Left ventricle: The cavity size was normal. Wall thickness was  normal. Systolic function was normal. The estimated ejection  fraction was in the range of 60% to 65%. There was an increased  relative contribution of atrial contraction to ventricular  filling. Doppler parameters are consistent with  abnormal left  ventricular relaxation (grade 1 diastolic dysfunction). - Aortic valve: Mildly thickened, mildly calcified leaflets. - Atrial septum: No defect or patent foramen ovale was identified. - Tricuspid valve: There was trivial regurgitation. - Pulmonic valve: There was trivial regurgitation. - Pulmonary arteries: PA peak pressure: 38 mm Hg (S). - Impressions: Normal LVF with grade 1 diastolic dysfunction. Mild  pulmonary HTN. Mild AV sclerosis  Past Medical History  Diagnosis Date  . Diabetes mellitus without complication ([name])   . Heart murmur   . Hyperlipidemia   . Hypertension   . Anginal pain (HCC)     hx of  . Anxiety   . Depression   . GERD (gastroesophageal reflux disease)   . Headache(784.0)   . Anemia     hx of  . Vitamin D deficiency   . PONV (postoperative nausea and vomiting)     severe  . Sleep apnea   . MRSA (methicillin resistant staph aureus) culture positive     Past Surgical History  Procedure Laterality Date  . Leg surgery Left 1980's    broke leg and ankle  . Appendectomy  1970's  . Tubal ligation  1970's  . Tonsillectomy  1970's  . Abdominal hysterectomy  1970's  . Cardiac catheterization    . Hardware removal Left     ankle  . Elbow surgery Left   . Shoulder arthroscopy Bilateral   . Cyst removed Left     wrist  . Cholecystectomy N/A 10/02/2013    Procedure: LAPAROSCOPIC CHOLECYSTECTOMY;  Surgeon: Earnstine Regal, MD;  Location: [facility];  Service: General;  Laterality: N/A;  Outpatient Prescriptions Prior to Visit  Medication Sig Dispense Refill  . aspirin 81 MG tablet Take 81 mg by mouth daily.    . furosemide (LASIX) 40 MG tablet Take 40-80 mg by mouth daily. Take two tablets every other day    . HUMALOG MIX 75/25 (75-25) 100 UNIT/ML SUSP injection Inject 20-90 Units into the skin 3 (three) times daily. 75 units every morning, 20 units at lunch, and 90 units at bedtime.    Marland Kitchen HYDROcodone-acetaminophen (NORCO/VICODIN) 5-325 MG  tablet Take 1 tablet by mouth every 6 (six) hours as needed for moderate pain.    Marland Kitchen levothyroxine (SYNTHROID, LEVOTHROID) 75 MCG tablet Take 75 mcg by mouth daily before breakfast.    . olmesartan (BENICAR) 40 MG tablet Take 20 mg by mouth daily with breakfast.    . omeprazole (PRILOSEC) 40 MG capsule Take 20 mg by mouth daily. Reported on 01/19/2016    . ondansetron (ZOFRAN) 8 MG tablet Take 8 mg by mouth 2 (two) times daily as needed for nausea or vomiting.    . ONE TOUCH ULTRA TEST test strip     . rosuvastatin (CRESTOR) 20 MG tablet Take 10 mg by mouth daily with breakfast.    . Turmeric 500 MG CAPS Take 1 capsule by mouth daily.     No facility-administered medications prior to visit.     Allergies:   Review of patient's allergies indicates no known allergies.   Social History   Social History  . Marital Status: Married    Spouse Name: N/A  . Number of Children: N/A  . Years of Education: N/A   Social History Main Topics  . Smoking status: Never Smoker   . Smokeless tobacco: Never Used  . Alcohol Use: No  . Drug Use: No  . Sexual Activity: Yes    Birth Control/ Protection: Spermicide   Other Topics Concern  . None   Social History Narrative   Epworth Sleepiness Scale Score:  15      --I have HTN   --I have had Insomnia   --I feel stressed and lack motivation   --I have Diabetes   --I am overweight or am gaining weight   --I awake feeling not rested        Family History:  The patient's family history includes Cancer in her father, paternal aunt, and paternal aunt; Diabetes in her mother; Heart disease in her mother.   ROS:   Please see the history of present illness.    ROS All other systems reviewed and are negative.   PHYSICAL EXAM:   VS:  BP 138/62 mmHg  Pulse 89  Ht 5\' 6"  (1.676 m)  Wt 123.106 kg (271 lb 6.4 oz)  BMI 43.83 kg/m2  SpO2 98%   GEN: Well nourished, well developed, in no acute distress HEENT: normal Neck: no JVD, carotid bruits, or  masses Cardiac: RRR, normal S1 and S2 and a distinct S4; grade 1/6 ejection murmur heard best at the left upper sternal border, no diastolic murmurs, rubs, or gallops,no edema  Respiratory:  clear to auscultation bilaterally, normal work of breathing GI: soft, nontender, nondistended, + BS MS: no deformity or atrophy Skin: warm and dry, no rash Neuro:  Alert and Oriented x 3, Strength and sensation are intact Psych: euthymic mood, full affect  Wt Readings from Last 3 Encounters:  04/23/16 123.106 kg (271 lb 6.4 oz)  04/06/16 121.292 kg (267 lb 6.4 oz)  01/26/16 121.564 kg (268 lb)  Studies/Labs Reviewed:   EKG:  EKG is ordered today.  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  Recent Labs: No results found for requested labs within last 365 days.   Lipid Panel    Component Value Date/Time   CHOL 225* 07/15/2011 0653   TRIG 186* 07/15/2011 0653   HDL 30* 07/15/2011 0653   CHOLHDL 7.5 07/15/2011 0653   VLDL 37 07/15/2011 0653   LDLCALC 158* 07/15/2011 0653   May 2017 Total cholesterol 131, HDL 34, LDL 77, triglycerides 100  ASSESSMENT:    1. Pulmonary artery hypertension (HCC)   2. Bilateral leg edema   3. OSA (obstructive sleep apnea)   4. Essential hypertension   5. Morbid obesity due to excess calories ([place])   6. Hyperlipidemia      PLAN:  In order of problems listed above:  1. Pulmonary artery hypertension:  likely to be multifactorial with an important contribution of morbid obesity, possibly diastolic left heart failure, probably primarily due to untreated obstructive sleep apnea. Although the echo did show some diastolic dysfunction, there was no evidence of elevated left atrial filling pressure. The focus should be on weight loss and treatment of sleep apnea.  2. Edema: Continue to wear compression stockings and keep the legs elevated as much as possible. For the  time being no changes made to her diuretic prescription. The current dose of diuretic seems to be satisfactory. 3. OSA: She is scheduled for a repeat sleep study and then a follow-up with Dr. Claiborne Billings in October  4. HTN: Her blood pressure control is fair, always a little higher in the cardiology office than at home. 5. Obesity:  I'm sure this is contributing significantly to her edema and her dyspnea. She has joined [employer], program she had good success with in the past 6. HLP: All her lipid parameters are within the desirable range based on labs performed on 03/15/2016    Medication Adjustments/Labs and Tests Ordered: Current medicines are reviewed at length with the patient today.  Concerns regarding medicines are outlined above.  Medication changes, Labs and Tests ordered today are listed in the Patient Instructions below. Patient Instructions  Your physician recommends that you continue on your current medications as directed. Please refer to the Current Medication list given to you today.  Dr Sallyanne Kuster recommends that you schedule a follow-up appointment in JANUARY 2018. You will receive a reminder letter in the mail two months in advance. If you don't receive a letter, please call our office to schedule the follow-up appointment.  If you need a refill on your cardiac medications before your next appointment, please call your pharmacy.       Signed, Sanda Klein, MD  04/23/2016 8:05 AM    [facility] [address], [place], [place]  [address] Phone: [phone]; Fax: [phone]

## 2016-05-25 ENCOUNTER — Encounter (HOSPITAL_BASED_OUTPATIENT_CLINIC_OR_DEPARTMENT_OTHER): Payer: 59

## 2016-05-26 ENCOUNTER — Telehealth: Payer: Self-pay | Admitting: *Deleted

## 2016-05-26 NOTE — Telephone Encounter (Signed)
Patient notified [employer] denied in lab sleep study. Home study will be ordered through [facility]. Order hand given to rep-James.

## 2016-05-26 NOTE — Telephone Encounter (Signed)
-----   Message from Benancio Deeds sent at 05/25/2016  1:48 PM EDT ----- Regarding: denied sleep study for [employer] is denying this patient's sleep study for tonight.  They are suggesting an at home titration. This is crazy I know, I did not think with this patient's history that we would have a problem with this one.  I sent all clinicals and included the prior sleep test from 2007.  Please let me know if I can do anything.  Thank you!  Caryl Pina

## 2016-06-07 ENCOUNTER — Telehealth: Payer: Self-pay | Admitting: Cardiovascular Disease

## 2016-06-07 NOTE — Telephone Encounter (Signed)
New Message  Pt call requesting to speak with RN about getting a sleep study complete at home. Pt states she previously spoke with RN about the sleep study. Pt is calling to f/u. Please call back to discuss

## 2016-06-09 NOTE — Telephone Encounter (Signed)
Follow Up:     Pt says she is still waiting to hear from you please.

## 2016-06-09 NOTE — Telephone Encounter (Signed)
Called and left message that Nira Conn has been trying to contact her to set up home sleep study. Left name and number for patient to contact Heather @ [facility].

## 2016-06-09 NOTE — Telephone Encounter (Signed)
Spoke with Heather @ [facility] to check the status on her home sleep study. She informed me they have left messages for her on July 19th and 31st and never heard from her. Heather asked me for the number the patient called me from. She is going to attempt to call the patient again.

## 2016-06-16 ENCOUNTER — Encounter (HOSPITAL_COMMUNITY): Payer: Self-pay

## 2016-06-17 ENCOUNTER — Other Ambulatory Visit: Payer: Self-pay | Admitting: Surgical

## 2016-06-21 ENCOUNTER — Telehealth: Payer: Self-pay | Admitting: Cardiovascular Disease

## 2016-06-21 NOTE — Telephone Encounter (Signed)
New message ° ° ° ° ° ° °Calling to get sleep study results °

## 2016-06-21 NOTE — Telephone Encounter (Signed)
Returned call to patient. States she had a home sleep study done 1 week ago, and turned in machine. Wanted to know if report was read/reviewed yet and I informed her nothing on file. I have checked Wanda's desk for anything received via fax. Pt aware we will call once results reviewed. She wanted to make sure to find out if Dr. Claiborne Billings had any advisements that the sleep study results would affect her undergoing a scheduled knee surgery next Wednesday. Aware I will route to physician for advice.  Aware Mariann Laster is out of office this week.

## 2016-06-22 ENCOUNTER — Encounter (HOSPITAL_COMMUNITY)
Admission: RE | Admit: 2016-06-22 | Discharge: 2016-06-22 | Disposition: A | Discharge status: Home / Self Care | Payer: 59 | Source: Ambulatory Visit | Attending: Orthopedic Surgery | Admitting: Orthopedic Surgery

## 2016-06-22 ENCOUNTER — Encounter (HOSPITAL_COMMUNITY): Payer: Self-pay

## 2016-06-22 DIAGNOSIS — Z01812 Encounter for preprocedural laboratory examination: Secondary | ICD-10-CM | POA: Insufficient documentation

## 2016-06-22 LAB — BASIC METABOLIC PANEL
ANION GAP: 9 (ref 5–15)
BUN: 16 mg/dL (ref 6–20)
CO2: 32 mmol/L (ref 22–32)
Calcium: 9.5 mg/dL (ref 8.9–10.3)
Chloride: 98 mmol/L — ABNORMAL LOW (ref 101–111)
Creatinine, Ser: 0.76 mg/dL (ref 0.44–1.00)
GFR calc Af Amer: >60 mL/min (ref >60)
Glucose, Bld: 129 mg/dL — ABNORMAL HIGH (ref 65–99)
POTASSIUM: 4.1 mmol/L (ref 3.5–5.1)
SODIUM: 139 mmol/L (ref 135–145)

## 2016-06-22 LAB — CBC
HEMATOCRIT: 40.2 % (ref 36.0–46.0)
HEMOGLOBIN: 13.1 g/dL (ref 12.0–15.0)
MCH: 28.4 pg (ref 26.0–34.0)
MCHC: 32.6 g/dL (ref 30.0–36.0)
MCV: 87.2 fL (ref 78.0–100.0)
Platelets: 251 10*3/uL (ref 150–400)
RBC: 4.61 MIL/uL (ref 3.87–5.11)
RDW: 15.4 % (ref 11.5–15.5)
WBC: 10.3 10*3/uL (ref 4.0–10.5)

## 2016-06-22 LAB — SURGICAL PCR SCREEN
MRSA, PCR: NEGATIVE
STAPHYLOCOCCUS AUREUS: NEGATIVE

## 2016-06-22 NOTE — Patient Instructions (Addendum)
Sue Davidson  06/22/2016   Your procedure is scheduled on: 06-30-16   Report to [facility] Main  Entrance take [facility]  elevators to 3rd floor to  [place] at   0700 AM.  Call this number if you have problems the morning of surgery [phone]   Remember: ONLY 1 PERSON MAY GO WITH YOU TO SHORT STAY TO GET  READY MORNING OF [place].  Do not eat food or drink liquids :After Midnight.     Take these medicines the morning of surgery with A SIP OF WATER: Levothyroxine. Omeprazole. Crestor. Insulin(1/2 usual PM dose night before). DO NOT TAKE ANY DIABETIC MEDICATIONS DAY OF YOUR SURGERY                               You may not have any metal on your body including hair pins and              piercings  Do not wear jewelry, make-up, lotions, powders or perfumes, deodorant             Do not wear nail polish.  Do not shave  48 hours prior to surgery.              Men may shave face and neck.   Do not bring valuables to the hospital. [place].  Contacts, dentures or bridgework may not be worn into surgery.  Leave suitcase in the car. After surgery it may be brought to your room.     Patients discharged the day of surgery will not be allowed to drive home.  Name and phone number of your driver:Cathy Koch-daughter [phone]  Special Instructions: N/A              Please read over the following fact sheets you were given: _____________________________________________________________________             [facility] - Preparing for Surgery Before surgery, you can play an important role.  Because skin is not sterile, your skin needs to be as free of germs as possible.  You can reduce the number of germs on your skin by washing with CHG (chlorahexidine gluconate) soap before surgery.  CHG is an antiseptic cleaner which kills germs and bonds with the skin to continue killing germs even after  washing. Please DO NOT use if you have an allergy to CHG or antibacterial soaps.  If your skin becomes reddened/irritated stop using the CHG and inform your nurse when you arrive at Short Stay. Do not shave (including legs and underarms) for at least 48 hours prior to the first CHG shower.  You may shave your face/neck. Please follow these instructions carefully:  1.  Shower with CHG Soap the night before surgery and the  morning of Surgery.  2.  If you choose to wash your hair, wash your hair first as usual with your  normal  shampoo.  3.  After you shampoo, rinse your hair and body thoroughly to remove the  shampoo.                           4.  Use CHG as you would any other liquid soap.  You  can apply chg directly  to the skin and wash                       Gently with a scrungie or clean washcloth.  5.  Apply the CHG Soap to your body ONLY FROM THE NECK DOWN.   Do not use on face/ open                           Wound or open sores. Avoid contact with eyes, ears mouth and genitals (private parts).                       Wash face,  Genitals (private parts) with your normal soap.             6.  Wash thoroughly, paying special attention to the area where your surgery  will be performed.  7.  Thoroughly rinse your body with warm water from the neck down.  8.  DO NOT shower/wash with your normal soap after using and rinsing off  the CHG Soap.                9.  Pat yourself dry with a clean towel.            10.  Wear clean pajamas.            11.  Place clean sheets on your bed the night of your first shower and do not  sleep with pets. Day of Surgery : Do not apply any lotions/deodorants the morning of surgery.  Please wear clean clothes to the hospital/surgery center.  FAILURE TO FOLLOW THESE INSTRUCTIONS MAY RESULT IN THE CANCELLATION OF YOUR SURGERY PATIENT SIGNATURE_________________________________  NURSE  SIGNATURE__________________________________  ________________________________________________________________________  Adam Phenix  An incentive spirometer is a tool that can help keep your lungs clear and active. This tool measures how well you are filling your lungs with each breath. Taking long deep breaths may help reverse or decrease the chance of developing breathing (pulmonary) problems (especially infection) following:  A long period of time when you are unable to move or be active. BEFORE THE PROCEDURE   If the spirometer includes an indicator to show your best effort, your nurse or respiratory therapist will set it to a desired goal.  If possible, sit up straight or lean slightly forward. Try not to slouch.  Hold the incentive spirometer in an upright position. INSTRUCTIONS FOR USE  1. Sit on the edge of your bed if possible, or sit up as far as you can in bed or on a chair. 2. Hold the incentive spirometer in an upright position. 3. Breathe out normally. 4. Place the mouthpiece in your mouth and seal your lips tightly around it. 5. Breathe in slowly and as deeply as possible, raising the piston or the ball toward the top of the column. 6. Hold your breath for 3-5 seconds or for as long as possible. Allow the piston or ball to fall to the bottom of the column. 7. Remove the mouthpiece from your mouth and breathe out normally. 8. Rest for a few seconds and repeat Steps 1 through 7 at least 10 times every 1-2 hours when you are awake. Take your time and take a few normal breaths between deep breaths. 9. The spirometer may include an indicator to show your best effort. Use the indicator as a goal to work toward during each  repetition. 10. After each set of 10 deep breaths, practice coughing to be sure your lungs are clear. If you have an incision (the cut made at the time of surgery), support your incision when coughing by placing a pillow or rolled up towels firmly against  it. Once you are able to get out of bed, walk around indoors and cough well. You may stop using the incentive spirometer when instructed by your caregiver.  RISKS AND COMPLICATIONS  Take your time so you do not get dizzy or light-headed.  If you are in pain, you may need to take or ask for pain medication before doing incentive spirometry. It is harder to take a deep breath if you are having pain. AFTER USE  Rest and breathe slowly and easily.  It can be helpful to keep track of a log of your progress. Your caregiver can provide you with a simple table to help with this. If you are using the spirometer at home, follow these instructions: [place] IF:   You are having difficultly using the spirometer.  You have trouble using the spirometer as often as instructed.  Your pain medication is not giving enough relief while using the spirometer.  You develop fever of 100.5 F (38.1 C) or higher. SEEK IMMEDIATE MEDICAL CARE IF:   You cough up bloody sputum that had not been present before.  You develop fever of 102 F (38.9 C) or greater.  You develop worsening pain at or near the incision site. MAKE SURE YOU:   Understand these instructions.  Will watch your condition.  Will get help right away if you are not doing well or get worse. Document Released: 03/07/2007 Document Revised: 01/17/2012 Document Reviewed: 05/08/2007 [facility] Patient Information 2014 [place], [place].   ________________________________________________________________________

## 2016-06-23 LAB — HEMOGLOBIN A1C
HEMOGLOBIN A1C: 9.6 % — AB (ref 4.8–5.6)
MEAN PLASMA GLUCOSE: 229 mg/dL

## 2016-06-23 NOTE — Progress Notes (Signed)
06-23-16 A1C level = 9.6 with labs done 06-22-16, noted in Epic.Pt. Is known Diabetic on Insulin.

## 2016-06-29 ENCOUNTER — Telehealth: Payer: Self-pay | Admitting: Cardiovascular Disease

## 2016-06-29 DIAGNOSIS — S83289A Other tear of lateral meniscus, current injury, unspecified knee, initial encounter: Secondary | ICD-10-CM | POA: Diagnosis present

## 2016-06-29 MED ORDER — DEXTROSE 5 % IV SOLN
3.0000 g | INTRAVENOUS | Status: AC
  Administered 2016-06-30: 3 g via INTRAVENOUS
  Filled 2016-06-29: qty 3

## 2016-06-29 NOTE — H&P (Signed)
CC- Sue Davidson is a 65 y.o. female who presents with right knee pain.  HPI- . Knee Pain: Patient presents with knee pain involving the  right knee. Onset of the symptoms was several months ago. Inciting event: none known. Current symptoms include giving out, pain located laterally and swelling. Pain is aggravated by lateral movements, pivoting, rising after sitting, squatting and walking.  Patient has had no prior knee problems. Evaluation to date: MRI: abnormal lateral meniscal tear. Treatment to date: corticosteroid injection which was ineffective.  Past Medical History:  Diagnosis Date  . Anemia    hx of  . Anginal pain (HCC)    hx of  . Anxiety   . Depression   . Diabetes mellitus without complication ([place])   . GERD (gastroesophageal reflux disease)   . Headache(784.0)   . Heart murmur   . Hyperlipidemia   . Hypertension   . MRSA (methicillin resistant staph aureus) culture positive    many years ago-abdominal wound- no issuses since.  Marland Kitchen PONV (postoperative nausea and vomiting)    severe  . Sleep apnea    Study done -remains under evaluation- no cpap yet.  . Vitamin D deficiency     Past Surgical History:  Procedure Laterality Date  . ABDOMINAL HYSTERECTOMY  1970's  . APPENDECTOMY  1970's  . CARDIAC CATHETERIZATION    . CHOLECYSTECTOMY N/A 10/02/2013   Procedure: LAPAROSCOPIC CHOLECYSTECTOMY;  Surgeon: Earnstine Regal, MD;  Location: [facility];  Service: General;  Laterality: N/A;  . cyst removed Left    wrist"ganglion"  . ELBOW SURGERY Left   . HARDWARE REMOVAL Left    ankle  . LEG SURGERY Left 1980's   broke leg and ankle  . SHOULDER ARTHROSCOPY Bilateral   . TONSILLECTOMY  1970's  . TUBAL LIGATION  1970's    Prior to Admission medications   Medication Sig Start Date End Date Taking? Authorizing Provider  aspirin 81 MG tablet Take 81 mg by mouth daily.   Yes Historical Provider, MD  furosemide (LASIX) 40 MG tablet Take 40-80 mg by mouth daily. Alternates taking  40mg  then 80mg . 08/11/13  Yes Historical Provider, MD  HUMALOG MIX 75/25 (75-25) 100 UNIT/ML SUSP injection Inject 15-85 Units into the skin 3 (three) times daily with meals. 65 units every morning. 15-25 units at lunch on a sliding scale depending on blood sugar. 85 units with supper. 08/15/13  Yes Historical Provider, MD  ibuprofen (ADVIL,MOTRIN) 200 MG tablet Take 400 mg by mouth at bedtime.   Yes Historical Provider, MD  levothyroxine (SYNTHROID, LEVOTHROID) 75 MCG tablet Take 75 mcg by mouth daily before breakfast.   Yes Historical Provider, MD  olmesartan (BENICAR) 40 MG tablet Take 20 mg by mouth daily with breakfast.   Yes Historical Provider, MD  omeprazole (PRILOSEC) 40 MG capsule Take 40 mg by mouth daily. Reported on 01/19/2016 07/18/13  Yes Historical Provider, MD  ondansetron (ZOFRAN) 8 MG tablet Take 8 mg by mouth 2 (two) times daily as needed for nausea or vomiting.   Yes Historical Provider, MD  rosuvastatin (CRESTOR) 20 MG tablet Take 10 mg by mouth daily with breakfast.   Yes Historical Provider, MD  ONE TOUCH ULTRA TEST test strip  07/29/13   Historical Provider, MD   KNEE EXAM antalgic gait, soft tissue tenderness over lateral joint line, effusion, negative drawer sign, collateral ligaments intact  Physical Examination: General appearance - alert, well appearing, and in no distress Mental status - alert, oriented to person, place, and  time Chest - clear to auscultation, no wheezes, rales or rhonchi, symmetric air entry Heart - normal rate, regular rhythm, normal S1, S2, no murmurs, rubs, clicks or gallops Abdomen - soft, nontender, nondistended, no masses or organomegaly Neurological - alert, oriented, normal speech, no focal findings or movement disorder noted   Asessment/Plan--- Right knee lateral meniscal tear- - Plan right knee arthroscopy with meniscal debridement. Procedure risks and potential comps discussed with patient who elects to proceed. Goals are decreased pain and  increased function with a high likelihood of achieving both

## 2016-06-29 NOTE — Telephone Encounter (Signed)
New Message  Pt call requesting to speak with RN about her Sleep Study results. Pt states she is having surgery in the morning and would like a confirmation of everything being okay before surgery.  Please call back to discuss.

## 2016-06-29 NOTE — Telephone Encounter (Signed)
Called and notified patient of sleep study results. Informed her that Dr Claiborne Billings  Has not seen this report, but it does show OSA.this should not prevent her from having the surgery, but she is to notify the anesthesiologist that she has OSA. They may handle her sedation treatment differently. Patient voiced understanding and thanked me for returning her call.

## 2016-06-29 NOTE — Telephone Encounter (Signed)
Spoke with patient on 06/29/16. See note.

## 2016-06-30 ENCOUNTER — Ambulatory Visit (HOSPITAL_COMMUNITY): Payer: 59 | Admitting: Registered Nurse

## 2016-06-30 ENCOUNTER — Encounter (HOSPITAL_COMMUNITY): Payer: Self-pay | Admitting: *Deleted

## 2016-06-30 ENCOUNTER — Encounter (HOSPITAL_COMMUNITY)
Admission: RE | Disposition: A | Discharge status: Home / Self Care | Payer: Self-pay | Source: Ambulatory Visit | Attending: Orthopedic Surgery

## 2016-06-30 ENCOUNTER — Ambulatory Visit (HOSPITAL_COMMUNITY)
Admission: RE | Admit: 2016-06-30 | Discharge: 2016-06-30 | Disposition: A | Discharge status: Home / Self Care | Payer: 59 | Source: Ambulatory Visit | Attending: Orthopedic Surgery | Admitting: Orthopedic Surgery

## 2016-06-30 DIAGNOSIS — E785 Hyperlipidemia, unspecified: Secondary | ICD-10-CM | POA: Diagnosis not present

## 2016-06-30 DIAGNOSIS — Z7982 Long term (current) use of aspirin: Secondary | ICD-10-CM | POA: Diagnosis not present

## 2016-06-30 DIAGNOSIS — S83281A Other tear of lateral meniscus, current injury, right knee, initial encounter: Secondary | ICD-10-CM | POA: Insufficient documentation

## 2016-06-30 DIAGNOSIS — K219 Gastro-esophageal reflux disease without esophagitis: Secondary | ICD-10-CM | POA: Insufficient documentation

## 2016-06-30 DIAGNOSIS — G4730 Sleep apnea, unspecified: Secondary | ICD-10-CM | POA: Insufficient documentation

## 2016-06-30 DIAGNOSIS — S83241A Other tear of medial meniscus, current injury, right knee, initial encounter: Secondary | ICD-10-CM | POA: Diagnosis not present

## 2016-06-30 DIAGNOSIS — Z6841 Body Mass Index (BMI) 40.0 and over, adult: Secondary | ICD-10-CM | POA: Insufficient documentation

## 2016-06-30 DIAGNOSIS — Z79899 Other long term (current) drug therapy: Secondary | ICD-10-CM | POA: Diagnosis not present

## 2016-06-30 DIAGNOSIS — I10 Essential (primary) hypertension: Secondary | ICD-10-CM | POA: Insufficient documentation

## 2016-06-30 DIAGNOSIS — X58XXXA Exposure to other specified factors, initial encounter: Secondary | ICD-10-CM | POA: Insufficient documentation

## 2016-06-30 DIAGNOSIS — S83289A Other tear of lateral meniscus, current injury, unspecified knee, initial encounter: Secondary | ICD-10-CM | POA: Diagnosis present

## 2016-06-30 DIAGNOSIS — E119 Type 2 diabetes mellitus without complications: Secondary | ICD-10-CM | POA: Diagnosis not present

## 2016-06-30 HISTORY — PX: KNEE ARTHROSCOPY: SHX127

## 2016-06-30 LAB — GLUCOSE, CAPILLARY
GLUCOSE-CAPILLARY: 302 mg/dL — AB (ref 65–99)
Glucose-Capillary: 219 mg/dL — ABNORMAL HIGH (ref 65–99)

## 2016-06-30 SURGERY — ARTHROSCOPY, KNEE
Anesthesia: General | Site: Knee | Laterality: Right

## 2016-06-30 MED ORDER — EPHEDRINE SULFATE 50 MG/ML IJ SOLN
INTRAMUSCULAR | Status: AC
  Filled 2016-06-30: qty 1

## 2016-06-30 MED ORDER — MIDAZOLAM HCL 2 MG/2ML IJ SOLN
INTRAMUSCULAR | Status: AC
  Filled 2016-06-30: qty 2

## 2016-06-30 MED ORDER — BUPIVACAINE-EPINEPHRINE 0.25% -1:200000 IJ SOLN
INTRAMUSCULAR | Status: DC | PRN
  Administered 2016-06-30: 20 mL

## 2016-06-30 MED ORDER — ONDANSETRON HCL 4 MG/2ML IJ SOLN: 4.0000 mg | Freq: Once | INTRAMUSCULAR | Status: DC | PRN

## 2016-06-30 MED ORDER — HYDROCODONE-ACETAMINOPHEN 5-325 MG PO TABS: 1.0000 | ORAL_TABLET | ORAL | 0 refills | Status: DC | PRN

## 2016-06-30 MED ORDER — LIDOCAINE HCL (CARDIAC) 20 MG/ML IV SOLN
INTRAVENOUS | Status: DC | PRN
  Administered 2016-06-30: 100 mg via INTRAVENOUS

## 2016-06-30 MED ORDER — ONDANSETRON HCL 4 MG/2ML IJ SOLN
INTRAMUSCULAR | Status: AC
  Filled 2016-06-30: qty 2

## 2016-06-30 MED ORDER — FENTANYL CITRATE (PF) 100 MCG/2ML IJ SOLN
INTRAMUSCULAR | Status: AC
  Filled 2016-06-30: qty 2

## 2016-06-30 MED ORDER — LACTATED RINGERS IV SOLN
INTRAVENOUS | Status: DC
  Administered 2016-06-30: 1000 mL via INTRAVENOUS

## 2016-06-30 MED ORDER — LIDOCAINE HCL (CARDIAC) 20 MG/ML IV SOLN
INTRAVENOUS | Status: AC
  Filled 2016-06-30: qty 5

## 2016-06-30 MED ORDER — PROPOFOL 10 MG/ML IV BOLUS
INTRAVENOUS | Status: AC
  Filled 2016-06-30: qty 40

## 2016-06-30 MED ORDER — INSULIN ASPART 100 UNIT/ML ~~LOC~~ SOLN
SUBCUTANEOUS | Status: DC | PRN
  Administered 2016-06-30: 10 [IU] via SUBCUTANEOUS

## 2016-06-30 MED ORDER — INSULIN ASPART 100 UNIT/ML ~~LOC~~ SOLN
SUBCUTANEOUS | Status: AC
  Filled 2016-06-30: qty 1

## 2016-06-30 MED ORDER — MIDAZOLAM HCL 5 MG/5ML IJ SOLN
INTRAMUSCULAR | Status: DC | PRN
  Administered 2016-06-30: 2 mg via INTRAVENOUS

## 2016-06-30 MED ORDER — BUPIVACAINE-EPINEPHRINE (PF) 0.25% -1:200000 IJ SOLN
INTRAMUSCULAR | Status: AC
  Filled 2016-06-30: qty 30

## 2016-06-30 MED ORDER — PROPOFOL 10 MG/ML IV BOLUS
INTRAVENOUS | Status: DC | PRN
  Administered 2016-06-30: 230 mg via INTRAVENOUS

## 2016-06-30 MED ORDER — SODIUM CHLORIDE 0.9 % IJ SOLN
INTRAMUSCULAR | Status: AC
  Filled 2016-06-30: qty 10

## 2016-06-30 MED ORDER — LACTATED RINGERS IV SOLN
INTRAVENOUS | Status: DC | PRN
Start: 2016-06-30 — End: 2016-06-30
  Administered 2016-06-30: 08:00:00 via INTRAVENOUS

## 2016-06-30 MED ORDER — FENTANYL CITRATE (PF) 100 MCG/2ML IJ SOLN
INTRAMUSCULAR | Status: DC | PRN
  Administered 2016-06-30: 25 ug via INTRAVENOUS
  Administered 2016-06-30 (×2): 50 ug via INTRAVENOUS

## 2016-06-30 MED ORDER — HYDROCODONE-ACETAMINOPHEN 5-325 MG PO TABS
1.0000 | ORAL_TABLET | ORAL | Status: DC | PRN
  Administered 2016-06-30: 2 via ORAL
  Filled 2016-06-30: qty 2

## 2016-06-30 MED ORDER — STERILE WATER FOR IRRIGATION IR SOLN
Status: DC | PRN
  Administered 2016-06-30: 500 mL

## 2016-06-30 MED ORDER — METHOCARBAMOL 500 MG PO TABS: 500.0000 mg | ORAL_TABLET | Freq: Four times a day (QID) | ORAL | 1 refills | Status: DC

## 2016-06-30 MED ORDER — FENTANYL CITRATE (PF) 100 MCG/2ML IJ SOLN
25.0000 ug | INTRAMUSCULAR | Status: DC | PRN
  Administered 2016-06-30 (×2): 50 ug via INTRAVENOUS

## 2016-06-30 MED ORDER — LACTATED RINGERS IR SOLN
Status: DC | PRN
  Administered 2016-06-30: 9000 mL

## 2016-06-30 SURGICAL SUPPLY — 27 items
BANDAGE ACE 6X5 VEL STRL LF (GAUZE/BANDAGES/DRESSINGS) ×2 IMPLANT
BLADE 4.2CUDA (BLADE) ×2 IMPLANT
COVER SURGICAL LIGHT HANDLE (MISCELLANEOUS) ×2 IMPLANT
DRAPE U-SHAPE 47X51 STRL (DRAPES) ×2 IMPLANT
DRSG EMULSION OIL 3X3 NADH (GAUZE/BANDAGES/DRESSINGS) ×2 IMPLANT
DRSG PAD ABDOMINAL 8X10 ST (GAUZE/BANDAGES/DRESSINGS) ×2 IMPLANT
DURAPREP 26ML APPLICATOR (WOUND CARE) ×2 IMPLANT
GAUZE SPONGE 4X4 12PLY STRL (GAUZE/BANDAGES/DRESSINGS) ×2 IMPLANT
GLOVE BIO SURGEON STRL SZ8 (GLOVE) ×2 IMPLANT
GLOVE BIOGEL PI IND STRL 8 (GLOVE) ×2 IMPLANT
GLOVE BIOGEL PI INDICATOR 8 (GLOVE) ×2
GLOVE SURG SS PI 8.0 STRL IVOR (GLOVE) ×2 IMPLANT
GOWN STRL REUS W/TWL 2XL LVL3 (GOWN DISPOSABLE) ×2 IMPLANT
GOWN STRL REUS W/TWL LRG LVL3 (GOWN DISPOSABLE) ×2 IMPLANT
KIT BASIN OR (CUSTOM PROCEDURE TRAY) ×2 IMPLANT
MANIFOLD NEPTUNE II (INSTRUMENTS) ×2 IMPLANT
MARKER SKIN DUAL TIP RULER LAB (MISCELLANEOUS) ×2 IMPLANT
PACK ARTHROSCOPY WL (CUSTOM PROCEDURE TRAY) ×2 IMPLANT
PACK ICE MAXI GEL EZY WRAP (MISCELLANEOUS) ×6 IMPLANT
PAD ABD 8X10 STRL (GAUZE/BANDAGES/DRESSINGS) ×2 IMPLANT
PADDING CAST COTTON 6X4 STRL (CAST SUPPLIES) ×2 IMPLANT
POSITIONER SURGICAL ARM (MISCELLANEOUS) ×2 IMPLANT
SUT ETHILON 4 0 PS 2 18 (SUTURE) ×2 IMPLANT
TOWEL OR 17X26 10 PK STRL BLUE (TOWEL DISPOSABLE) ×2 IMPLANT
TUBING ARTHRO INFLOW-ONLY STRL (TUBING) ×2 IMPLANT
WAND HAND CNTRL MULTIVAC 90 (MISCELLANEOUS) ×2 IMPLANT
WRAP KNEE MAXI GEL POST OP (GAUZE/BANDAGES/DRESSINGS) ×2 IMPLANT

## 2016-06-30 NOTE — Interval H&P Note (Signed)
History and Physical Interval Note:  06/30/2016 8:35 AM  Sue Davidson  has presented today for surgery, with the diagnosis of right knee lateral mensical tear  The various methods of treatment have been discussed with the patient and family. After consideration of risks, benefits and other options for treatment, the patient has consented to  Procedure(s): ARTHROSCOPY RIGHT KNEE WITH MENSICAL DEBRIDEMENT (Right) as a surgical intervention .  The patient's history has been reviewed, patient examined, no change in status, stable for surgery.  I have reviewed the patient's chart and labs.  Questions were answered to the patient's satisfaction.     Gearlean Alf

## 2016-06-30 NOTE — Op Note (Signed)
Operative Report- KNEE ARTHROSCOPY  Preoperative diagnosis-  Right knee lateral meniscal tear  Postoperative diagnosis Right- knee medial and lateral meniscal tear plus  Chondral defect  Procedure- Right knee arthroscopy with medial and lateral   meniscal debridement and chondroplasty   Surgeon- Dione Plover. Doniesha Landau, MD  Anesthesia-General  EBL-  Minimal  Complications- None  Condition- PACU - hemodynamically stable.  Brief clinical note- -Sue Davidson is a 65 y.o.  female with a several month history of Right pain and mechanical symptoms. Exam and history suggested lateral  meniscal tear confirmed by MRI. The patient presents now for arthroscopy and debridement  Procedure in detail -       After successful administration of General anesthetic, a tourmiquet is placed high on the Right  thigh and the Right lower extremity is prepped and draped in the usual sterile fashion. Time out is performed by the surgical team. Standard superomedial and inferolateral portal sites are marked and incisions made with an 11 blade. The inflow cannula is passed through the superomedial portal and camera through the inferolateral portal and inflow is initiated. Arthroscopic visualization proceeds.      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 The medial and lateral gutters are visualized and there are  no loose bodies. Flexion and valgus force is applied to the knee and the medial compartment is entered. A spinal needle is passed into the joint through the site marked for the inferomedial portal. A small incision is made and the dilator passed into the joint. The findings for the medial compartment are tear of posterior horn of medial meniscus and 1 x 2 cm chondral defect medial femoral condyle . The tear is debrided to a stable base with baskets and a shaver and sealed off with the Arthrocare. The shaver is  used to debride the unstable cartilage to a stable cartilaginous base with stable edges. It is probed and found to be stable.    The intercondylar notch is visualized and the ACL appears normal . The lateral compartment is entered and the findings are unstable tear of anterior horn and body of lateral meniscus . The tear is debrided to a stable base with baskets and a shaver and sealed off with the Arthrocare.  It is probed and found to be stable. 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     The joint is again inspected and there are no other tears, defects or loose bodies identified. The arthroscopic equipment is then removed from the inferior portals which are closed with interrupted 4-0 nylon. 20 ml of .25% Marcaine with epinephrine are injected through the inflow cannula and the cannula is then removed and the portal closed with nylon. The incisions are cleaned and dried and a bulky sterile dressing is applied. The patient is then awakened and transported to recovery in stable condition.   06/30/2016, 9:31 AM

## 2016-06-30 NOTE — Discharge Instructions (Signed)
° °Dr. Frank Aluisio °[facility] Specialist °[facility] °[address] °, [place] [address] °[phone] ° ° °Arthroscopic Procedure, Knee °An arthroscopic procedure can find what is wrong with your knee. °PROCEDURE °Arthroscopy is a surgical technique that allows your orthopedic surgeon to diagnose and treat your knee injury with accuracy. They will look into your knee through a small instrument. This is almost like a small (pencil sized) telescope. Because arthroscopy affects your knee less than open knee surgery, you can anticipate a more rapid recovery. Taking an active role by following your caregiver's instructions will help with rapid and complete recovery. Use crutches, rest, elevation, ice, and knee exercises as instructed. The length of recovery depends on various factors including type of injury, age, physical condition, medical conditions, and your rehabilitation. °Your knee is the joint between the large bones (femur and tibia) in your leg. Cartilage covers these bone ends which are smooth and slippery and allow your knee to bend and move smoothly. Two menisci, thick, semi-lunar shaped pads of cartilage which form a rim inside the joint, help absorb shock and stabilize your knee. Ligaments bind the bones together and support your knee joint. Muscles move the joint, help support your knee, and take stress off the joint itself. Because of this all programs and physical therapy to rehabilitate an injured or repaired knee require rebuilding and strengthening your muscles. °AFTER THE PROCEDURE °· After the procedure, you will be moved to a recovery area until most of the effects of the medication have worn off. Your caregiver will discuss the test results with you.  °· Only take over-the-counter or prescription medicines for pain, discomfort, or fever as directed by your caregiver.  °SEEK MEDICAL CARE IF:  °· You have increased bleeding from your wounds.  °· You see  redness, swelling, or have increasing pain in your wounds.  °· You have pus coming from your wound.  °· You have an oral temperature above 102° F (38.9° C).  °· You notice a bad smell coming from the wound or dressing.  °· You have severe pain with any motion of your knee.  °SEEK IMMEDIATE MEDICAL CARE IF:  °· You develop a rash.  °· You have difficulty breathing.  °· You have any allergic problems.  °FURTHER INSTRUCTIONS:  °· ICE to the affected knee every three hours for 30 minutes at a time and then as needed for pain and swelling.  Continue to use ice on the knee for pain and swelling from surgery. You may notice swelling that will progress down to the foot and ankle.  This is normal after surgery.  Elevate the leg when you are not up walking on it.   ° °DIET °You may resume your previous home diet once your are discharged from the hospital. ° °DRESSING / WOUND CARE / SHOWERING °You may start showering two days after being discharged home but do not submerge the incisions under water.  °Change dressing 48 hours after the procedure and then cover the small incisions with band aids until your follow up visit. °Change the surgical dressings daily and reapply a dry dressing each time.  ° °ACTIVITY °Walk with your walker as instructed. °Use walker as long as suggested by your caregivers. °Avoid periods of inactivity such as sitting longer than an hour when not asleep. This helps prevent blood clots.  °You may resume a sexual relationship in one month or when given the OK by your doctor.  °You may return to   work once you are cleared by your doctor.  °Do not drive a car for 6 weeks or until released by you surgeon.  °Do not drive while taking narcotics. ° °WEIGHT BEARING AS TOLERATED ° °POSTOPERATIVE CONSTIPATION PROTOCOL °Constipation - defined medically as fewer than three stools per week and severe constipation as less than one stool per week. ° °One of the most common issues patients have following surgery is  constipation.  Even if you have a regular bowel pattern at home, your normal regimen is likely to be disrupted due to multiple reasons following surgery.  Combination of anesthesia, postoperative narcotics, change in appetite and fluid intake all can affect your bowels.  In order to avoid complications following surgery, here are some recommendations in order to help you during your recovery period. ° °Colace (docusate) - Pick up an over-the-counter form of Colace or another stool softener and take twice a day as long as you are requiring postoperative pain medications.  Take with a full glass of water daily.  If you experience loose stools or diarrhea, hold the colace until you stool forms back up.  If your symptoms do not get better within 1 week or if they get worse, check with your doctor. ° °Dulcolax (bisacodyl) - Pick up over-the-counter and take as directed by the product packaging as needed to assist with the movement of your bowels.  Take with a full glass of water.  Use this product as needed if not relieved by Colace only.  ° °MiraLax (polyethylene glycol) - Pick up over-the-counter to have on hand.  MiraLax is a solution that will increase the amount of water in your bowels to assist with bowel movements.  Take as directed and can mix with a glass of water, juice, soda, coffee, or tea.  Take if you go more than two days without a movement. °Do not use MiraLax more than once per day. Call your doctor if you are still constipated or irregular after using this medication for 7 days in a row. ° °If you continue to have problems with postoperative constipation, please contact the office for further assistance and recommendations.  If you experience "the worst abdominal pain ever" or develop nausea or vomiting, please contact the office immediatly for further recommendations for treatment. ° °ITCHING ° If you experience itching with your medications, try taking only a single pain pill, or even half a pain pill  at a time.  You can also use Benadryl over the counter for itching or also to help with sleep.  ° °TED HOSE STOCKINGS °Wear the elastic stockings on both legs for three weeks following surgery during the day but you may remove then at night for sleeping. ° °MEDICATIONS °See your medication summary on the “After Visit Summary” that the nursing staff will review with you prior to discharge.  You may have some home medications which will be placed on hold until you complete the course of blood thinner medication.  It is important for you to complete the blood thinner medication as prescribed by your surgeon.  Continue your approved medications as instructed at time of discharge. °Do not drive while taking narcotics.  ° °PRECAUTIONS °If you experience chest pain or shortness of breath - call 911 immediately for transfer to the hospital emergency department.  °If you develop a fever greater that 101 F, purulent drainage from wound, increased redness or drainage from wound, foul odor from the wound/dressing, or calf pain - CONTACT YOUR SURGEON.   °                                                °  FOLLOW-UP APPOINTMENTS Make sure you keep all of your appointments after your operation with your surgeon and caregivers. You should call the office at [phone]  and make an appointment for approximately one week after the date of your surgery or on the date instructed by your surgeon outlined in the "After Visit Summary".  RANGE OF MOTION AND STRENGTHENING EXERCISES  Rehabilitation of the knee is important following a knee injury or an operation. After just a few days of immobilization, the muscles of the thigh which control the knee become weakened and shrink (atrophy). Knee exercises are designed to build up the tone and strength of the thigh muscles and to improve knee motion. Often times heat used for twenty to thirty minutes before working out will loosen up your tissues and help with improving the range of motion  but do not use heat for the first two weeks following surgery. These exercises can be done on a training (exercise) mat, on the floor, on a table or on a bed. Use what ever works the best and is most comfortable for you Knee exercises include:  QUAD STRENGTHENING EXERCISES Strengthening Quadriceps Sets  Tighten muscles on top of thigh by pushing knees down into floor or table. Hold for 20 seconds. Repeat 10 times. Do 2 sessions per day.     Strengthening Terminal Knee Extension  With knee bent over bolster, straighten knee by tightening muscle on top of thigh. Be sure to keep bottom of knee on bolster. Hold for 20 seconds. Repeat 10 times. Do 2 sessions per day.   Straight Leg with Bent Knee  Lie on back with opposite leg bent. Keep involved knee slightly bent at knee and raise leg 4-6". Hold for 10 seconds. Repeat 20 times per set. Do 2 sets per session. Do 2 sessions per day.   General Anesthesia, Adult, Care After Refer to this sheet in the next few weeks. These instructions provide you with information on caring for yourself after your procedure. Your health care provider may also give you more specific instructions. Your treatment has been planned according to current medical practices, but problems sometimes occur. Call your health care provider if you have any problems or questions after your procedure. WHAT TO EXPECT AFTER THE PROCEDURE After the procedure, it is typical to experience:  Sleepiness.  Nausea and vomiting. HOME CARE INSTRUCTIONS  For the first 24 hours after general anesthesia:  Have a responsible person with you.  Do not drive a car. If you are alone, do not take public transportation.  Do not drink alcohol.  Do not take medicine that has not been prescribed by your health care provider.  Do not sign important papers or make important decisions.  You may resume a normal diet and activities as directed by your health care provider.  Change  bandages (dressings) as directed.  If you have questions or problems that seem related to general anesthesia, call the hospital and ask for the anesthetist or anesthesiologist on call. SEEK MEDICAL CARE IF:  You have nausea and vomiting that continue the day after anesthesia.  You develop a rash. SEEK IMMEDIATE MEDICAL CARE IF:   You have difficulty breathing.  You have chest pain.  You have any allergic problems.   This information is not intended to replace advice given to you by your health care provider. Make sure you discuss any questions you have with your health care provider.   Document Released: 01/31/2001 Document Revised: 11/15/2014 Document Reviewed: 02/23/2012 Elsevier  Interactive Patient Education ©2016 [employer]. ° °

## 2016-06-30 NOTE — Anesthesia Preprocedure Evaluation (Addendum)
Anesthesia Evaluation  Patient identified by MRN, date of birth, ID band Patient awake    Reviewed: Allergy & Precautions, NPO status , Patient's Chart, lab work & pertinent test results  History of Anesthesia Complications (+) PONV and history of anesthetic complications  Airway Mallampati: II  TM Distance: >3 FB Neck ROM: Full    Dental no notable dental hx. (+) Dental Advisory Given, Edentulous Upper, Edentulous Lower   Pulmonary sleep apnea ,    Pulmonary exam normal breath sounds clear to auscultation       Cardiovascular hypertension, Pt. on medications Normal cardiovascular exam Rhythm:Regular Rate:Normal     Neuro/Psych  Headaches, negative psych ROS   GI/Hepatic Neg liver ROS, GERD  ,  Endo/Other  diabetesMorbid obesity  Renal/GU negative Renal ROS  negative genitourinary   Musculoskeletal negative musculoskeletal ROS (+)   Abdominal   Peds negative pediatric ROS (+)  Hematology negative hematology ROS (+)   Anesthesia Other Findings   Reproductive/Obstetrics negative OB ROS                             Anesthesia Physical Anesthesia Plan  ASA: III  Anesthesia Plan: General   Post-op Pain Management:    Induction: Intravenous  Airway Management Planned: LMA  Additional Equipment:   Intra-op Plan:   Post-operative Plan: Extubation in OR  Informed Consent: I have reviewed the patients History and Physical, chart, labs and discussed the procedure including the risks, benefits and alternatives for the proposed anesthesia with the patient or authorized representative who has indicated his/her understanding and acceptance.   Dental advisory given  Plan Discussed with: CRNA  Anesthesia Plan Comments:         Anesthesia Quick Evaluation

## 2016-06-30 NOTE — Anesthesia Postprocedure Evaluation (Signed)
Anesthesia Post Note  Patient: Sue Davidson  Procedure(s) Performed: Procedure(s) (LRB): ARTHROSCOPY RIGHT KNEE WITH MEDIAL AND LATERAL MENSICAL DEBRIDEMENT (Right)  Patient location during evaluation: PACU Anesthesia Type: General Level of consciousness: awake and alert Pain management: pain level controlled Vital Signs Assessment: post-procedure vital signs reviewed and stable Respiratory status: spontaneous breathing, nonlabored ventilation, respiratory function stable and patient connected to nasal cannula oxygen Cardiovascular status: blood pressure returned to baseline and stable Postop Assessment: no signs of nausea or vomiting Anesthetic complications: no    Last Vitals:  Vitals:   06/30/16 1045 06/30/16 1118  BP: 131/65 140/62  Pulse: 70 65  Resp: 14 16  Temp: 36.4 C     Last Pain:  Vitals:   06/30/16 1140  TempSrc:   PainSc: 5                  Calyn Sivils JENNETTE

## 2016-06-30 NOTE — Transfer of Care (Signed)
Immediate Anesthesia Transfer of Care Note  Patient: Sue Davidson  Procedure(s) Performed: Procedure(s) with comments: ARTHROSCOPY RIGHT KNEE WITH MEDIAL AND LATERAL MENSICAL DEBRIDEMENT (Right) - LMA  Patient Location: PACU  Anesthesia Type:General  Level of Consciousness: awake, alert , oriented and patient cooperative  Airway & Oxygen Therapy: Patient Spontanous Breathing and Patient connected to face mask oxygen  Post-op Assessment: Report given to RN, Post -op Vital signs reviewed and stable and Patient moving all extremities  Post vital signs: Reviewed and stable  Last Vitals:  Vitals:   06/30/16 0705  BP: 139/72  Pulse: 88  Resp: 18  Temp: 36.8 C    Last Pain:  Vitals:   06/30/16 0734  TempSrc:   PainSc: 5       Patients Stated Pain Goal: 3 ([id number])  Complications: No apparent anesthesia complications

## 2016-06-30 NOTE — Progress Notes (Signed)
Dr. Lauretta Grill notified of patient's CBG of 302. She stated "bring patient on down to holding and will take care of it there."

## 2016-06-30 NOTE — Anesthesia Procedure Notes (Signed)
Procedure Name: LMA Insertion Date/Time: 06/30/2016 8:46 AM Performed by: Carleene Cooper A Pre-anesthesia Checklist: Patient identified, Timeout performed, Emergency Drugs available, Suction available and Patient being monitored Patient Re-evaluated:Patient Re-evaluated prior to inductionOxygen Delivery Method: Circle system utilized Preoxygenation: Pre-oxygenation with 100% oxygen Intubation Type: IV induction Ventilation: Mask ventilation without difficulty LMA: LMA with gastric port inserted LMA Size: 4.0 Number of attempts: 1 Placement Confirmation: positive ETCO2 and breath sounds checked- equal and bilateral Tube secured with: Tape Dental Injury: Teeth and Oropharynx as per pre-operative assessment

## 2016-07-13 ENCOUNTER — Other Ambulatory Visit: Payer: Self-pay | Admitting: *Deleted

## 2016-07-13 ENCOUNTER — Telehealth: Payer: Self-pay | Admitting: *Deleted

## 2016-07-13 DIAGNOSIS — G4733 Obstructive sleep apnea (adult) (pediatric): Secondary | ICD-10-CM

## 2016-07-13 NOTE — Telephone Encounter (Signed)
Patient notified of sleep study results and recommendations. She voiced understanding of these recommendations.

## 2016-07-13 NOTE — Telephone Encounter (Signed)
-----   Message from Troy Sine, MD sent at 07/10/2016 11:13 PM EDT ----- Moderate to severe sleep apnea on study; schedule pt for CPAP titration study.

## 2016-07-30 ENCOUNTER — Telehealth: Payer: Self-pay | Admitting: *Deleted

## 2016-07-30 NOTE — Telephone Encounter (Signed)
Called patient to inform her that her healthcare insurance has denied her CPAP titration study. Ordered a 2 week auto titration to be done through [facility]. Patient states that she will be calling [facility] to see how  Much it will cost out of pocket to do the titration study. She would rather the information be collected in 1 night. If she finds that it costs too much she will proceed with the 2 week auto titration.

## 2016-08-17 ENCOUNTER — Encounter: Payer: Self-pay | Admitting: Cardiovascular Disease

## 2016-08-31 ENCOUNTER — Encounter (HOSPITAL_BASED_OUTPATIENT_CLINIC_OR_DEPARTMENT_OTHER): Payer: 59

## 2016-09-20 ENCOUNTER — Other Ambulatory Visit: Payer: Self-pay | Admitting: Internal Medicine

## 2016-09-20 DIAGNOSIS — R0989 Other specified symptoms and signs involving the circulatory and respiratory systems: Secondary | ICD-10-CM

## 2016-09-27 ENCOUNTER — Ambulatory Visit
Admission: RE | Admit: 2016-09-27 | Discharge: 2016-09-27 | Disposition: A | Discharge status: Home / Self Care | Payer: 59 | Source: Ambulatory Visit | Attending: Internal Medicine | Admitting: Internal Medicine

## 2016-09-27 ENCOUNTER — Other Ambulatory Visit: Payer: 59

## 2016-09-27 DIAGNOSIS — R0989 Other specified symptoms and signs involving the circulatory and respiratory systems: Secondary | ICD-10-CM

## 2016-09-29 ENCOUNTER — Telehealth: Payer: Self-pay | Admitting: Cardiology

## 2016-09-29 NOTE — Telephone Encounter (Signed)
Received records from [facility] for appointment on 10/06/16 with Kerin Ransom, [place].  Records given to [employer] (medical records) for Luke's schedule on 10/06/16. lp

## 2016-10-06 ENCOUNTER — Encounter: Payer: Self-pay | Admitting: Cardiology

## 2016-10-06 ENCOUNTER — Ambulatory Visit (INDEPENDENT_AMBULATORY_CARE_PROVIDER_SITE_OTHER): Payer: 59 | Admitting: Cardiology

## 2016-10-06 VITALS — BP 154/78 | HR 71 | Ht 66.0 in | Wt 273.0 lb

## 2016-10-06 DIAGNOSIS — Z794 Long term (current) use of insulin: Secondary | ICD-10-CM

## 2016-10-06 DIAGNOSIS — R609 Edema, unspecified: Secondary | ICD-10-CM

## 2016-10-06 DIAGNOSIS — I6529 Occlusion and stenosis of unspecified carotid artery: Secondary | ICD-10-CM

## 2016-10-06 DIAGNOSIS — I739 Peripheral vascular disease, unspecified: Secondary | ICD-10-CM

## 2016-10-06 DIAGNOSIS — E119 Type 2 diabetes mellitus without complications: Secondary | ICD-10-CM

## 2016-10-06 DIAGNOSIS — I10 Essential (primary) hypertension: Secondary | ICD-10-CM

## 2016-10-06 DIAGNOSIS — G4733 Obstructive sleep apnea (adult) (pediatric): Secondary | ICD-10-CM

## 2016-10-06 DIAGNOSIS — E785 Hyperlipidemia, unspecified: Secondary | ICD-10-CM

## 2016-10-06 DIAGNOSIS — Z0389 Encounter for observation for other suspected diseases and conditions ruled out: Secondary | ICD-10-CM

## 2016-10-06 DIAGNOSIS — IMO0001 Reserved for inherently not codable concepts without codable children: Secondary | ICD-10-CM

## 2016-10-06 DIAGNOSIS — I779 Disorder of arteries and arterioles, unspecified: Secondary | ICD-10-CM

## 2016-10-06 DIAGNOSIS — I2721 Secondary pulmonary arterial hypertension: Secondary | ICD-10-CM

## 2016-10-06 NOTE — Patient Instructions (Addendum)
Medication Instructions:  Your physician recommends that you continue on your current medications as directed. Please refer to the Current Medication list given to you today.   Labwork: None ordered  Testing/Procedures: Your physician has requested that you have a carotid duplex. This test is an ultrasound of the carotid arteries in your neck. It looks at blood flow through these arteries that supply the brain with blood. Allow one hour for this exam. There are no restrictions or special instructions. ( To be scheduled in 6 months)  Follow-Up: Your physician wants you to follow-up in: 6 months with Dr.Croitoru You will receive a reminder letter in the mail two months in advance. If you don't receive a letter, please call our office to schedule the follow-up appointment.   Any Other Special Instructions Will Be Listed Below (If Applicable).     If you need a refill on your cardiac medications before your next appointment, please call your pharmacy.

## 2016-10-06 NOTE — Progress Notes (Signed)
10/06/2016 Sue Davidson   [birth year]  [id number]  Primary Physician Horatio Pel, MD Primary Cardiologist: Dr Sallyanne Kuster  HPI:   65 y.o. morbidly obese female with chronic bilateral lower extremity edema and exertional dyspnea.  Echocardiography April 2017 showed normal left ventricular systolic function and minimum signs of diastolic dysfunction with normal filling pressures, but did show mild pulmonary hypertension with PA pressure 38.  Other problems include type 2 diabetes mellitus that requires treatment with insulin (Dr. Chalmers Cater), treated hyperlipidemia, treated hypertension, treated hypothyroidism. She was diagnosed many years ago with obstructive sleep apnea (sleep study performed in 2007), but has never been able to tolerate treatment with CPAP. She underwent cardiac catheterization most recently in October 2012 which showed no evidence of coronary obstructive disease. Left ventricular end-diastolic pressure was 10 mmHg. 2 years earlier in 2010 she had right and left heart catheterization that showed a pulmonary artery wedge pressure of 6 and a PA pressure of 22/9. Prior to that she had normal coronary angiograms performed in 1997 and in 2003.   She is in the office today after Dr Pennie Banter APP student picked up a carotid bruit. CA dopplers show bilateral 50-69% carotid stenosis. She is asymptomatic. She denies any history of TIA or CVA.    Current Outpatient Prescriptions  Medication Sig Dispense Refill  . aspirin 81 MG tablet Take 81 mg by mouth daily.    . furosemide (LASIX) 40 MG tablet Take 40-80 mg by mouth daily. Alternates taking 40mg  then 80mg .    . HUMALOG MIX 75/25 (75-25) 100 UNIT/ML SUSP injection Inject 15-85 Units into the skin 3 (three) times daily with meals. 65 units every morning. 15-25 units at lunch on a sliding scale depending on blood sugar. 85 units with supper.    Marland Kitchen HYDROcodone-acetaminophen (NORCO) 5-325 MG tablet Take 1-2 tablets by mouth every 4 (four)  hours as needed for moderate pain. 30 tablet 0  . ibuprofen (ADVIL,MOTRIN) 200 MG tablet Take 400 mg by mouth at bedtime.    Marland Kitchen levothyroxine (SYNTHROID, LEVOTHROID) 75 MCG tablet Take 75 mcg by mouth daily before breakfast.    . methocarbamol (ROBAXIN) 500 MG tablet Take 1 tablet (500 mg total) by mouth 4 (four) times daily. As needed for muscle spasm 30 tablet 1  . olmesartan (BENICAR) 40 MG tablet Take 20 mg by mouth daily with breakfast.    . omeprazole (PRILOSEC) 40 MG capsule Take 40 mg by mouth daily. Reported on 01/19/2016    . ondansetron (ZOFRAN) 8 MG tablet Take 8 mg by mouth 2 (two) times daily as needed for nausea or vomiting.    . ONE TOUCH ULTRA TEST test strip     . rosuvastatin (CRESTOR) 20 MG tablet Take 10 mg by mouth daily with breakfast.     No current facility-administered medications for this visit.     Allergies  Allergen Reactions  . Tape Rash    Social History   Social History  . Marital status: Married    Spouse name: N/A  . Number of children: N/A  . Years of education: N/A   Occupational History  . Not on file.   Social History Main Topics  . Smoking status: Never Smoker  . Smokeless tobacco: Never Used  . Alcohol use No  . Drug use: No  . Sexual activity: Yes    Birth control/ protection: Spermicide   Other Topics Concern  . Not on file   Social History Narrative   Epworth Sleepiness Scale Score:  15      --I have HTN   --I have had Insomnia   --I feel stressed and lack motivation   --I have Diabetes   --I am overweight or am gaining weight   --I awake feeling not rested        Review of Systems: General: negative for chills, fever, night sweats or weight changes.  Cardiovascular: negative for chest pain, dyspnea on exertion, edema, orthopnea, palpitations, paroxysmal nocturnal dyspnea or shortness of breath Dermatological: negative for rash Respiratory: negative for cough or wheezing Urologic: negative for hematuria Abdominal:  negative for nausea, vomiting, diarrhea, bright red blood per rectum, melena, or hematemesis Neurologic: negative for visual changes, syncope, or dizziness All other systems reviewed and are otherwise negative except as noted above.    Blood pressure (!) 154/78, pulse 71, height 5\' 6"  (1.676 m), weight 273 lb (123.8 kg).  General appearance: alert, cooperative, no distress and morbidly obese Neck: no carotid bruit and no JVD Lungs: clear to auscultation bilaterally Heart: regular rate and rhythm Extremities: chronic venous skin changes Skin: Skin color, texture, turgor normal. No rashes or lesions Neurologic: Grossly normal   ASSESSMENT AND PLAN:   Carotid disease, bilateral (HCC) 50-69% bilateral carotid stenosis by doppler- asymptomatic  Morbid obesity (HCC) BMI 44.6  OSA (obstructive sleep apnea) C-pap intol  Essential hypertension controlled  Dyslipidemia On statin, followed by PCP  Insulin dependent diabetes mellitus (HCC) Recent Hgb A1c-9.6  Normal coronary arteries Cath 2012  EDEMA LEG Chronic LE edema, venous dopplers negative march 2017   PLAN  Discussed with Dr Sallyanne Kuster- ASA, treatment of HTN and HLD, and  f/u dopplers in 6 months. Discussed exercise (walking) and diet with pt and her daughter.   Kerin Ransom PA-C 10/06/2016 8:30 AM

## 2016-10-06 NOTE — Assessment & Plan Note (Signed)
C-pap intol 

## 2016-10-06 NOTE — Assessment & Plan Note (Signed)
BMI 44.6

## 2016-10-06 NOTE — Assessment & Plan Note (Signed)
Cath 2012

## 2016-10-06 NOTE — Assessment & Plan Note (Signed)
50-69% bilateral carotid stenosis by doppler- asymptomatic

## 2016-10-06 NOTE — Assessment & Plan Note (Signed)
controlled 

## 2016-10-06 NOTE — Assessment & Plan Note (Signed)
Chronic LE edema, venous dopplers negative march 2017

## 2016-10-06 NOTE — Assessment & Plan Note (Signed)
On statin, followed by PCP.

## 2016-10-06 NOTE — Assessment & Plan Note (Signed)
Recent Hgb A1c-9.6

## 2016-10-22 ENCOUNTER — Ambulatory Visit (INDEPENDENT_AMBULATORY_CARE_PROVIDER_SITE_OTHER): Payer: 59 | Admitting: Cardiovascular Disease

## 2016-10-22 ENCOUNTER — Encounter: Payer: Self-pay | Admitting: Cardiovascular Disease

## 2016-10-22 VITALS — BP 162/65 | HR 87 | Ht 66.0 in | Wt 273.0 lb

## 2016-10-22 DIAGNOSIS — I739 Peripheral vascular disease, unspecified: Secondary | ICD-10-CM

## 2016-10-22 DIAGNOSIS — I272 Pulmonary hypertension, unspecified: Secondary | ICD-10-CM

## 2016-10-22 DIAGNOSIS — I779 Disorder of arteries and arterioles, unspecified: Secondary | ICD-10-CM | POA: Diagnosis not present

## 2016-10-22 DIAGNOSIS — I10 Essential (primary) hypertension: Secondary | ICD-10-CM

## 2016-10-22 DIAGNOSIS — G4733 Obstructive sleep apnea (adult) (pediatric): Secondary | ICD-10-CM

## 2016-10-22 NOTE — Patient Instructions (Addendum)
Your physician wants you to follow-up in: As needed with Dr Claiborne Billings for sleep.You will receive a reminder letter in the mail two months in advance. If you don't receive a letter, please call our office to schedule the follow-up appointment.

## 2016-10-23 NOTE — Progress Notes (Signed)
Patient ID: Sue Davidson, female   DOB: [birth year], 65 y.o.   MRN: [id number]    Primary M.D.: Dr. Deland Pretty Primary cardiologist: Dr. Sallyanne Kuster  HPI: Sue Davidson is a 65 y.o. female who presents for follow-up sleep clinic evaluation Sue Davidson is a former patient of Dr. Chase Picket.  She had undergone remote cardiac catheterization in 2012 and was not found to have coronary obstructive disease.  She has a history of type 2 diabetes mellitus on insulin therapy, hyperlipidemia, hypertension, as well as hypothyroidism for which she has been on medical therapy.  In 2007 she underwent a sleep study.  She was started on CPAP therapy at that time but never really use this consistently.  She was referred to me in May 2017 for sleep clinic evaluation.  At that time she typically was going to bed at 10 PM and waking up at 5 AM.  She snores loudly.  She admits to daytime sleepiness.  Her sleep is poor with frequent awakenings.  She has non-restorative sleep.  She believes that she only is able to sleep about 2-3 hours per night despite being in bed for 7 hours.  She is bothered by knee discomfort.  Remotely, she had difficulty with mask leak.  She denied any episodes of chest pain or palpitations.  Epworth Sleepiness Scale: Situation   Chance of Dozing/Sleeping (0 = never , 1 = slight chance , 2 = moderate chance , 3 = high chance )   sitting and reading 1   watching TV 2   sitting inactive in a public place 1   being a passenger in a motor vehicle for an hour or more 1   lying down in the afternoon 3   sitting and talking to someone 1   sitting quietly after lunch (no alcohol) 1   while stopped for a few minutes in traffic as the driver 1   Total Score  11   She was referred for a new sleep study, but unfortunately [facility] denied her in facility sleep study. She underwent a home study through [facility] on 06/23/2016.  It was interpreted by Dr. Irma Newness and she was felt to have moderate sleep  apnea.  She underwent an AutoPap titration and a download from 08/06/2016 through 09/04/2016 showed 93% of usage stays with 83% of usage greater than 4 hours.  Her 95th percentile.  Average pressure was 9.9 cm with a maximum average pressure of 11.  Her AHI was excellent at 0.5.  She is using a ResMed AirFit P 10 small mask.  She  had hand surgery 10 days ago and is  in a left arm cast.  Past Medical History:  Diagnosis Date  . Anemia    hx of  . Anginal pain (HCC)    hx of  . Anxiety   . Depression   . Diabetes mellitus without complication ([place])   . GERD (gastroesophageal reflux disease)   . Headache(784.0)   . Heart murmur   . Hyperlipidemia   . Hypertension   . MRSA (methicillin resistant staph aureus) culture positive    many years ago-abdominal wound- no issuses since.  Marland Kitchen PONV (postoperative nausea and vomiting)    severe  . Sleep apnea    Study done -remains under evaluation- no cpap yet.  . Vitamin D deficiency     Past Surgical History:  Procedure Laterality Date  . ABDOMINAL HYSTERECTOMY  1970's  . APPENDECTOMY  1970's  . CARDIAC CATHETERIZATION    .  CHOLECYSTECTOMY N/A 10/02/2013   Procedure: LAPAROSCOPIC CHOLECYSTECTOMY;  Surgeon: Earnstine Regal, MD;  Location: [facility];  Service: General;  Laterality: N/A;  . cyst removed Left    wrist"ganglion"  . ELBOW SURGERY Left   . HARDWARE REMOVAL Left    ankle  . KNEE ARTHROSCOPY Right 06/30/2016   Procedure: ARTHROSCOPY RIGHT KNEE WITH MEDIAL AND LATERAL MENSICAL DEBRIDEMENT;  Surgeon: Gaynelle Arabian, MD;  Location: [facility];  Service: Orthopedics;  Laterality: Right;  LMA  . LEG SURGERY Left 1980's   broke leg and ankle  . SHOULDER ARTHROSCOPY Bilateral   . TONSILLECTOMY  1970's  . TUBAL LIGATION  1970's    Allergies  Allergen Reactions  . Tape Rash    Current Outpatient Prescriptions  Medication Sig Dispense Refill  . Ascorbic Acid (VITAMIN C) 1000 MG tablet Take 1,000 mg by mouth daily.    Marland Kitchen aspirin 81 MG tablet  Take 81 mg by mouth daily.    . furosemide (LASIX) 40 MG tablet Take 40-80 mg by mouth daily. Alternates taking 5m then 851m    . Marland KitchenUMALOG MIX 75/25 (75-25) 100 UNIT/ML SUSP injection Inject 15-85 Units into the skin 3 (three) times daily with meals. 65 units every morning. 15-25 units at lunch on a sliding scale depending on blood sugar. 85 units with supper.    . Marland KitchenYDROcodone-acetaminophen (NORCO) 5-325 MG tablet Take 1-2 tablets by mouth every 4 (four) hours as needed for moderate pain. 30 tablet 0  . ibuprofen (ADVIL,MOTRIN) 200 MG tablet Take 400 mg by mouth at bedtime.    . Marland Kitchenevothyroxine (SYNTHROID, LEVOTHROID) 75 MCG tablet Take 75 mcg by mouth daily before breakfast.    . methocarbamol (ROBAXIN) 500 MG tablet Take 1 tablet (500 mg total) by mouth 4 (four) times daily. As needed for muscle spasm 30 tablet 1  . olmesartan (BENICAR) 40 MG tablet Take 20 mg by mouth daily with breakfast.    . omeprazole (PRILOSEC) 40 MG capsule Take 40 mg by mouth daily. Reported on 01/19/2016    . ondansetron (ZOFRAN) 8 MG tablet Take 8 mg by mouth 2 (two) times daily as needed for nausea or vomiting.    . ONE TOUCH ULTRA TEST test strip     . pyridoxine (B-6) 200 MG tablet Take 200 mg by mouth daily.    . rosuvastatin (CRESTOR) 20 MG tablet Take 10 mg by mouth daily with breakfast.     No current facility-administered medications for this visit.     Social History   Social History  . Marital status: Married    Spouse name: N/A  . Number of children: N/A  . Years of education: N/A   Occupational History  . Not on file.   Social History Main Topics  . Smoking status: Never Smoker  . Smokeless tobacco: Never Used  . Alcohol use No  . Drug use: No  . Sexual activity: Yes    Birth control/ protection: Spermicide   Other Topics Concern  . Not on file   Social History Narrative   Epworth Sleepiness Scale Score:  15      --I have HTN   --I have had Insomnia   --I feel stressed and lack  motivation   --I have Diabetes   --I am overweight or am gaining weight   --I awake feeling not rested      Additional social history is notable [place] She has a daughter who has CAD  and was morbidly obese.  Recently loss 130 pounds.  She has a son with diabetes mellitus.  Family History  Problem Relation Age of Onset  . Diabetes Mother   . Heart disease Mother   . Cancer Father     throat and lung  . Cancer Paternal Aunt     breast  . Cancer Paternal Aunt     colon   Aditional family history is notable that it is her father died at age 63 and had previously undergone CABG surgery and had a stroke.  Mother died at age 49 from sepsis initiating as cellulitis.  One brother has undergone CABG surgery.  She has a total of 3 living brothers and 1 sister.  ROS General: Negative; No fevers, chills, or night sweats HEENT: Negative; No changes in vision or hearing, sinus congestion, difficulty swallowing Pulmonary: Negative; No cough, wheezing, shortness of breath, hemoptysis Cardiovascular: Negative; No chest pain, presyncope, syncope, palpatations GI: Negative; No nausea, vomiting, diarrhea, or abdominal pain GU: Negative; No dysuria, hematuria, or difficulty voiding Musculoskeletal: Negative; no myalgias, joint pain, or weakness Hematologic: Negative; no easy bruising, bleeding Endocrine: Positive for hypothyroidism and diabetes mellitus. Neuro: Negative; no changes in balance, headaches Skin: Negative; No rashes or skin lesions Psychiatric: Negative; No behavioral problems, depression Sleep: Positive for OSA, now on sleep apnea with previous  poor sleep, loud snoring, frequent awakenings daytime sleepiness, hypersomnolence have resolved since initiating CPAP therapy;  No bruxism, restless legs, hypnogognic hallucinations, no cataplexy   Physical Exam BP (!) 162/65   Pulse 87   Ht _0  (1.676 m)   Wt 273 lb (123.8 kg)   BMI 44.06 kg/m   Body  mass index compatible with morbid obesity  Wt Readings from Last 3 Encounters:  10/22/16 273 lb (123.8 kg)  10/06/16 273 lb (123.8 kg)  06/30/16 275 lb (124.7 kg)   General: Alert, oriented, no distress.  Skin: normal turgor, no rashes HEENT: Normocephalic, atraumatic. Pupils round and reactive; sclera anicteric; extraocular muscles intact; Fundi Without hemorrhages or exudates Nose without nasal septal hypertrophy Mouth/Parynx benign; Mallinpatti scale 3 Neck: No JVD, no carotid briuts Lungs: clear to ausculatation and percussion; no wheezing or rales  Chest wall: No tenderness to palpation Heart: RRR, s1 s2 normal; 1/6 systolic murmur.  No S3 gallop.  No diastolic murmur.  No rubs thrills or heaves. Abdomen: Moderate central adiposity ;soft, nontender; no hepatosplenomehaly, BS+; abdominal aorta nontender and not dilated by palpation. Back: No CVA tenderness Pulses 2+ Extremities: 1+ edema, left greater than right ankle.  no clubbing cyanosis, Homan's sign negative  Neurologic: grossly nonfocal; cranial nerves intact. Psychological: Normal affect and mood.  Not done today, but the 01/29/2016 ECG was reviewed (independently read by me): Sinus rhythm at 67, left anterior hemiblock.  LABS:  BMP Latest Ref Rng & Units 06/22/2016 09/24/2013 06/18/2013  Glucose 65 - 99 mg/dL 129(H) 77 159(H)  BUN 6 - 20 mg/dL _1 Creatinine 0.44 - 1.00 mg/dL 0.76 0.95 0.94  Sodium 135 - 145 mmol/L 139 143 137  Potassium 3.5 - 5.1 mmol/L 4.1 4.6 4.1  Chloride 101 - 111 mmol/L 98(L) 104 104  CO2 22 - 32 mmol/L 32 30 30  Calcium 8.9 - 10.3 mg/dL 9.5 10.2 9.0     Hepatic Function Latest Ref Rng & Units 06/18/2013 07/14/2011  Total Protein 6.4 - 8.2 g/dL 7.7 8.0  Albumin 3.4 - 5.0 g/dL 3.2(L) 3.7  AST 15 - 37 Unit/L 25 22  ALT 12 - 78 U/L 20 19  Alk Phosphatase 50 - 136 Unit/L 92 93  Total Bilirubin 0.2 - 1.0 mg/dL 0.3 0.3     CBC Latest Ref Rng & Units 06/22/2016 09/24/2013 06/18/2013  WBC  4.0 - 10.5 K/uL 10.3 8.6 7.7  Hemoglobin 12.0 - 15.0 g/dL 13.1 11.9(L) 12.5  Hematocrit 36.0 - 46.0 % 40.2 37.8 36.7  Platelets 150 - 400 K/uL 251 236 230     Lipid Panel     Component Value Date/Time   CHOL 225 (H) 07/15/2011 0653   TRIG 186 (H) 07/15/2011 0653   HDL 30 (L) 07/15/2011 0653   CHOLHDL 7.5 07/15/2011 0653   VLDL 37 07/15/2011 0653   LDLCALC 158 (H) 07/15/2011 0653     RADIOLOGY: US Carotid Bilateral  Result Date: 09/27/2016 CLINICAL DATA:  Right neck bruit EXAM: BILATERAL CAROTID DUPLEX ULTRASOUND TECHNIQUE: Pearline Cables scale imaging, color Doppler and duplex ultrasound were performed of bilateral carotid and vertebral arteries in the neck. COMPARISON:  None. FINDINGS: Criteria: Quantification of carotid stenosis is based on velocity parameters that correlate the residual internal carotid diameter with NASCET-based stenosis levels, using the diameter of the distal internal carotid lumen as the denominator for stenosis measurement. The following velocity measurements were obtained: RIGHT ICA:  133 cm/sec CCA:  470 cm/sec SYSTOLIC ICA/CCA RATIO:  1.2 DIASTOLIC ICA/CCA RATIO:  1.7 ECA:  135 cm/sec LEFT ICA:  131 cm/sec CCA:  962 cm/sec SYSTOLIC ICA/CCA RATIO:  1.1 DIASTOLIC ICA/CCA RATIO:  2.5 ECA:  124 cm/sec RIGHT CAROTID ARTERY: Moderate calcified plaque in the bulb. Low resistance internal carotid Doppler pattern is preserved. RIGHT VERTEBRAL ARTERY:  Antegrade. LEFT CAROTID ARTERY: Moderate mixed plaque in the distal common carotid and bulb. Low resistance internal carotid Doppler pattern is preserved. LEFT VERTEBRAL ARTERY:  Antegrade. IMPRESSION: There is 50-69% stenosis in the right and left internal carotid arteries. Electronically Signed   By: Marybelle Killings M.D.   On: 09/27/2016 15:42    IMPRESSION:  1. OSA (obstructive sleep apnea)   2. Essential hypertension   3. Carotid disease, bilateral ([place])   4. Mild pulmonary hypertension   5. Morbid obesity [facility])      ASSESSMENT AND PLAN: Sue Davidson is a 65 year old female who remotely seen Dr. Chase Picket, and most recently Dr. Sallyanne Kuster for cardiology care.  She was diagnosed as having obstructive sleep apnea in 2007 but only used CPAP for minimal time.  She has cardiovascular comorbidities including hypertension, diabetes mellitus, and hyperlipidemia and also has a history of GERD as well as hypothyroidism.  [place] [facility] had denied the sleep study that we had ordered to be done at [facility] long sleep lab facility.  As result, a home study was done which was not interpreted by me and by report was interpreted as moderate sleep apnea.  Her RDI was 31.1.  Her apnea index was 0.9 per hour.  She has a auto CPAP unit and average pressure is just under 10 cm water pressure.  Her AHI is excellent at 0.5.  He is not having significant leaks and has a small nasal pillow mask.  I discussed with her the need for improved sleep duration since her average usage is only 5 hours and 55 minutes.  I discussed with her optimal sleeping ideally 8 hours of sleep per night.  I discussed the effects of untreated sleep apnea on cardiovascular health in the importance of using CPAP through the nights entirety for optimal benefit.  He also  has mild carotid disease and I reviewed her most recent carotid Doppler study with her, which demonstrated a 50-69% stenosis in the right and left internal carotid arteries.  He is morbidly obese with a BMI of 44.06.  I discussed with her the importance of weight loss and the effect of obesity on sleep apnea.  A prior echo Doppler study had shown mild pulmonary hypertension, which is associated with sleep apnea.  She will return to the cardiology care of Dr. Sallyanne Kuster.  Time spent: 25 minutes  Troy Sine, MD, [facility]  10/23/2016 1:50 PM

## 2016-11-08 HISTORY — PX: BREAST BIOPSY: SHX20

## 2016-11-10 DIAGNOSIS — Z4789 Encounter for other orthopedic aftercare: Secondary | ICD-10-CM | POA: Diagnosis not present

## 2016-11-10 DIAGNOSIS — M1812 Unilateral primary osteoarthritis of first carpometacarpal joint, left hand: Secondary | ICD-10-CM | POA: Diagnosis not present

## 2016-11-26 DIAGNOSIS — Z4789 Encounter for other orthopedic aftercare: Secondary | ICD-10-CM | POA: Diagnosis not present

## 2016-11-26 DIAGNOSIS — M79642 Pain in left hand: Secondary | ICD-10-CM | POA: Diagnosis not present

## 2016-11-29 DIAGNOSIS — E1165 Type 2 diabetes mellitus with hyperglycemia: Secondary | ICD-10-CM | POA: Diagnosis not present

## 2016-11-29 DIAGNOSIS — E039 Hypothyroidism, unspecified: Secondary | ICD-10-CM | POA: Diagnosis not present

## 2016-11-29 DIAGNOSIS — I10 Essential (primary) hypertension: Secondary | ICD-10-CM | POA: Diagnosis not present

## 2016-11-30 DIAGNOSIS — M79642 Pain in left hand: Secondary | ICD-10-CM | POA: Diagnosis not present

## 2016-12-03 DIAGNOSIS — M79642 Pain in left hand: Secondary | ICD-10-CM | POA: Diagnosis not present

## 2016-12-06 DIAGNOSIS — G4733 Obstructive sleep apnea (adult) (pediatric): Secondary | ICD-10-CM | POA: Diagnosis not present

## 2016-12-14 DIAGNOSIS — M79642 Pain in left hand: Secondary | ICD-10-CM | POA: Diagnosis not present

## 2016-12-17 DIAGNOSIS — M79642 Pain in left hand: Secondary | ICD-10-CM | POA: Diagnosis not present

## 2016-12-21 DIAGNOSIS — M79642 Pain in left hand: Secondary | ICD-10-CM | POA: Diagnosis not present

## 2016-12-24 DIAGNOSIS — M1812 Unilateral primary osteoarthritis of first carpometacarpal joint, left hand: Secondary | ICD-10-CM | POA: Diagnosis not present

## 2016-12-24 DIAGNOSIS — M79642 Pain in left hand: Secondary | ICD-10-CM | POA: Diagnosis not present

## 2016-12-24 DIAGNOSIS — M65332 Trigger finger, left middle finger: Secondary | ICD-10-CM | POA: Diagnosis not present

## 2016-12-24 DIAGNOSIS — M7542 Impingement syndrome of left shoulder: Secondary | ICD-10-CM | POA: Diagnosis not present

## 2016-12-27 ENCOUNTER — Other Ambulatory Visit: Payer: Self-pay | Admitting: Orthopedic Surgery

## 2016-12-27 DIAGNOSIS — M1812 Unilateral primary osteoarthritis of first carpometacarpal joint, left hand: Secondary | ICD-10-CM

## 2016-12-28 ENCOUNTER — Other Ambulatory Visit: Payer: Self-pay | Admitting: Orthopedic Surgery

## 2016-12-28 DIAGNOSIS — M7542 Impingement syndrome of left shoulder: Secondary | ICD-10-CM

## 2016-12-29 DIAGNOSIS — M79642 Pain in left hand: Secondary | ICD-10-CM | POA: Diagnosis not present

## 2017-01-02 ENCOUNTER — Ambulatory Visit
Admission: RE | Admit: 2017-01-02 | Discharge: 2017-01-02 | Disposition: A | Discharge status: Home / Self Care | Payer: 59 | Source: Ambulatory Visit | Attending: Orthopedic Surgery | Admitting: Orthopedic Surgery

## 2017-01-02 DIAGNOSIS — M7542 Impingement syndrome of left shoulder: Secondary | ICD-10-CM

## 2017-01-05 DIAGNOSIS — G4733 Obstructive sleep apnea (adult) (pediatric): Secondary | ICD-10-CM | POA: Diagnosis not present

## 2017-01-05 DIAGNOSIS — M79642 Pain in left hand: Secondary | ICD-10-CM | POA: Diagnosis not present

## 2017-01-12 DIAGNOSIS — R509 Fever, unspecified: Secondary | ICD-10-CM | POA: Diagnosis not present

## 2017-01-12 DIAGNOSIS — R05 Cough: Secondary | ICD-10-CM | POA: Diagnosis not present

## 2017-01-12 DIAGNOSIS — R6889 Other general symptoms and signs: Secondary | ICD-10-CM | POA: Diagnosis not present

## 2017-01-19 DIAGNOSIS — Z4789 Encounter for other orthopedic aftercare: Secondary | ICD-10-CM | POA: Diagnosis not present

## 2017-01-19 DIAGNOSIS — M79642 Pain in left hand: Secondary | ICD-10-CM | POA: Diagnosis not present

## 2017-01-25 DIAGNOSIS — Z79899 Other long term (current) drug therapy: Secondary | ICD-10-CM | POA: Diagnosis not present

## 2017-02-03 DIAGNOSIS — G4733 Obstructive sleep apnea (adult) (pediatric): Secondary | ICD-10-CM | POA: Diagnosis not present

## 2017-02-08 DIAGNOSIS — Z4789 Encounter for other orthopedic aftercare: Secondary | ICD-10-CM | POA: Diagnosis not present

## 2017-02-08 DIAGNOSIS — M1711 Unilateral primary osteoarthritis, right knee: Secondary | ICD-10-CM | POA: Diagnosis not present

## 2017-02-18 DIAGNOSIS — M1711 Unilateral primary osteoarthritis, right knee: Secondary | ICD-10-CM | POA: Diagnosis not present

## 2017-02-22 ENCOUNTER — Other Ambulatory Visit: Payer: Self-pay | Admitting: Internal Medicine

## 2017-02-22 DIAGNOSIS — Z1231 Encounter for screening mammogram for malignant neoplasm of breast: Secondary | ICD-10-CM

## 2017-02-24 DIAGNOSIS — M1812 Unilateral primary osteoarthritis of first carpometacarpal joint, left hand: Secondary | ICD-10-CM | POA: Diagnosis not present

## 2017-02-24 DIAGNOSIS — Z4789 Encounter for other orthopedic aftercare: Secondary | ICD-10-CM | POA: Diagnosis not present

## 2017-02-25 DIAGNOSIS — M1711 Unilateral primary osteoarthritis, right knee: Secondary | ICD-10-CM | POA: Diagnosis not present

## 2017-02-25 DIAGNOSIS — M25561 Pain in right knee: Secondary | ICD-10-CM | POA: Diagnosis not present

## 2017-02-28 ENCOUNTER — Telehealth: Payer: Self-pay | Admitting: Cardiovascular Disease

## 2017-02-28 NOTE — Telephone Encounter (Signed)
Returned call to patient-reports increased swelling to BL LE x 2 weeks.  Reports swelling to hands and "a little" to abdomen.  Reports some SOB over the weekend, denies at current.  Denies increase in sodium intake.  Reports she takes 40mg  lasix and 80 mg lasix, alternating days.   Reports she weighs herself every morning and it has either stayed the same or increased.  Weight this AM 274 lbs.    Advised I would route to MD for recommendations.  Advised to monitor salt intake and elevate when possible.

## 2017-02-28 NOTE — Telephone Encounter (Signed)
Please also confirm that she is using her CPAP as prescribed. In addition to keeping the legs elevated, we had recommended compression stockings. If overall weight hasn't changed much, it's possible she only has local swelling due to venous insufficiency and does not necessarily need extra diuretic.

## 2017-02-28 NOTE — Telephone Encounter (Signed)
New message    Pt c/o swelling: STAT is pt has developed SOB within 24 hours  1. How long have you been experiencing swelling? About two week    2. Where is the swelling located?  Lower /upper - both legs   3.  Are you currently taking a "fluid pill"? Yes  - two pills taken today   4.  Are you currently SOB? A little over the weekend , not today    5.  Have you traveled recently?no

## 2017-02-28 NOTE — Telephone Encounter (Signed)
Patient aware of recommendations-verbalized understanding.  Will try compression stockings and elevation and will return call if no improvement of symptoms become worse.

## 2017-03-06 DIAGNOSIS — G4733 Obstructive sleep apnea (adult) (pediatric): Secondary | ICD-10-CM | POA: Diagnosis not present

## 2017-03-25 ENCOUNTER — Ambulatory Visit
Admission: RE | Admit: 2017-03-25 | Discharge: 2017-03-25 | Disposition: A | Discharge status: Home / Self Care | Payer: 59 | Source: Ambulatory Visit | Attending: Internal Medicine | Admitting: Internal Medicine

## 2017-03-25 DIAGNOSIS — E1165 Type 2 diabetes mellitus with hyperglycemia: Secondary | ICD-10-CM | POA: Diagnosis not present

## 2017-03-25 DIAGNOSIS — Z1231 Encounter for screening mammogram for malignant neoplasm of breast: Secondary | ICD-10-CM

## 2017-03-25 DIAGNOSIS — Z0000 Encounter for general adult medical examination without abnormal findings: Secondary | ICD-10-CM | POA: Diagnosis not present

## 2017-03-25 DIAGNOSIS — E039 Hypothyroidism, unspecified: Secondary | ICD-10-CM | POA: Diagnosis not present

## 2017-03-29 ENCOUNTER — Other Ambulatory Visit: Payer: Self-pay | Admitting: Internal Medicine

## 2017-03-29 DIAGNOSIS — R928 Other abnormal and inconclusive findings on diagnostic imaging of breast: Secondary | ICD-10-CM

## 2017-03-31 DIAGNOSIS — Z0000 Encounter for general adult medical examination without abnormal findings: Secondary | ICD-10-CM | POA: Diagnosis not present

## 2017-03-31 DIAGNOSIS — E119 Type 2 diabetes mellitus without complications: Secondary | ICD-10-CM | POA: Diagnosis not present

## 2017-03-31 DIAGNOSIS — E559 Vitamin D deficiency, unspecified: Secondary | ICD-10-CM | POA: Diagnosis not present

## 2017-04-01 ENCOUNTER — Ambulatory Visit
Admission: RE | Admit: 2017-04-01 | Discharge: 2017-04-01 | Disposition: A | Discharge status: Home / Self Care | Payer: 59 | Source: Ambulatory Visit | Attending: Internal Medicine | Admitting: Internal Medicine

## 2017-04-01 ENCOUNTER — Other Ambulatory Visit: Payer: Self-pay | Admitting: Internal Medicine

## 2017-04-01 DIAGNOSIS — R928 Other abnormal and inconclusive findings on diagnostic imaging of breast: Secondary | ICD-10-CM | POA: Diagnosis not present

## 2017-04-01 DIAGNOSIS — N6489 Other specified disorders of breast: Secondary | ICD-10-CM

## 2017-04-05 ENCOUNTER — Ambulatory Visit (HOSPITAL_COMMUNITY)
Admission: RE | Admit: 2017-04-05 | Discharge: 2017-04-05 | Disposition: A | Discharge status: Home / Self Care | Payer: 59 | Source: Ambulatory Visit | Attending: Cardiovascular Disease | Admitting: Cardiovascular Disease

## 2017-04-05 DIAGNOSIS — E785 Hyperlipidemia, unspecified: Secondary | ICD-10-CM | POA: Diagnosis not present

## 2017-04-05 DIAGNOSIS — I6523 Occlusion and stenosis of bilateral carotid arteries: Secondary | ICD-10-CM | POA: Diagnosis not present

## 2017-04-05 DIAGNOSIS — G4733 Obstructive sleep apnea (adult) (pediatric): Secondary | ICD-10-CM | POA: Diagnosis not present

## 2017-04-05 DIAGNOSIS — I6529 Occlusion and stenosis of unspecified carotid artery: Secondary | ICD-10-CM | POA: Diagnosis present

## 2017-04-05 DIAGNOSIS — E119 Type 2 diabetes mellitus without complications: Secondary | ICD-10-CM | POA: Diagnosis not present

## 2017-04-05 DIAGNOSIS — I10 Essential (primary) hypertension: Secondary | ICD-10-CM | POA: Diagnosis not present

## 2017-04-06 DIAGNOSIS — E1165 Type 2 diabetes mellitus with hyperglycemia: Secondary | ICD-10-CM | POA: Diagnosis not present

## 2017-04-06 DIAGNOSIS — E039 Hypothyroidism, unspecified: Secondary | ICD-10-CM | POA: Diagnosis not present

## 2017-04-06 DIAGNOSIS — I10 Essential (primary) hypertension: Secondary | ICD-10-CM | POA: Diagnosis not present

## 2017-04-07 ENCOUNTER — Ambulatory Visit
Admission: RE | Admit: 2017-04-07 | Discharge: 2017-04-07 | Disposition: A | Discharge status: Home / Self Care | Payer: 59 | Source: Ambulatory Visit | Attending: Internal Medicine | Admitting: Internal Medicine

## 2017-04-07 ENCOUNTER — Other Ambulatory Visit: Payer: Self-pay | Admitting: Internal Medicine

## 2017-04-07 DIAGNOSIS — N6489 Other specified disorders of breast: Secondary | ICD-10-CM

## 2017-04-13 ENCOUNTER — Encounter (HOSPITAL_COMMUNITY): Payer: Self-pay | Admitting: Emergency Medicine

## 2017-04-13 ENCOUNTER — Observation Stay (HOSPITAL_COMMUNITY)
Admission: EM | Admit: 2017-04-13 | Discharge: 2017-04-15 | Disposition: A | Discharge status: Home / Self Care | Payer: 59 | Attending: Internal Medicine | Admitting: Internal Medicine

## 2017-04-13 ENCOUNTER — Emergency Department (HOSPITAL_COMMUNITY): Payer: 59

## 2017-04-13 DIAGNOSIS — N179 Acute kidney failure, unspecified: Secondary | ICD-10-CM | POA: Diagnosis present

## 2017-04-13 DIAGNOSIS — E039 Hypothyroidism, unspecified: Secondary | ICD-10-CM | POA: Diagnosis not present

## 2017-04-13 DIAGNOSIS — E785 Hyperlipidemia, unspecified: Secondary | ICD-10-CM | POA: Insufficient documentation

## 2017-04-13 DIAGNOSIS — Z8614 Personal history of Methicillin resistant Staphylococcus aureus infection: Secondary | ICD-10-CM | POA: Insufficient documentation

## 2017-04-13 DIAGNOSIS — Z803 Family history of malignant neoplasm of breast: Secondary | ICD-10-CM | POA: Insufficient documentation

## 2017-04-13 DIAGNOSIS — Z79899 Other long term (current) drug therapy: Secondary | ICD-10-CM | POA: Diagnosis not present

## 2017-04-13 DIAGNOSIS — F419 Anxiety disorder, unspecified: Secondary | ICD-10-CM | POA: Diagnosis not present

## 2017-04-13 DIAGNOSIS — K219 Gastro-esophageal reflux disease without esophagitis: Secondary | ICD-10-CM | POA: Insufficient documentation

## 2017-04-13 DIAGNOSIS — F329 Major depressive disorder, single episode, unspecified: Secondary | ICD-10-CM | POA: Insufficient documentation

## 2017-04-13 DIAGNOSIS — Z9989 Dependence on other enabling machines and devices: Secondary | ICD-10-CM | POA: Insufficient documentation

## 2017-04-13 DIAGNOSIS — R0609 Other forms of dyspnea: Secondary | ICD-10-CM | POA: Insufficient documentation

## 2017-04-13 DIAGNOSIS — Z7982 Long term (current) use of aspirin: Secondary | ICD-10-CM | POA: Insufficient documentation

## 2017-04-13 DIAGNOSIS — I340 Nonrheumatic mitral (valve) insufficiency: Secondary | ICD-10-CM | POA: Insufficient documentation

## 2017-04-13 DIAGNOSIS — R0789 Other chest pain: Principal | ICD-10-CM | POA: Insufficient documentation

## 2017-04-13 DIAGNOSIS — R079 Chest pain, unspecified: Secondary | ICD-10-CM | POA: Diagnosis present

## 2017-04-13 DIAGNOSIS — G4733 Obstructive sleep apnea (adult) (pediatric): Secondary | ICD-10-CM | POA: Diagnosis not present

## 2017-04-13 DIAGNOSIS — E1151 Type 2 diabetes mellitus with diabetic peripheral angiopathy without gangrene: Secondary | ICD-10-CM | POA: Diagnosis not present

## 2017-04-13 DIAGNOSIS — E871 Hypo-osmolality and hyponatremia: Secondary | ICD-10-CM | POA: Diagnosis present

## 2017-04-13 DIAGNOSIS — E559 Vitamin D deficiency, unspecified: Secondary | ICD-10-CM | POA: Insufficient documentation

## 2017-04-13 DIAGNOSIS — E119 Type 2 diabetes mellitus without complications: Secondary | ICD-10-CM

## 2017-04-13 DIAGNOSIS — Z90710 Acquired absence of both cervix and uterus: Secondary | ICD-10-CM | POA: Insufficient documentation

## 2017-04-13 DIAGNOSIS — Z794 Long term (current) use of insulin: Secondary | ICD-10-CM | POA: Insufficient documentation

## 2017-04-13 DIAGNOSIS — R600 Localized edema: Secondary | ICD-10-CM | POA: Diagnosis not present

## 2017-04-13 DIAGNOSIS — I10 Essential (primary) hypertension: Secondary | ICD-10-CM | POA: Diagnosis present

## 2017-04-13 DIAGNOSIS — Z6841 Body Mass Index (BMI) 40.0 and over, adult: Secondary | ICD-10-CM | POA: Insufficient documentation

## 2017-04-13 DIAGNOSIS — Z809 Family history of malignant neoplasm, unspecified: Secondary | ICD-10-CM | POA: Diagnosis not present

## 2017-04-13 DIAGNOSIS — Z8249 Family history of ischemic heart disease and other diseases of the circulatory system: Secondary | ICD-10-CM | POA: Insufficient documentation

## 2017-04-13 DIAGNOSIS — IMO0001 Reserved for inherently not codable concepts without codable children: Secondary | ICD-10-CM

## 2017-04-13 DIAGNOSIS — I2721 Secondary pulmonary arterial hypertension: Secondary | ICD-10-CM | POA: Diagnosis not present

## 2017-04-13 DIAGNOSIS — R0600 Dyspnea, unspecified: Secondary | ICD-10-CM

## 2017-04-13 DIAGNOSIS — R0602 Shortness of breath: Secondary | ICD-10-CM | POA: Diagnosis not present

## 2017-04-13 DIAGNOSIS — R918 Other nonspecific abnormal finding of lung field: Secondary | ICD-10-CM | POA: Diagnosis not present

## 2017-04-13 DIAGNOSIS — Z833 Family history of diabetes mellitus: Secondary | ICD-10-CM | POA: Insufficient documentation

## 2017-04-13 LAB — CBC
HEMATOCRIT: 41.1 % (ref 36.0–46.0)
Hemoglobin: 13.3 g/dL (ref 12.0–15.0)
MCH: 28.1 pg (ref 26.0–34.0)
MCHC: 32.4 g/dL (ref 30.0–36.0)
MCV: 86.7 fL (ref 78.0–100.0)
PLATELETS: 308 10*3/uL (ref 150–400)
RBC: 4.74 MIL/uL (ref 3.87–5.11)
RDW: 15.5 % (ref 11.5–15.5)
WBC: 11.8 10*3/uL — AB (ref 4.0–10.5)

## 2017-04-13 LAB — BASIC METABOLIC PANEL
Anion gap: 11 (ref 5–15)
BUN: 36 mg/dL — AB (ref 6–20)
CO2: 28 mmol/L (ref 22–32)
CREATININE: 1.73 mg/dL — AB (ref 0.44–1.00)
Calcium: 9.2 mg/dL (ref 8.9–10.3)
Chloride: 94 mmol/L — ABNORMAL LOW (ref 101–111)
GFR calc Af Amer: 34 mL/min — ABNORMAL LOW (ref >60)
GFR calc non Af Amer: 30 mL/min — ABNORMAL LOW (ref >60)
Glucose, Bld: 193 mg/dL — ABNORMAL HIGH (ref 65–99)
POTASSIUM: 4.3 mmol/L (ref 3.5–5.1)
SODIUM: 133 mmol/L — AB (ref 135–145)

## 2017-04-13 LAB — I-STAT TROPONIN, ED: Troponin i, poc: 0.02 ng/mL (ref 0.00–0.08)

## 2017-04-13 NOTE — ED Triage Notes (Signed)
Pt had Cp a couple months ago that was off and on, in the last three weeks is gotten worse, pt states L sided chest pain that goes around to the back. Pt also states her BP has been jumping around ever since they changed her meds a few weeks ago.

## 2017-04-13 NOTE — ED Provider Notes (Signed)
[facility] DEPT Provider Note   CSN: [id number] Arrival date & time: 04/13/17  1843   By signing my name below, I, Eunice Blase, attest that this documentation has been prepared under the direction and in the presence of Shefali Ng, Gwenyth Allegra, MD. Electronically signed, Eunice Blase, ED Scribe. 04/13/17. 11:55 PM.   History   Chief Complaint Chief Complaint  Patient presents with  . Chest Pain  . Hypertension   The history is provided by the patient and medical records. No language interpreter was used.    Sue Davidson is a 66 y.o. female with h/o multiple heart disorders, HTN and DM presenting to the Emergency Department with chief complaint of recurrent, intermittent, sporadic, gradually worsening chest pains x ~2 weeks. Progressive SOB on exertion, intermittent lightheadedness, nausea, back pain and leg swelling noted. Pt c/o 6/10, aching, L mid to upper back pain currently. No modifying factors noted. She states she has had chronic low back pain in the past and her current pain is different. Dr. Rex Kras performed a heart catheter on her at some point between 2005-2008. She is currently followed for cardiology by another physician. She uses CPAP nightly for sleep apnea and support hosiery for significant leg swelling. PCP noted for F/U. No other complaints at this time.   Past Medical History:  Diagnosis Date  . Anemia    hx of  . Anginal pain (HCC)    hx of  . Anxiety   . Depression   . Diabetes mellitus without complication ([employer])   . GERD (gastroesophageal reflux disease)   . Headache(784.0)   . Heart murmur   . Hyperlipidemia   . Hypertension   . MRSA (methicillin resistant staph aureus) culture positive    many years ago-abdominal wound- no issuses since.  Marland Kitchen PONV (postoperative nausea and vomiting)    severe  . Sleep apnea    Study done -remains under evaluation- no cpap yet.  . Vitamin D deficiency     Patient Active Problem List   Diagnosis Date Noted  .  Carotid disease, bilateral ([place]) 10/06/2016  . Normal coronary arteries 10/06/2016  . Lateral meniscal tear 06/29/2016  . Pulmonary artery hypertension ([place]) 04/23/2016  . Exertional dyspnea 01/26/2016  . Bilateral leg edema 01/26/2016  . Morbid obesity ([place]) 01/26/2016  . Dyslipidemia 01/26/2016  . Chronic cholecystitis 10/25/2013  . DYSPNEA 06/05/2009  . Insulin dependent diabetes mellitus ([place]) 06/04/2009  . OSA (obstructive sleep apnea) 06/04/2009  . Essential hypertension 06/04/2009  . EDEMA LEG 06/04/2009    Past Surgical History:  Procedure Laterality Date  . ABDOMINAL HYSTERECTOMY  1970's  . APPENDECTOMY  1970's  . BREAST BIOPSY  02/09/1996  . BREAST EXCISIONAL BIOPSY Left 2000   no visible scar  . CARDIAC CATHETERIZATION    . CHOLECYSTECTOMY N/A 10/02/2013   Procedure: LAPAROSCOPIC CHOLECYSTECTOMY;  Surgeon: Earnstine Regal, MD;  Location: [facility];  Service: General;  Laterality: N/A;  . cyst removed Left    wrist"ganglion"  . ELBOW SURGERY Left   . HARDWARE REMOVAL Left    ankle  . KNEE ARTHROSCOPY Right 06/30/2016   Procedure: ARTHROSCOPY RIGHT KNEE WITH MEDIAL AND LATERAL MENSICAL DEBRIDEMENT;  Surgeon: Gaynelle Arabian, MD;  Location: [facility];  Service: Orthopedics;  Laterality: Right;  LMA  . LEG SURGERY Left 1980's   broke leg and ankle  . SHOULDER ARTHROSCOPY Bilateral   . TONSILLECTOMY  1970's  . TUBAL LIGATION  1970's    OB History    No data available  Home Medications    Prior to Admission medications   Medication Sig Start Date End Date Taking? Authorizing Provider  aspirin 81 MG tablet Take 81 mg by mouth daily.   Yes [provider]  cholecalciferol (VITAMIN D) 1000 units tablet Take 1,000 Units by mouth daily.   Yes [provider]  furosemide (LASIX) 40 MG tablet Take 40-80 mg by mouth daily. Alternates taking 40mg  then 80mg . 08/11/13  Yes [provider]  HUMALOG MIX 75/25 (75-25) 100 UNIT/ML SUSP injection Inject  into the skin 3 (three) times daily with meals. 70 units every morning. 15-25 units at lunch on a sliding scale depending on blood sugar. 85 units with supper. 08/15/13  Yes [provider]  HYDROcodone-acetaminophen (NORCO) 5-325 MG tablet Take 1-2 tablets by mouth every 4 (four) hours as needed for moderate pain. 06/30/16  Yes Aluisio, Pilar Plate, MD  ibuprofen (ADVIL,MOTRIN) 200 MG tablet Take 400 mg by mouth every 4 (four) hours as needed for moderate pain.    Yes [provider]  levothyroxine (SYNTHROID, LEVOTHROID) 75 MCG tablet Take 75 mcg by mouth daily before breakfast.   Yes [provider]  methocarbamol (ROBAXIN) 500 MG tablet Take 1 tablet (500 mg total) by mouth 4 (four) times daily. As needed for muscle spasm 06/30/16  Yes Aluisio, Pilar Plate, MD  olmesartan-hydrochlorothiazide (BENICAR HCT) 40-25 MG tablet Take 1 tablet by mouth daily.   Yes [provider]  omeprazole (PRILOSEC) 40 MG capsule Take 40 mg by mouth daily. Reported on 01/19/2016 07/18/13  Yes [provider]  ondansetron (ZOFRAN) 8 MG tablet Take 8 mg by mouth 2 (two) times daily as needed for nausea or vomiting.   Yes [provider]  rosuvastatin (CRESTOR) 20 MG tablet Take 20 mg by mouth daily with breakfast.    Yes [provider]    Family History Family History  Problem Relation Age of Onset  . Diabetes Mother   . Heart disease Mother   . Cancer Father        throat and lung  . Cancer Paternal Aunt        breast  . Cancer Paternal Aunt        colon  . Breast cancer Maternal Aunt     Social History Social History  Substance Use Topics  . Smoking status: Never Smoker  . Smokeless tobacco: Never Used  . Alcohol use No     Allergies   Tape   Review of Systems Review of Systems  Respiratory: Positive for shortness of breath.   Cardiovascular: Positive for chest pain and leg swelling.  Gastrointestinal: Positive for nausea. Negative for  vomiting.  Musculoskeletal: Positive for back pain.  Neurological: Positive for light-headedness.  All other systems reviewed and are negative.    Physical Exam Updated Vital Signs BP (!) 117/56 (BP Location: Left Arm)   Pulse 80   Temp 98.2 F (36.8 C) (Oral)   Resp 16   SpO2 100%   Physical Exam  Constitutional: She is oriented to person, place, and time. She appears well-developed and well-nourished. No distress.  HENT:  Head: Normocephalic and atraumatic.  Right Ear: Hearing normal.  Left Ear: Hearing normal.  Nose: Nose normal.  Mouth/Throat: Oropharynx is clear and moist and mucous membranes are normal.  Eyes: Conjunctivae and EOM are normal. Pupils are equal, round, and reactive to light.  Neck: Normal range of motion. Neck supple.  Cardiovascular: Regular rhythm, S1 normal and S2 normal.  Exam reveals  no gallop and no friction rub.   No murmur heard. 2/6 systolic murmur  Pulmonary/Chest: Effort normal and breath sounds normal. No respiratory distress. She exhibits no tenderness.  Abdominal: Soft. Normal appearance and bowel sounds are normal. There is no hepatosplenomegaly. There is no tenderness. There is no rebound, no guarding, no tenderness at McBurney's point and negative Murphy's sign. No hernia.  Musculoskeletal: Normal range of motion. She exhibits edema (1+, pitting, bilaterally).  Neurological: She is alert and oriented to person, place, and time. She has normal strength. No cranial nerve deficit or sensory deficit. Coordination normal. GCS eye subscore is 4. GCS verbal subscore is 5. GCS motor subscore is 6.  Skin: Skin is warm, dry and intact. No rash noted. No cyanosis.  Psychiatric: She has a normal mood and affect. Her speech is normal and behavior is normal. Thought content normal.  Nursing note and vitals reviewed.    ED Treatments / Results  DIAGNOSTIC STUDIES: Oxygen Saturation is 100% on RA, NL by my interpretation.    COORDINATION OF  CARE: 11:48 PM-Discussed next steps with pt. Pt verbalized understanding and is agreeable with the plan. Will prepare for admission.   Labs (all labs ordered are listed, but only abnormal results are displayed) Labs Reviewed  BASIC METABOLIC PANEL - Abnormal; Notable for the following:       Result Value   Sodium 133 (*)    Chloride 94 (*)    Glucose, Bld 193 (*)    BUN 36 (*)    Creatinine, Ser 1.73 (*)    GFR calc non Af Amer 30 (*)    GFR calc Af Amer 34 (*)    All other components within normal limits  CBC - Abnormal; Notable for the following:    WBC 11.8 (*)    All other components within normal limits  BRAIN NATRIURETIC PEPTIDE  I-STAT TROPOININ, ED    EKG  EKG Interpretation None       Radiology Dg Chest 2 View  Result Date: 04/13/2017 CLINICAL DATA:  Pain across the lower chest and diaphragm for 1 month EXAM: CHEST  2 VIEW COMPARISON:  01/12/2017 FINDINGS: The heart size and mediastinal contours are within normal limits. Both lungs are clear. The visualized skeletal structures are unremarkable. Surgical clips in the right upper quadrant IMPRESSION: No active cardiopulmonary disease. Electronically Signed   By: Donavan Foil M.D.   On: 04/13/2017 20:01    Procedures Procedures (including critical care time)  Medications Ordered in ED Medications - No data to display   Initial Impression / Assessment and Plan / ED Course  I have reviewed the triage vital signs and the nursing notes.  Pertinent labs & imaging results that were available during my care of the patient were reviewed by me and considered in my medical decision making (see chart for details).     Patient presents with complaints of chest pain. She has been having intermittent chest pain for several weeks. She also has noticed dyspnea on exertion over this period of time. Additionally she has been experiencing swelling of her legs. She has a history of obstructive sleep apnea, uses CPAP at night. Echo  in 2017 revealed normal left ventricular function with grade 1 diastolic dysfunction, ejection fraction 60-65%. Patient has an audible systolic murmur, echo did show thickened aortic valve, trivial tricuspid regurg, trivial pulmonic regurgitation at that time.  Patient reports previous heart catheterization in either 2005 or 2008 that did not show any disease. She has  many cardiac risk factors, however. She has hypertension, high cholesterol, diabetes, obesity and also has atherosclerotic carotid disease. Patient has a heart score of at least 4, possibly 5 if dyspnea on exertion is felt to be moderately suspicious. Will recommend hospitalization for further cardiac evaluation.  Final Clinical Impressions(s) / ED Diagnoses   Final diagnoses:  Chest pain, unspecified type  DOE (dyspnea on exertion)    New Prescriptions New Prescriptions   No medications on file  I personally performed the services described in this documentation, which was scribed in my presence. The recorded information has been reviewed and is accurate.    Orpah Greek, MD 04/14/17 [phone]

## 2017-04-13 NOTE — ED Notes (Signed)
Pt did not answer when called for rooming

## 2017-04-14 ENCOUNTER — Encounter (HOSPITAL_COMMUNITY): Payer: Self-pay | Admitting: Family Medicine

## 2017-04-14 ENCOUNTER — Observation Stay (HOSPITAL_BASED_OUTPATIENT_CLINIC_OR_DEPARTMENT_OTHER): Payer: 59

## 2017-04-14 ENCOUNTER — Observation Stay (HOSPITAL_COMMUNITY): Payer: 59

## 2017-04-14 ENCOUNTER — Other Ambulatory Visit: Payer: Self-pay | Admitting: Physician Assistant

## 2017-04-14 DIAGNOSIS — R0789 Other chest pain: Secondary | ICD-10-CM

## 2017-04-14 DIAGNOSIS — Z794 Long term (current) use of insulin: Secondary | ICD-10-CM | POA: Diagnosis not present

## 2017-04-14 DIAGNOSIS — I340 Nonrheumatic mitral (valve) insufficiency: Secondary | ICD-10-CM | POA: Diagnosis not present

## 2017-04-14 DIAGNOSIS — R079 Chest pain, unspecified: Secondary | ICD-10-CM | POA: Diagnosis not present

## 2017-04-14 DIAGNOSIS — N179 Acute kidney failure, unspecified: Secondary | ICD-10-CM | POA: Diagnosis not present

## 2017-04-14 DIAGNOSIS — I200 Unstable angina: Secondary | ICD-10-CM | POA: Diagnosis not present

## 2017-04-14 DIAGNOSIS — I10 Essential (primary) hypertension: Secondary | ICD-10-CM

## 2017-04-14 DIAGNOSIS — E871 Hypo-osmolality and hyponatremia: Secondary | ICD-10-CM | POA: Diagnosis not present

## 2017-04-14 DIAGNOSIS — G4733 Obstructive sleep apnea (adult) (pediatric): Secondary | ICD-10-CM | POA: Diagnosis not present

## 2017-04-14 DIAGNOSIS — E119 Type 2 diabetes mellitus without complications: Secondary | ICD-10-CM | POA: Diagnosis not present

## 2017-04-14 DIAGNOSIS — R600 Localized edema: Secondary | ICD-10-CM | POA: Diagnosis not present

## 2017-04-14 DIAGNOSIS — E1151 Type 2 diabetes mellitus with diabetic peripheral angiopathy without gangrene: Secondary | ICD-10-CM | POA: Diagnosis not present

## 2017-04-14 LAB — BASIC METABOLIC PANEL
ANION GAP: 11 (ref 5–15)
BUN: 42 mg/dL — ABNORMAL HIGH (ref 6–20)
CO2: 29 mmol/L (ref 22–32)
Calcium: 8.7 mg/dL — ABNORMAL LOW (ref 8.9–10.3)
Chloride: 96 mmol/L — ABNORMAL LOW (ref 101–111)
Creatinine, Ser: 1.83 mg/dL — ABNORMAL HIGH (ref 0.44–1.00)
GFR calc Af Amer: 32 mL/min — ABNORMAL LOW (ref >60)
GFR, EST NON AFRICAN AMERICAN: 28 mL/min — AB (ref >60)
GLUCOSE: 187 mg/dL — AB (ref 65–99)
POTASSIUM: 4.3 mmol/L (ref 3.5–5.1)
SODIUM: 136 mmol/L (ref 135–145)

## 2017-04-14 LAB — OSMOLALITY, URINE: OSMOLALITY UR: 604 mosm/kg (ref 300–900)

## 2017-04-14 LAB — URINALYSIS, ROUTINE W REFLEX MICROSCOPIC
Bilirubin Urine: NEGATIVE
GLUCOSE, UA: NEGATIVE mg/dL
Hgb urine dipstick: NEGATIVE
Ketones, ur: NEGATIVE mg/dL
LEUKOCYTES UA: NEGATIVE
Nitrite: NEGATIVE
PROTEIN: NEGATIVE mg/dL
SPECIFIC GRAVITY, URINE: 1.019 (ref 1.005–1.030)
pH: 5 (ref 5.0–8.0)

## 2017-04-14 LAB — BRAIN NATRIURETIC PEPTIDE: B Natriuretic Peptide: 36.2 pg/mL (ref 0.0–100.0)

## 2017-04-14 LAB — GLUCOSE, CAPILLARY
GLUCOSE-CAPILLARY: 200 mg/dL — AB (ref 65–99)
GLUCOSE-CAPILLARY: 225 mg/dL — AB (ref 65–99)
GLUCOSE-CAPILLARY: 253 mg/dL — AB (ref 65–99)
Glucose-Capillary: 191 mg/dL — ABNORMAL HIGH (ref 65–99)
Glucose-Capillary: 202 mg/dL — ABNORMAL HIGH (ref 65–99)
Glucose-Capillary: 188 mg/dL — ABNORMAL HIGH (ref 65–99)
Glucose-Capillary: 281 mg/dL — ABNORMAL HIGH (ref 65–99)

## 2017-04-14 LAB — ECHOCARDIOGRAM COMPLETE
Height: 66 in
Weight: 4270.4 oz

## 2017-04-14 LAB — MRSA PCR SCREENING: MRSA BY PCR: NEGATIVE

## 2017-04-14 LAB — OSMOLALITY: Osmolality: 302 mOsm/kg — ABNORMAL HIGH (ref 275–295)

## 2017-04-14 LAB — TROPONIN I
TROPONIN I: 0.05 ng/mL — AB (ref <0.03)
Troponin I: <0.03 ng/mL (ref <0.03)
Troponin I: <0.03 ng/mL (ref <0.03)

## 2017-04-14 LAB — SODIUM, URINE, RANDOM: Sodium, Ur: 81 mmol/L

## 2017-04-14 MED ORDER — SODIUM CHLORIDE 0.9 % IV BOLUS (SEPSIS)
500.0000 mL | Freq: Once | INTRAVENOUS | Status: AC
  Administered 2017-04-14: 500 mL via INTRAVENOUS

## 2017-04-14 MED ORDER — ENOXAPARIN SODIUM 30 MG/0.3ML ~~LOC~~ SOLN
30.0000 mg | SUBCUTANEOUS | Status: DC
  Administered 2017-04-14 – 2017-04-15 (×2): 30 mg via SUBCUTANEOUS
  Filled 2017-04-14 (×2): qty 0.3

## 2017-04-14 MED ORDER — METHOCARBAMOL 500 MG PO TABS: 500.0000 mg | ORAL_TABLET | Freq: Four times a day (QID) | ORAL | Status: DC | PRN

## 2017-04-14 MED ORDER — GI COCKTAIL ~~LOC~~: 30.0000 mL | Freq: Four times a day (QID) | ORAL | Status: DC | PRN

## 2017-04-14 MED ORDER — ASPIRIN 81 MG PO CHEW
81.0000 mg | CHEWABLE_TABLET | Freq: Every day | ORAL | Status: DC
  Administered 2017-04-14 – 2017-04-15 (×2): 81 mg via ORAL
  Filled 2017-04-14 (×2): qty 1

## 2017-04-14 MED ORDER — METHOCARBAMOL 500 MG PO TABS
500.0000 mg | ORAL_TABLET | Freq: Four times a day (QID) | ORAL | Status: DC
  Filled 2017-04-14: qty 1

## 2017-04-14 MED ORDER — LEVOTHYROXINE SODIUM 75 MCG PO TABS
75.0000 ug | ORAL_TABLET | Freq: Every day | ORAL | Status: DC
  Administered 2017-04-14 – 2017-04-15 (×2): 75 ug via ORAL
  Filled 2017-04-14 (×2): qty 1

## 2017-04-14 MED ORDER — ROSUVASTATIN CALCIUM 10 MG PO TABS
20.0000 mg | ORAL_TABLET | Freq: Every day | ORAL | Status: DC
  Administered 2017-04-14 – 2017-04-15 (×2): 20 mg via ORAL
  Filled 2017-04-14 (×2): qty 2

## 2017-04-14 MED ORDER — INSULIN ASPART 100 UNIT/ML ~~LOC~~ SOLN
0.0000 [IU] | SUBCUTANEOUS | Status: DC
  Administered 2017-04-14 (×2): 11 [IU] via SUBCUTANEOUS
  Administered 2017-04-14 (×2): 7 [IU] via SUBCUTANEOUS
  Administered 2017-04-14: 4 [IU] via SUBCUTANEOUS
  Administered 2017-04-15 (×2): 7 [IU] via SUBCUTANEOUS
  Administered 2017-04-15: 11 [IU] via SUBCUTANEOUS

## 2017-04-14 MED ORDER — PERFLUTREN LIPID MICROSPHERE
1.0000 mL | INTRAVENOUS | Status: AC | PRN
  Filled 2017-04-14: qty 10

## 2017-04-14 MED ORDER — PANTOPRAZOLE SODIUM 40 MG PO TBEC
40.0000 mg | DELAYED_RELEASE_TABLET | Freq: Every day | ORAL | Status: DC
  Administered 2017-04-14 – 2017-04-15 (×2): 40 mg via ORAL
  Filled 2017-04-14 (×2): qty 1

## 2017-04-14 MED ORDER — ONDANSETRON HCL 4 MG/2ML IJ SOLN: 4.0000 mg | Freq: Four times a day (QID) | INTRAMUSCULAR | Status: DC | PRN

## 2017-04-14 MED ORDER — ACETAMINOPHEN 325 MG PO TABS: 650.0000 mg | ORAL_TABLET | ORAL | Status: DC | PRN

## 2017-04-14 MED ORDER — ASPIRIN 81 MG PO CHEW
324.0000 mg | CHEWABLE_TABLET | Freq: Once | ORAL | Status: AC
  Administered 2017-04-14: 324 mg via ORAL
  Filled 2017-04-14: qty 4

## 2017-04-14 NOTE — Progress Notes (Signed)
Echo showed normal LV function to 55-60%, no WM abnormality, grade 1 DD, mild MR and mild dilated LA. No inpatient work up needed. Will schedule outpatient 2 day stress test. Will sign off. Call with questions. AKI treatment per primary.

## 2017-04-14 NOTE — Consult Note (Signed)
Cardiology Consultation:   Patient ID: Sue Davidson; [id number]; Aug 01, 1951   Admit date: 04/13/2017 Date of Consult: 04/14/2017  Primary Care Provider: Deland Pretty, MD Primary Cardiologist: Dr. Sallyanne Kuster OSA: Dr.Kelly   Patient Profile:   Sue Davidson is a 66 y.o. female with a hx of OSA on CPAP, HTN, DM, HLD, mild pHTN, carotid artery disease, morbid obesity, chronic bilateral lower extremity edema and exertional dyspnea who is being seen today for the evaluation of chest pain at the request of Dr. Allyson Sabal.   She underwent cardiac catheterization most recently in October 2012 which showed no evidence of coronary obstructive disease. Left ventricular end-diastolic pressure was 10 mmHg. 2 years earlier in 2010 she had right and left heart catheterization that showed a pulmonary artery wedge pressure of 6 and a PA pressure of 22/9. Prior to that she had normal coronary angiograms performed in 1997 and in 2003.   Echocardiography April 2017 showed normal left ventricular systolic function and minimum signs of diastolic dysfunction with normal filling pressures, but did show mild pulmonary hypertension with PA pressure 38.   Last carotid doppler 04/05/17 showed mild plaque bilaterally. Mother had a CABG in her 61s. Father had a CABG at age 67. Brother had a CABG at age 10.  History of Present Illness:   Sue Davidson recently dealing with lower extremity edema and worsening shortness of breath. Dr. Sallyanne Kuster advised to elevate her leg and compliant with CPAP. No improvement. Recently started on Benicar/HCTZ  by Dr. Chalmers Cater for elevated blood pressure. She has mostly sedentary lifestyle.   Patient had a intermittent episode of substernal chest discomfort yesterday. She described as achy/pressure. Radiating to her back. It occurs with and without exertion. Stable dyspnea. She denies nausea, vomiting, diaphoresis. She sleeps on 2 pillows chronically. Compliant with low sodium diet and CPAP. She is chest  pain-free since admission.   EKG shows normal sinus rhythm with chronic LAFB, no acute changes. Troponin 0.05-->0.03-->0.03. Scr 1.73-->1.83 even with IV hydration and holding diuretics and ARB. Chest x-ray without acute cardiopulmonary disease.  Past Medical History:  Diagnosis Date  . Anemia    hx of  . Anginal pain (HCC)    hx of  . Anxiety   . Depression   . Diabetes mellitus without complication ([place])   . GERD (gastroesophageal reflux disease)   . Headache(784.0)   . Heart murmur   . Hyperlipidemia   . Hypertension   . MRSA (methicillin resistant staph aureus) culture positive    many years ago-abdominal wound- no issuses since.  Marland Kitchen PONV (postoperative nausea and vomiting)    severe  . Sleep apnea    Study done -remains under evaluation- no cpap yet.  . Vitamin D deficiency     Past Surgical History:  Procedure Laterality Date  . ABDOMINAL HYSTERECTOMY  1970's  . APPENDECTOMY  1970's  . BREAST BIOPSY  02/09/1996  . BREAST EXCISIONAL BIOPSY Left 2000   no visible scar  . CARDIAC CATHETERIZATION    . CHOLECYSTECTOMY N/A 10/02/2013   Procedure: LAPAROSCOPIC CHOLECYSTECTOMY;  Surgeon: Earnstine Regal, MD;  Location: [facility];  Service: General;  Laterality: N/A;  . cyst removed Left    wrist"ganglion"  . ELBOW SURGERY Left   . HARDWARE REMOVAL Left    ankle  . KNEE ARTHROSCOPY Right 06/30/2016   Procedure: ARTHROSCOPY RIGHT KNEE WITH MEDIAL AND LATERAL MENSICAL DEBRIDEMENT;  Surgeon: Gaynelle Arabian, MD;  Location: [facility];  Service: Orthopedics;  Laterality: Right;  LMA  .  LEG SURGERY Left 1980's   broke leg and ankle  . SHOULDER ARTHROSCOPY Bilateral   . TONSILLECTOMY  1970's  . TUBAL LIGATION  1970's     Inpatient Medications: Scheduled Meds: . aspirin  81 mg Oral Daily  . enoxaparin (LOVENOX) injection  30 mg Subcutaneous Q24H  . insulin aspart  0-20 Units Subcutaneous Q4H  . levothyroxine  75 mcg Oral QAC breakfast  . methocarbamol  500 mg Oral QID  .  pantoprazole  40 mg Oral Daily  . rosuvastatin  20 mg Oral Q breakfast   Continuous Infusions:  PRN Meds: acetaminophen, gi cocktail, ondansetron (ZOFRAN) IV  Allergies:    Allergies  Allergen Reactions  . Tape Rash    Social History:   Social History   Social History  . Marital status: Married    Spouse name: N/A  . Number of children: N/A  . Years of education: N/A   Occupational History  . Not on file.   Social History Main Topics  . Smoking status: Never Smoker  . Smokeless tobacco: Never Used  . Alcohol use No  . Drug use: No  . Sexual activity: Yes    Birth control/ protection: Spermicide   Other Topics Concern  . Not on file   Social History Narrative   Epworth Sleepiness Scale Score:  15      --I have HTN   --I have had Insomnia   --I feel stressed and lack motivation   --I have Diabetes   --I am overweight or am gaining weight   --I awake feeling not rested       Family History:   The patient's family history includes Breast cancer in her maternal aunt; Cancer in her father, paternal aunt, and paternal aunt; Diabetes in her mother; Heart disease in her father and mother.  ROS:  Please see the history of present illness.  ROS All other ROS reviewed and negative.     Physical Exam/Data:   Vitals:   04/14/17 0130 04/14/17 0145 04/14/17 0223 04/14/17 0348  BP: (!) 116/58 138/64 (!) 149/70 (!) 100/45  Pulse: 79 76 75 66  Resp: (!) 22 16  17   Temp:   98.2 F (36.8 C) 97.8 F (36.6 C)  TempSrc:   Oral Oral  SpO2: 94% 97% 94% 95%  Weight:   266 lb 14.4 oz (121.1 kg)   Height:   5\' 6"  (1.676 m)    No intake or output data in the 24 hours ending 04/14/17 0847 Filed Weights   04/14/17 0223  Weight: 266 lb 14.4 oz (121.1 kg)   Body mass index is 43.08 kg/m.  General:  Well nourished, well developed, in no acute distress HEENT: normal Lymph: no adenopathy Neck: no JVD Endocrine:  No thryomegaly Vascular: No carotid bruits; FA pulses 2+  bilaterally without bruits  Cardiac:  normal S1, S2; RRR; II/VI systolic murmur  Lungs:  clear to auscultation bilaterally, no wheezing, rhonchi or rales  Abd: soft, nontender, no hepatomegaly  Ext: trace edema Musculoskeletal:  No deformities, BUE and BLE strength normal and equal Skin: warm and dry  Neuro:  CNs 2-12 intact, no focal abnormalities noted Psych:  Normal affect      Laboratory Data:  Chemistry Recent Labs Lab 04/13/17 1850 04/14/17 0711  NA 133* 136  K 4.3 4.3  CL 94* 96*  CO2 28 29  GLUCOSE 193* 187*  BUN 36* 42*  CREATININE 1.73* 1.83*  CALCIUM 9.2 8.7*  GFRNONAA 30* 28*  GFRAA 34* 32*  ANIONGAP 11 11    No results for input(s): PROT, ALBUMIN, AST, ALT, ALKPHOS, BILITOT in the last 168 hours. Hematology Recent Labs Lab 04/13/17 1850  WBC 11.8*  RBC 4.74  HGB 13.3  HCT 41.1  MCV 86.7  MCH 28.1  MCHC 32.4  RDW 15.5  PLT 308   Cardiac Enzymes Recent Labs Lab 04/14/17 0221 04/14/17 0440 04/14/17 0711  TROPONINI 0.05* <0.03 <0.03    Recent Labs Lab 04/13/17 1912  TROPIPOC 0.02    BNP Recent Labs Lab 04/13/17 1850  BNP 36.2    DDimer No results for input(s): DDIMER in the last 168 hours.  Radiology/Studies:  Dg Chest 2 View  Result Date: 04/13/2017 CLINICAL DATA:  Pain across the lower chest and diaphragm for 1 month EXAM: CHEST  2 VIEW COMPARISON:  01/12/2017 FINDINGS: The heart size and mediastinal contours are within normal limits. Both lungs are clear. The visualized skeletal structures are unremarkable. Surgical clips in the right upper quadrant IMPRESSION: No active cardiopulmonary disease. Electronically Signed   By: Donavan Foil M.D.   On: 04/13/2017 20:01    Echo 02/12/16 Study Conclusions  - Procedure narrative: Transthoracic echocardiography. Image   quality was suboptimal. The study was technically difficult.   Intravenous contrast (Definity) was administered. - Left ventricle: The cavity size was normal. Wall  thickness was   normal. Systolic function was normal. The estimated ejection   fraction was in the range of 60% to 65%. There was an increased   relative contribution of atrial contraction to ventricular   filling. Doppler parameters are consistent with abnormal left   ventricular relaxation (grade 1 diastolic dysfunction). - Aortic valve: Mildly thickened, mildly calcified leaflets. - Atrial septum: No defect or patent foramen ovale was identified. - Tricuspid valve: There was trivial regurgitation. - Pulmonic valve: There was trivial regurgitation. - Pulmonary arteries: PA peak pressure: 38 mm Hg (S). - Impressions: Normal LVF with grade 1 diastolic dysfunction. Mild   pulmonary HTN. Mild AV sclerosis.  Impressions:  - Normal LVF with grade 1 diastolic dysfunction. Mild pulmonary   HTN. Mild AV sclerosis. The right ventricular systolic pressure   was increased consistent with mild pulmonary hypertension.  Assessment and Plan:   1. Chest pain - Seems atypical. Troponin negative despite waxing and waning pain all day yesterday. EKG without acute ischemic changes. Her cardiac risk factor includes obesity, hypertension, hyperlipidemia, diabetes and family history of CAD. - Prior workup did not show any evidence of coronary artery disease. Likely outpatient to get a stress test.  2. Hypertension - Antihypertensive on hold due to a TIA. Blood pressure stable.  3. AKI - per primary  4. DM - Per primary  5. Systolic murmur - Last echocardiogram 02/2016 as above. Mild AV sclerosis.  Dr. Oval Linsey to see later today.  Jarrett Soho, PA  04/14/2017 8:47 AM

## 2017-04-14 NOTE — H&P (Signed)
History and Physical  Patient Name: Sue Davidson     LFY:[id number]    DOB: [birth year]    DOA: 04/13/2017 PCP: Deland Pretty, MD   Patient coming from: Home  Chief Complaint: Chest pain  HPI: Sue Davidson is a 66 y.o. female with a past medical history significant for IDDM, OSA on CPAP, HTN, PVD (carotids), hypothyroidism and morbid obesity who presents with 1 day chest pain.  The patient was in her usual state of health until last 3 weeks and she's noticed some progressive exertional dyspnea and mild leg swelling, for which she has been taking daily Lasix. Then this morning, she was sitting at her desk when she had onset of central chest discomfort, sharp in character, radiating to her back. This pain persisted, waxing and waning all day, present with exertion and without, possibly associated with diaphoresis and SOB.  No nausea, vomiting, worsening with food. No positional change. No progression of her orthopnea lately and no PND.   Finally tonight, she was concerned about her heart because her mother had open heart surgery in her 42's so she came to the ER because of the chest pain.  ED course: -Afebrile, heart rate 83, respirations and pulse ox normal, blood pressure 122/54 -Na 133, K 4.3, Cr 1.73 (baseline 0.96 three weeks ago), WBC 11.8K, Hgb 13.3 -BNP 36 -Troponin negative -CXR clear without effusion or airspace disease -ECG showed normal sinus rhythm -She was given aspirin 325 and TRH were asked to evaluate for chest pain   The patient does not take NSAIDs.  She takes furosemide daily, no recent dose changes.  She has taken an ARB for a while, recently added HCTZ to the comination.  She does take Prilosec.  No recent dehydration/vomtiing/diarrhea episodes.     ROS: Review of Systems  Respiratory: Positive for shortness of breath.   Cardiovascular: Positive for chest pain and leg swelling. Negative for orthopnea and PND.  All other systems reviewed and are negative.          Past Medical History:  Diagnosis Date  . Anemia    hx of  . Anginal pain (HCC)    hx of  . Anxiety   . Depression   . Diabetes mellitus without complication ([place])   . GERD (gastroesophageal reflux disease)   . Headache(784.0)   . Heart murmur   . Hyperlipidemia   . Hypertension   . MRSA (methicillin resistant staph aureus) culture positive    many years ago-abdominal wound- no issuses since.  Marland Kitchen PONV (postoperative nausea and vomiting)    severe  . Sleep apnea    Study done -remains under evaluation- no cpap yet.  . Vitamin D deficiency     Past Surgical History:  Procedure Laterality Date  . ABDOMINAL HYSTERECTOMY  1970's  . APPENDECTOMY  1970's  . BREAST BIOPSY  02/09/1996  . BREAST EXCISIONAL BIOPSY Left 2000   no visible scar  . CARDIAC CATHETERIZATION    . CHOLECYSTECTOMY N/A 10/02/2013   Procedure: LAPAROSCOPIC CHOLECYSTECTOMY;  Surgeon: Earnstine Regal, MD;  Location: [facility];  Service: General;  Laterality: N/A;  . cyst removed Left    wrist"ganglion"  . ELBOW SURGERY Left   . HARDWARE REMOVAL Left    ankle  . KNEE ARTHROSCOPY Right 06/30/2016   Procedure: ARTHROSCOPY RIGHT KNEE WITH MEDIAL AND LATERAL MENSICAL DEBRIDEMENT;  Surgeon: Gaynelle Arabian, MD;  Location: [facility];  Service: Orthopedics;  Laterality: Right;  LMA  . LEG SURGERY Left 1980's  broke leg and ankle  . SHOULDER ARTHROSCOPY Bilateral   . TONSILLECTOMY  1970's  . TUBAL LIGATION  1970's    Social History: Patient lives alone, husband died 2 months ago.  The patient walks unassisted.  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  Nonsmoker.  No alcohol.    Allergies  Allergen Reactions  . Tape Rash    Family history: family history includes Breast cancer in her maternal aunt; Cancer in her father, paternal aunt, and paternal aunt; Diabetes in her mother; Heart disease in her father and mother.  Prior to Admission medications   Medication Sig  Start Date End Date Taking? Authorizing Provider  aspirin 81 MG tablet Take 81 mg by mouth daily.   Yes [provider]  cholecalciferol (VITAMIN D) 1000 units tablet Take 1,000 Units by mouth daily.   Yes [provider]  furosemide (LASIX) 40 MG tablet Take 40-80 mg by mouth daily. Alternates taking 40mg  then 80mg . 08/11/13  Yes [provider]  HUMALOG MIX 75/25 (75-25) 100 UNIT/ML SUSP injection Inject into the skin 3 (three) times daily with meals. 70 units every morning. 15-25 units at lunch on a sliding scale depending on blood sugar. 85 units with supper. 08/15/13  Yes [provider]  HYDROcodone-acetaminophen (NORCO) 5-325 MG tablet Take 1-2 tablets by mouth every 4 (four) hours as needed for moderate pain. 06/30/16  Yes Aluisio, Pilar Plate, MD  ibuprofen (ADVIL,MOTRIN) 200 MG tablet Take 400 mg by mouth every 4 (four) hours as needed for moderate pain.    Yes [provider]  levothyroxine (SYNTHROID, LEVOTHROID) 75 MCG tablet Take 75 mcg by mouth daily before breakfast.   Yes [provider]  methocarbamol (ROBAXIN) 500 MG tablet Take 1 tablet (500 mg total) by mouth 4 (four) times daily. As needed for muscle spasm 06/30/16  Yes Aluisio, Pilar Plate, MD  olmesartan-hydrochlorothiazide (BENICAR HCT) 40-25 MG tablet Take 1 tablet by mouth daily.   Yes [provider]  omeprazole (PRILOSEC) 40 MG capsule Take 40 mg by mouth daily. Reported on 01/19/2016 07/18/13  Yes [provider]  ondansetron (ZOFRAN) 8 MG tablet Take 8 mg by mouth 2 (two) times daily as needed for nausea or vomiting.   Yes [provider]  rosuvastatin (CRESTOR) 20 MG tablet Take 20 mg by mouth daily with breakfast.    Yes [provider]       Physical Exam: BP (!) 149/70 (BP Location: Left Arm)   Pulse 75   Temp 98.2 F (36.8 C) (Oral)   Resp 16   Ht 5\' 6"  (1.676 m)   Wt 121.1 kg (266 lb 14.4 oz)   SpO2 94%   BMI 43.08 kg/m  General  appearance: Well-developed, obese adult female, alert and in no acute distress.   Eyes: Anicteric, conjunctiva pink, lids and lashes normal. PERRL.    ENT: No nasal deformity, discharge, epistaxis.  Hearing normal. OP moist without lesions.   Neck: No neck masses.  Trachea midline.  No thyromegaly/tenderness. Lymph: No cervical or supraclavicular lymphadenopathy. Skin: Warm and dry.  No jaundice.  No suspicious rashes or lesions. Cardiac: RRR, nl S1-S2, no murmurs appreciated.  Capillary refill is brisk.  JVP normal.  Minimal trace pretibial LE edema.  Radial and DP pulses 2+ and symmetric. Respiratory: Normal respiratory rate and rhythm.  CTAB without rales or wheezes. Abdomen: Abdomen soft.  No TTP. No ascites, distension, hepatosplenomegaly.   MSK: No  deformities or effusions.  No cyanosis or clubbing. Neuro: Cranial nerves 3-12 intact.  Sensation intact to light touch. Speech is fluent.  Muscle strength normal.    Psych: Sensorium intact and responding to questions, attention normal.  Behavior appropriate.  Affect normal.  Judgment and insight appear normal.     Labs on Admission:  I have personally reviewed following labs and imaging studies: CBC:  Recent Labs Lab 04/13/17 1850  WBC 11.8*  HGB 13.3  HCT 41.1  MCV 86.7  PLT 696   Basic Metabolic Panel:  Recent Labs Lab 04/13/17 1850  NA 133*  K 4.3  CL 94*  CO2 28  GLUCOSE 193*  BUN 36*  CREATININE 1.73*  CALCIUM 9.2   GFR: Estimated Creatinine Clearance: 42.4 mL/min (A) (by C-G formula based on SCr of 1.73 mg/dL (H)).  Liver Function Tests: No results for input(s): AST, ALT, ALKPHOS, BILITOT, PROT, ALBUMIN in the last 168 hours. No results for input(s): LIPASE, AMYLASE in the last 168 hours. No results for input(s): AMMONIA in the last 168 hours. Coagulation Profile: No results for input(s): INR, PROTIME in the last 168 hours. Cardiac Enzymes: No results for input(s): CKTOTAL, CKMB, CKMBINDEX, TROPONINI in the  last 168 hours. BNP (last 3 results) No results for input(s): PROBNP in the last 8760 hours. HbA1C: No results for input(s): HGBA1C in the last 72 hours. CBG:  Recent Labs Lab 04/14/17 0222  GLUCAP 191*   Lipid Profile: No results for input(s): CHOL, HDL, LDLCALC, TRIG, CHOLHDL, LDLDIRECT in the last 72 hours. Thyroid Function Tests: No results for input(s): TSH, T4TOTAL, FREET4, T3FREE, THYROIDAB in the last 72 hours. Anemia Panel: No results for input(s): VITAMINB12, FOLATE, FERRITIN, TIBC, IRON, RETICCTPCT in the last 72 hours. Sepsis Labs:  Invalid input(s): PROCALCITONIN, LACTICIDVEN No results found for this or any previous visit (from the past 240 hour(s)).       Radiological Exams on Admission: Personally reviewed CXR clear: Dg Chest 2 View  Result Date: 04/13/2017 CLINICAL DATA:  Pain across the lower chest and diaphragm for 1 month EXAM: CHEST  2 VIEW COMPARISON:  01/12/2017 FINDINGS: The heart size and mediastinal contours are within normal limits. Both lungs are clear. The visualized skeletal structures are unremarkable. Surgical clips in the right upper quadrant IMPRESSION: No active cardiopulmonary disease. Electronically Signed   By: Donavan Foil M.D.   On: 04/13/2017 20:01    EKG: Independently reviewed. Rate 71, QTc normal, NSR.  LAFB, and LVH, old. No ST changes or TW changes.        Assessment/Plan  1. Chest pain:  Atypical.  HEART score 4.   -Cycle enzymes overnight -Consult to Cardiology re: stress test   2. Acute kidney injury:  Unclear cause.  Most likely is pre-renal injury from furosemide use in setting of ARB.  No NSAIDs.  Less likely omeprazole AIN, too mild a degree of AKI. -Check UA -Fluid bolus challenge and repeat Scr tomorrow -Hold diuretic, ARB for now  3. Hypertension, peripheral vascular disease:  -Continue statin, aspirin -Hold ARB, HCTZ, furosemide  4. Type 2 diabetse:  -SSI while NPO -Hold home 75/25  5.  Hypothyroidism: -Continue levothyroxine  6. Other medications:  -Continue PPI as pantoprazole -Continue Robaxin           DVT prophylaxis: Lovenox  Code Status: FULL  Family Communication: None present  Disposition Plan: Anticipate IV fluids and repeat Cr.  Trend troponins ovenright Consults called: Cardiology via Inbasket Admission status: OBS At the  point of initial evaluation, it is my clinical opinion that admission for OBSERVATION is reasonable and necessary because the patient's presenting complaints in the context of their chronic conditions represent sufficient risk of deterioration or significant morbidity to constitute reasonable grounds for close observation in the hospital setting, but that the patient may be medically stable for discharge from the hospital within 24 to 48 hours.    Medical decision making: Patient seen at 1:50 AM on 04/14/2017.  The patient was discussed with Dr. Betsey Holiday.  What exists of the patient's chart was reviewed in depth and summarized above.  Clinical condition: stable.        Edwin Dada [facility] Pager [phone]

## 2017-04-14 NOTE — Progress Notes (Signed)
Patient arrived from [place]. Alert and oriented. Telemetry applied, CCMD called. Skin assessment done. Vitals stable. Denies any pain.

## 2017-04-14 NOTE — Progress Notes (Signed)
  Echocardiogram 2D Echocardiogram has been performed.  Sue Davidson 04/14/2017, 1:26 PM

## 2017-04-14 NOTE — Progress Notes (Signed)
Pt declines the use of cpap this evening.

## 2017-04-14 NOTE — Progress Notes (Signed)
Positive Troponin result and Osmolarity elevation. MD on call notified.

## 2017-04-14 NOTE — Progress Notes (Signed)
.  my

## 2017-04-14 NOTE — Progress Notes (Addendum)
Patient seen and examined   9F with morbid obesity, OSA on CPAP, hypertension, hyperlipidemia, diabetes and carotid artery disease here with chest pain.  Initial troponin was elevated to 0.05 but the two repeat levels were <0.03.  EKG was negative for ischemia.  Her last cath in 2012 showed no obstructive coronary disease.She presented with AKI with creatinine up to 1.8, though her baseline is 0.8. She has been having chest pains and sob since Feb 2018  Plan Hold diuretics/ARB Hold off on IV fluids due to dependent edema Echo to rule out CHF,was wnl cards has signed off , scheduled for  stress test as outpatient

## 2017-04-14 NOTE — Progress Notes (Signed)
Inpatient Diabetes Program Recommendations  AACE/ADA: New Consensus Statement on Inpatient Glycemic Control (2015)  Target Ranges:  Prepandial:   less than 140 mg/dL      Peak postprandial:   less than 180 mg/dL (1-2 hours)      Critically ill patients:  140 - 180 mg/dL   Lab Results  Component Value Date   GLUCAP 202 (H) 04/14/2017   HGBA1C 9.6 (H) 06/22/2016    Review of Glycemic Control Results for Sue Davidson, Sue Davidson (MRN [id number]) as of 04/14/2017 09:44  Ref. Range 04/14/2017 02:22 04/14/2017 03:52 04/14/2017 08:36  Glucose-Capillary Latest Ref Range: 65 - 99 mg/dL 191 (H) 200 (H) 202 (H)   Diabetes history: DM2 Outpatient Diabetes medications: 75/25 70 units ac breakfast + 15-25 units ac lunch + 85 units ac dinner Current orders for Inpatient glycemic control: Novolog correction 0-20 q 4 hrs.  Inpatient Diabetes Program Recommendations:    Please consider: -Lantus 24 units daily and adjust as needed Will follow.  Thank you, Nani Gasser. Lenisha Lacap, RN, MSN, CDE  Diabetes Coordinator Inpatient Glycemic Control Team Team Pager [phone] (8am-5pm) 04/14/2017 10:10 AM

## 2017-04-15 DIAGNOSIS — N179 Acute kidney failure, unspecified: Secondary | ICD-10-CM | POA: Diagnosis not present

## 2017-04-15 DIAGNOSIS — I10 Essential (primary) hypertension: Secondary | ICD-10-CM | POA: Diagnosis not present

## 2017-04-15 DIAGNOSIS — I200 Unstable angina: Secondary | ICD-10-CM | POA: Diagnosis not present

## 2017-04-15 LAB — COMPREHENSIVE METABOLIC PANEL
ALT: 25 U/L (ref 14–54)
AST: 37 U/L (ref 15–41)
Albumin: 3.1 g/dL — ABNORMAL LOW (ref 3.5–5.0)
Alkaline Phosphatase: 85 U/L (ref 38–126)
Anion gap: 10 (ref 5–15)
BUN: 36 mg/dL — AB (ref 6–20)
CHLORIDE: 99 mmol/L — AB (ref 101–111)
CO2: 30 mmol/L (ref 22–32)
CREATININE: 1.3 mg/dL — AB (ref 0.44–1.00)
Calcium: 8.9 mg/dL (ref 8.9–10.3)
GFR calc Af Amer: 48 mL/min — ABNORMAL LOW (ref >60)
GFR calc non Af Amer: 42 mL/min — ABNORMAL LOW (ref >60)
Glucose, Bld: 119 mg/dL — ABNORMAL HIGH (ref 65–99)
Potassium: 3.8 mmol/L (ref 3.5–5.1)
SODIUM: 139 mmol/L (ref 135–145)
Total Bilirubin: 0.5 mg/dL (ref 0.3–1.2)
Total Protein: 6.7 g/dL (ref 6.5–8.1)

## 2017-04-15 LAB — CBC
HEMATOCRIT: 37.3 % (ref 36.0–46.0)
Hemoglobin: 11.5 g/dL — ABNORMAL LOW (ref 12.0–15.0)
MCH: 27.4 pg (ref 26.0–34.0)
MCHC: 30.8 g/dL (ref 30.0–36.0)
MCV: 89 fL (ref 78.0–100.0)
PLATELETS: 221 10*3/uL (ref 150–400)
RBC: 4.19 MIL/uL (ref 3.87–5.11)
RDW: 15.9 % — AB (ref 11.5–15.5)
WBC: 7.4 10*3/uL (ref 4.0–10.5)

## 2017-04-15 LAB — GLUCOSE, CAPILLARY
Glucose-Capillary: 103 mg/dL — ABNORMAL HIGH (ref 65–99)
Glucose-Capillary: 211 mg/dL — ABNORMAL HIGH (ref 65–99)
Glucose-Capillary: 251 mg/dL — ABNORMAL HIGH (ref 65–99)

## 2017-04-15 MED ORDER — INSULIN GLARGINE 100 UNIT/ML ~~LOC~~ SOLN
22.0000 [IU] | Freq: Every day | SUBCUTANEOUS | Status: DC
  Filled 2017-04-15: qty 0.22

## 2017-04-15 MED ORDER — FUROSEMIDE 40 MG PO TABS: 40.0000 mg | ORAL_TABLET | Freq: Every day | ORAL | 0 refills | Status: DC

## 2017-04-15 NOTE — Progress Notes (Signed)
Pt discharged home with family. Telemetry box removed and CCMD notified. IV removed. Pt received discharge instructions and all questions were answered. Pt left with all of her belongings. Pt discharged off the unit via wheelchair and was accompanied by pt's nurse tech.  Grant Fontana BSN, RN

## 2017-04-15 NOTE — Progress Notes (Signed)
Inpatient Diabetes Program Recommendations  AACE/ADA: New Consensus Statement on Inpatient Glycemic Control (2015)  Target Ranges:  Prepandial:   less than 140 mg/dL      Peak postprandial:   less than 180 mg/dL (1-2 hours)      Critically ill patients:  140 - 180 mg/dL   Results for Sue Davidson, Sue Davidson (MRN [id number]) as of 04/15/2017 11:29  Ref. Range 04/14/2017 08:36 04/14/2017 12:10 04/14/2017 16:39 04/14/2017 21:28 04/15/2017 00:00 04/15/2017 04:01 04/15/2017 07:40 04/15/2017 11:19  Glucose-Capillary Latest Ref Range: 65 - 99 mg/dL 202 (H) 188 (H) 281 (H) 253 (H) 225 (H) 103 (H) 211 (H) 251 (H)   Review of Glycemic Control  Diabetes history: DM2 Outpatient Diabetes medications: 75/25 70 units ac breakfast + 15-25 units ac lunch + 85 units ac dinner Current orders for Inpatient glycemic control: Novolog correction 0-20 q 4 hrs.  Inpatient Diabetes Program Recommendations:    Please consider: -Lantus 24 units daily and adjust as needed   Thanks,  Tama Headings RN, MSN, [facility] Inpatient Diabetes Coordinator Team Pager [phone] (8a-5p)

## 2017-04-15 NOTE — Discharge Summary (Signed)
Physician Discharge Summary  Kally Cadden MRN: [id number] DOB/AGE: [birth year] 66 y.o.  PCP: Deland Pretty, MD   Admit date: 04/13/2017 Discharge date: 04/15/2017  Discharge Diagnoses:    Principal Problem:   Chest pain Active Problems:   Insulin dependent diabetes mellitus (HCC)   OSA (obstructive sleep apnea)   Essential hypertension   AKI (acute kidney injury) ([place])   Hyponatremia    Follow-up recommendations Follow-up with PCP in 3-5 days , including all  additional recommended appointments as below Follow-up CBC, CMP in 3-5 days Patient has been scheduled for an outpatient 2 day stress test      Current Discharge Medication List    CONTINUE these medications which have CHANGED   Details  furosemide (LASIX) 40 MG tablet Take 1 tablet (40 mg total) by mouth daily. Alternates taking 90m then 858m Qty: 30 tablet, Refills: 0      CONTINUE these medications which have NOT CHANGED   Details  aspirin 81 MG tablet Take 81 mg by mouth daily.    cholecalciferol (VITAMIN D) 1000 units tablet Take 1,000 Units by mouth daily.    HUMALOG MIX 75/25 (75-25) 100 UNIT/ML SUSP injection Inject into the skin 3 (three) times daily with meals. 70 units every morning. 15-25 units at lunch on a sliding scale depending on blood sugar. 85 units with supper.    HYDROcodone-acetaminophen (NORCO) 5-325 MG tablet Take 1-2 tablets by mouth every 4 (four) hours as needed for moderate pain. Qty: 30 tablet, Refills: 0    levothyroxine (SYNTHROID, LEVOTHROID) 75 MCG tablet Take 75 mcg by mouth daily before breakfast.    methocarbamol (ROBAXIN) 500 MG tablet Take 1 tablet (500 mg total) by mouth 4 (four) times daily. As needed for muscle spasm Qty: 30 tablet, Refills: 1    omeprazole (PRILOSEC) 40 MG capsule Take 40 mg by mouth daily. Reported on 01/19/2016    ondansetron (ZOFRAN) 8 MG tablet Take 8 mg by mouth 2 (two) times daily as needed for nausea or vomiting.    rosuvastatin (CRESTOR) 20  MG tablet Take 20 mg by mouth daily with breakfast.       STOP taking these medications     ibuprofen (ADVIL,MOTRIN) 200 MG tablet      olmesartan-hydrochlorothiazide (BENICAR HCT) 40-25 MG tablet          Discharge Condition: Stable   Discharge Instructions Get Medicines reviewed and adjusted: Please take all your medications with you for your next visit with your Primary MD  Please request your Primary MD to go over all hospital tests and procedure/radiological results at the follow up, please ask your Primary MD to get all Hospital records sent to his/her office.  If you experience worsening of your admission symptoms, develop shortness of breath, life threatening emergency, suicidal or homicidal thoughts you must seek medical attention immediately by calling 911 or calling your MD immediately if symptoms less severe.  You must read complete instructions/literature along with all the possible adverse reactions/side effects for all the Medicines you take and that have been prescribed to you. Take any new Medicines after you have completely understood and accpet all the possible adverse reactions/side effects.   Do not drive when taking Pain medications.   Do not take more than prescribed Pain, Sleep and Anxiety Medications  Special Instructions: If you have smoked or chewed Tobacco in the last 2 yrs please stop smoking, stop any regular Alcohol and or any Recreational drug use.  Wear Seat belts while driving.  Please note  You were cared for by a hospitalist during your hospital stay. Once you are discharged, your primary care physician will handle any further medical issues. Please note that NO REFILLS for any discharge medications will be authorized once you are discharged, as it is imperative that you return to your primary care physician (or establish a relationship with a primary care physician if you do not have one) for your aftercare needs so that they can reassess  your need for medications and monitor your lab values.     Allergies  Allergen Reactions  . Tape Rash      Disposition: 01-Home or Self Care   Consults:  Cardiology    Significant Diagnostic Studies:  Dg Chest 2 View  Result Date: 04/13/2017 CLINICAL DATA:  Pain across the lower chest and diaphragm for 1 month EXAM: CHEST  2 VIEW COMPARISON:  01/12/2017 FINDINGS: The heart size and mediastinal contours are within normal limits. Both lungs are clear. The visualized skeletal structures are unremarkable. Surgical clips in the right upper quadrant IMPRESSION: No active cardiopulmonary disease. Electronically Signed   By: Donavan Foil M.D.   On: 04/13/2017 20:01   US Renal  Result Date: 04/14/2017 CLINICAL DATA:  Acute renal injury EXAM: RENAL / URINARY TRACT ULTRASOUND COMPLETE COMPARISON:  Abdominal ultrasound of October 15, 2011 FINDINGS: Right Kidney: Length: 11.1 cm. The renal cortical echotexture remains lower than that of the liver. There is no hydronephrosis nor cystic nor solid mass. Left Kidney: Length: 11.7 cm. The renal cortical echotexture is similar to that on the right. There is no hydronephrosis nor cystic or solid mass. The partially distended urinary bladder is normal. Bilateral ureteral jets are observed. 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 Bladder: Appears normal for degree of bladder distention. Bilateral ureteral jets are observed. IMPRESSION: Normal renal ultrasound.  Normal appearance of the urinary bladder. Electronically Signed   By: David  Martinique M.D.   On: 04/14/2017 16:35   US Breast Ltd Uni Left Inc Axilla  Addendum Date: 04/14/2017   ADDENDUM REPORT: 04/14/2017 10:25 ADDENDUM: The patient presented on 04/07/2017 for the stereotactic biopsy of the questioned distortion in the medial left breast. In preparation for the biopsy, several images of the  medial left breast were obtained. No distortion could be identified for sampling. The tissue appears normal without distortion, mass or abnormal calcifications. I discussed this with the patient and told her that there is no abnormality amenable to biopsy, and therefore I recommend that the biopsy be canceled and the visit today will be at no charge to the patient. A follow-up diagnostic left breast mammogram should be performed in 6 months as further confirmation of the resolution of abnormality of concern. Electronically Signed   By: Ammie Ferrier M.D.   On: 04/14/2017 10:25   Result Date: 04/14/2017 CLINICAL DATA:  Patient returns today to evaluate a possible left breast distortion identified on recent screening mammogram. EXAM: 2D DIGITAL DIAGNOSTIC LEFT MAMMOGRAM WITH CAD AND ADJUNCT TOMO ULTRASOUND LEFT BREAST COMPARISON:  Previous exam(s). ACR Breast Density Category b: There are scattered areas of fibroglandular density. FINDINGS: There is a persistent small subtle distortion within the left breast on today's rolled CC view, tomosynthesis slice 31, at middle to posterior depth. This distortion is localized to the inner left breast, 9-10 o'clock axis region, based on the earlier screening mammogram CC view.  No corresponding distortion is seen on today's spot compression or true lateral views. Mammographic images were processed with CAD. Targeted ultrasound is performed, showing no correlate for the subtle left breast distortion. Left axillary ultrasound shows no enlarged or morphologically abnormal lymph nodes. IMPRESSION: New small subtle architectural distortion within the left breast, only visible on today's rolled CC view (tomosynthesis slice 31), without correlate on MLO or true lateral views, localized to the inner left breast at the 9-10 o'clock axis region based on earlier screening mammogram. This is a suspicious finding for which stereotactic biopsy is recommended. RECOMMENDATION: Stereotactic  biopsy, with 3D tomosynthesis guidance, for the small subtle left breast distortion. Patient was informed that the distortion may not be adequately visible for biopsy on the preprocedure mammogram and, if so, a six-month follow-up may be recommended and would be reasonable. Stereotactic biopsy is scheduled for May 31st. I have discussed the findings and recommendations with the patient. Results were also provided in writing at the conclusion of the visit. If applicable, a reminder letter will be sent to the patient regarding the next appointment. BI-RADS CATEGORY  4: Suspicious. Electronically Signed: By: Franki Cabot M.D. On: 04/01/2017 16:44   Mm Diag Breast Tomo Uni Left  Addendum Date: 04/14/2017   ADDENDUM REPORT: 04/14/2017 10:25 ADDENDUM: The patient presented on 04/07/2017 for the stereotactic biopsy of the questioned distortion in the medial left breast. In preparation for the biopsy, several images of the medial left breast were obtained. No distortion could be identified for sampling. The tissue appears normal without distortion, mass or abnormal calcifications. I discussed this with the patient and told her that there is no abnormality amenable to biopsy, and therefore I recommend that the biopsy be canceled and the visit today will be at no charge to the patient. A follow-up diagnostic left breast mammogram should be performed in 6 months as further confirmation of the resolution of abnormality of concern. Electronically Signed   By: Ammie Ferrier M.D.   On: 04/14/2017 10:25   Result Date: 04/14/2017 CLINICAL DATA:  Patient returns today to evaluate a possible left breast distortion identified on recent screening mammogram. EXAM: 2D DIGITAL DIAGNOSTIC LEFT MAMMOGRAM WITH CAD AND ADJUNCT TOMO ULTRASOUND LEFT BREAST COMPARISON:  Previous exam(s). ACR Breast Density Category b: There are scattered areas of fibroglandular density. FINDINGS: There is a persistent small subtle distortion within the  left breast on today's rolled CC view, tomosynthesis slice 31, at middle to posterior depth. This distortion is localized to the inner left breast, 9-10 o'clock axis region, based on the earlier screening mammogram CC view. No corresponding distortion is seen on today's spot compression or true lateral views. Mammographic images were processed with CAD. Targeted ultrasound is performed, showing no correlate for the subtle left breast distortion. Left axillary ultrasound shows no enlarged or morphologically abnormal lymph nodes. IMPRESSION: New small subtle architectural distortion within the left breast, only visible on today's rolled CC view (tomosynthesis slice 31), without correlate on MLO or true lateral views, localized to the inner left breast at the 9-10 o'clock axis region based on earlier screening mammogram. This is a suspicious finding for which stereotactic biopsy is recommended. RECOMMENDATION: Stereotactic biopsy, with 3D tomosynthesis guidance, for the small subtle left breast distortion. Patient was informed that the distortion may not be adequately visible for biopsy on the preprocedure mammogram and, if so, a six-month follow-up may be recommended and would be reasonable. Stereotactic biopsy is scheduled for May 31st. I have discussed the findings  and recommendations with the patient. Results were also provided in writing at the conclusion of the visit. If applicable, a reminder letter will be sent to the patient regarding the next appointment. BI-RADS CATEGORY  4: Suspicious. Electronically Signed: By: Franki Cabot M.D. On: 04/01/2017 16:44   Mm Screening Breast Tomo Bilateral  Result Date: 03/28/2017 CLINICAL DATA:  Screening. EXAM: 2D DIGITAL SCREENING BILATERAL MAMMOGRAM WITH CAD AND ADJUNCT TOMO COMPARISON:  Previous exam(s). ACR Breast Density Category b: There are scattered areas of fibroglandular density. FINDINGS: In the left breast, possible distortion warrants further evaluation. In  the right breast, no findings suspicious for malignancy. Images were processed with CAD. IMPRESSION: Further evaluation is suggested for possible distortion in the left breast. RECOMMENDATION: Diagnostic mammogram and possibly ultrasound of the left breast. (Code:[id number]-L-[id number]) The patient will be contacted regarding the findings, and additional imaging will be scheduled. BI-RADS CATEGORY  0: Incomplete. Need additional imaging evaluation and/or prior mammograms for comparison. Electronically Signed   By: Nolon Nations M.D.   On: 03/28/2017 10:17   Mm Lt Breast Bx W Loc Dev 1st Lesion Image Bx Spec Stereo Guide  Result Date: 04/07/2017 : The patient presented today for the stereotactic biopsy of the questioned distortion in the medial left breast. In preparation for the biopsy, several images of the medial left breast were obtained. No distortion could be identified for sampling. The tissue appears normal without distortion, mass or abnormal calcifications. I discussed this with the patient and told her that there is no abnormality amenable to biopsy, and therefore I recommend that the biopsy be canceled and the visit today will be at no charge to the patient. A follow-up diagnostic left breast mammogram should be performed in 6 months as further confirmation of the resolution of abnormality of concern. Electronically Signed   By: Ammie Ferrier M.D.   On: 04/07/2017 11:21    echocardiogram  LV EF: 55% -   60%  ------------------------------------------------------------------- Indications:      Dyspnea 786.09.  ------------------------------------------------------------------- History:   PMH:  Chest pain, atypical. OSA on CPAP. Edema. AKI. Carotic artery disease.  Risk factors:  Hypertension. Diabetes mellitus. Dyslipidemia.  ------------------------------------------------------------------- Study Conclusions  - Left ventricle: The cavity size was normal. Wall thickness was   increased  in a pattern of mild LVH. Systolic function was normal.   The estimated ejection fraction was in the range of 55% to 60%.   Wall motion was normal; there were no regional wall motion   abnormalities. Doppler parameters are consistent with abnormal   left ventricular relaxation (grade 1 diastolic dysfunction). - Aortic valve: Valve area (VTI): 1.63 cm^2. Valve area (Vmax):   1.57 cm^2. Valve area (Vmean): 1.37 cm^2. - Mitral valve: There was mild regurgitation. - Left atrium: The atrium was mildly dilated     Filed Weights   04/14/17 0223  Weight: 121.1 kg (266 lb 14.4 oz)     Microbiology: Recent Results (from the past 240 hour(s))  MRSA PCR Screening     Status: None   Collection Time: 04/14/17  2:26 AM  Result Value Ref Range Status   MRSA by PCR NEGATIVE NEGATIVE Final    Comment:        The GeneXpert MRSA Assay (FDA approved for NASAL specimens only), is one component of a comprehensive MRSA colonization surveillance program. It is not intended to diagnose MRSA infection nor to guide or monitor treatment for MRSA infections.        Blood Culture No results  found for: SDES, [place], CULT, REPTSTATUS    Labs: Results for orders placed or performed during the hospital encounter of 04/13/17 (from the past 48 hour(s))  Basic metabolic panel     Status: Abnormal   Collection Time: 04/13/17  6:50 PM  Result Value Ref Range   Sodium 133 (L) 135 - 145 mmol/L   Potassium 4.3 3.5 - 5.1 mmol/L   Chloride 94 (L) 101 - 111 mmol/L   CO2 28 22 - 32 mmol/L   Glucose, Bld 193 (H) 65 - 99 mg/dL   BUN 36 (H) 6 - 20 mg/dL   Creatinine, Ser 1.73 (H) 0.44 - 1.00 mg/dL   Calcium 9.2 8.9 - 10.3 mg/dL   GFR calc non Af Amer 30 (L) >60 mL/min   GFR calc Af Amer 34 (L) >60 mL/min    Comment: (NOTE) The eGFR has been calculated using the CKD EPI equation. This calculation has not been validated in all clinical situations. eGFR's persistently <60 mL/min signify possible  Chronic Kidney Disease.    Anion gap 11 5 - 15  CBC     Status: Abnormal   Collection Time: 04/13/17  6:50 PM  Result Value Ref Range   WBC 11.8 (H) 4.0 - 10.5 K/uL   RBC 4.74 3.87 - 5.11 MIL/uL   Hemoglobin 13.3 12.0 - 15.0 g/dL   HCT 41.1 36.0 - 46.0 %   MCV 86.7 78.0 - 100.0 fL   MCH 28.1 26.0 - 34.0 pg   MCHC 32.4 30.0 - 36.0 g/dL   RDW 15.5 11.5 - 15.5 %   Platelets 308 150 - 400 K/uL  Brain natriuretic peptide     Status: None   Collection Time: 04/13/17  6:50 PM  Result Value Ref Range   B Natriuretic Peptide 36.2 0.0 - 100.0 pg/mL  I-stat troponin, ED     Status: None   Collection Time: 04/13/17  7:12 PM  Result Value Ref Range   Troponin i, poc 0.02 0.00 - 0.08 ng/mL   Comment 3            Comment: Due to the release kinetics of cTnI, a negative result within the first hours of the onset of symptoms does not rule out myocardial infarction with certainty. If myocardial infarction is still suspected, repeat the test at appropriate intervals.   Troponin I-serum (0, 3, 6 hours)     Status: Abnormal   Collection Time: 04/14/17  2:21 AM  Result Value Ref Range   Troponin I 0.05 (HH) <0.03 ng/mL    Comment: CRITICAL RESULT CALLED TO, READ BACK BY AND VERIFIED WITH: T.EJMONDU,RN 0354 04/14/17 M.CAMPBELL   Osmolality     Status: Abnormal   Collection Time: 04/14/17  2:21 AM  Result Value Ref Range   Osmolality 302 (H) 275 - 295 mOsm/kg    Comment: CRITICAL RESULT CALLED TO, READ BACK BY AND VERIFIED WITH: RN,TERESA EJIMDU [id number] @0440  THANEY   Glucose, capillary     Status: Abnormal   Collection Time: 04/14/17  2:22 AM  Result Value Ref Range   Glucose-Capillary 191 (H) 65 - 99 mg/dL  MRSA PCR Screening     Status: None   Collection Time: 04/14/17  2:26 AM  Result Value Ref Range   MRSA by PCR NEGATIVE NEGATIVE    Comment:        The GeneXpert MRSA Assay (FDA approved for NASAL specimens only), is one component of a comprehensive MRSA  colonization surveillance program. It is  not intended to diagnose MRSA infection nor to guide or monitor treatment for MRSA infections.   Sodium, urine, random     Status: None   Collection Time: 04/14/17  3:52 AM  Result Value Ref Range   Sodium, Ur 81 mmol/L  Glucose, capillary     Status: Abnormal   Collection Time: 04/14/17  3:52 AM  Result Value Ref Range   Glucose-Capillary 200 (H) 65 - 99 mg/dL   Comment 1 Notify RN    Comment 2 Document in Chart   Urinalysis, Routine w reflex microscopic     Status: Abnormal   Collection Time: 04/14/17  4:00 AM  Result Value Ref Range   Color, Urine YELLOW YELLOW   APPearance HAZY (A) CLEAR   Specific Gravity, Urine 1.019 1.005 - 1.030   pH 5.0 5.0 - 8.0   Glucose, UA NEGATIVE NEGATIVE mg/dL   Hgb urine dipstick NEGATIVE NEGATIVE   Bilirubin Urine NEGATIVE NEGATIVE   Ketones, ur NEGATIVE NEGATIVE mg/dL   Protein, ur NEGATIVE NEGATIVE mg/dL   Nitrite NEGATIVE NEGATIVE   Leukocytes, UA NEGATIVE NEGATIVE  Troponin I-serum (0, 3, 6 hours)     Status: None   Collection Time: 04/14/17  4:40 AM  Result Value Ref Range   Troponin I <0.03 <0.03 ng/mL  Osmolality, urine     Status: None   Collection Time: 04/14/17  6:12 AM  Result Value Ref Range   Osmolality, Ur 604 300 - 900 mOsm/kg  Troponin I-serum (0, 3, 6 hours)     Status: None   Collection Time: 04/14/17  7:11 AM  Result Value Ref Range   Troponin I <0.03 <0.03 ng/mL  Basic metabolic panel     Status: Abnormal   Collection Time: 04/14/17  7:11 AM  Result Value Ref Range   Sodium 136 135 - 145 mmol/L   Potassium 4.3 3.5 - 5.1 mmol/L   Chloride 96 (L) 101 - 111 mmol/L   CO2 29 22 - 32 mmol/L   Glucose, Bld 187 (H) 65 - 99 mg/dL   BUN 42 (H) 6 - 20 mg/dL   Creatinine, Ser 1.83 (H) 0.44 - 1.00 mg/dL   Calcium 8.7 (L) 8.9 - 10.3 mg/dL   GFR calc non Af Amer 28 (L) >60 mL/min   GFR calc Af Amer 32 (L) >60 mL/min    Comment: (NOTE) The eGFR has been calculated using the CKD EPI  equation. This calculation has not been validated in all clinical situations. eGFR's persistently <60 mL/min signify possible Chronic Kidney Disease.    Anion gap 11 5 - 15  Glucose, capillary     Status: Abnormal   Collection Time: 04/14/17  8:36 AM  Result Value Ref Range   Glucose-Capillary 202 (H) 65 - 99 mg/dL  Glucose, capillary     Status: Abnormal   Collection Time: 04/14/17 12:10 PM  Result Value Ref Range   Glucose-Capillary 188 (H) 65 - 99 mg/dL  Glucose, capillary     Status: Abnormal   Collection Time: 04/14/17  4:39 PM  Result Value Ref Range   Glucose-Capillary 281 (H) 65 - 99 mg/dL  Glucose, capillary     Status: Abnormal   Collection Time: 04/14/17  9:28 PM  Result Value Ref Range   Glucose-Capillary 253 (H) 65 - 99 mg/dL   Comment 1 Notify RN    Comment 2 Document in Chart   Glucose, capillary     Status: Abnormal   Collection Time: 04/15/17 12:00 AM  Result Value Ref Range   Glucose-Capillary 225 (H) 65 - 99 mg/dL  Comprehensive metabolic panel     Status: Abnormal   Collection Time: 04/15/17  3:11 AM  Result Value Ref Range   Sodium 139 135 - 145 mmol/L   Potassium 3.8 3.5 - 5.1 mmol/L   Chloride 99 (L) 101 - 111 mmol/L   CO2 30 22 - 32 mmol/L   Glucose, Bld 119 (H) 65 - 99 mg/dL   BUN 36 (H) 6 - 20 mg/dL   Creatinine, Ser 1.30 (H) 0.44 - 1.00 mg/dL   Calcium 8.9 8.9 - 10.3 mg/dL   Total Protein 6.7 6.5 - 8.1 g/dL   Albumin 3.1 (L) 3.5 - 5.0 g/dL   AST 37 15 - 41 U/L   ALT 25 14 - 54 U/L   Alkaline Phosphatase 85 38 - 126 U/L   Total Bilirubin 0.5 0.3 - 1.2 mg/dL   GFR calc non Af Amer 42 (L) >60 mL/min   GFR calc Af Amer 48 (L) >60 mL/min    Comment: (NOTE) The eGFR has been calculated using the CKD EPI equation. This calculation has not been validated in all clinical situations. eGFR's persistently <60 mL/min signify possible Chronic Kidney Disease.    Anion gap 10 5 - 15  CBC     Status: Abnormal   Collection Time: 04/15/17  3:11 AM   Result Value Ref Range   WBC 7.4 4.0 - 10.5 K/uL   RBC 4.19 3.87 - 5.11 MIL/uL   Hemoglobin 11.5 (L) 12.0 - 15.0 g/dL   HCT 37.3 36.0 - 46.0 %   MCV 89.0 78.0 - 100.0 fL   MCH 27.4 26.0 - 34.0 pg   MCHC 30.8 30.0 - 36.0 g/dL   RDW 15.9 (H) 11.5 - 15.5 %   Platelets 221 150 - 400 K/uL  Glucose, capillary     Status: Abnormal   Collection Time: 04/15/17  4:01 AM  Result Value Ref Range   Glucose-Capillary 103 (H) 65 - 99 mg/dL  Glucose, capillary     Status: Abnormal   Collection Time: 04/15/17  7:40 AM  Result Value Ref Range   Glucose-Capillary 211 (H) 65 - 99 mg/dL  Glucose, capillary     Status: Abnormal   Collection Time: 04/15/17 11:19 AM  Result Value Ref Range   Glucose-Capillary 251 (H) 65 - 99 mg/dL   Comment 1 Notify RN      Lipid Panel     Component Value Date/Time   CHOL 225 (H) 07/15/2011 0653   TRIG 186 (H) 07/15/2011 0653   HDL 30 (L) 07/15/2011 0653   CHOLHDL 7.5 07/15/2011 0653   VLDL 37 07/15/2011 0653   LDLCALC 158 (H) 07/15/2011 0653     Lab Results  Component Value Date   HGBA1C 9.6 (H) 06/22/2016   HGBA1C 8.1 (H) 07/14/2011       HPI :  Sue Davidson is a 66 y.o. female with a hx of OSA on CPAP, HTN, DM, HLD, mild pHTN, carotid artery disease, morbid obesity, chronic bilateral lower extremity edema and exertional dyspnea who is being seen today for the evaluation of chest pain at the request of Dr. Allyson Sabal.   She underwent cardiac catheterization most recently in October 2012 which showed no evidence of coronary obstructive disease. Left ventricular end-diastolic pressure was 10 mmHg. 2 years earlier in 2010 she had right and left heart catheterization that showed a pulmonary artery wedge pressure of 6 and a PA  pressure of 22/9. Prior to that she had normal coronary angiograms performed in 1997 and in 2003.   Echocardiography April 2017 showed normal left ventricular systolic function and minimum signs of diastolic dysfunction with normal  filling pressures, but did show mild pulmonary hypertension with PA pressure 38.   Last carotid doppler 04/05/17 showed mild plaque bilaterally. Mother had a CABG in her 11s. Father had a CABG at age 38. Brother had a CABG at age 48.  HOSPITAL COURSE:   1. Chest pain:  Atypical.  HEART score 4.   Cardiac enzymes were negative Cardiology was consulted, they recommend outpatient stress test Echo showed normal LV function to 55-60%, no WM abnormality, grade 1 DD, mild MR and mild dilated LA.   2. Acute kidney injury:  Unclear cause.  Most likely is pre-renal injury from furosemide use in setting of ARB.  No NSAIDs.    3. Hypertension, peripheral vascular disease:  -Continue statin, aspirin Benicar/HCTZ on hold Patient did receive 1 fluid bolus and creatinine improved from 1.8-1.3  4. Type 2 diabetse:  Continue home 75/25. Hemoglobin A1c pending  5. Hypothyroidism: -Continue levothyroxine. Repeat function studies in the outpatient setting     6. Other medications:  -Continue PPI as pantoprazole -Continue Robaxin      Discharge Exam:   Blood pressure (!) 127/55, pulse 70, temperature 97.5 F (36.4 C), temperature source Oral, resp. rate 17, height 5' 6"  (1.676 m), weight 121.1 kg (266 lb 14.4 oz), SpO2 95 %.  Cardiac:  normal S1, S2; RRR; II/VI systolic murmur  Lungs:  clear to auscultation bilaterally, no wheezing, rhonchi or rales  Abd: soft, nontender, no hepatomegaly  Ext: trace edema Musculoskeletal:  No deformities, BUE and BLE strength normal and equal Skin: warm and dry  Neuro:  CNs 2-12 intact, no focal abnormalities noted Psych:  Normal affect     Follow-up Information    [facility]. Go on 05/04/2017.   Specialty:  Cardiology Why:  05/04/17 @ 12:30 for day 1 of 2 days stress test 05/05/17 @ 12:30 for day 2 of 2 days stress test  Please arrive 15 mintues early Contact information: [address] [place]  [place] [phone]       Sanda Klein, MD. Go on 05/19/2017.   Specialty:  Cardiology Why:  @2 :40 for regular & hospital follow up Contact information: [address] [place] [address] [phone]        Deland Pretty, MD. Call.   Specialty:  Internal Medicine Why:  Hospital follow-up in 3-5 days Please check renal function Contact information: [address] [place] [address] [phone]           Signed: Reyne Dumas 04/15/2017, 1:12 PM        Time spent >1 hour

## 2017-04-16 LAB — HEMOGLOBIN A1C
Hgb A1c MFr Bld: 8.6 % — ABNORMAL HIGH (ref 4.8–5.6)
Mean Plasma Glucose: 200 mg/dL

## 2017-04-25 DIAGNOSIS — E559 Vitamin D deficiency, unspecified: Secondary | ICD-10-CM | POA: Diagnosis not present

## 2017-04-25 DIAGNOSIS — Z09 Encounter for follow-up examination after completed treatment for conditions other than malignant neoplasm: Secondary | ICD-10-CM | POA: Diagnosis not present

## 2017-04-25 DIAGNOSIS — E1165 Type 2 diabetes mellitus with hyperglycemia: Secondary | ICD-10-CM | POA: Diagnosis not present

## 2017-04-25 DIAGNOSIS — E119 Type 2 diabetes mellitus without complications: Secondary | ICD-10-CM | POA: Diagnosis not present

## 2017-04-26 NOTE — Addendum Note (Signed)
Encounter addended by: Campbell Riches on: 04/26/2017  3:27 PM<BR>    Actions taken: Order list changed

## 2017-04-28 DIAGNOSIS — I10 Essential (primary) hypertension: Secondary | ICD-10-CM | POA: Diagnosis not present

## 2017-05-04 ENCOUNTER — Inpatient Hospital Stay (HOSPITAL_COMMUNITY): Admit: 2017-05-04 | Payer: 59

## 2017-05-05 ENCOUNTER — Ambulatory Visit (HOSPITAL_COMMUNITY): Payer: 59

## 2017-05-06 ENCOUNTER — Telehealth (HOSPITAL_COMMUNITY): Payer: Self-pay

## 2017-05-06 DIAGNOSIS — G4733 Obstructive sleep apnea (adult) (pediatric): Secondary | ICD-10-CM | POA: Diagnosis not present

## 2017-05-06 NOTE — Telephone Encounter (Signed)
Encounter complete. 

## 2017-05-12 ENCOUNTER — Ambulatory Visit (HOSPITAL_COMMUNITY)
Admission: RE | Admit: 2017-05-12 | Discharge: 2017-05-12 | Disposition: A | Discharge status: Home / Self Care | Payer: 59 | Source: Ambulatory Visit | Attending: Internal Medicine | Admitting: Internal Medicine

## 2017-05-12 DIAGNOSIS — Z8249 Family history of ischemic heart disease and other diseases of the circulatory system: Secondary | ICD-10-CM | POA: Insufficient documentation

## 2017-05-12 DIAGNOSIS — E669 Obesity, unspecified: Secondary | ICD-10-CM | POA: Diagnosis not present

## 2017-05-12 DIAGNOSIS — E119 Type 2 diabetes mellitus without complications: Secondary | ICD-10-CM | POA: Insufficient documentation

## 2017-05-12 DIAGNOSIS — I10 Essential (primary) hypertension: Secondary | ICD-10-CM | POA: Insufficient documentation

## 2017-05-12 DIAGNOSIS — I779 Disorder of arteries and arterioles, unspecified: Secondary | ICD-10-CM | POA: Insufficient documentation

## 2017-05-12 DIAGNOSIS — R0609 Other forms of dyspnea: Secondary | ICD-10-CM | POA: Diagnosis not present

## 2017-05-12 DIAGNOSIS — Z6841 Body Mass Index (BMI) 40.0 and over, adult: Secondary | ICD-10-CM | POA: Insufficient documentation

## 2017-05-12 DIAGNOSIS — R0789 Other chest pain: Secondary | ICD-10-CM | POA: Diagnosis not present

## 2017-05-12 DIAGNOSIS — I2510 Atherosclerotic heart disease of native coronary artery without angina pectoris: Secondary | ICD-10-CM | POA: Diagnosis present

## 2017-05-12 DIAGNOSIS — G4733 Obstructive sleep apnea (adult) (pediatric): Secondary | ICD-10-CM | POA: Insufficient documentation

## 2017-05-12 MED ORDER — REGADENOSON 0.4 MG/5ML IV SOLN
0.4000 mg | Freq: Once | INTRAVENOUS | Status: AC
  Administered 2017-05-12: 0.4 mg via INTRAVENOUS

## 2017-05-12 MED ORDER — TECHNETIUM TC 99M TETROFOSMIN IV KIT
30.1000 | PACK | Freq: Once | INTRAVENOUS | Status: AC | PRN
  Administered 2017-05-12: 30.1 via INTRAVENOUS
  Filled 2017-05-12: qty 31

## 2017-05-13 ENCOUNTER — Ambulatory Visit (HOSPITAL_COMMUNITY)
Admission: RE | Admit: 2017-05-13 | Discharge: 2017-05-13 | Disposition: A | Discharge status: Home / Self Care | Payer: 59 | Source: Ambulatory Visit | Attending: Cardiovascular Disease | Admitting: Cardiovascular Disease

## 2017-05-13 LAB — MYOCARDIAL PERFUSION IMAGING
CHL CUP NUCLEAR SRS: 0
CSEPPHR: 81 {beats}/min
LV dias vol: 87 mL (ref 46–106)
LVSYSVOL: 23 mL
NUC STRESS TID: 1.07
Rest HR: 62 {beats}/min
SDS: 6
SSS: 6

## 2017-05-13 MED ORDER — TECHNETIUM TC 99M TETROFOSMIN IV KIT
29.4000 | PACK | Freq: Once | INTRAVENOUS | Status: AC | PRN
  Administered 2017-05-13: 29.4 via INTRAVENOUS

## 2017-05-19 ENCOUNTER — Encounter: Payer: Self-pay | Admitting: Cardiovascular Disease

## 2017-05-19 ENCOUNTER — Ambulatory Visit (INDEPENDENT_AMBULATORY_CARE_PROVIDER_SITE_OTHER): Payer: 59 | Admitting: Cardiovascular Disease

## 2017-05-19 VITALS — BP 156/72 | HR 84 | Ht 66.0 in | Wt 270.0 lb

## 2017-05-19 DIAGNOSIS — R600 Localized edema: Secondary | ICD-10-CM

## 2017-05-19 DIAGNOSIS — M109 Gout, unspecified: Secondary | ICD-10-CM | POA: Diagnosis not present

## 2017-05-19 DIAGNOSIS — I10 Essential (primary) hypertension: Secondary | ICD-10-CM

## 2017-05-19 DIAGNOSIS — IMO0001 Reserved for inherently not codable concepts without codable children: Secondary | ICD-10-CM

## 2017-05-19 DIAGNOSIS — G4733 Obstructive sleep apnea (adult) (pediatric): Secondary | ICD-10-CM

## 2017-05-19 DIAGNOSIS — E785 Hyperlipidemia, unspecified: Secondary | ICD-10-CM | POA: Diagnosis not present

## 2017-05-19 DIAGNOSIS — Z794 Long term (current) use of insulin: Secondary | ICD-10-CM | POA: Diagnosis not present

## 2017-05-19 DIAGNOSIS — E119 Type 2 diabetes mellitus without complications: Secondary | ICD-10-CM | POA: Diagnosis not present

## 2017-05-19 DIAGNOSIS — M10372 Gout due to renal impairment, left ankle and foot: Secondary | ICD-10-CM | POA: Diagnosis not present

## 2017-05-19 DIAGNOSIS — Z79899 Other long term (current) drug therapy: Secondary | ICD-10-CM

## 2017-05-19 DIAGNOSIS — I2721 Secondary pulmonary arterial hypertension: Secondary | ICD-10-CM | POA: Diagnosis not present

## 2017-05-19 MED ORDER — OLMESARTAN MEDOXOMIL 40 MG PO TABS
40.0000 mg | ORAL_TABLET | Freq: Every day | ORAL | 3 refills | Status: DC
Start: 2017-05-19 — End: 2018-11-09

## 2017-05-19 NOTE — Patient Instructions (Signed)
Medication Instructions: Dr Sallyanne Kuster has recommended making the following medication changes: 1. INCREASE Olmesartan to 40 mg daily  Labwork: Your physician recommends that you return for lab work TODAY.  Testing/Procedures: NONE ORDERED  Follow-up: Dr Sallyanne Kuster recommends that you schedule a follow-up appointment in 12 months. You will receive a reminder letter in the mail two months in advance. If you don't receive a letter, please call our office to schedule the follow-up appointment.  If you need a refill on your cardiac medications before your next appointment, please call your pharmacy.

## 2017-05-19 NOTE — Progress Notes (Signed)
Patient ID: Sue Davidson, female   DOB: July 02, 1951, 66 y.o.   MRN: [id number]    Cardiology Office Note    Date:  05/19/2017   ID:  Sue Davidson, DOB 18-Jul-1951, MRN [id number]  PCP:  Deland Pretty, MD  Cardiologist:   Sanda Klein, MD   Chief Complaint  Patient presents with  . Follow-up    6 months  . Edema    Feet and ankles.    History of Present Illness:  Sue Davidson is a 66 y.o. female with Obesity and obstructive sleep apnea, complaints of bilateral lower extremity edema and class 2 exertional dyspnea, normal left ventricular systolic function and minimum signs of diastolic dysfunction with normal filling pressures, mild pulmonary hypertension by echo.  Overall she feels that her breathing is okay, stable. She denies angina pectoris and has not had dizziness, lightheadedness, syncope or significant leg edema. She complains about sudden severe pain in her left ankle starting early today. A few days ago similar pain started in her right great toe. Both sites are very tender to touch, although not particularly erythematous. She has never had gout. She is scheduled to see her orthopedic surgeon tomorrow. Her blood pressure is high, she reports that this has been a problem ever since her insurance company assistance she take generic olmesartan rather than Benicar. She reports that usually her blood pressure is well controlled.  Sue Davidson has type 2 diabetes mellitus that requires treatment with insulin (Dr. Chalmers Cater), treated hyperlipidemia, treated hypertension, treated hypothyroidism. She was diagnosed many years ago with obstructive sleep apnea (sleep study performed in 2007), but has never been able to tolerate treatment with CPAP. She underwent cardiac catheterization most recently in October 2012 which showed no evidence of coronary obstructive disease. Left ventricular end-diastolic pressure was 10 mmHg. 2 years earlier in 2010 she had right and left heart catheterization that showed a  pulmonary artery wedge pressure of 6 and a PA pressure of 22/9. Prior to that she had normal coronary angiograms performed in 1997 and in 2003.  Echocardiography performed in April 2017 shows: - Left ventricle: The cavity size was normal. Wall thickness was  normal. Systolic function was normal. The estimated ejection  fraction was in the range of 60% to 65%. There was an increased  relative contribution of atrial contraction to ventricular  filling. Doppler parameters are consistent with abnormal left  ventricular relaxation (grade 1 diastolic dysfunction). - Aortic valve: Mildly thickened, mildly calcified leaflets. - Atrial septum: No defect or patent foramen ovale was identified. - Tricuspid valve: There was trivial regurgitation. - Pulmonic valve: There was trivial regurgitation. - Pulmonary arteries: PA peak pressure: 38 mm Hg (S). - Impressions: Normal LVF with grade 1 diastolic dysfunction. Mild  pulmonary HTN. Mild AV sclerosis  Past Medical History:  Diagnosis Date  . Anemia    hx of  . Anginal pain (HCC)    hx of  . Anxiety   . Depression   . Diabetes mellitus without complication ([place])   . GERD (gastroesophageal reflux disease)   . Headache(784.0)   . Heart murmur   . Hyperlipidemia   . Hypertension   . MRSA (methicillin resistant staph aureus) culture positive    many years ago-abdominal wound- no issuses since.  Marland Kitchen PONV (postoperative nausea and vomiting)    severe  . Sleep apnea    Study done -remains under evaluation- no cpap yet.  . Vitamin D deficiency     Past Surgical History:  Procedure Laterality Date  .  ABDOMINAL HYSTERECTOMY  1970's  . APPENDECTOMY  1970's  . BREAST BIOPSY  02/09/1996  . BREAST EXCISIONAL BIOPSY Left 2000   no visible scar  . CARDIAC CATHETERIZATION    . CHOLECYSTECTOMY N/A 10/02/2013   Procedure: LAPAROSCOPIC CHOLECYSTECTOMY;  Surgeon: Earnstine Regal, MD;  Location: [facility];  Service: General;  Laterality: N/A;  . cyst  removed Left    wrist"ganglion"  . ELBOW SURGERY Left   . HARDWARE REMOVAL Left    ankle  . KNEE ARTHROSCOPY Right 06/30/2016   Procedure: ARTHROSCOPY RIGHT KNEE WITH MEDIAL AND LATERAL MENSICAL DEBRIDEMENT;  Surgeon: Gaynelle Arabian, MD;  Location: [facility];  Service: Orthopedics;  Laterality: Right;  LMA  . LEG SURGERY Left 1980's   broke leg and ankle  . SHOULDER ARTHROSCOPY Bilateral   . TONSILLECTOMY  1970's  . TUBAL LIGATION  1970's    Outpatient Medications Prior to Visit  Medication Sig Dispense Refill  . aspirin 81 MG tablet Take 81 mg by mouth daily.    . cholecalciferol (VITAMIN D) 1000 units tablet Take 1,000 Units by mouth daily.    . furosemide (LASIX) 40 MG tablet Take 1 tablet (40 mg total) by mouth daily. Alternates taking 40mg  then 80mg . 30 tablet 0  . HUMALOG MIX 75/25 (75-25) 100 UNIT/ML SUSP injection Inject into the skin 3 (three) times daily with meals. 70 units every morning. 15-25 units at lunch on a sliding scale depending on blood sugar. 85 units with supper.    Marland Kitchen HYDROcodone-acetaminophen (NORCO) 5-325 MG tablet Take 1-2 tablets by mouth every 4 (four) hours as needed for moderate pain. 30 tablet 0  . levothyroxine (SYNTHROID, LEVOTHROID) 75 MCG tablet Take 75 mcg by mouth daily before breakfast.    . methocarbamol (ROBAXIN) 500 MG tablet Take 1 tablet (500 mg total) by mouth 4 (four) times daily. As needed for muscle spasm 30 tablet 1  . omeprazole (PRILOSEC) 40 MG capsule Take 40 mg by mouth daily. Reported on 01/19/2016    . ondansetron (ZOFRAN) 8 MG tablet Take 8 mg by mouth 2 (two) times daily as needed for nausea or vomiting.    . rosuvastatin (CRESTOR) 20 MG tablet Take 20 mg by mouth daily with breakfast.      No facility-administered medications prior to visit.      Allergies:   Tape   Social History   Social History  . Marital status: Married    Spouse name: N/A  . Number of children: N/A  . Years of education: N/A   Social History Main Topics   . Smoking status: Never Smoker  . Smokeless tobacco: Never Used  . Alcohol use No  . Drug use: No  . Sexual activity: Yes    Birth control/ protection: Spermicide   Other Topics Concern  . None   Social History Narrative   Epworth Sleepiness Scale Score:  15      --I have HTN   --I have had Insomnia   --I feel stressed and lack motivation   --I have Diabetes   --I am overweight or am gaining weight   --I awake feeling not rested        Family History:  The patient's family history includes Breast cancer in her maternal aunt; Cancer in her father, paternal aunt, and paternal aunt; Diabetes in her mother; Heart disease in her father and mother.   ROS:   Please see the history of present illness.    ROS All other systems reviewed  and are negative.   PHYSICAL EXAM:   VS:  BP (!) 156/72   Pulse 84   Ht 5\' 6"  (1.676 m)   Wt 270 lb (122.5 kg)   BMI 43.58 kg/m     General: Alert, oriented x3, no distress Head: no evidence of trauma, PERRL, EOMI, no exophtalmos or lid lag, no myxedema, no xanthelasma; normal ears, nose and oropharynx Neck: normal jugular venous pulsations and no hepatojugular reflux; brisk carotid pulses without delay and no carotid bruits Chest: clear to auscultation, no signs of consolidation by percussion or palpation, normal fremitus, symmetrical and full respiratory excursions Cardiovascular: normal position and quality of the apical impulse, regular rhythm, normal first and second heart sounds, no murmurs, rubs or gallops Abdomen: no tenderness or distention, no masses by palpation, no abnormal pulsatility or arterial bruits, normal bowel sounds, no hepatosplenomegaly Extremities: no clubbing, cyanosis or edema; 2+ radial, ulnar and brachial pulses bilaterally; 2+ right femoral, posterior tibial and dorsalis pedis pulses; 2+ left femoral, posterior tibial and dorsalis pedis pulses; no subclavian or femoral bruits Neurological: grossly nonfocal  Psych:  euthymic mood, full affect  Wt Readings from Last 3 Encounters:  05/19/17 270 lb (122.5 kg)  05/12/17 266 lb (120.7 kg)  04/14/17 266 lb 14.4 oz (121.1 kg)      Studies/Labs Reviewed:   EKG:  EKG is ordered today.  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  Recent Labs: 04/13/2017: B Natriuretic Peptide 36.2 04/15/2017: ALT 25; BUN 36; Creatinine, Ser 1.30; Hemoglobin 11.5; Platelets 221; Potassium 3.8; Sodium 139   Lipid PanelBefore treatment    Component Value Date/Time   CHOL 225 (H) 07/15/2011 0653   TRIG 186 (H) 07/15/2011 0653   HDL 30 (L) 07/15/2011 0653   CHOLHDL 7.5 07/15/2011 0653   VLDL 37 07/15/2011 0653   LDLCALC 158 (H) 07/15/2011 0653   May 2018 Total cholesterol 135, HDL 33, LDL 83, triglycerides 97 Hemoglobin A1c 8.4% TSH 2.04 Potassium 3.9, creatinine 0.96, hemoglobin 12.5, normal LFTs  ASSESSMENT:    1. Pulmonary artery hypertension (HCC)   2. Bilateral leg edema   3. 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 OSA (obstructive sleep apnea)   5. Essential hypertension   6. Morbid obesity ([place])   7. Insulin dependent diabetes mellitus ([place])   8. Dyslipidemia   9. Medication management      PLAN:  In order of problems listed above:  1. Pulmonary artery hypertension:  likely to be multifactorial with an important contribution of morbid obesity, possibly diastolic left heart failure, but primarily right heart dysfunction due to obstructive sleep apnea. Although the echo did show some diastolic dysfunction, there was no evidence of elevated left atrial filling pressure. The focus should be on weight loss and treatment of sleep apnea.  2. Edema:  well-controlled. It's possible that diuretic therapy has led to hyperuricemia and gout. Renal parameters in June showed worsened BUN and creatinine compared with May. Check uric acid levels and recheck renal  parameters today. I believe we'll need to decrease the dose of furosemide, but will wait until tomorrow's lab results. 3. Arthritis: Has some features suggesting acute gout in her right great toe and left ankle.  Will check uric acid. She is seeing her orthopedist tomorrow. Avoid nonsteroidal anti-inflammatory drugs due to their effect on blood pressure and volume retention. Can use colchicine or brief course of steroids. 4. OSA:  CPAP monitored by Dr. Claiborne Billings 5. HTN: Her blood pressure control is worse, possibly related to use of generic ARB. Increase the dose to 40 mg daily. 6. Obesity:  No real progress with weight loss since last year 7. DM: Most recent hemoglobin A1c 8.4%, improved from last November but still not at target. 8. HLP: All her lipid parameters are within the desirable range based on labs performed on 03/25/2017.   Medication Adjustments/Labs and Tests Ordered: Current medicines are reviewed at length with the patient today.  Concerns regarding medicines are outlined above.  Medication changes, Labs and Tests ordered today are listed in the Patient Instructions below. Patient Instructions  Medication Instructions: Dr Sallyanne Kuster has recommended making the following medication changes: 1. INCREASE Olmesartan to 40 mg daily  Labwork: Your physician recommends that you return for lab work TODAY.  Testing/Procedures: NONE ORDERED  Follow-up: Dr Sallyanne Kuster recommends that you schedule a follow-up appointment in 12 months. You will receive a reminder letter in the mail two months in advance. If you don't receive a letter, please call our office to schedule the follow-up appointment.  If you need a refill on your cardiac medications before your next appointment, please call your pharmacy.      Signed, Sanda Klein, MD  05/19/2017 7:01 PM    [facility] [place], [place],   [address] Phone: [phone]; Fax: [phone]

## 2017-05-20 ENCOUNTER — Telehealth: Payer: Self-pay | Admitting: Cardiovascular Disease

## 2017-05-20 DIAGNOSIS — M25572 Pain in left ankle and joints of left foot: Secondary | ICD-10-CM | POA: Diagnosis not present

## 2017-05-20 LAB — BASIC METABOLIC PANEL
BUN/Creatinine Ratio: 15 (ref 12–28)
BUN: 13 mg/dL (ref 8–27)
CALCIUM: 9.3 mg/dL (ref 8.7–10.3)
CO2: 27 mmol/L (ref 20–29)
CREATININE: 0.85 mg/dL (ref 0.57–1.00)
Chloride: 97 mmol/L (ref 96–106)
GFR calc Af Amer: 82 mL/min/{1.73_m2} (ref >59)
GFR, EST NON AFRICAN AMERICAN: 71 mL/min/{1.73_m2} (ref >59)
Glucose: 65 mg/dL (ref 65–99)
Potassium: 4 mmol/L (ref 3.5–5.2)
Sodium: 142 mmol/L (ref 134–144)

## 2017-05-20 LAB — URIC ACID: URIC ACID: 5.5 mg/dL (ref 2.5–7.1)

## 2017-05-20 NOTE — Telephone Encounter (Signed)
Returned the call to the patient. She has been informed of her results.  Uric Acid 2.5 - 7.1 mg/dL 5.5    Per Dr. Sallyanne Kuster she should keep her appointment today with her physician for further assessment of her pain.

## 2017-05-20 NOTE — Telephone Encounter (Signed)
New Message      Pt needs to know blood work results before 230p today , so she knows if she needs to see the other doctor

## 2017-05-23 ENCOUNTER — Encounter (HOSPITAL_COMMUNITY): Payer: Self-pay

## 2017-05-23 ENCOUNTER — Emergency Department (HOSPITAL_COMMUNITY)
Admission: EM | Admit: 2017-05-23 | Discharge: 2017-05-23 | Disposition: A | Discharge status: Home / Self Care | Payer: 59 | Attending: Emergency Medicine | Admitting: Emergency Medicine

## 2017-05-23 DIAGNOSIS — Z794 Long term (current) use of insulin: Secondary | ICD-10-CM | POA: Diagnosis not present

## 2017-05-23 DIAGNOSIS — Z7982 Long term (current) use of aspirin: Secondary | ICD-10-CM | POA: Insufficient documentation

## 2017-05-23 DIAGNOSIS — R2242 Localized swelling, mass and lump, left lower limb: Secondary | ICD-10-CM | POA: Diagnosis present

## 2017-05-23 DIAGNOSIS — I10 Essential (primary) hypertension: Secondary | ICD-10-CM | POA: Insufficient documentation

## 2017-05-23 DIAGNOSIS — E119 Type 2 diabetes mellitus without complications: Secondary | ICD-10-CM | POA: Diagnosis not present

## 2017-05-23 DIAGNOSIS — M10072 Idiopathic gout, left ankle and foot: Secondary | ICD-10-CM

## 2017-05-23 LAB — CBG MONITORING, ED: GLUCOSE-CAPILLARY: 72 mg/dL (ref 65–99)

## 2017-05-23 MED ORDER — PREDNISONE 20 MG PO TABS: ORAL_TABLET | ORAL | 0 refills | Status: DC

## 2017-05-23 MED ORDER — PREDNISONE 20 MG PO TABS
60.0000 mg | ORAL_TABLET | Freq: Once | ORAL | Status: AC
  Administered 2017-05-23: 60 mg via ORAL
  Filled 2017-05-23: qty 3

## 2017-05-23 NOTE — Discharge Instructions (Signed)
Start taking prednisone tomorrow (you were given a dose today).   Continue tylenol, motrin for mild pain.   Take vicodin as prescribed by your doctor for severe pain.   Keep leg elevated.   Prednisone may increase your blood sugar. You may need to give yourself more novolog. Monitor your sugar more frequently.   Follow up with Dr. Maureen Ralphs and your primary care doctor   Return to ER if you have worse foot pain, foot swelling, fever.

## 2017-05-23 NOTE — ED Provider Notes (Signed)
[facility] DEPT Provider Note   CSN: [id number] Arrival date & time: 05/23/17  1341     History   Chief Complaint Chief Complaint  Sue Davidson presents with  . Foot Swelling    HPI Sue Davidson Sue Davidson Davidson is a 66 y.o. female history of Sue Davidson Davidson, Sue Davidson Sue Davidson Davidson, Sue Davidson Sue Davidson Davidson, Sue Davidson Sue Davidson Davidson left big toe pain. Sue Davidson saw a cardiologist about 5 days ago and had normal chemistry as well as uric acid 5.5. Sue Davidson then saw Dr. Sandie Ano 3 days ago and prescribed hydrocodone. He wanted her to contact her endocrinologist prior to prescribing colchicine or steroids. Sue Davidson states that her endocrinologist is out of town this week. Denies fevers. Has pain with bearing weight on the foot.   The history is provided by the Sue Davidson.    Past Medical History:  Diagnosis Date  . Anemia    hx of  . Anginal pain (HCC)    hx of  . Anxiety   . Depression   . Sue Davidson Davidson mellitus without complication ([place])   . GERD (gastroesophageal reflux disease)   . Headache(784.0)   . Heart murmur   . Sue Davidson   . Sue Davidson Sue Davidson Davidson   . MRSA (methicillin resistant staph aureus) culture positive    many years ago-abdominal wound- no issuses since.  Sue Davidson Sue Davidson Davidson PONV (postoperative nausea and vomiting)    severe  . Sleep apnea    Study done -remains under evaluation- no cpap yet.  . Vitamin D deficiency     Sue Davidson Active Problem List   Diagnosis Date Noted  . Chest pain 04/14/2017  . AKI (acute kidney injury) ([place]) 04/14/2017  . Hyponatremia 04/14/2017  . Carotid disease, bilateral ([place]) 10/06/2016  . Normal coronary arteries 10/06/2016  . Lateral meniscal tear 06/29/2016  . Pulmonary artery Sue Davidson Sue Davidson Davidson ([place]) 04/23/2016  . Exertional dyspnea 01/26/2016  . Bilateral leg edema 01/26/2016  . Morbid obesity ([place]) 01/26/2016  . Dyslipidemia 01/26/2016  . Chronic cholecystitis 10/25/2013  . DYSPNEA  06/05/2009  . Insulin dependent Sue Davidson Davidson mellitus ([place]) 06/04/2009  . OSA (obstructive sleep apnea) 06/04/2009  . Essential Sue Davidson Sue Davidson Davidson 06/04/2009  . EDEMA LEG 06/04/2009    Past Surgical History:  Procedure Laterality Date  . ABDOMINAL HYSTERECTOMY  1970's  . APPENDECTOMY  1970's  . BREAST BIOPSY  02/09/1996  . BREAST EXCISIONAL BIOPSY Left 2000   no visible scar  . CARDIAC CATHETERIZATION    . CHOLECYSTECTOMY N/A 10/02/2013   Procedure: LAPAROSCOPIC CHOLECYSTECTOMY;  Surgeon: Earnstine Regal, MD;  Location: [facility];  Service: General;  Laterality: N/A;  . cyst removed Left    wrist"ganglion"  . ELBOW SURGERY Left   . HARDWARE REMOVAL Left    ankle  . KNEE ARTHROSCOPY Right 06/30/2016   Procedure: ARTHROSCOPY RIGHT KNEE WITH MEDIAL AND LATERAL MENSICAL DEBRIDEMENT;  Surgeon: Gaynelle Arabian, MD;  Location: [facility];  Service: Orthopedics;  Laterality: Right;  LMA  . LEG SURGERY Left 1980's   broke leg and ankle  . SHOULDER ARTHROSCOPY Bilateral   . TONSILLECTOMY  1970's  . TUBAL LIGATION  1970's    OB History    No data available       Home Medications    Prior to Admission medications   Medication Sig Start Date End Date Taking? Authorizing Provider  acetaminophen (TYLENOL) 500 MG tablet Take 500 mg by mouth every 6 (six) hours as needed for moderate pain.   Yes [provider]  aspirin 81 MG tablet Take 81 mg by mouth daily.   Yes [provider]  Biotin 10000 MCG TABS Take 1 tablet by mouth daily.   Yes [provider]  cholecalciferol (VITAMIN D) 1000 units tablet Take 1,000 Units by mouth daily.   Yes [provider]  HUMALOG MIX 75/25 (75-25) 100 UNIT/ML SUSP injection Inject into the skin 3 (three) times daily with meals. 70 units every morning. 15-25 units at lunch on a sliding scale depending on blood sugar. 85 units with supper. 08/15/13  Yes [provider]  levothyroxine (SYNTHROID, LEVOTHROID) 75 MCG tablet Take 75 mcg  by mouth daily before breakfast.   Yes [provider]  olmesartan (BENICAR) 40 MG tablet Take 1 tablet (40 mg total) by mouth daily. 05/19/17  Yes Croitoru, Mihai, MD  omeprazole (PRILOSEC) 40 MG capsule Take 40 mg by mouth daily. Reported on 01/19/2016 07/18/13  Yes [provider]  ondansetron (ZOFRAN) 8 MG tablet Take 8 mg by mouth 2 (two) times daily as needed for nausea or vomiting.   Yes [provider]  rosuvastatin (CRESTOR) 20 MG tablet Take 20 mg by mouth daily with breakfast.    Yes [provider]  furosemide (LASIX) 40 MG tablet Take 1 tablet (40 mg total) by mouth daily. Alternates taking 40mg  then 80mg . Sue Davidson taking differently: Take 40 mg by mouth daily.  04/18/17   Reyne Dumas, MD  HYDROcodone-acetaminophen (NORCO) 5-325 MG tablet Take 1-2 tablets by mouth every 4 (four) hours as needed for moderate pain. Sue Davidson not taking: Reported on 05/23/2017 06/30/16   Gaynelle Arabian, MD  methocarbamol (ROBAXIN) 500 MG tablet Take 1 tablet (500 mg total) by mouth 4 (four) times daily. As needed for muscle spasm Sue Davidson not taking: Reported on 05/23/2017 06/30/16   Gaynelle Arabian, MD    Family History Family History  Problem Relation Age of Onset  . Sue Davidson Davidson Mother   . Heart disease Mother        CABG age 17s  . Cancer Father        throat and lung  . Heart disease Father   . Cancer Paternal Aunt        breast  . Cancer Paternal Aunt        colon  . Breast cancer Maternal Aunt     Social History Social History  Substance Use Topics  . Smoking status: Never Smoker  . Smokeless tobacco: Never Used  . Alcohol use No     Allergies   Tape   Review of Systems Review of Systems  Musculoskeletal:       L foot pain   All other systems reviewed and are negative.    Physical Exam Updated Vital Signs BP (!) 176/60   Pulse 73   Temp 98.5 F (36.9 C) (Oral)   Resp 16   SpO2 98%   Physical Exam  Constitutional: She is oriented to  person, place, and time.  Slightly uncomfortable   HENT:  Head: Normocephalic.  Eyes: Pupils are equal, round, and reactive to light.  Neck: Normal range of motion.  Cardiovascular: Normal rate.   Pulmonary/Chest: Effort normal.  Abdominal: Soft.  Musculoskeletal:  L MTP joint swollen and tender. Dec ROM of the joint due to pain. No obvious cellulitis. Entire foot swollen as well. 2+ pulses   Neurological: She is alert and oriented to person, place, and time.  Skin: Skin is warm.  Psychiatric: She has a normal mood  and affect.  Nursing note and vitals reviewed.    ED Treatments / Results  Labs (all labs ordered are listed, but only abnormal results are displayed) Labs Reviewed  CBG MONITORING, ED    EKG  EKG Interpretation None       Radiology No results found.  Procedures Procedures (including critical care time)  Medications Ordered in ED Medications - No data to display   Initial Impression / Assessment and Plan / ED Course  I have reviewed the triage vital signs and the nursing notes.  Pertinent labs & imaging results that were available during my care of the Sue Davidson were reviewed by me and considered in my medical decision making (see chart for details).     Corneisha Alvi is a 66 y.o. female here with L big toe pain. Seen cardiology and Dr. Maureen Ralphs, ortho, and both think that Sue Davidson has gout. I think likely gout despite nl uric acid levels. Chemistry unremarkable several days ago. CBG nl. I doubt septic joint. She is on novolog at home. I think its safe to give steroids. If her sugar is elevated, she has a sliding scale she can use at home. She has hydrocodone prescribed by Dr. Maureen Ralphs. Will dc home.    Final Clinical Impressions(s) / ED Diagnoses   Final diagnoses:  None    New Prescriptions New Prescriptions   No medications on file     Drenda Freeze, MD 05/23/17 1727

## 2017-05-23 NOTE — ED Triage Notes (Signed)
Pt reports that her L foot has been swelling and painful for the past few weeks. She has seen her PCP and been dx'd with gout, but pt is diabetic and they did not want to give her medication without consulting her endocrinologist (who is on vacation). Pt c/o 10/10 pain in the L foot and states that it is affecting her ambulation. A&Ox4.

## 2017-05-27 DIAGNOSIS — M109 Gout, unspecified: Secondary | ICD-10-CM | POA: Diagnosis not present

## 2017-05-27 DIAGNOSIS — I10 Essential (primary) hypertension: Secondary | ICD-10-CM | POA: Diagnosis not present

## 2017-06-03 DIAGNOSIS — I10 Essential (primary) hypertension: Secondary | ICD-10-CM | POA: Diagnosis not present

## 2017-06-03 DIAGNOSIS — L03115 Cellulitis of right lower limb: Secondary | ICD-10-CM | POA: Diagnosis not present

## 2017-06-03 DIAGNOSIS — M109 Gout, unspecified: Secondary | ICD-10-CM | POA: Diagnosis not present

## 2017-06-05 DIAGNOSIS — G4733 Obstructive sleep apnea (adult) (pediatric): Secondary | ICD-10-CM | POA: Diagnosis not present

## 2017-06-10 DIAGNOSIS — M79674 Pain in right toe(s): Secondary | ICD-10-CM | POA: Diagnosis not present

## 2017-06-10 DIAGNOSIS — M7989 Other specified soft tissue disorders: Secondary | ICD-10-CM | POA: Diagnosis not present

## 2017-06-29 DIAGNOSIS — M79672 Pain in left foot: Secondary | ICD-10-CM | POA: Diagnosis not present

## 2017-06-29 DIAGNOSIS — M79671 Pain in right foot: Secondary | ICD-10-CM | POA: Diagnosis not present

## 2017-06-29 DIAGNOSIS — M7989 Other specified soft tissue disorders: Secondary | ICD-10-CM | POA: Diagnosis not present

## 2017-07-06 DIAGNOSIS — G4733 Obstructive sleep apnea (adult) (pediatric): Secondary | ICD-10-CM | POA: Diagnosis not present

## 2017-07-14 DIAGNOSIS — M109 Gout, unspecified: Secondary | ICD-10-CM | POA: Diagnosis not present

## 2017-07-18 DIAGNOSIS — Z9109 Other allergy status, other than to drugs and biological substances: Secondary | ICD-10-CM | POA: Diagnosis not present

## 2017-07-22 DIAGNOSIS — Z23 Encounter for immunization: Secondary | ICD-10-CM | POA: Diagnosis not present

## 2017-07-27 DIAGNOSIS — M109 Gout, unspecified: Secondary | ICD-10-CM | POA: Diagnosis not present

## 2017-07-27 DIAGNOSIS — M79672 Pain in left foot: Secondary | ICD-10-CM | POA: Diagnosis not present

## 2017-08-04 DIAGNOSIS — S76312A Strain of muscle, fascia and tendon of the posterior muscle group at thigh level, left thigh, initial encounter: Secondary | ICD-10-CM | POA: Diagnosis not present

## 2017-08-26 ENCOUNTER — Other Ambulatory Visit: Payer: Self-pay | Admitting: Internal Medicine

## 2017-08-26 DIAGNOSIS — N6489 Other specified disorders of breast: Secondary | ICD-10-CM

## 2017-09-12 DIAGNOSIS — S76312D Strain of muscle, fascia and tendon of the posterior muscle group at thigh level, left thigh, subsequent encounter: Secondary | ICD-10-CM | POA: Diagnosis not present

## 2017-09-14 DIAGNOSIS — S76312D Strain of muscle, fascia and tendon of the posterior muscle group at thigh level, left thigh, subsequent encounter: Secondary | ICD-10-CM | POA: Diagnosis not present

## 2017-09-19 DIAGNOSIS — S76312D Strain of muscle, fascia and tendon of the posterior muscle group at thigh level, left thigh, subsequent encounter: Secondary | ICD-10-CM | POA: Diagnosis not present

## 2017-09-20 DIAGNOSIS — E119 Type 2 diabetes mellitus without complications: Secondary | ICD-10-CM | POA: Diagnosis not present

## 2017-09-20 DIAGNOSIS — M109 Gout, unspecified: Secondary | ICD-10-CM | POA: Diagnosis not present

## 2017-09-20 DIAGNOSIS — E7800 Pure hypercholesterolemia, unspecified: Secondary | ICD-10-CM | POA: Diagnosis not present

## 2017-09-23 DIAGNOSIS — M25572 Pain in left ankle and joints of left foot: Secondary | ICD-10-CM | POA: Diagnosis not present

## 2017-09-23 DIAGNOSIS — M109 Gout, unspecified: Secondary | ICD-10-CM | POA: Diagnosis not present

## 2017-09-23 DIAGNOSIS — M79672 Pain in left foot: Secondary | ICD-10-CM | POA: Diagnosis not present

## 2017-10-04 DIAGNOSIS — S76312D Strain of muscle, fascia and tendon of the posterior muscle group at thigh level, left thigh, subsequent encounter: Secondary | ICD-10-CM | POA: Diagnosis not present

## 2017-10-05 ENCOUNTER — Ambulatory Visit
Admission: RE | Admit: 2017-10-05 | Discharge: 2017-10-05 | Disposition: A | Discharge status: Home / Self Care | Payer: 59 | Source: Ambulatory Visit | Attending: Internal Medicine | Admitting: Internal Medicine

## 2017-10-05 DIAGNOSIS — R928 Other abnormal and inconclusive findings on diagnostic imaging of breast: Secondary | ICD-10-CM | POA: Diagnosis not present

## 2017-10-05 DIAGNOSIS — N6489 Other specified disorders of breast: Secondary | ICD-10-CM

## 2017-10-06 DIAGNOSIS — S76312D Strain of muscle, fascia and tendon of the posterior muscle group at thigh level, left thigh, subsequent encounter: Secondary | ICD-10-CM | POA: Diagnosis not present

## 2017-10-10 DIAGNOSIS — S76312D Strain of muscle, fascia and tendon of the posterior muscle group at thigh level, left thigh, subsequent encounter: Secondary | ICD-10-CM | POA: Diagnosis not present

## 2017-10-12 DIAGNOSIS — S76312D Strain of muscle, fascia and tendon of the posterior muscle group at thigh level, left thigh, subsequent encounter: Secondary | ICD-10-CM | POA: Diagnosis not present

## 2017-10-21 DIAGNOSIS — S76312D Strain of muscle, fascia and tendon of the posterior muscle group at thigh level, left thigh, subsequent encounter: Secondary | ICD-10-CM | POA: Diagnosis not present

## 2017-10-28 DIAGNOSIS — S76312D Strain of muscle, fascia and tendon of the posterior muscle group at thigh level, left thigh, subsequent encounter: Secondary | ICD-10-CM | POA: Diagnosis not present

## 2017-11-17 DIAGNOSIS — M25562 Pain in left knee: Secondary | ICD-10-CM | POA: Diagnosis not present

## 2017-11-23 DIAGNOSIS — Z8614 Personal history of Methicillin resistant Staphylococcus aureus infection: Secondary | ICD-10-CM | POA: Diagnosis not present

## 2017-11-23 DIAGNOSIS — L0291 Cutaneous abscess, unspecified: Secondary | ICD-10-CM | POA: Diagnosis not present

## 2017-11-28 DIAGNOSIS — M79641 Pain in right hand: Secondary | ICD-10-CM | POA: Diagnosis not present

## 2017-11-28 DIAGNOSIS — M65341 Trigger finger, right ring finger: Secondary | ICD-10-CM | POA: Diagnosis not present

## 2017-11-28 DIAGNOSIS — G5601 Carpal tunnel syndrome, right upper limb: Secondary | ICD-10-CM | POA: Diagnosis not present

## 2017-11-28 DIAGNOSIS — G5600 Carpal tunnel syndrome, unspecified upper limb: Secondary | ICD-10-CM | POA: Insufficient documentation

## 2017-12-02 DIAGNOSIS — L723 Sebaceous cyst: Secondary | ICD-10-CM | POA: Diagnosis not present

## 2017-12-08 DIAGNOSIS — L720 Epidermal cyst: Secondary | ICD-10-CM | POA: Diagnosis not present

## 2017-12-19 DIAGNOSIS — M65341 Trigger finger, right ring finger: Secondary | ICD-10-CM | POA: Diagnosis not present

## 2017-12-19 DIAGNOSIS — M79641 Pain in right hand: Secondary | ICD-10-CM | POA: Diagnosis not present

## 2017-12-19 DIAGNOSIS — G5601 Carpal tunnel syndrome, right upper limb: Secondary | ICD-10-CM | POA: Diagnosis not present

## 2018-01-03 DIAGNOSIS — M23252 Derangement of posterior horn of lateral meniscus due to old tear or injury, left knee: Secondary | ICD-10-CM | POA: Diagnosis not present

## 2018-01-03 DIAGNOSIS — M94262 Chondromalacia, left knee: Secondary | ICD-10-CM | POA: Diagnosis not present

## 2018-01-03 DIAGNOSIS — M948X6 Other specified disorders of cartilage, lower leg: Secondary | ICD-10-CM | POA: Diagnosis not present

## 2018-01-03 DIAGNOSIS — G8918 Other acute postprocedural pain: Secondary | ICD-10-CM | POA: Diagnosis not present

## 2018-01-03 DIAGNOSIS — M23362 Other meniscus derangements, other lateral meniscus, left knee: Secondary | ICD-10-CM | POA: Diagnosis not present

## 2018-01-03 DIAGNOSIS — M23302 Other meniscus derangements, unspecified lateral meniscus, unspecified knee: Secondary | ICD-10-CM | POA: Diagnosis not present

## 2018-01-13 IMAGING — MG MM BREAST BX W LOC DEV 1ST LESION IMAGE BX SPEC STEREO GUIDE*L*
3 series · 3 of 15 positions shown · non-contrast
Comparison: none

[L CC tomo (1 of 3) · tomo slice 29/58.0]
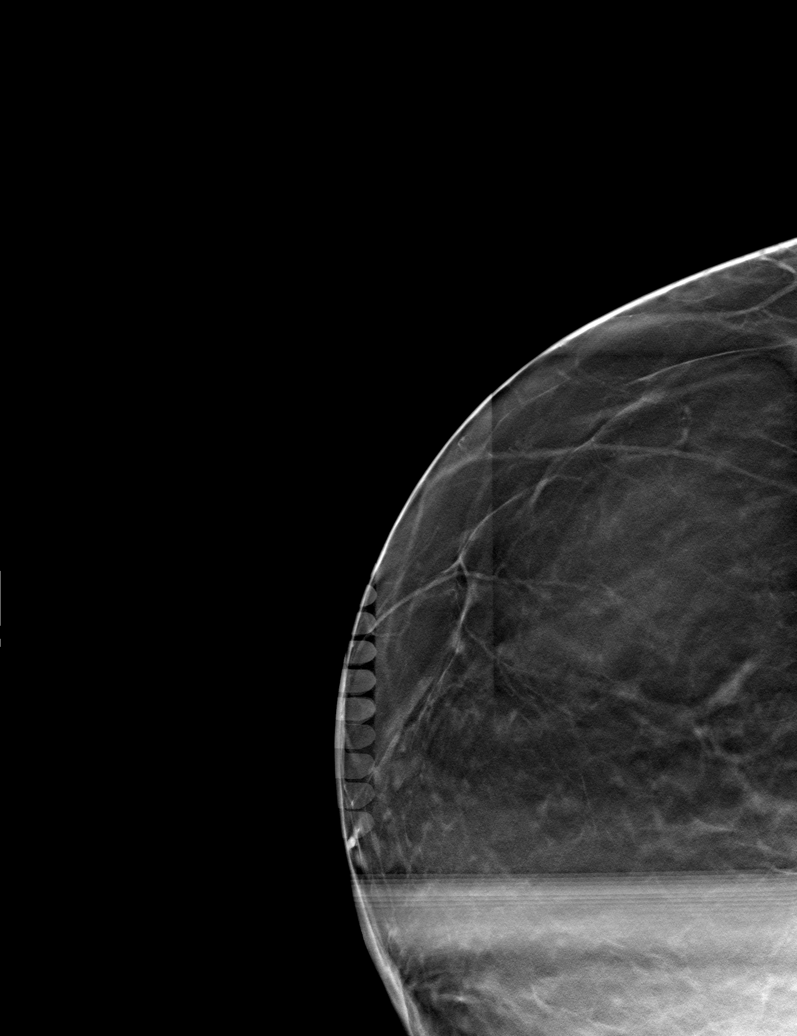

[L CC tomo (2 of 3) · tomo slice 31/62.0]
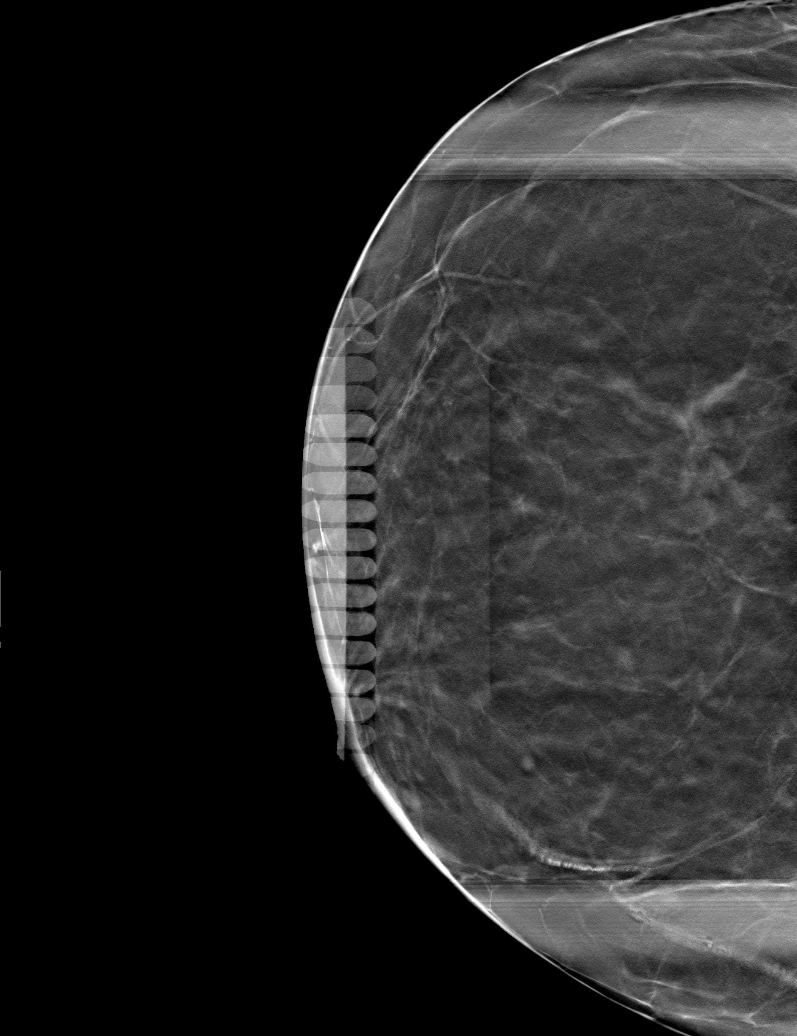

[L CC tomo (3 of 3) · tomo slice 34/67.0]
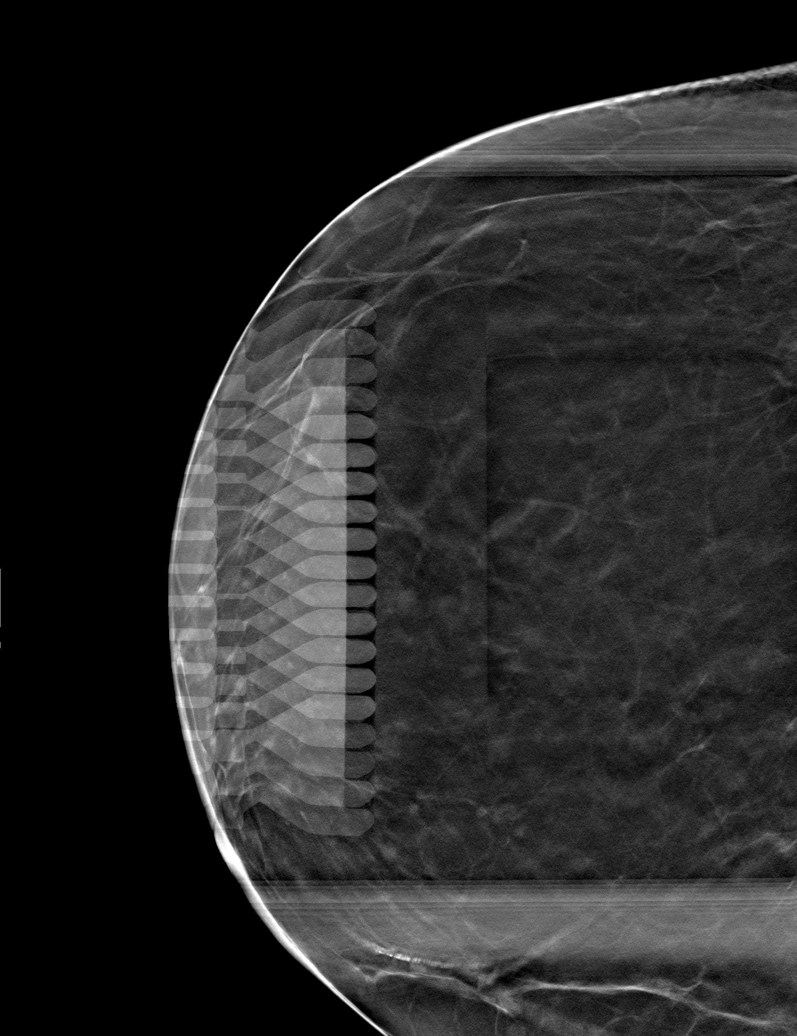

[3 of 15 positions shown; findings below may reference images not displayed]

:
The patient presented today for the stereotactic biopsy of the
questioned distortion in the medial left breast. In preparation for
the biopsy, several images of the medial left breast were obtained.
No distortion could be identified for sampling. The tissue appears
normal without distortion, mass or abnormal calcifications.

I discussed this with the patient and told her that there is no
abnormality amenable to biopsy, and therefore I recommend that the
biopsy be canceled and the visit today will be at no charge to the
patient. A follow-up diagnostic left breast mammogram should be
performed in 6 months as further confirmation of the resolution of
abnormality of concern.

## 2018-01-19 IMAGING — DX DG CHEST 2V
2 series · 2 of 2 positions shown · non-contrast
Comparison: 01/12/2017

CLINICAL DATA: Pain across the lower chest and diaphragm for 1
month

EXAM:
CHEST  2 VIEW

[chest pa]
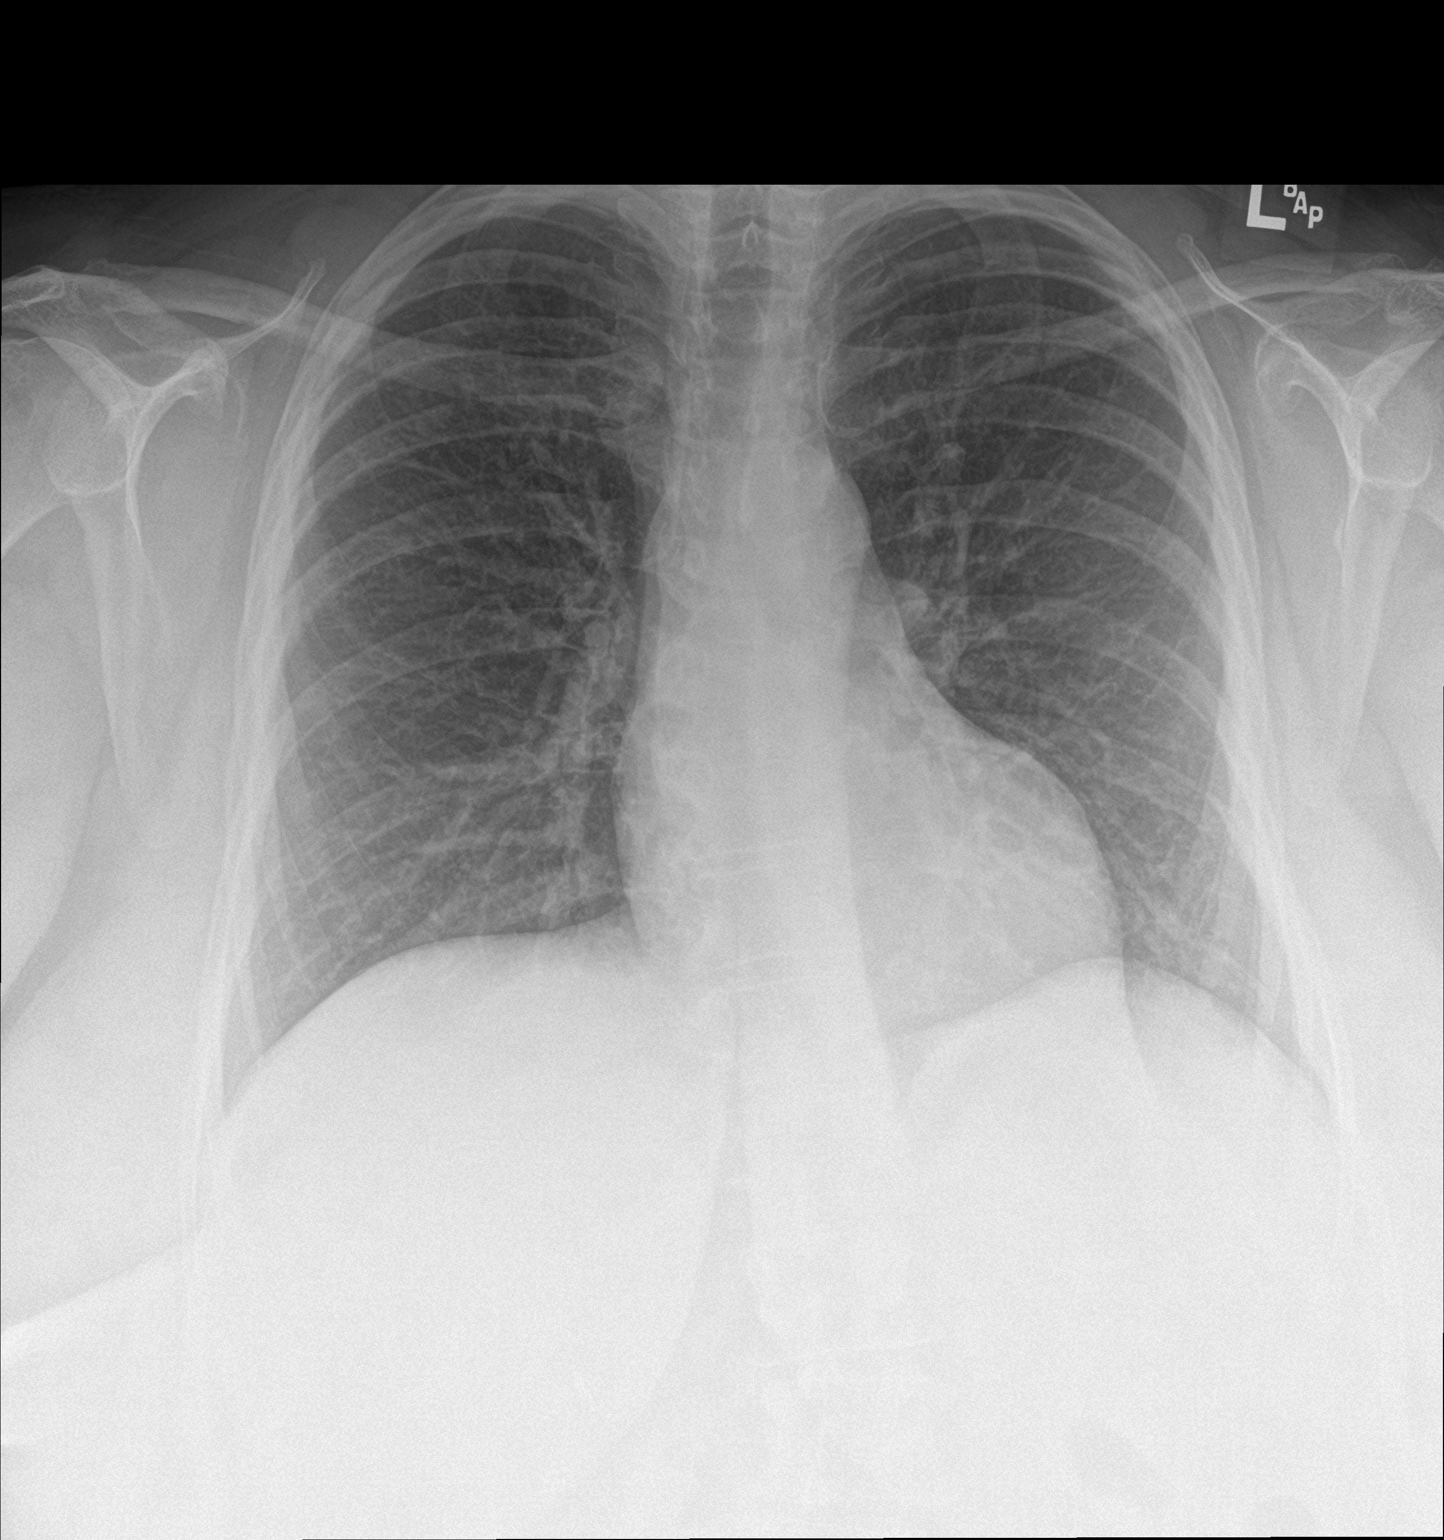

[chest lat]
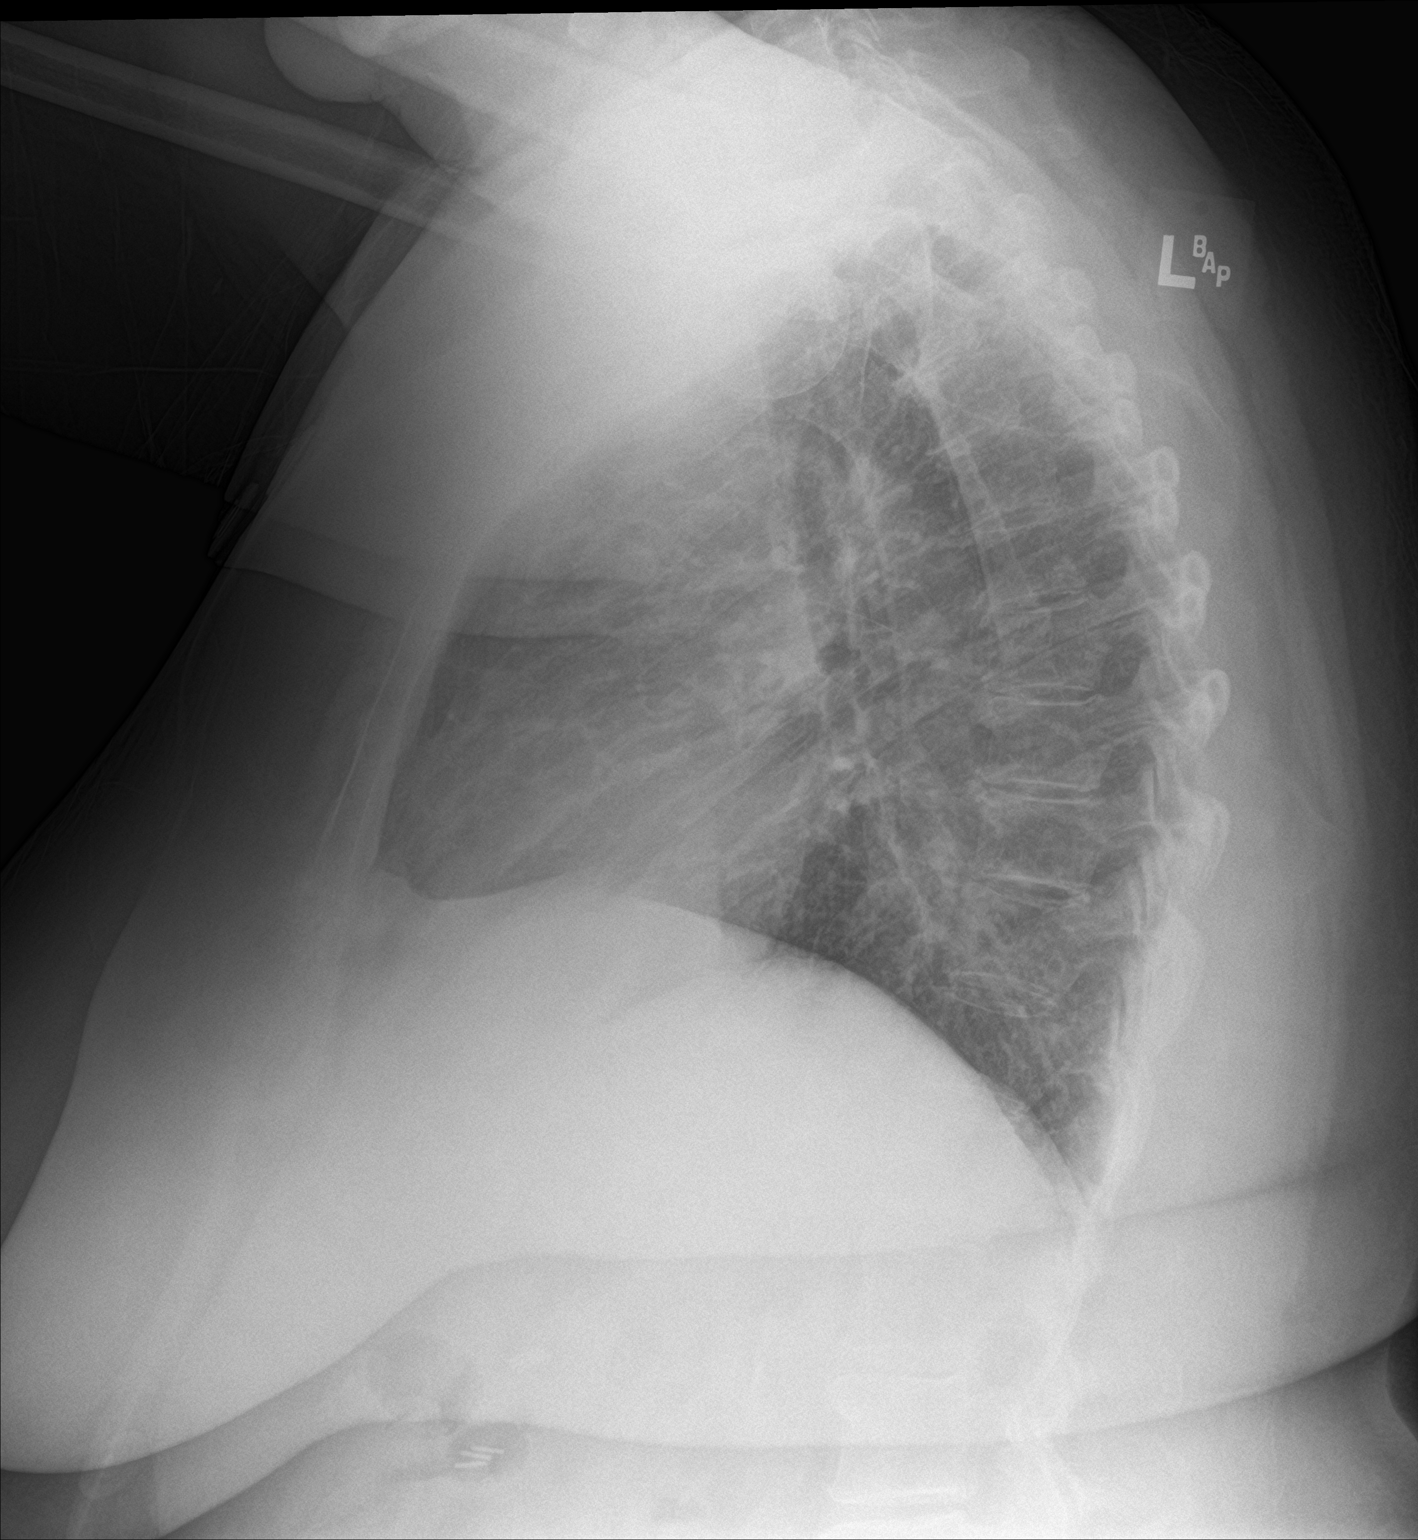

[2 of 2 positions shown; findings below may reference images not displayed]

FINDINGS: The heart size and mediastinal contours are within normal limits.
Both lungs are clear. The visualized skeletal structures are
unremarkable. Surgical clips in the right upper quadrant
IMPRESSION: No active cardiopulmonary disease.

## 2018-01-26 DIAGNOSIS — J019 Acute sinusitis, unspecified: Secondary | ICD-10-CM | POA: Diagnosis not present

## 2018-02-17 ENCOUNTER — Other Ambulatory Visit: Payer: Self-pay | Admitting: Internal Medicine

## 2018-02-17 DIAGNOSIS — Z1231 Encounter for screening mammogram for malignant neoplasm of breast: Secondary | ICD-10-CM

## 2018-03-20 DIAGNOSIS — R197 Diarrhea, unspecified: Secondary | ICD-10-CM | POA: Diagnosis not present

## 2018-03-20 DIAGNOSIS — K5730 Diverticulosis of large intestine without perforation or abscess without bleeding: Secondary | ICD-10-CM | POA: Diagnosis not present

## 2018-03-20 DIAGNOSIS — D126 Benign neoplasm of colon, unspecified: Secondary | ICD-10-CM | POA: Diagnosis not present

## 2018-03-27 ENCOUNTER — Ambulatory Visit
Admission: RE | Admit: 2018-03-27 | Discharge: 2018-03-27 | Disposition: A | Discharge status: Home / Self Care | Payer: 59 | Source: Ambulatory Visit | Attending: Internal Medicine | Admitting: Internal Medicine

## 2018-03-27 DIAGNOSIS — Z1231 Encounter for screening mammogram for malignant neoplasm of breast: Secondary | ICD-10-CM | POA: Diagnosis not present

## 2018-03-29 DIAGNOSIS — I10 Essential (primary) hypertension: Secondary | ICD-10-CM | POA: Diagnosis not present

## 2018-03-29 DIAGNOSIS — E7800 Pure hypercholesterolemia, unspecified: Secondary | ICD-10-CM | POA: Diagnosis not present

## 2018-03-29 DIAGNOSIS — E1165 Type 2 diabetes mellitus with hyperglycemia: Secondary | ICD-10-CM | POA: Diagnosis not present

## 2018-03-29 DIAGNOSIS — E559 Vitamin D deficiency, unspecified: Secondary | ICD-10-CM | POA: Diagnosis not present

## 2018-04-05 DIAGNOSIS — Z0000 Encounter for general adult medical examination without abnormal findings: Secondary | ICD-10-CM | POA: Diagnosis not present

## 2018-05-22 DIAGNOSIS — E1165 Type 2 diabetes mellitus with hyperglycemia: Secondary | ICD-10-CM | POA: Diagnosis not present

## 2018-05-22 DIAGNOSIS — E039 Hypothyroidism, unspecified: Secondary | ICD-10-CM | POA: Diagnosis not present

## 2018-05-22 DIAGNOSIS — E7800 Pure hypercholesterolemia, unspecified: Secondary | ICD-10-CM | POA: Diagnosis not present

## 2018-06-12 DIAGNOSIS — H43813 Vitreous degeneration, bilateral: Secondary | ICD-10-CM | POA: Diagnosis not present

## 2018-06-12 DIAGNOSIS — H4312 Vitreous hemorrhage, left eye: Secondary | ICD-10-CM | POA: Diagnosis not present

## 2018-06-12 DIAGNOSIS — Z961 Presence of intraocular lens: Secondary | ICD-10-CM | POA: Diagnosis not present

## 2018-06-21 DIAGNOSIS — H43812 Vitreous degeneration, left eye: Secondary | ICD-10-CM | POA: Diagnosis not present

## 2018-06-21 DIAGNOSIS — H35372 Puckering of macula, left eye: Secondary | ICD-10-CM | POA: Diagnosis not present

## 2018-06-21 DIAGNOSIS — E113293 Type 2 diabetes mellitus with mild nonproliferative diabetic retinopathy without macular edema, bilateral: Secondary | ICD-10-CM | POA: Diagnosis not present

## 2018-07-03 ENCOUNTER — Ambulatory Visit: Payer: 59 | Admitting: Cardiovascular Disease

## 2018-07-03 ENCOUNTER — Encounter: Payer: Self-pay | Admitting: Cardiovascular Disease

## 2018-07-03 VITALS — BP 158/74 | HR 75 | Ht 66.0 in | Wt 280.0 lb

## 2018-07-03 DIAGNOSIS — E782 Mixed hyperlipidemia: Secondary | ICD-10-CM

## 2018-07-03 DIAGNOSIS — I2721 Secondary pulmonary arterial hypertension: Secondary | ICD-10-CM

## 2018-07-03 DIAGNOSIS — I10 Essential (primary) hypertension: Secondary | ICD-10-CM | POA: Diagnosis not present

## 2018-07-03 DIAGNOSIS — E669 Obesity, unspecified: Secondary | ICD-10-CM

## 2018-07-03 DIAGNOSIS — G4733 Obstructive sleep apnea (adult) (pediatric): Secondary | ICD-10-CM | POA: Diagnosis not present

## 2018-07-03 DIAGNOSIS — I5033 Acute on chronic diastolic (congestive) heart failure: Secondary | ICD-10-CM

## 2018-07-03 DIAGNOSIS — M1A0720 Idiopathic chronic gout, left ankle and foot, without tophus (tophi): Secondary | ICD-10-CM

## 2018-07-03 DIAGNOSIS — E1169 Type 2 diabetes mellitus with other specified complication: Secondary | ICD-10-CM

## 2018-07-03 MED ORDER — FUROSEMIDE 40 MG PO TABS: 80.0000 mg | ORAL_TABLET | Freq: Every day | ORAL | 5 refills | Status: DC

## 2018-07-03 NOTE — Progress Notes (Signed)
Patient ID: Sue Davidson, female   DOB: 22-Jun-1951, 67 y.o.   MRN: [id number]    Cardiology Office Note    Date:  07/03/2018   ID:  Lashundra Shiveley, DOB 1950-11-22, MRN [id number]  PCP:  Deland Pretty, MD  Cardiologist:   Sanda Klein, MD   Chief Complaint  Patient presents with  . Follow-up  . Headache  . Shortness of Breath  . Chest Pain    Pressure with pain going from her left shoulder down through her left arm.  . Edema    Legs.    History of Present Illness:  Sue Davidson is a 67 y.o. female with Obesity and obstructive sleep apnea, complaints of bilateral lower extremity edema and class 2 exertional dyspnea, normal left ventricular systolic function and minimum signs of diastolic dysfunction with normal filling pressures, mild pulmonary hypertension by echo.  Since her last appointment here she finds that her breathing has deteriorated, she has more lower extremity edema, her weight has gone up by about 14-15 pounds.  She still goes to work on a daily basis and does not have difficulty with her breathing while sitting at her desk, although she believes this is 1 of the reasons her legs are swelling.  She has not checked her blood pressure in the last 3 months, but believes that the generic olmesartan does not work as well as the brand name Benicar. She does not have exertional angina and has NYHA functional class II exertional dyspnea.  She has had some difficulty with her CPAP (she wears nasal pillows and these frequently become dislodged at night).  Often she gets frustrated with them and takes the CPAP off completely.  She had a gout attack about a year ago.  She is taking allopurinol and has a supply of colchicine as needed.  In the morning  Sue Davidson has type 2 diabetes mellitus that requires treatment with insulin (Dr. Chalmers Cater), treated hyperlipidemia, treated hypertension, treated hypothyroidism. She was diagnosed many years ago with obstructive sleep apnea (sleep study performed in  2007), but only recently has started treatment with CPAP. She underwent cardiac catheterization most recently in October 2012 which showed no evidence of coronary obstructive disease. Left ventricular end-diastolic pressure was 10 mmHg. 2 years earlier in 2010 she had right and left heart catheterization that showed a pulmonary artery wedge pressure of 6 and a PA pressure of 22/9. Prior to that she had normal coronary angiograms performed in 1997 and in 2003.  Echocardiography performed in June 2018 shows: - Left ventricle: The cavity size was normal. Wall thickness was   increased in a pattern of mild LVH. Systolic function was normal.   The estimated ejection fraction was in the range of 55% to 60%.   Wall motion was normal; there were no regional wall motion   abnormalities. Doppler parameters are consistent with abnormal   left ventricular relaxation (grade 1 diastolic dysfunction). - Mitral valve: There was mild regurgitation. - Left atrium: The atrium was mildly dilated.   Past Medical History:  Diagnosis Date  . Anemia    hx of  . Anginal pain (HCC)    hx of  . Anxiety   . Depression   . Diabetes mellitus without complication ([place])   . GERD (gastroesophageal reflux disease)   . Headache(784.0)   . Heart murmur   . Hyperlipidemia   . Hypertension   . MRSA (methicillin resistant staph aureus) culture positive    many years ago-abdominal wound- no issuses since.  Sue Davidson  PONV (postoperative nausea and vomiting)    severe  . Sleep apnea    Study done -remains under evaluation- no cpap yet.  . Vitamin D deficiency     Past Surgical History:  Procedure Laterality Date  . ABDOMINAL HYSTERECTOMY  1970's  . APPENDECTOMY  1970's  . BREAST BIOPSY Left   . CARDIAC CATHETERIZATION    . CHOLECYSTECTOMY N/A 10/02/2013   Procedure: LAPAROSCOPIC CHOLECYSTECTOMY;  Surgeon: Earnstine Regal, MD;  Location: [facility];  Service: General;  Laterality: N/A;  . cyst removed Left    wrist"ganglion"    . ELBOW SURGERY Left   . HARDWARE REMOVAL Left    ankle  . KNEE ARTHROSCOPY Right 06/30/2016   Procedure: ARTHROSCOPY RIGHT KNEE WITH MEDIAL AND LATERAL MENSICAL DEBRIDEMENT;  Surgeon: Gaynelle Arabian, MD;  Location: [facility];  Service: Orthopedics;  Laterality: Right;  LMA  . LEG SURGERY Left 1980's   broke leg and ankle  . SHOULDER ARTHROSCOPY Bilateral   . TONSILLECTOMY  1970's  . TUBAL LIGATION  1970's    Outpatient Medications Prior to Visit  Medication Sig Dispense Refill  . acetaminophen (TYLENOL) 500 MG tablet Take 500 mg by mouth every 6 (six) hours as needed for moderate pain.    Sue Davidson allopurinol (ZYLOPRIM) 100 MG tablet Take 100 mg by mouth 2 (two) times daily.    Sue Davidson aspirin 81 MG tablet Take 81 mg by mouth daily.    . cholecalciferol (VITAMIN D) 1000 units tablet Take 1,000 Units by mouth daily.    Sue Davidson HUMALOG MIX 75/25 (75-25) 100 UNIT/ML SUSP injection Inject into the skin 3 (three) times daily with meals. 70 units every morning. 15-25 units at lunch on a sliding scale depending on blood sugar. 85 units with supper.    . levothyroxine (SYNTHROID, LEVOTHROID) 75 MCG tablet Take 75 mcg by mouth daily before breakfast.    . olmesartan (BENICAR) 40 MG tablet Take 1 tablet (40 mg total) by mouth daily. 90 tablet 3  . omeprazole (PRILOSEC) 40 MG capsule Take 40 mg by mouth daily. Reported on 01/19/2016    . ondansetron (ZOFRAN) 8 MG tablet Take 8 mg by mouth 2 (two) times daily as needed for nausea or vomiting.    . rosuvastatin (CRESTOR) 20 MG tablet Take 20 mg by mouth daily with breakfast.     . sertraline (ZOLOFT) 100 MG tablet Take 100 mg by mouth daily.    . furosemide (LASIX) 40 MG tablet Take 1 tablet (40 mg total) by mouth daily. Alternates taking 40mg  then 80mg . (Patient taking differently: Take 40 mg by mouth daily. ) 30 tablet 0  . Biotin 10000 MCG TABS Take 1 tablet by mouth daily.    Sue Davidson HYDROcodone-acetaminophen (NORCO) 5-325 MG tablet Take 1-2 tablets by mouth every 4 (four)  hours as needed for moderate pain. (Patient not taking: Reported on 05/23/2017) 30 tablet 0  . methocarbamol (ROBAXIN) 500 MG tablet Take 1 tablet (500 mg total) by mouth 4 (four) times daily. As needed for muscle spasm (Patient not taking: Reported on 05/23/2017) 30 tablet 1  . predniSONE (DELTASONE) 20 MG tablet Take 60 mg daily x 2 days then 40 mg daily x 2 days then 20 mg daily x 2 days 12 tablet 0   No facility-administered medications prior to visit.      Allergies:   Tape   Social History   Socioeconomic History  . Marital status: Married    Spouse name: Not on file  .  Number of children: Not on file  . Years of education: Not on file  . Highest education level: Not on file  Occupational History  . Not on file  Social Needs  . Financial resource strain: Not on file  . Food insecurity:    Worry: Not on file    Inability: Not on file  . Transportation needs:    Medical: Not on file    Non-medical: Not on file  Tobacco Use  . Smoking status: Never Smoker  . Smokeless tobacco: Never Used  Substance and Sexual Activity  . Alcohol use: No    Alcohol/week: 0.0 standard drinks  . Drug use: No  . Sexual activity: Yes    Birth control/protection: Spermicide  Lifestyle  . Physical activity:    Days per week: Not on file    Minutes per session: Not on file  . Stress: Not on file  Relationships  . Social connections:    Talks on phone: Not on file    Gets together: Not on file    Attends religious service: Not on file    Active member of club or organization: Not on file    Attends meetings of clubs or organizations: Not on file    Relationship status: Not on file  Other Topics Concern  . Not on file  Social History Narrative   Epworth Sleepiness Scale Score:  15      --I have HTN   --I have had Insomnia   --I feel stressed and lack motivation   --I have Diabetes   --I am overweight or am gaining weight   --I awake feeling not rested     Family History:  The  patient's family history includes Breast cancer in her paternal aunt; Cancer in her father, paternal aunt, and paternal aunt; Diabetes in her mother; Heart disease in her father and mother.   ROS:   Please see the history of present illness.    ROS All other systems reviewed and are negative   PHYSICAL EXAM:   VS:  BP (!) 158/74 (BP Location: Left Arm, Patient Position: Sitting, Cuff Size: Large)   Pulse 75   Ht 5\' 6"  (1.676 m)   Wt 280 lb (127 kg)   BMI 45.19 kg/m      General: Alert, oriented x3, no distress, morbidly Head: no evidence of trauma, PERRL, EOMI, no exophtalmos or lid lag, no myxedema, no xanthelasma; normal ears, nose and oropharynx Neck: normal jugular venous pulsations and no hepatojugular reflux; brisk carotid pulses without delay and no carotid bruits Chest: clear to auscultation, no signs of consolidation by percussion or palpation, normal fremitus, symmetrical and full respiratory excursions Cardiovascular: normal position and quality of the apical impulse, regular rhythm, normal first and second heart sounds, no murmurs, rubs or gallops Abdomen: no tenderness or distention, no masses by palpation, no abnormal pulsatility or arterial bruits, normal bowel sounds, no hepatosplenomegaly Extremities: no clubbing, cyanosis or edema; 2+ radial, ulnar and brachial pulses bilaterally; 2+ right femoral, posterior tibial and dorsalis pedis pulses; 2+ left femoral, posterior tibial and dorsalis pedis pulses; no subclavian or femoral bruits Neurological: grossly nonfocal Psych: Normal mood and affect   Wt Readings from Last 3 Encounters:  07/03/18 280 lb (127 kg)  05/19/17 270 lb (122.5 kg)  05/12/17 266 lb (120.7 kg)      Studies/Labs Reviewed:   EKG:  EKG is ordered today.  It shows NSR, LAFB (old).  Recent Labs: No results found for requested  labs within last 8760 hours.   Lipid PanelBefore treatment    Component Value Date/Time   CHOL 225 (H) 07/15/2011 0653    TRIG 186 (H) 07/15/2011 0653   HDL 30 (L) 07/15/2011 0653   CHOLHDL 7.5 07/15/2011 0653   VLDL 37 07/15/2011 0653   LDLCALC 158 (H) 07/15/2011 0653   May 2018 Total cholesterol 174, HDL 34, LDL 118, triglycerides 109 Hemoglobin A1c 8.4% TSH 2.04 Potassium 4.3, creatinine 0.86, hemoglobin 12.1, normal LFTs  ASSESSMENT:    1. PAH (pulmonary artery hypertension) ([place])   2. Acute on chronic diastolic heart failure ([place])   3. Chronic idiopathic gout involving toe of left foot without tophus   4. OSA (obstructive sleep apnea)   5. Essential hypertension   6. Morbid obesity ([place])   7. Diabetes mellitus type 2 in obese ([place])   8. Mixed hyperlipidemia      PLAN:  In order of problems listed above:  1. Pulmonary artery hypertension: Consistent with obstructive sleep apnea, morbid obesity, some component of diastolic dysfunction.  Urged consistent CPAP use 2. CHF: Clinically hypervolemic with findings of both the right and left heart failure.  Increase furosemide to 80 mg daily until she achieves a target weight of 266 pounds and will probably decrease back to alternating doses of 40 mg and 80 mg daily.  Check labs at follow-up visit with APP. 3. Gout: Warned her that increasing the diuretic dose may worsen the likelihood of gout.  She should treat any early symptoms promptly with colchicine.  Avoid NSAIDs 4. OSA: Sleep clinic follow-up with Dr. Claiborne Billings 5. HTN: Reevaluate her blood pressure after diuresis.  If it is still high we will add low-dose amlodipine 6. Obesity: Discussed her diet and exercise 7. DM: Most recent hemoglobin A1c 8.4%, follows up with Dr. Chalmers Cater. 8. HLP: LDL cholesterol is higher than last year, asked her to make sure she is compliant with daily rosuvastatin  Medication Adjustments/Labs and Tests Ordered: Current medicines are reviewed at length with the patient today.  Concerns regarding medicines are outlined above.  Medication changes, Labs and Tests ordered today  are listed in the Patient Instructions below. Patient Instructions  Dr Sallyanne Kuster has recommended making the following medication changes: 1. INCREASE Furosemide to 80 mg daily  Your physician has requested that you regularly monitor your blood pressure at home. Please use the same machine to check your blood pressure daily. Keep a record of your blood pressures using the log sheet provided. In 1 week, please report your readings back to Dr C. You may use our online patient portal 'MyChart' or you can call the office to speak with a nurse.  Your physician recommends that you weigh, daily, at the same time every day, and in the same amount of clothing. Please record your daily weights on the handout provided and bring it to your next appointment.   target weight = 266 pounds on home scale  Your physician recommends that you schedule a follow-up appointment in 4 weeks with a NP/PA.  Dr Sallyanne Kuster recommends that you schedule a follow-up appointment in 6 months. You will receive a reminder letter in the mail two months in advance. If you don't receive a letter, please call our office to schedule the follow-up appointment.  If you need a refill on your cardiac medications before your next appointment, please call your pharmacy.      Signed, Sanda Klein, MD  07/03/2018 8:44 AM     [facility]  [address], [place], [place]  [address] Phone: [phone]; Fax: [phone]    

## 2018-07-03 NOTE — Patient Instructions (Signed)
Dr Sallyanne Kuster has recommended making the following medication changes: 1. INCREASE Furosemide to 80 mg daily  Your physician has requested that you regularly monitor your blood pressure at home. Please use the same machine to check your blood pressure daily. Keep a record of your blood pressures using the log sheet provided. In 1 week, please report your readings back to Dr C. You may use our online patient portal 'MyChart' or you can call the office to speak with a nurse.  Your physician recommends that you weigh, daily, at the same time every day, and in the same amount of clothing. Please record your daily weights on the handout provided and bring it to your next appointment.   target weight = 266 pounds on home scale  Your physician recommends that you schedule a follow-up appointment in 4 weeks with a NP/PA.  Dr Sallyanne Kuster recommends that you schedule a follow-up appointment in 6 months. You will receive a reminder letter in the mail two months in advance. If you don't receive a letter, please call our office to schedule the follow-up appointment.  If you need a refill on your cardiac medications before your next appointment, please call your pharmacy.

## 2018-07-04 DIAGNOSIS — G4733 Obstructive sleep apnea (adult) (pediatric): Secondary | ICD-10-CM | POA: Diagnosis not present

## 2018-07-28 NOTE — Progress Notes (Signed)
Cardiology Office Note   Date:  07/31/2018   ID:  [name], DOB 1950/12/08, MRN [id number]  PCP:  Deland Pretty, MD  Cardiologist: Dr.Croitoru  Chief Complaint  Patient presents with  . Follow-up  . Leg Swelling     History of Present Illness: Sue Davidson is a 67 y.o. female who presents for ongoing assessment and management of chronic dyspnea, cardiac catheterization October 2012 with no evidence of coronary disease at that time.  She also has a history of bilateral lower extremity edema, obstructive sleep apnea (on CPAP-but does not use consistently), morbid obesity, chronic diastolic heart failure with normal filling pressures and mild pulmonary hypertension by echo.  Other history includes type 2 diabetes which is insulin-dependent, hyperlipidemia, hypertension, treated hypothyroidism.  Patient was last seen in the office by Dr. Sallyanne Kuster on 07/03/2018, at which time she was complaining of worsening dyspnea and more lower extremity edema with weight gain of 14 to 15 pounds.  The patient was advised to increase furosemide to 80 mg daily until she achieves a target weight of 266 pounds, with plans to decrease back to 40 mg alternating with 80 mg daily.  The patient also was found to be hypertensive which would be reevaluated after diuresis.  She is here for follow-up concerning her response to medication adjustment.  She is doing much better concerning fluid retention, and per her scale, has lost 8 lbs. She is complaining of cramping in her legs. BP is still elevated. She states that since she changed to generic Benicar, BP has not been very well controlled compared to previous readings on name brand. She also speaks at length about losing her husband. She continues to morn him.   Past Medical History:  Diagnosis Date  . Anemia    hx of  . Anginal pain (HCC)    hx of  . Anxiety   . Depression   . Diabetes mellitus without complication ([place])   . GERD (gastroesophageal reflux disease)     . Headache(784.0)   . Heart murmur   . Hyperlipidemia   . Hypertension   . MRSA (methicillin resistant staph aureus) culture positive    many years ago-abdominal wound- no issuses since.  Marland Kitchen PONV (postoperative nausea and vomiting)    severe  . Sleep apnea    Study done -remains under evaluation- no cpap yet.  . Vitamin D deficiency     Past Surgical History:  Procedure Laterality Date  . ABDOMINAL HYSTERECTOMY  1970's  . APPENDECTOMY  1970's  . BREAST BIOPSY Left   . CARDIAC CATHETERIZATION    . CHOLECYSTECTOMY N/A 10/02/2013   Procedure: LAPAROSCOPIC CHOLECYSTECTOMY;  Surgeon: Earnstine Regal, MD;  Location: [facility];  Service: General;  Laterality: N/A;  . cyst removed Left    wrist"ganglion"  . ELBOW SURGERY Left   . HARDWARE REMOVAL Left    ankle  . KNEE ARTHROSCOPY Right 06/30/2016   Procedure: ARTHROSCOPY RIGHT KNEE WITH MEDIAL AND LATERAL MENSICAL DEBRIDEMENT;  Surgeon: Gaynelle Arabian, MD;  Location: [facility];  Service: Orthopedics;  Laterality: Right;  LMA  . LEG SURGERY Left 1980's   broke leg and ankle  . SHOULDER ARTHROSCOPY Bilateral   . TONSILLECTOMY  1970's  . TUBAL LIGATION  1970's     Current Outpatient Medications  Medication Sig Dispense Refill  . acetaminophen (TYLENOL) 500 MG tablet Take 500 mg by mouth every 6 (six) hours as needed for moderate pain.    Marland Kitchen allopurinol (ZYLOPRIM) 100 MG tablet Take  100 mg by mouth 2 (two) times daily.    Marland Kitchen aspirin 81 MG tablet Take 81 mg by mouth daily.    . cholecalciferol (VITAMIN D) 1000 units tablet Take 1,000 Units by mouth daily.    . furosemide (LASIX) 40 MG tablet Take 2 tablets (80 mg total) by mouth daily. Alternates taking 40mg  then 80mg . 60 tablet 5  . HUMALOG MIX 75/25 (75-25) 100 UNIT/ML SUSP injection Inject into the skin 3 (three) times daily with meals. 70 units every morning. 15-25 units at lunch on a sliding scale depending on blood sugar. 85 units with supper.    . levothyroxine (SYNTHROID, LEVOTHROID) 75  MCG tablet Take 75 mcg by mouth daily before breakfast.    . olmesartan (BENICAR) 40 MG tablet Take 1 tablet (40 mg total) by mouth daily. 90 tablet 3  . omeprazole (PRILOSEC) 40 MG capsule Take 40 mg by mouth daily. Reported on 01/19/2016    . ondansetron (ZOFRAN) 8 MG tablet Take 8 mg by mouth 2 (two) times daily as needed for nausea or vomiting.    . rosuvastatin (CRESTOR) 20 MG tablet Take 20 mg by mouth daily with breakfast.     . sertraline (ZOLOFT) 100 MG tablet Take 100 mg by mouth daily.     No current facility-administered medications for this visit.     Allergies:   Tape    Social History:  The patient  reports that she has never smoked. She has never used smokeless tobacco. She reports that she does not drink alcohol or use drugs.   Family History:  The patient's family history includes Breast cancer in her paternal aunt; Cancer in her father, paternal aunt, and paternal aunt; Diabetes in her mother; Heart disease in her father and mother.    ROS: All other systems are reviewed and negative. Unless otherwise mentioned in H&P    PHYSICAL EXAM: VS:  BP (!) 150/68   Pulse 81   Ht 5\' 6"  (1.676 m)   Wt 276 lb 3.2 oz (125.3 kg)   SpO2 95%   BMI 44.58 kg/m  , BMI Body mass index is 44.58 kg/m. GEN: Well nourished, well developed, in no acute distress  HEENT: normal  Neck: no JVD, bilateral carotid bruits, or masses Cardiac: RRR; 1/6 systolic murmurs, rubs, or gallops,no edema  Respiratory:  clear to auscultation bilaterally, normal work of breathing GI: soft, nontender, nondistended, + BS MS: no deformity or atrophy  Skin: warm and dry, no rash Neuro:  Strength and sensation are intact Psych: euthymic mood, full affect   EKG: Not completed this office visit.   Recent Labs: No results found for requested labs within last 8760 hours.    Lipid Panel    Component Value Date/Time   CHOL 225 (H) 07/15/2011 0653   TRIG 186 (H) 07/15/2011 0653   HDL 30 (L) 07/15/2011  0653   CHOLHDL 7.5 07/15/2011 0653   VLDL 37 07/15/2011 0653   LDLCALC 158 (H) 07/15/2011 0653      Wt Readings from Last 3 Encounters:  07/31/18 276 lb 3.2 oz (125.3 kg)  07/03/18 280 lb (127 kg)  05/19/17 270 lb (122.5 kg)      Other studies Reviewed: Echocardiogram 04-27-2017 Left ventricle: The cavity size was normal. Wall thickness was   increased in a pattern of mild LVH. Systolic function was normal.   The estimated ejection fraction was in the range of 55% to 60%.   Wall motion was normal; there were no  regional wall motion   abnormalities. Doppler parameters are consistent with abnormal   left ventricular relaxation (grade 1 diastolic dysfunction). - Aortic valve: Valve area (VTI): 1.63 cm^2. Valve area (Vmax):   1.57 cm^2. Valve area (Vmean): 1.37 cm^2. - Mitral valve: There was mild regurgitation. - Left atrium: The atrium was mildly dilated.  NM Stress Test:: 05/13/2017 Overall Study Impression Myocardial perfusion is normal. Probable normal perfusion No significant ischemia or scar. Overall left ventricular systolic function was normal. LV cavity size is normal. Nuclear stress EF: 74%. The left ventricular ejection fraction is hyperdynamic (>65%). There is no prior study for comparison.     ASSESSMENT AND PLAN:  1. Hypertension: BP is not optimal for diabetic patient. Will need additional titration vs additional medications. However, will need BMET first to ascertain kidney function on higher doses of lasix. She will likely need to have to add agent as olmesartan is at maximum dose.   2. Chronic LEE: Improved with increased dose of lasix to 40 mg alternating with 80 mg every other day. Checking BMET.   3. Bilateral carotid bruits: With known diabetes, will recheck her carotid ultrasound for progression of disease. Last study 04/2017 showing minimal stenosis.   4. Obesity: Encouraged more activity to assist with weight loss. 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    Current medicines are reviewed at length with the patient today.    Labs/ tests ordered today include: BMET, Carotid Ultrasound.  Phill Myron. West Pugh, ANP, AACC   07/31/2018 7:46 AM    [facility] [address], [place], [place] [address] Phone: [phone]; Fax: [phone]

## 2018-07-31 ENCOUNTER — Encounter: Payer: Self-pay | Admitting: Adult Health

## 2018-07-31 ENCOUNTER — Ambulatory Visit: Payer: 59 | Admitting: Adult Health

## 2018-07-31 VITALS — BP 150/68 | HR 81 | Ht 66.0 in | Wt 276.2 lb

## 2018-07-31 DIAGNOSIS — I10 Essential (primary) hypertension: Secondary | ICD-10-CM | POA: Diagnosis not present

## 2018-07-31 DIAGNOSIS — I779 Disorder of arteries and arterioles, unspecified: Secondary | ICD-10-CM

## 2018-07-31 DIAGNOSIS — R600 Localized edema: Secondary | ICD-10-CM

## 2018-07-31 DIAGNOSIS — Z79899 Other long term (current) drug therapy: Secondary | ICD-10-CM | POA: Diagnosis not present

## 2018-07-31 DIAGNOSIS — I739 Peripheral vascular disease, unspecified: Principal | ICD-10-CM

## 2018-07-31 LAB — BASIC METABOLIC PANEL
BUN/Creatinine Ratio: 19 (ref 12–28)
BUN: 17 mg/dL (ref 8–27)
CALCIUM: 9.2 mg/dL (ref 8.7–10.3)
CO2: 27 mmol/L (ref 20–29)
CREATININE: 0.89 mg/dL (ref 0.57–1.00)
Chloride: 97 mmol/L (ref 96–106)
GFR, EST AFRICAN AMERICAN: 77 mL/min/{1.73_m2} (ref >59)
GFR, EST NON AFRICAN AMERICAN: 67 mL/min/{1.73_m2} (ref >59)
Glucose: 219 mg/dL — ABNORMAL HIGH (ref 65–99)
Potassium: 4.5 mmol/L (ref 3.5–5.2)
Sodium: 140 mmol/L (ref 134–144)

## 2018-07-31 NOTE — Patient Instructions (Signed)
Medication Instructions:  NO CHANGES- Your physician recommends that you continue on your current medications as directed. Please refer to the Current Medication list given to you today.\ If you need a refill on your cardiac medications before your next appointment, please call your pharmacy.  Labwork: bmet today HERE IN OUR OFFICE AT [facility]  Take the provided lab slips with you to the lab for your blood draw.   Testing/Procedures: Your physician has requested that you have a carotid duplex. This test is an ultrasound of the carotid arteries in your neck. It looks at blood flow through these arteries that supply the brain with blood. Allow one hour for this exam. There are no restrictions or special instructions.  Follow-Up: Your physician wants you to follow-up in: AFTER TESTING WITH DR CROITORU-ORJory Sims DNP(NURSE PRACTITIONER),AACC IF PRIMARY CARDIOLOGIST IS UNAVAILABLE.    Thank you for choosing [facility] at [facility]!!

## 2018-07-31 NOTE — Progress Notes (Signed)
Thanks, please let me know when BMET ready MCr

## 2018-08-09 ENCOUNTER — Ambulatory Visit (HOSPITAL_COMMUNITY)
Admission: RE | Admit: 2018-08-09 | Discharge: 2018-08-09 | Disposition: A | Discharge status: Home / Self Care | Payer: 59 | Source: Ambulatory Visit | Attending: Internal Medicine | Admitting: Internal Medicine

## 2018-08-09 DIAGNOSIS — I779 Disorder of arteries and arterioles, unspecified: Secondary | ICD-10-CM

## 2018-08-09 DIAGNOSIS — I739 Peripheral vascular disease, unspecified: Secondary | ICD-10-CM | POA: Diagnosis not present

## 2018-08-13 NOTE — Progress Notes (Deleted)
Cardiology Office Note   Date:  08/13/2018   ID:  Sue Davidson, DOB 06-May-1951, MRN [id number]  PCP:  Deland Pretty, MD  Cardiologist: Dr. Sallyanne Kuster No chief complaint on file.    History of Present Illness: Sue Davidson is a 67 y.o. female who presents for ongoing assessment and management of chronic diastolic heart failure, mild pulmonary hypertension by echo, class II exertional dyspnea, history of bilateral lower extremity edema, OSA, and obesity.  She has had some difficulty using her CPAP at night and gets frustrated with the CPAP and often takes it off.  Other history includes type 2 diabetes that requires insulin followed by Dr. Chalmers Cater, hyperlipidemia, and hypothyroidism.  On last office visit with Dr. Sallyanne Kuster the patient was hypervolemic with findings of mixed CHF.  Lasix was increased to 80 mg daily until she achieves a target rate of 266 pounds, and then decrease back to alternating doses of 40 mg daily with 80 mg daily.  She is here to re-evaluate her blood pressure response along with kidney function post medication changes.  Consider adding amlodipine if blood pressure is not well controlled.  Most recent labs dated 07/31/2018: Sodium 140; potassium 4.5; chloride 97; CO2 27; glucose 219; creatinine 0.89.    Past Medical History:  Diagnosis Date  . Anemia    hx of  . Anginal pain (HCC)    hx of  . Anxiety   . Depression   . Diabetes mellitus without complication ([place])   . GERD (gastroesophageal reflux disease)   . Headache(784.0)   . Heart murmur   . Hyperlipidemia   . Hypertension   . MRSA (methicillin resistant staph aureus) culture positive    many years ago-abdominal wound- no issuses since.  Marland Kitchen PONV (postoperative nausea and vomiting)    severe  . Sleep apnea    Study done -remains under evaluation- no cpap yet.  . Vitamin D deficiency     Past Surgical History:  Procedure Laterality Date  . ABDOMINAL HYSTERECTOMY  1970's  . APPENDECTOMY  1970's  . BREAST  BIOPSY Left   . CARDIAC CATHETERIZATION    . CHOLECYSTECTOMY N/A 10/02/2013   Procedure: LAPAROSCOPIC CHOLECYSTECTOMY;  Surgeon: Earnstine Regal, MD;  Location: [facility];  Service: General;  Laterality: N/A;  . cyst removed Left    wrist"ganglion"  . ELBOW SURGERY Left   . HARDWARE REMOVAL Left    ankle  . KNEE ARTHROSCOPY Right 06/30/2016   Procedure: ARTHROSCOPY RIGHT KNEE WITH MEDIAL AND LATERAL MENSICAL DEBRIDEMENT;  Surgeon: Gaynelle Arabian, MD;  Location: [facility];  Service: Orthopedics;  Laterality: Right;  LMA  . LEG SURGERY Left 1980's   broke leg and ankle  . SHOULDER ARTHROSCOPY Bilateral   . TONSILLECTOMY  1970's  . TUBAL LIGATION  1970's     Current Outpatient Medications  Medication Sig Dispense Refill  . acetaminophen (TYLENOL) 500 MG tablet Take 500 mg by mouth every 6 (six) hours as needed for moderate pain.    Marland Kitchen allopurinol (ZYLOPRIM) 100 MG tablet Take 100 mg by mouth 2 (two) times daily.    Marland Kitchen aspirin 81 MG tablet Take 81 mg by mouth daily.    . cholecalciferol (VITAMIN D) 1000 units tablet Take 1,000 Units by mouth daily.    . furosemide (LASIX) 40 MG tablet Take 2 tablets (80 mg total) by mouth daily. Alternates taking 40mg  then 80mg . 60 tablet 5  . HUMALOG MIX 75/25 (75-25) 100 UNIT/ML SUSP injection Inject into the skin 3 (three)  times daily with meals. 70 units every morning. 15-25 units at lunch on a sliding scale depending on blood sugar. 85 units with supper.    . levothyroxine (SYNTHROID, LEVOTHROID) 75 MCG tablet Take 75 mcg by mouth daily before breakfast.    . olmesartan (BENICAR) 40 MG tablet Take 1 tablet (40 mg total) by mouth daily. 90 tablet 3  . omeprazole (PRILOSEC) 40 MG capsule Take 40 mg by mouth daily. Reported on 01/19/2016    . ondansetron (ZOFRAN) 8 MG tablet Take 8 mg by mouth 2 (two) times daily as needed for nausea or vomiting.    . rosuvastatin (CRESTOR) 20 MG tablet Take 20 mg by mouth daily with breakfast.     . sertraline (ZOLOFT) 100 MG  tablet Take 100 mg by mouth daily.     No current facility-administered medications for this visit.     Allergies:   Tape    Social History:  The patient  reports that she has never smoked. She has never used smokeless tobacco. She reports that she does not drink alcohol or use drugs.   Family History:  The patient's family history includes Breast cancer in her paternal aunt; Cancer in her father, paternal aunt, and paternal aunt; Diabetes in her mother; Heart disease in her father and mother.    ROS: All other systems are reviewed and negative. Unless otherwise mentioned in H&P    PHYSICAL EXAM: VS:  There were no vitals taken for this visit. , BMI There is no height or weight on file to calculate BMI. GEN: Well nourished, well developed, in no acute distress HEENT: normal Neck: no JVD, carotid bruits, or masses Cardiac: ***RRR; no murmurs, rubs, or gallops,no edema  Respiratory:  Clear to auscultation bilaterally, normal work of breathing GI: soft, nontender, nondistended, + BS MS: no deformity or atrophy Skin: warm and dry, no rash Neuro:  Strength and sensation are intact Psych: euthymic mood, full affect   EKG:  EKG {ACTION; IS/IS [id number]} ordered today. The ekg ordered today demonstrates ***   Recent Labs: 07/31/2018: BUN 17; Creatinine, Ser 0.89; Potassium 4.5; Sodium 140    Lipid Panel    Component Value Date/Time   CHOL 225 (H) 07/15/2011 0653   TRIG 186 (H) 07/15/2011 0653   HDL 30 (L) 07/15/2011 0653   CHOLHDL 7.5 07/15/2011 0653   VLDL 37 07/15/2011 0653   LDLCALC 158 (H) 07/15/2011 0653      Wt Readings from Last 3 Encounters:  07/31/18 276 lb 3.2 oz (125.3 kg)  07/03/18 280 lb (127 kg)  05/19/17 270 lb (122.5 kg)      Other studies Reviewed: 04/14/2017 Left ventricle: The cavity size was normal. Wall thickness was   increased in a pattern of mild LVH. Systolic function was normal.   The estimated ejection fraction was in the range of 55% to  60%.   Wall motion was normal; there were no regional wall motion   abnormalities. Doppler parameters are consistent with abnormal   left ventricular relaxation (grade 1 diastolic dysfunction). - Aortic valve: Valve area (VTI): 1.63 cm^2. Valve area (Vmax):   1.57 cm^2. Valve area (Vmean): 1.37 cm^2. - Mitral valve: There was mild regurgitation. - Left atrium: The atrium was mildly dilated.  ASSESSMENT AND PLAN:  1.  ***   Current medicines are reviewed at length with the patient today.    Labs/ tests ordered today include: *** Phill Myron. [place] Pugh, ANP, AACC   08/13/2018 2:11 PM  [address] Office [phone] Fax [phone]

## 2018-08-14 ENCOUNTER — Ambulatory Visit: Payer: 59 | Admitting: Adult Health

## 2018-09-02 DIAGNOSIS — Z23 Encounter for immunization: Secondary | ICD-10-CM | POA: Diagnosis not present

## 2018-09-25 DIAGNOSIS — E1165 Type 2 diabetes mellitus with hyperglycemia: Secondary | ICD-10-CM | POA: Diagnosis not present

## 2018-09-25 DIAGNOSIS — E559 Vitamin D deficiency, unspecified: Secondary | ICD-10-CM | POA: Diagnosis not present

## 2018-09-25 DIAGNOSIS — E7800 Pure hypercholesterolemia, unspecified: Secondary | ICD-10-CM | POA: Diagnosis not present

## 2018-11-07 DIAGNOSIS — G4733 Obstructive sleep apnea (adult) (pediatric): Secondary | ICD-10-CM | POA: Diagnosis not present

## 2018-11-08 ENCOUNTER — Other Ambulatory Visit: Payer: Self-pay | Admitting: Cardiovascular Disease

## 2018-11-20 DIAGNOSIS — E559 Vitamin D deficiency, unspecified: Secondary | ICD-10-CM | POA: Diagnosis not present

## 2018-11-20 DIAGNOSIS — I10 Essential (primary) hypertension: Secondary | ICD-10-CM | POA: Diagnosis not present

## 2018-11-20 DIAGNOSIS — E7800 Pure hypercholesterolemia, unspecified: Secondary | ICD-10-CM | POA: Diagnosis not present

## 2018-11-22 DIAGNOSIS — E7800 Pure hypercholesterolemia, unspecified: Secondary | ICD-10-CM | POA: Diagnosis not present

## 2018-11-22 DIAGNOSIS — E1165 Type 2 diabetes mellitus with hyperglycemia: Secondary | ICD-10-CM | POA: Diagnosis not present

## 2018-11-22 DIAGNOSIS — E039 Hypothyroidism, unspecified: Secondary | ICD-10-CM | POA: Diagnosis not present

## 2018-12-19 DIAGNOSIS — M5416 Radiculopathy, lumbar region: Secondary | ICD-10-CM | POA: Insufficient documentation

## 2018-12-20 DIAGNOSIS — Z8739 Personal history of other diseases of the musculoskeletal system and connective tissue: Secondary | ICD-10-CM | POA: Insufficient documentation

## 2018-12-21 ENCOUNTER — Ambulatory Visit: Payer: 59 | Admitting: Family Medicine

## 2018-12-21 ENCOUNTER — Encounter

## 2018-12-30 ENCOUNTER — Encounter: Payer: Self-pay | Admitting: Physical Medicine and Rehabilitation

## 2019-03-23 ENCOUNTER — Telehealth: Payer: Self-pay | Admitting: Cardiovascular Disease

## 2019-03-23 NOTE — Telephone Encounter (Signed)
° °  Pt c/o swelling: STAT is pt has developed SOB within 24 hours  1) How much weight have you gained and in what time span? 7lbs in 1 week  2) If swelling, where is the swelling located? Legs, ankles, feet  3) Are you currently taking a fluid pill? yes  4) Are you currently SOB? "at times"  5) Do you have a log of your daily weights (if so, list)? 280 today  6) Have you gained 3 pounds in a day or 5 pounds in a week?   7) Have you traveled recently? No     1. What are your last 5 BP readings? 169/94, 170/84, 168/81, 169/79  2. Are you having any other symptoms (ex. Dizziness, headache, blurred vision, passed out)? Cough for months  3. What is your BP issue?

## 2019-03-23 NOTE — Telephone Encounter (Signed)
Returned call to patient of Dr. Sallyanne Kuster  She reports SOB when walking from her apartment to her car and SOB when walking short distances. She doesn't "feel bad". She reports bilateral LE swelling in ankle/lower legs which is normal but this week is worse than baseline. She reports swelling in legs, ankles, feet and has gained 7lbs in 1 week. She reports swelling generally goes away overnight but this week has persisted. She monitors salt/sodium intake and does not add this to foods. She is compliant with lasix (alternates 40mg /80mg ). Advised that patient take additional 40mg  of lasix today. Will route to MD to review and advise. Her last appointment was >6 months ago.

## 2019-03-23 NOTE — Telephone Encounter (Signed)
The patient has been called and notified to take 80 mg of the furosemide daily until she loses the 7 pounds and then go back to her original dosage. She will call back next week if she does not see any improvement.  Message sent to scheduling to set her up with an APP or Dr. Sallyanne Kuster.

## 2019-03-23 NOTE — Telephone Encounter (Signed)
Please ask her to increase her furosemide to 80 mg daily until she loses the 7 pounds that she has gained, then go back to her previous furosemide prescription. Make a virtual visit f/u , APP OK.

## 2019-04-03 ENCOUNTER — Telehealth: Payer: Self-pay

## 2019-04-03 NOTE — Telephone Encounter (Signed)
Left a detailed message for the patient about her upcoming appointment. Need to verify if patient is a virtual visit and no an office visit. Asked that she give the office a call back and ask for me.

## 2019-04-04 ENCOUNTER — Encounter: Payer: Self-pay | Admitting: Family Medicine

## 2019-04-04 ENCOUNTER — Telehealth (INDEPENDENT_AMBULATORY_CARE_PROVIDER_SITE_OTHER): Payer: 59 | Admitting: Physician Assistant

## 2019-04-04 VITALS — BP 146/82 | Ht 66.0 in | Wt 274.0 lb

## 2019-04-04 DIAGNOSIS — Z9989 Dependence on other enabling machines and devices: Secondary | ICD-10-CM

## 2019-04-04 DIAGNOSIS — E119 Type 2 diabetes mellitus without complications: Secondary | ICD-10-CM

## 2019-04-04 DIAGNOSIS — I10 Essential (primary) hypertension: Secondary | ICD-10-CM

## 2019-04-04 DIAGNOSIS — E785 Hyperlipidemia, unspecified: Secondary | ICD-10-CM

## 2019-04-04 DIAGNOSIS — E039 Hypothyroidism, unspecified: Secondary | ICD-10-CM

## 2019-04-04 DIAGNOSIS — R600 Localized edema: Secondary | ICD-10-CM | POA: Diagnosis not present

## 2019-04-04 DIAGNOSIS — G4733 Obstructive sleep apnea (adult) (pediatric): Secondary | ICD-10-CM

## 2019-04-04 MED ORDER — FUROSEMIDE 80 MG PO TABS: 80.0000 mg | ORAL_TABLET | Freq: Every day | ORAL | 1 refills | Status: DC

## 2019-04-04 MED ORDER — CARVEDILOL 3.125 MG PO TABS: 3.1250 mg | ORAL_TABLET | Freq: Two times a day (BID) | ORAL | 2 refills | Status: DC

## 2019-04-04 NOTE — Patient Instructions (Signed)
Medication Instructions:   START Carvedilol 3.125 Mg 2 times a day INCREASE Lasix to 80 Mg daily    If you need a refill on your cardiac medications before your next appointment, please call your pharmacy.   Lab work:  You will need to come into the office in 1 week to have labs (blood work) drawn:  BMET  If you have labs (blood work) drawn today and your tests are completely normal, you will receive your results only by: Marland Kitchen MyChart Message (if you have MyChart) OR . A paper copy in the mail If you have any lab test that is abnormal or we need to change your treatment, we will call you to review the results.  Testing/Procedures:  NONE ordered at this time of appointment   Follow-Up: At [facility], you and your health needs are our priority.  As part of our continuing mission to provide you with exceptional heart care, we have created designated Provider Care Teams.  These Care Teams include your primary Cardiologist (physician) and Advanced Practice Providers (APPs -  Physician Assistants and Nurse Practitioners) who all work together to provide you with the care you need, when you need it. . Your physician recommends that you schedule a follow-up appointment in 2 weeks   Any Other Special Instructions Will Be Listed Below (If Applicable).

## 2019-04-04 NOTE — Progress Notes (Signed)
Virtual Visit via Telephone Note   This visit type was conducted due to national recommendations for restrictions regarding the COVID-19 Pandemic (e.g. social distancing) in an effort to limit this patient's exposure and mitigate transmission in our community.  Due to her co-morbid illnesses, this patient is at least at moderate risk for complications without adequate follow up.  This format is felt to be most appropriate for this patient at this time.  The patient did not have access to video technology/had technical difficulties with video requiring transitioning to audio format only (telephone).  All issues noted in this document were discussed and addressed.  No physical exam could be performed with this format.  Please refer to the patient's chart for her  consent to telehealth for [facility].   Date:  04/06/2019   ID:  Sue Davidson, DOB 02-Mar-1951, MRN [id number]  Patient Location: Home Provider Location: Home  PCP:  Deland Pretty, MD  Cardiologist:  Sanda Klein, MD  Electrophysiologist:  None   Evaluation Performed:  Follow-Up Visit  Chief Complaint:  Leg edema  History of Present Illness:    Sue Davidson is a 68 y.o. female with past medical history of obstructive sleep apnea on CPAP, obesity, gout, bilateral lower extremity edema with normal LV function, hypothyroidism, DM 2, hyperlipidemia and hypertension.  She had remote coronary angiogram in 1997 and 2003.  She had left and right heart cath in 2010 which showed pulmonary artery wedge pressure of 6 and PA pressure 22/9.  She had cardiac catheterization obtained on 09/07/2011 by Dr. Rex Kras which revealed normal left main, 20 to 30% ostial narrowing of left circumflex, normal RCA, luminal irregularities in the LAD without significant disease.  Previous echocardiogram in April 2017 demonstrated mild pulmonary hypertension, peak PA pressure 38 mmHg, EF was 60 to 65%.  Last echocardiogram obtained on 04/14/2017 showed EF 55 to 60%,  grade 1 DD, mild LVH, mild MR.  Myoview obtained on 05/14/2017 showed EF 74%, probable normal perfusion without significant ischemia or scar, overall considered low risk study.  Last carotid Doppler obtained in October 2019 showed 1 to 39% disease bilaterally.  Patient was contacted today via telephone visit.  She denies any recent chest pain however has been having some shortness of breath with exertion.  She has also gained about 7 pounds 2 weeks ago, this improved after a few days of the 80 mg daily of Lasix, however as soon as she go back to the alternating 80 and 40 mg Lasix every other day, she started gaining the weight right back.  I encouraged her to increase the Lasix back up to 80 mg daily and to continue on this dose.  I will obtain a 1 week basic metabolic panel.  Her blood pressure has also been running high.  Systolic blood pressure running in the 160-170s at home.  We discussed several options.  Her Benicar is already at the highest dose.  I will add carvedilol 3.125 mg twice daily to her medical regimen.  I plan to see her back in 1 to 2 weeks in the flex clinic for evaluation of lower extremity edema and blood pressure.  The patient does not have symptoms concerning for COVID-19 infection (fever, chills, cough, or new shortness of breath).    Past Medical History:  Diagnosis Date  . Anemia    hx of  . Anginal pain (HCC)    hx of  . Anxiety   . Depression   . Diabetes mellitus without complication (  HCC)   . GERD (gastroesophageal reflux disease)   . Headache(784.0)   . Heart murmur   . Hyperlipidemia   . Hypertension   . MRSA (methicillin resistant staph aureus) culture positive    many years ago-abdominal wound- no issuses since.  Sue Davidson PONV (postoperative nausea and vomiting)    severe  . Sleep apnea    Study done -remains under evaluation- no cpap yet.  . Vitamin D deficiency    Past Surgical History:  Procedure Laterality Date  . ABDOMINAL HYSTERECTOMY  1970's  .  APPENDECTOMY  1970's  . BREAST BIOPSY Left   . CARDIAC CATHETERIZATION    . CHOLECYSTECTOMY N/A 10/02/2013   Procedure: LAPAROSCOPIC CHOLECYSTECTOMY;  Surgeon: Earnstine Regal, MD;  Location: [facility];  Service: General;  Laterality: N/A;  . cyst removed Left    wrist"ganglion"  . ELBOW SURGERY Left   . HARDWARE REMOVAL Left    ankle  . KNEE ARTHROSCOPY Right 06/30/2016   Procedure: ARTHROSCOPY RIGHT KNEE WITH MEDIAL AND LATERAL MENSICAL DEBRIDEMENT;  Surgeon: Gaynelle Arabian, MD;  Location: [facility];  Service: Orthopedics;  Laterality: Right;  LMA  . LEG SURGERY Left 1980's   broke leg and ankle  . SHOULDER ARTHROSCOPY Bilateral   . TONSILLECTOMY  1970's  . TUBAL LIGATION  1970's     Current Meds  Medication Sig  . acetaminophen (TYLENOL) 500 MG tablet Take 500 mg by mouth every 6 (six) hours as needed for moderate pain.  Sue Davidson allopurinol (ZYLOPRIM) 100 MG tablet Take 100 mg by mouth 2 (two) times daily.  Sue Davidson aspirin 81 MG tablet Take 81 mg by mouth daily.  . cholecalciferol (VITAMIN D) 1000 units tablet Take 1,000 Units by mouth daily.  . furosemide (LASIX) 80 MG tablet Take 1 tablet (80 mg total) by mouth daily.  Sue Davidson HUMALOG MIX 75/25 (75-25) 100 UNIT/ML SUSP injection Inject into the skin 3 (three) times daily with meals. 70 units every morning. 15-25 units at lunch on a sliding scale depending on blood sugar. 85 units with supper.  . levothyroxine (SYNTHROID, LEVOTHROID) 75 MCG tablet Take 75 mcg by mouth daily before breakfast.  . olmesartan (BENICAR) 40 MG tablet TAKE 1 TABLET BY MOUTH EVERY DAY  . omeprazole (PRILOSEC) 40 MG capsule Take 40 mg by mouth daily. Reported on 01/19/2016  . ondansetron (ZOFRAN) 8 MG tablet Take 8 mg by mouth 2 (two) times daily as needed for nausea or vomiting.  . rosuvastatin (CRESTOR) 20 MG tablet Take 20 mg by mouth daily with breakfast.   . sertraline (ZOLOFT) 100 MG tablet Take 100 mg by mouth daily.  . [DISCONTINUED] furosemide (LASIX) 40 MG tablet Take 2  tablets (80 mg total) by mouth daily. Alternates taking 40mg  then 80mg .     Allergies:   Tape   Social History   Tobacco Use  . Smoking status: Never Smoker  . Smokeless tobacco: Never Used  Substance Use Topics  . Alcohol use: No    Alcohol/week: 0.0 standard drinks  . Drug use: No     Family Hx: The patient's family history includes Breast cancer in her paternal aunt; Cancer in her father, paternal aunt, and paternal aunt; Diabetes in her mother; Heart disease in her father and mother.  ROS:   Please see the history of present illness.     All other systems reviewed and are negative.   Prior CV studies:   The following studies were reviewed today:  Cath 09/07/2011    Echo  04/14/2017 LV EF: 55% -   60% Study Conclusions  - Left ventricle: The cavity size was normal. Wall thickness was   increased in a pattern of mild LVH. Systolic function was normal.   The estimated ejection fraction was in the range of 55% to 60%.   Wall motion was normal; there were no regional wall motion   abnormalities. Doppler parameters are consistent with abnormal   left ventricular relaxation (grade 1 diastolic dysfunction). - Aortic valve: Valve area (VTI): 1.63 cm^2. Valve area (Vmax):   1.57 cm^2. Valve area (Vmean): 1.37 cm^2. - Mitral valve: There was mild regurgitation. - Left atrium: The atrium was mildly dilated.   Myoview 05/13/2017 Study Highlights    Probable normal perfusion No significant ischemia or scar.  Nuclear stress EF: 74%.  Low risk study     Labs/Other Tests and Data Reviewed:    EKG:  An ECG dated 07/04/2018 was personally reviewed today and demonstrated:  Normal sinus rhythm with left anterior fascicular block Addendum: EKG was obtained on 07/03/2018, not 8/27  Recent Labs: 07/31/2018: BUN 17; Creatinine, Ser 0.89; Potassium 4.5; Sodium 140   Recent Lipid Panel Lab Results  Component Value Date/Time   CHOL 225 (H) 07/15/2011 06:53 AM   TRIG 186 (H)  07/15/2011 06:53 AM   HDL 30 (L) 07/15/2011 06:53 AM   CHOLHDL 7.5 07/15/2011 06:53 AM   LDLCALC 158 (H) 07/15/2011 06:53 AM    Wt Readings from Last 3 Encounters:  04/04/19 274 lb (124.3 kg)  07/31/18 276 lb 3.2 oz (125.3 kg)  07/03/18 280 lb (127 kg)     Objective:    Vital Signs:  BP (!) 146/82   Ht 5\' 6"  (1.676 m)   Wt 274 lb (124.3 kg)   BMI 44.22 kg/m    VITAL SIGNS:  reviewed  ASSESSMENT & PLAN:    1. Leg edema: I encouraged her to start taking Lasix 80 mg daily.  She previously lost some weight on this dose, however as soon as she went back to alternating 80 and 40 mg Lasix, her leg edema came back.  She will need 1 week basic metabolic panel in 1 to 2 weeks office visit for physical exam  2. Hypertension: Blood pressure remains elevated.  Her Benicar is already at the highest dose.  I will add a low-dose low-dose carvedilol 3.125 mg twice daily  3. Hyperlipidemia: On Crestor 20 mg daily  4. DM2: Managed by primary care provider  5. Hypothyroidism: On Synthroid, managed by PCP  6. Obstructive sleep apnea: On CPAP therapy.   COVID-19 Education: The signs and symptoms of COVID-19 were discussed with the patient and how to seek care for testing (follow up with PCP or arrange E-visit).  The importance of social distancing was discussed today.  Time:   Today, I have spent 14 minutes with the patient with telehealth technology discussing the above problems.     Medication Adjustments/Labs and Tests Ordered: Current medicines are reviewed at length with the patient today.  Concerns regarding medicines are outlined above.   Tests Ordered: Orders Placed This Encounter  Procedures  . Basic metabolic panel    Medication Changes: Meds ordered this encounter  Medications  . carvedilol (COREG) 3.125 MG tablet    Sig: Take 1 tablet (3.125 mg total) by mouth 2 (two) times daily with a meal.    Dispense:  60 tablet    Refill:  2  . furosemide (LASIX) 80 MG tablet  Sig: Take 1 tablet (80 mg total) by mouth daily.    Dispense:  90 tablet    Refill:  1    Disposition:  Follow up in 2 week(s)  Signed, Almyra Deforest, PA  04/06/2019 11:13 PM    [facility]

## 2019-04-17 NOTE — Progress Notes (Signed)
Cardiology Office Note   Date:  04/18/2019   ID:  Sue Davidson, DOB Oct 16, 1951, MRN [id number]  PCP:  Sue Pretty, MD  Cardiologist: Dr.Croitoru  CC: Hypertension follow up   History of Present Illness: Sue Davidson is a 68 y.o. female who presents for ongoing assessment and management of bilateral lower extremity edema, CAD but non-significant per cardiac cath 09/07/2011; hypertension, HL, with other history of DM2, OSA on CPAP, pulmonary hypertension, and hypothyroidism.   She was seen via telemedicine visit by Sue Deforest, PA on 04/04/2019 for complaints of worsening LEE. She was to take lasix 80 mg from 80 mg alternating with 40 mg QOD. Due to elevated BP, carvedilol 3.125 mg BID was added her medication regimen and she was to continue Benicar.  She is here for follow up concerning her response to medication changes. She states that she was not comfortable taking the coreg after reading the package insert. She continues on Benicar and Lasix. She has started on the Keto diet. She has lost 4 lbs since being seen last with use of lasix at higher dose temporarily. Edema is improved.   Past Medical History:  Diagnosis Date  . Anemia    hx of  . Anginal pain (HCC)    hx of  . Anxiety   . Depression   . Diabetes mellitus without complication ([place])   . GERD (gastroesophageal reflux disease)   . Headache(784.0)   . Heart murmur   . Hyperlipidemia   . Hypertension   . MRSA (methicillin resistant staph aureus) culture positive    many years ago-abdominal wound- no issuses since.  Sue Davidson PONV (postoperative nausea and vomiting)    severe  . Sleep apnea    Study done -remains under evaluation- no cpap yet.  . Vitamin D deficiency     Past Surgical History:  Procedure Laterality Date  . ABDOMINAL HYSTERECTOMY  1970's  . APPENDECTOMY  1970's  . BREAST BIOPSY Left   . CARDIAC CATHETERIZATION    . CHOLECYSTECTOMY N/A 10/02/2013   Procedure: LAPAROSCOPIC CHOLECYSTECTOMY;  Surgeon: Sue Regal,  MD;  Location: [facility];  Service: General;  Laterality: N/A;  . cyst removed Left    wrist"ganglion"  . ELBOW SURGERY Left   . HARDWARE REMOVAL Left    ankle  . KNEE ARTHROSCOPY Right 06/30/2016   Procedure: ARTHROSCOPY RIGHT KNEE WITH MEDIAL AND LATERAL MENSICAL DEBRIDEMENT;  Surgeon: Sue Arabian, MD;  Location: [facility];  Service: Orthopedics;  Laterality: Right;  LMA  . LEG SURGERY Left 1980's   broke leg and ankle  . SHOULDER ARTHROSCOPY Bilateral   . TONSILLECTOMY  1970's  . TUBAL LIGATION  1970's     Current Outpatient Medications  Medication Sig Dispense Refill  . acetaminophen (TYLENOL) 500 MG tablet Take 500 mg by mouth every 6 (six) hours as needed for moderate pain.    Sue Davidson allopurinol (ZYLOPRIM) 100 MG tablet Take 100 mg by mouth 2 (two) times daily.    Sue Davidson aspirin 81 MG tablet Take 81 mg by mouth daily.    . cholecalciferol (VITAMIN D) 1000 units tablet Take 1,000 Units by mouth daily.    . furosemide (LASIX) 80 MG tablet Take 1 tablet (80 mg total) by mouth daily. 90 tablet 1  . HUMALOG MIX 75/25 (75-25) 100 UNIT/ML SUSP injection Inject into the skin 3 (three) times daily with meals. 70 units every morning. 15-25 units at lunch on a sliding scale depending on blood sugar. 85 units with supper.    Sue Davidson  levothyroxine (SYNTHROID, LEVOTHROID) 75 MCG tablet Take 75 mcg by mouth daily before breakfast.    . olmesartan (BENICAR) 40 MG tablet TAKE 1 TABLET BY MOUTH EVERY DAY 90 tablet 2  . omeprazole (PRILOSEC) 40 MG capsule Take 40 mg by mouth daily. Reported on 01/19/2016    . ondansetron (ZOFRAN) 8 MG tablet Take 8 mg by mouth 2 (two) times daily as needed for nausea or vomiting.    . rosuvastatin (CRESTOR) 20 MG tablet Take 20 mg by mouth daily with breakfast.     . sertraline (ZOLOFT) 100 MG tablet Take 100 mg by mouth daily.    Sue Davidson amLODipine (NORVASC) 2.5 MG tablet Take 1 tablet (2.5 mg total) by mouth daily for 30 days. 30 tablet 3   No current facility-administered medications  for this visit.     Allergies:   Tape    Social History:  The patient  reports that she has never smoked. She has never used smokeless tobacco. She reports that she does not drink alcohol or use drugs.   Family History:  The patient's family history includes Breast cancer in her paternal aunt; Cancer in her father, paternal aunt, and paternal aunt; Diabetes in her mother; Heart disease in her father and mother.    ROS: All other systems are reviewed and negative. Unless otherwise mentioned in H&P    PHYSICAL EXAM: VS:  BP (!) 148/72   Pulse 83   Temp (!) 97.2 F (36.2 C)   Ht 5\' 6"  (1.676 m)   Wt 272 lb (123.4 kg)   SpO2 92%   BMI 43.90 kg/m  , BMI Body mass index is 43.9 kg/m. GEN: Well nourished, well developed, in no acute distress-obese  HEENT: normal Neck: no JVD, radiation carotid bruits on the right, or masses Cardiac: RRR; 1/6 systolic murmurs, rubs, or gallops,1+ left foot edema  Respiratory:  Clear to auscultation bilaterally, normal work of breathing GI: soft, nontender, nondistended, + BS MS: no deformity or atrophy Skin: warm and dry, no rash Neuro:  Strength and sensation are intact Psych: euthymic mood, full affect   EKG: Not completed today.   Recent Labs: 07/31/2018: BUN 17; Creatinine, Ser 0.89; Potassium 4.5; Sodium 140    Lipid Panel    Component Value Date/Time   CHOL 225 (H) 07/15/2011 0653   TRIG 186 (H) 07/15/2011 0653   HDL 30 (L) 07/15/2011 0653   CHOLHDL 7.5 07/15/2011 0653   VLDL 37 07/15/2011 0653   LDLCALC 158 (H) 07/15/2011 0653      Wt Readings from Last 3 Encounters:  04/18/19 272 lb (123.4 kg)  04/04/19 274 lb (124.3 kg)  07/31/18 276 lb 3.2 oz (125.3 kg)      Other studies Reviewed: Cath 09/07/2011    Echo 04/14/2017 LV EF: 55% - 60% Study Conclusions  - Left ventricle: The cavity size was normal. Wall thickness was increased in a pattern of mild LVH. Systolic function was normal. The estimated ejection  fraction was in the range of 55% to 60%. Wall motion was normal; there were no regional wall motion abnormalities. Doppler parameters are consistent with abnormal left ventricular relaxation (grade 1 diastolic dysfunction). - Aortic valve: Valve area (VTI): 1.63 cm^2. Valve area (Vmax): 1.57 cm^2. Valve area (Vmean): 1.37 cm^2. - Mitral valve: There was mild regurgitation. - Left atrium: The atrium was mildly dilated.   Myoview 05/13/2017 Study Highlights    Probable normal perfusion No significant ischemia or scar.  Nuclear stress EF: 74%.  Low risk study    ASSESSMENT AND PLAN:  1. Hypertension:  Blood pressure remains elevated today. She states that she was uncomfortable taking the coreg and did not take this. She states BP is elevated in the am before she takes her medications but takes it again at work. BP ranging in the 773-736 systolic when she takes it at work.  I will start her on low dose amlodipine 2.5 mg daily. I have explained to her that she may notice a bit of LEE with use of this medication. She continues on lasix which may be helpful in minimizing this. She is to wear support hose as well. Will see her back on one month for evaluation. She is to take her BP daily and record.   2. Obesity: She is now on the Keto diet. I have cautioned her on fat and salt that can be prominent with this diet, affecting her BP and fluids. She verbalizes understanding. She is to increase activity to allow for calorie expenditure. She is limited on some exercises due to knees. She will do her best.   3. Hypercholesterolemia; Continue rosuvastatin as directed. Follow up labs per PCP.   4. Diabetes: Followed by PCP.   Current medicines are reviewed at length with the patient today.    Labs/ tests ordered today include: None   Phill Myron. West Pugh, ANP, AACC   04/18/2019 9:44 AM    [place] [address] Office [phone] Fax  [phone]

## 2019-04-18 ENCOUNTER — Other Ambulatory Visit: Payer: Self-pay

## 2019-04-18 ENCOUNTER — Ambulatory Visit: Payer: 59 | Admitting: Adult Health

## 2019-04-18 ENCOUNTER — Encounter: Payer: Self-pay | Admitting: Adult Health

## 2019-04-18 VITALS — BP 148/72 | HR 83 | Temp 97.2°F | Ht 66.0 in | Wt 272.0 lb

## 2019-04-18 DIAGNOSIS — I10 Essential (primary) hypertension: Secondary | ICD-10-CM | POA: Diagnosis not present

## 2019-04-18 DIAGNOSIS — Z9989 Dependence on other enabling machines and devices: Secondary | ICD-10-CM

## 2019-04-18 DIAGNOSIS — I2721 Secondary pulmonary arterial hypertension: Secondary | ICD-10-CM | POA: Diagnosis not present

## 2019-04-18 DIAGNOSIS — G4733 Obstructive sleep apnea (adult) (pediatric): Secondary | ICD-10-CM

## 2019-04-18 DIAGNOSIS — I358 Other nonrheumatic aortic valve disorders: Secondary | ICD-10-CM

## 2019-04-18 DIAGNOSIS — E119 Type 2 diabetes mellitus without complications: Secondary | ICD-10-CM | POA: Diagnosis not present

## 2019-04-18 MED ORDER — AMLODIPINE BESYLATE 2.5 MG PO TABS: 2.5000 mg | ORAL_TABLET | Freq: Every day | ORAL | 3 refills | Status: DC

## 2019-04-18 NOTE — Progress Notes (Signed)
I wonder what it was in the carvedilol package insert that scared her off... MCr

## 2019-04-18 NOTE — Patient Instructions (Signed)
Medication Instructions:  START AMLODIPINE 2.5MG  DAILY If you need a refill on your cardiac medications before your next appointment, please call your pharmacy.  Special Instructions: TAKE AND LOG BP DAILY  Follow-Up: You will need a follow up appointment in 1 months. You may see Sanda Klein, MD -OR- KATHRYN LAWRENCE DNP,AACC IF PRIMARY CARDIOLOGIST IS UNAVAILABLE or one of the following Advanced Practice Providers on your designated Care Team: Almyra Deforest, [place] . Fabian Sharp, PA-C       At [facility], you and your health needs are our priority.  As part of our continuing mission to provide you with exceptional heart care, we have created designated Provider Care Teams.  These Care Teams include your primary Cardiologist (physician) and Advanced Practice Providers (APPs -  Physician Assistants and Nurse Practitioners) who all work together to provide you with the care you need, when you need it.  Thank you for choosing [facility] at [facility]!!

## 2019-05-04 ENCOUNTER — Other Ambulatory Visit: Payer: Self-pay | Admitting: Internal Medicine

## 2019-05-04 DIAGNOSIS — Z1231 Encounter for screening mammogram for malignant neoplasm of breast: Secondary | ICD-10-CM

## 2019-05-14 NOTE — Progress Notes (Signed)
Cardiology Office Note   Date:  05/14/2019   ID:  Sue Davidson, DOB 01/18/1951, MRN [id number]  PCP:  Sue Pretty, MD  Cardiologist:  Dr.  Sallyanne Kuster  CC: Follow Up: HTN   History of Present Illness: Sue Davidson is a 68 y.o. female who presents for ongoing assessment and management of bilateral lower extremity edema, CAD but non-significant per cardiac cath 09/07/2011; hypertension, HL, with other history of DM2, OSA on CPAP, pulmonary hypertension, and hypothyroidism.   On last office visit, BP was not well controlled. I added amlodipine 2.5 mg daily, to lasix which she takes for chronic LEE. She  She was continued on Benicar.  She comes today with blood pressure readings, over the last month her blood pressures been ranging from the highest of 163/75 to a lowest of 122/87.  She is averaging about 152/80.Marland Kitchen Heart rate is been in the 70s and 80s.  She normally takes her blood pressure first thing in the morning before taking her medication.  She states that she is compliant with her CPAP as she has had a new adjustment to her mask.    Past Medical History:  Diagnosis Date  . Anemia    hx of  . Anginal pain (HCC)    hx of  . Anxiety   . Depression   . Diabetes mellitus without complication ([place])   . GERD (gastroesophageal reflux disease)   . Headache(784.0)   . Heart murmur   . Hyperlipidemia   . Hypertension   . MRSA (methicillin resistant staph aureus) culture positive    many years ago-abdominal wound- no issuses since.  Marland Kitchen PONV (postoperative nausea and vomiting)    severe  . Sleep apnea    Study done -remains under evaluation- no cpap yet.  . Vitamin D deficiency     Past Surgical History:  Procedure Laterality Date  . ABDOMINAL HYSTERECTOMY  1970's  . APPENDECTOMY  1970's  . BREAST BIOPSY Left   . CARDIAC CATHETERIZATION    . CHOLECYSTECTOMY N/A 10/02/2013   Procedure: LAPAROSCOPIC CHOLECYSTECTOMY;  Surgeon: Earnstine Regal, MD;  Location: [facility];  Service: General;   Laterality: N/A;  . cyst removed Left    wrist"ganglion"  . ELBOW SURGERY Left   . HARDWARE REMOVAL Left    ankle  . KNEE ARTHROSCOPY Right 06/30/2016   Procedure: ARTHROSCOPY RIGHT KNEE WITH MEDIAL AND LATERAL MENSICAL DEBRIDEMENT;  Surgeon: Gaynelle Arabian, MD;  Location: [facility];  Service: Orthopedics;  Laterality: Right;  LMA  . LEG SURGERY Left 1980's   broke leg and ankle  . SHOULDER ARTHROSCOPY Bilateral   . TONSILLECTOMY  1970's  . TUBAL LIGATION  1970's     Current Outpatient Medications  Medication Sig Dispense Refill  . acetaminophen (TYLENOL) 500 MG tablet Take 500 mg by mouth every 6 (six) hours as needed for moderate pain.    Marland Kitchen allopurinol (ZYLOPRIM) 100 MG tablet Take 100 mg by mouth 2 (two) times daily.    Marland Kitchen amLODipine (NORVASC) 2.5 MG tablet Take 1 tablet (2.5 mg total) by mouth daily for 30 days. 30 tablet 3  . aspirin 81 MG tablet Take 81 mg by mouth daily.    . cholecalciferol (VITAMIN D) 1000 units tablet Take 1,000 Units by mouth daily.    . furosemide (LASIX) 80 MG tablet Take 1 tablet (80 mg total) by mouth daily. 90 tablet 1  . HUMALOG MIX 75/25 (75-25) 100 UNIT/ML SUSP injection Inject into the skin 3 (three) times daily with  meals. 70 units every morning. 15-25 units at lunch on a sliding scale depending on blood sugar. 85 units with supper.    . levothyroxine (SYNTHROID, LEVOTHROID) 75 MCG tablet Take 75 mcg by mouth daily before breakfast.    . olmesartan (BENICAR) 40 MG tablet TAKE 1 TABLET BY MOUTH EVERY DAY 90 tablet 2  . omeprazole (PRILOSEC) 40 MG capsule Take 40 mg by mouth daily. Reported on 01/19/2016    . ondansetron (ZOFRAN) 8 MG tablet Take 8 mg by mouth 2 (two) times daily as needed for nausea or vomiting.    . rosuvastatin (CRESTOR) 20 MG tablet Take 20 mg by mouth daily with breakfast.     . sertraline (ZOLOFT) 100 MG tablet Take 100 mg by mouth daily.     No current facility-administered medications for this visit.     Allergies:   Tape     Social History:  The patient  reports that she has never smoked. She has never used smokeless tobacco. She reports that she does not drink alcohol or use drugs.   Family History:  The patient's family history includes Breast cancer in her paternal aunt; Cancer in her father, paternal aunt, and paternal aunt; Diabetes in her mother; Heart disease in her father and mother.    ROS: All other systems are reviewed and negative. Unless otherwise mentioned in H&P    PHYSICAL EXAM: VS:  There were no vitals taken for this visit. , BMI There is no height or weight on file to calculate BMI. GEN: Well nourished, well developed, in no acute distress HEENT: normal Neck: no JVD, carotid bruits, or masses Cardiac: RRR; 1/6 systolic murmurs, rubs, or gallops,no edema  Respiratory:  Clear to auscultation bilaterally, normal work of breathing GI: soft, nontender, nondistended, + BS MS: no deformity or atrophy Skin: warm and dry, no rash Neuro:  Strength and sensation are intact Psych: euthymic mood, full affect   EKG: Sinus rhythm/sinus tach heart rate of 89 bpm, left ventricular hypertrophy is noted with left anterior fascicular block.  Unchanged from prior EKG in June 2020.  Recent Labs: 07/31/2018: BUN 17; Creatinine, Ser 0.89; Potassium 4.5; Sodium 140    Lipid Panel    Component Value Date/Time   CHOL 225 (H) 07/15/2011 0653   TRIG 186 (H) 07/15/2011 0653   HDL 30 (L) 07/15/2011 0653   CHOLHDL 7.5 07/15/2011 0653   VLDL 37 07/15/2011 0653   LDLCALC 158 (H) 07/15/2011 0653      Wt Readings from Last 3 Encounters:  04/18/19 272 lb (123.4 kg)  04/04/19 274 lb (124.3 kg)  07/31/18 276 lb 3.2 oz (125.3 kg)      Other studies Reviewed: Cath 09/07/2011    Echo 04/14/2017 LV EF: 55% - 60% Study Conclusions  - Left ventricle: The cavity size was normal. Wall thickness was increased in a pattern of mild LVH. Systolic function was normal. The estimated ejection fraction was  in the range of 55% to 60%. Wall motion was normal; there were no regional wall motion abnormalities. Doppler parameters are consistent with abnormal left ventricular relaxation (grade 1 diastolic dysfunction). - Aortic valve: Valve area (VTI): 1.63 cm^2. Valve area (Vmax): 1.57 cm^2. Valve area (Vmean): 1.37 cm^2. - Mitral valve: There was mild regurgitation. - Left atrium: The atrium was mildly dilated.   Myoview 05/13/2017 Study Highlights    Probable normal perfusion No significant ischemia or scar.  Nuclear stress EF: 74%.  Low risk study  ASSESSMENT AND PLAN:  1.  Difficult to control hypertension.  She is on Lasix, Benicar 80 mg daily, and amlodipine 2.5 mg daily.  I will increase her amlodipine to 5 mg daily for better control.  She does have some mild dyspnea on exertion which is likely related to her weight.  She continues to wear CPAP as directed and states that she is sleeping well with mask adjustment which is been completed this week.  She will continue to write down her blood pressures daily along with her heart rate.  I did recheck her blood pressure in the office clinic using a large blood pressure cuff.  Blood pressure was 156/78.  This is still not optimal for a patient with diabetes and coronary artery disease.  We will see her again in 6 weeks.  2.  Nonobstructive coronary artery disease: Secondary prevention is continued.   3.  Diabetes: Followed by primary care.  4.  Hypercholesterolemia: Continue rosuvastatin 20 mg daily.  She will need fasting lipids and LFTs on next office visit if not completed by primary care.  Most recent documentation of cholesterol status was in 2012.   Current medicines are reviewed at length with the patient today.    Labs/ tests ordered today include: None  Phill Myron. West Pugh, ANP, AACC   05/14/2019 1:53 PM    [place] [address] Office [phone] Fax [phone]

## 2019-05-15 ENCOUNTER — Telehealth: Payer: Self-pay | Admitting: Adult Health

## 2019-05-15 NOTE — Telephone Encounter (Signed)

## 2019-05-16 ENCOUNTER — Other Ambulatory Visit: Payer: Self-pay

## 2019-05-16 ENCOUNTER — Encounter: Payer: Self-pay | Admitting: Adult Health

## 2019-05-16 ENCOUNTER — Ambulatory Visit: Payer: 59 | Admitting: Adult Health

## 2019-05-16 VITALS — BP 179/80 | HR 89 | Ht 66.0 in | Wt 273.0 lb

## 2019-05-16 DIAGNOSIS — E7800 Pure hypercholesterolemia, unspecified: Secondary | ICD-10-CM | POA: Diagnosis not present

## 2019-05-16 DIAGNOSIS — I10 Essential (primary) hypertension: Secondary | ICD-10-CM

## 2019-05-16 DIAGNOSIS — I2510 Atherosclerotic heart disease of native coronary artery without angina pectoris: Secondary | ICD-10-CM

## 2019-05-16 MED ORDER — AMLODIPINE BESYLATE 5 MG PO TABS: 5.0000 mg | ORAL_TABLET | Freq: Every day | ORAL | 3 refills | Status: DC

## 2019-05-16 NOTE — Patient Instructions (Signed)
Medication Instructions:  INCREASE- Amlodipine 5 mg daily  If you need a refill on your cardiac medications before your next appointment, please call your pharmacy.  Labwork: None Ordered   Testing/Procedures: None Ordered  Follow-Up: . Your physician recommends that you schedule a follow-up appointment in: [place], you and your health needs are our priority.  As part of our continuing mission to provide you with exceptional heart care, we have created designated Provider Care Teams.  These Care Teams include your primary Cardiologist (physician) and Advanced Practice Providers (APPs -  Physician Assistants and Nurse Practitioners) who all work together to provide you with the care you need, when you need it.  Thank you for choosing [facility] at [facility]!!

## 2019-06-15 ENCOUNTER — Ambulatory Visit: Payer: 59

## 2019-06-27 ENCOUNTER — Ambulatory Visit: Payer: 59 | Admitting: Adult Health

## 2019-06-29 ENCOUNTER — Other Ambulatory Visit: Payer: Self-pay | Admitting: Physician Assistant

## 2019-07-19 NOTE — Progress Notes (Deleted)
Cardiology Office Note   Date:  07/19/2019   ID:  Sue Davidson, DOB 1951-08-05, MRN [id number]  PCP:  Deland Pretty, MD  Cardiologist:  Dr.Croitoru  No chief complaint on file.    History of Present Illness: Sue Davidson is a 68 y.o. female who presents for ongoing assessment and management of bilateral LEE, non-obstructive CAD, hypertension, and hyperlipidemia. Other history of DM2, OSA on CPAP, pulmonary HTN, hypothyroidism. She is keeping up with her BP at home.   At last office om 05/16/2019 BP was not well controlled. I increased amlodipine to 5 mg. Optimal BP goal is 120/60 with diabetes. Will need to have labs drawn today Lipids and LFT's, along with BMET.     Past Medical History:  Diagnosis Date  . Anemia    hx of  . Anginal pain (HCC)    hx of  . Anxiety   . Depression   . Diabetes mellitus without complication ([place])   . GERD (gastroesophageal reflux disease)   . Headache(784.0)   . Heart murmur   . Hyperlipidemia   . Hypertension   . MRSA (methicillin resistant staph aureus) culture positive    many years ago-abdominal wound- no issuses since.  Marland Kitchen PONV (postoperative nausea and vomiting)    severe  . Sleep apnea    Study done -remains under evaluation- no cpap yet.  . Vitamin D deficiency     Past Surgical History:  Procedure Laterality Date  . ABDOMINAL HYSTERECTOMY  1970's  . APPENDECTOMY  1970's  . BREAST BIOPSY Left   . CARDIAC CATHETERIZATION    . CHOLECYSTECTOMY N/A 10/02/2013   Procedure: LAPAROSCOPIC CHOLECYSTECTOMY;  Surgeon: Earnstine Regal, MD;  Location: [facility];  Service: General;  Laterality: N/A;  . cyst removed Left    wrist"ganglion"  . ELBOW SURGERY Left   . HARDWARE REMOVAL Left    ankle  . KNEE ARTHROSCOPY Right 06/30/2016   Procedure: ARTHROSCOPY RIGHT KNEE WITH MEDIAL AND LATERAL MENSICAL DEBRIDEMENT;  Surgeon: Gaynelle Arabian, MD;  Location: [facility];  Service: Orthopedics;  Laterality: Right;  LMA  . LEG SURGERY Left 1980's   broke leg and  ankle  . SHOULDER ARTHROSCOPY Bilateral   . TONSILLECTOMY  1970's  . TUBAL LIGATION  1970's     Current Outpatient Medications  Medication Sig Dispense Refill  . acetaminophen (TYLENOL) 500 MG tablet Take 500 mg by mouth every 6 (six) hours as needed for moderate pain.    Marland Kitchen allopurinol (ZYLOPRIM) 100 MG tablet Take 100 mg by mouth 2 (two) times daily.    Marland Kitchen amLODipine (NORVASC) 5 MG tablet Take 1 tablet (5 mg total) by mouth daily. 90 tablet 3  . aspirin 81 MG tablet Take 81 mg by mouth daily.    . cholecalciferol (VITAMIN D) 1000 units tablet Take 1,000 Units by mouth daily.    . furosemide (LASIX) 80 MG tablet Take 1 tablet (80 mg total) by mouth daily. 90 tablet 1  . HUMALOG MIX 75/25 (75-25) 100 UNIT/ML SUSP injection Inject into the skin 3 (three) times daily with meals. 70 units every morning. 15-25 units at lunch on a sliding scale depending on blood sugar. 85 units with supper.    . levothyroxine (SYNTHROID, LEVOTHROID) 75 MCG tablet Take 75 mcg by mouth daily before breakfast.    . olmesartan (BENICAR) 40 MG tablet TAKE 1 TABLET BY MOUTH EVERY DAY 90 tablet 2  . omeprazole (PRILOSEC) 40 MG capsule Take 40 mg by mouth daily. Reported on  01/19/2016    . ondansetron (ZOFRAN) 8 MG tablet Take 8 mg by mouth 2 (two) times daily as needed for nausea or vomiting.    . rosuvastatin (CRESTOR) 20 MG tablet Take 20 mg by mouth daily with breakfast.     . sertraline (ZOLOFT) 100 MG tablet Take 100 mg by mouth daily.     No current facility-administered medications for this visit.     Allergies:   Tape    Social History:  The patient  reports that she has never smoked. She has never used smokeless tobacco. She reports that she does not drink alcohol or use drugs.   Family History:  The patient's family history includes Breast cancer in her paternal aunt; Cancer in her father, paternal aunt, and paternal aunt; Diabetes in her mother; Heart disease in her father and mother.    ROS: All  other systems are reviewed and negative. Unless otherwise mentioned in H&P    PHYSICAL EXAM: VS:  There were no vitals taken for this visit. , BMI There is no height or weight on file to calculate BMI. GEN: Well nourished, well developed, in no acute distress HEENT: normal Neck: no JVD, carotid bruits, or masses Cardiac: ***RRR; no murmurs, rubs, or gallops,no edema  Respiratory:  Clear to auscultation bilaterally, normal work of breathing GI: soft, nontender, nondistended, + BS MS: no deformity or atrophy Skin: warm and dry, no rash Neuro:  Strength and sensation are intact Psych: euthymic mood, full affect   EKG:  EKG {ACTION; IS/IS [id number] ordered today. The ekg ordered today demonstrates ***   Recent Labs: 07/31/2018: BUN 17; Creatinine, Ser 0.89; Potassium 4.5; Sodium 140    Lipid Panel    Component Value Date/Time   CHOL 225 (H) 07/15/2011 0653   TRIG 186 (H) 07/15/2011 0653   HDL 30 (L) 07/15/2011 0653   CHOLHDL 7.5 07/15/2011 0653   VLDL 37 07/15/2011 0653   LDLCALC 158 (H) 07/15/2011 0653      Wt Readings from Last 3 Encounters:  05/16/19 273 lb (123.8 kg)  04/18/19 272 lb (123.4 kg)  04/04/19 274 lb (124.3 kg)      Other studies Reviewed: Cath 09/07/2011    Echo 04/14/2017 LV EF: 55% - 60% Study Conclusions  - Left ventricle: The cavity size was normal. Wall thickness was increased in a pattern of mild LVH. Systolic function was normal. The estimated ejection fraction was in the range of 55% to 60%. Wall motion was normal; there were no regional wall motion abnormalities. Doppler parameters are consistent with abnormal left ventricular relaxation (grade 1 diastolic dysfunction). - Aortic valve: Valve area (VTI): 1.63 cm^2. Valve area (Vmax): 1.57 cm^2. Valve area (Vmean): 1.37 cm^2. - Mitral valve: There was mild regurgitation. - Left atrium: The atrium was mildly dilated.   Myoview 05/13/2017 Study Highlights     Probable normal perfusion No significant ischemia or scar.  Nuclear stress EF: 74%.  Low risk study      ASSESSMENT AND PLAN:  1.  ***   Current medicines are reviewed at length with the patient today.    Labs/ tests ordered today include: *** Phill Myron. West Pugh, ANP, [facility]   07/19/2019 8:37 AM    [facility] [address] Office [phone] Fax [phone]

## 2019-07-20 ENCOUNTER — Ambulatory Visit: Payer: 59

## 2019-07-23 ENCOUNTER — Ambulatory Visit: Payer: 59 | Admitting: Adult Health

## 2019-08-01 ENCOUNTER — Telehealth: Payer: Self-pay | Admitting: Adult Health

## 2019-08-01 DIAGNOSIS — I10 Essential (primary) hypertension: Secondary | ICD-10-CM

## 2019-08-01 DIAGNOSIS — R609 Edema, unspecified: Secondary | ICD-10-CM

## 2019-08-01 DIAGNOSIS — R0602 Shortness of breath: Secondary | ICD-10-CM

## 2019-08-01 NOTE — Telephone Encounter (Signed)
Continue taking lasix 80 mg in the am.  Make sure its is on an empty stomach as this allows for better absorbency.  She will need a BMET prior to her appt and BNP.  Thank you.

## 2019-08-01 NOTE — Telephone Encounter (Signed)
Pt c/o swelling: STAT is pt has developed SOB within 24 hours  1) How much weight have you gained and in what time span?  2) If swelling, where is the swelling located? Legs and feet. R foot swells more than her L  3) Are you currently taking a fluid pill? yes  4) Are you currently SOB? Only after walking for an extended time.  5) Do you have a log of your daily weights (if so, list)?     6) Have you gained 3 pounds in a day or 5 pounds in a week?   7) Have you traveled recently? No   Patient first noted a 13 pound weight gain from her usual weight on 09/14. By Friday 09/18, she was only up 3-5 pounds from her normal weight. During this time, she took extra fluid pills and tried her best to stay off of her feet. Saturday she took the normal dose of her fluid pill and continued to stay off of her feet. Monday 09/21 she was still up 3 pounds from her usual weight. She has been trying to avoid salt as best as she can. She also tries to walk to move the fluid, but after a bit of walking she becomes SOB and has to stop to rest. She can not seem to drop the last 3 pounds of fluid. She states that her R leg is a bit more swollen than her L.  She has an appointment coming up with Curt Bears on Monday 10/12 at 8:15 am. Patient would like to know what to do in the mean time before her office visit

## 2019-08-01 NOTE — Telephone Encounter (Signed)
Returned call to patient Sue Sims DNP advice given.Stated she will have bmet and bnp on Fri 9/25.

## 2019-08-01 NOTE — Telephone Encounter (Signed)
Returned call to patient.She stated she wanted Sue Davidson to know for the past week she has increased swelling in both lower legs,sob.Stated she gained 13 lbs within 1 week.Stated she took a extra Lasix 20 mg in pm for 2 days and she lost 10 lbs.She is currently taking Lasix 80 mg in am.She is unable to wear compression hose they are too tight and cut into legs.She keeps legs elevated while sitting.She is on a low salt diet.Appointment scheduled with Sue Davidson 08/06/19 at 2:15 pm.Advised I will send message to Curt Bears to get her advice if she needs to take more Lasix.

## 2019-08-03 LAB — BASIC METABOLIC PANEL
BUN/Creatinine Ratio: 18 (ref 12–28)
BUN: 19 mg/dL (ref 8–27)
CO2: 30 mmol/L — ABNORMAL HIGH (ref 20–29)
Calcium: 9.5 mg/dL (ref 8.7–10.3)
Chloride: 97 mmol/L (ref 96–106)
Creatinine, Ser: 1.03 mg/dL — ABNORMAL HIGH (ref 0.57–1.00)
GFR calc Af Amer: 64 mL/min/{1.73_m2} (ref >59)
GFR calc non Af Amer: 56 mL/min/{1.73_m2} — ABNORMAL LOW (ref >59)
Glucose: 219 mg/dL — ABNORMAL HIGH (ref 65–99)
Potassium: 4 mmol/L (ref 3.5–5.2)
Sodium: 140 mmol/L (ref 134–144)

## 2019-08-04 LAB — BRAIN NATRIURETIC PEPTIDE: BNP: 34.8 pg/mL (ref 0.0–100.0)

## 2019-08-05 NOTE — Progress Notes (Addendum)
Cardiology Office Note   Date:  08/06/2019   ID:  Sue Davidson, DOB 1951-03-03, MRN [id number]  PCP:  Deland Pretty, MD  Cardiologist: Dr. Sallyanne Kuster  CC:    History of Present Illness: Sue Davidson is a 68 y.o. female who presents for ongoing assessment and management of bilateral lower extremity edema, history of CAD but not significant for cardiac catheterization in 2012, hypertension, and hyperlipidemia.  She has other history of diabetes type 2, OSA on CPAP, pulmonary hypertension, and hypothyroidism.  On last office visit in July 2020 she was found to have continued elevation of blood pressure.  On that visit she was continued on Lasix, Benicar 80 mg daily and I increased her amlodipine to 5 mg daily.  It was felt that some of her dyspnea on exertion which she complained of was related to obesity.  She was advised to continue to wear CPAP as directed.  She was to keep recordings of her blood pressures along with her heart rate.  Optimal blood pressure was explained to her at 120/60.  She verbalizes understanding.  May need to continue to titrate up her medications.  Follow-up BMET was ordered.  BMET revealed elevated glucose of 219, creatinine elevated at 1.03.  Otherwise normal values.  She comes today with complaints of worsening lower extremity edema, and weight gain.  She admits to eating some salty foods at home.  She is seeing her endocrinologist, Dr. Criss Rosales, and has been placed on Jardiance.  The patient's hemoglobin A1c was 10.  The patient admits to not taking very good care of herself since her husband died.  She is having trouble adjusting to life without him and has not cared for her own health very well.  She speaks at length about how much she misses him.  She has decided to seek counseling to help her with her grief.  Her daughter is a big support for her and they are beginning a walking program.  She is also researching diabetic diets and low-sodium diets.  She denies chest pain, she  has some mild dyspnea on exertion associated with her walking regimen.  Past Medical History:  Diagnosis Date  . Anemia    hx of  . Anginal pain (HCC)    hx of  . Anxiety   . Depression   . Diabetes mellitus without complication ([place])   . GERD (gastroesophageal reflux disease)   . Headache(784.0)   . Heart murmur   . Hyperlipidemia   . Hypertension   . MRSA (methicillin resistant staph aureus) culture positive    many years ago-abdominal wound- no issuses since.  Marland Kitchen PONV (postoperative nausea and vomiting)    severe  . Sleep apnea    Study done -remains under evaluation- no cpap yet.  . Vitamin D deficiency     Past Surgical History:  Procedure Laterality Date  . ABDOMINAL HYSTERECTOMY  1970's  . APPENDECTOMY  1970's  . BREAST BIOPSY Left   . CARDIAC CATHETERIZATION    . CHOLECYSTECTOMY N/A 10/02/2013   Procedure: LAPAROSCOPIC CHOLECYSTECTOMY;  Surgeon: Earnstine Regal, MD;  Location: [facility];  Service: General;  Laterality: N/A;  . cyst removed Left    wrist"ganglion"  . ELBOW SURGERY Left   . HARDWARE REMOVAL Left    ankle  . KNEE ARTHROSCOPY Right 06/30/2016   Procedure: ARTHROSCOPY RIGHT KNEE WITH MEDIAL AND LATERAL MENSICAL DEBRIDEMENT;  Surgeon: Gaynelle Arabian, MD;  Location: [facility];  Service: Orthopedics;  Laterality: Right;  LMA  .  LEG SURGERY Left 1980's   broke leg and ankle  . SHOULDER ARTHROSCOPY Bilateral   . TONSILLECTOMY  1970's  . TUBAL LIGATION  1970's     Current Outpatient Medications  Medication Sig Dispense Refill  . acetaminophen (TYLENOL) 500 MG tablet Take 500 mg by mouth every 6 (six) hours as needed for moderate pain.    Marland Kitchen allopurinol (ZYLOPRIM) 100 MG tablet Take 100 mg by mouth 2 (two) times daily.    Marland Kitchen amLODipine (NORVASC) 5 MG tablet Take 1 tablet (5 mg total) by mouth daily. 90 tablet 3  . aspirin 81 MG tablet Take 81 mg by mouth daily.    . cholecalciferol (VITAMIN D) 1000 units tablet Take 1,000 Units by mouth daily.    . furosemide  (LASIX) 80 MG tablet Take 1 tablet (80 mg total) by mouth daily. 90 tablet 1  . HUMALOG MIX 75/25 (75-25) 100 UNIT/ML SUSP injection Inject into the skin 3 (three) times daily with meals. 70 units every morning. 15-25 units at lunch on a sliding scale depending on blood sugar. 85 units with supper.    . levothyroxine (SYNTHROID, LEVOTHROID) 75 MCG tablet Take 75 mcg by mouth daily before breakfast.    . olmesartan (BENICAR) 40 MG tablet TAKE 1 TABLET BY MOUTH EVERY DAY 90 tablet 2  . omeprazole (PRILOSEC) 40 MG capsule Take 40 mg by mouth daily. Reported on 01/19/2016    . ondansetron (ZOFRAN) 8 MG tablet Take 8 mg by mouth 2 (two) times daily as needed for nausea or vomiting.    . rosuvastatin (CRESTOR) 20 MG tablet Take 20 mg by mouth daily with breakfast.     . sertraline (ZOLOFT) 100 MG tablet Take 100 mg by mouth daily.     No current facility-administered medications for this visit.     Allergies:   Tape    Social History:  The patient  reports that she has never smoked. She has never used smokeless tobacco. She reports that she does not drink alcohol or use drugs.   Family History:  The patient's family history includes Breast cancer in her paternal aunt; Cancer in her father, paternal aunt, and paternal aunt; Diabetes in her mother; Heart disease in her father and mother.    ROS: All other systems are reviewed and negative. Unless otherwise mentioned in H&P    PHYSICAL EXAM: VS:  BP (!) 170/72   Pulse 95   Ht 5\' 6"  (1.676 m)   Wt 280 lb 9.6 oz (127.3 kg)   SpO2 95%   BMI 45.29 kg/m  , BMI Body mass index is 45.29 kg/m. GEN: Well nourished, well developed, in no acute distress HEENT: normal Neck: no JVD, carotid bruits, or masses Cardiac: RRR; 2/6 systolic murmurs, rubs, or gallops,non pitting dependent edema venous stasis skin changes. Respiratory:  Clear to auscultation bilaterally, normal work of breathing GI: soft, nontender, nondistended, + BS MS: no deformity or  atrophy Skin: warm and dry, no rash Neuro:  Strength and sensation are intact Psych: euthymic mood, full affect, but becomes tearful when speaking of her husband's death.   EKG: Not completed this office visit.  Recent Labs: 08/03/2019: BNP 34.8; BUN 19; Creatinine, Ser 1.03; Potassium 4.0; Sodium 140    Lipid Panel    Component Value Date/Time   CHOL 225 (H) 07/15/2011 0653   TRIG 186 (H) 07/15/2011 0653   HDL 30 (L) 07/15/2011 0653   CHOLHDL 7.5 07/15/2011 0653   VLDL 37 07/15/2011 [id number]  LDLCALC 158 (H) 07/15/2011 0653      Wt Readings from Last 3 Encounters:  08/06/19 280 lb 9.6 oz (127.3 kg)  05/16/19 273 lb (123.8 kg)  04/18/19 272 lb (123.4 kg)      Other studies Reviewed: Cath 09/07/2011    Echo 04/14/2017 LV EF: 55% - 60% Study Conclusions  - Left ventricle: The cavity size was normal. Wall thickness was increased in a pattern of mild LVH. Systolic function was normal. The estimated ejection fraction was in the range of 55% to 60%. Wall motion was normal; there were no regional wall motion abnormalities. Doppler parameters are consistent with abnormal left ventricular relaxation (grade 1 diastolic dysfunction). - Aortic valve: Valve area (VTI): 1.63 cm^2. Valve area (Vmax): 1.57 cm^2. Valve area (Vmean): 1.37 cm^2. - Mitral valve: There was mild regurgitation. - Left atrium: The atrium was mildly dilated.   Myoview 05/13/2017 Study Highlights    Probable normal perfusion No significant ischemia or scar.  Nuclear stress EF: 74%.  Low risk study      ASSESSMENT AND PLAN:  1.  Hypertension: Initially elevated when seen in the office today but rechecked after talking with her for several minutes and decreased to 148/68.  This is still not optimal for patient with diabetes.  She is complaining of lower extremity edema on the amlodipine.  Would consider increasing Benicar to 80 mg daily from 40 mg daily if blood pressure is not  controlled on follow-up visits.  She is also given "salty 6" list of high salt foods to assist with blood pressure control.  It would be my recommendation to increase Benicar on follow-up if blood pressure is not at least 120/60.  2.  Hypercholesterolemia: The patient has a history of nonobstructive CAD, she is on Crestor 20 mg daily.  Most recent LDL that has been documented was 158 in 2012.  She is followed by PCP with annual labs.  Recommend LDL of less than 70 with CAD.  She will need to have fasting lipids and LFTs done if not done by PCP on follow-up.  3. Insulin-dependent diabetes: Hemoglobin A1c 10.  She is recently been started on Jardiance by her endocrinologist.  Due to this she will probably diuresis more and therefore I have asked her to decrease her furosemide from 80 mg daily to 40 mg daily in 2 days as she is not yet started the [place] and will begin tomorrow.  If she does have some fluid weight gain she can take the Lasix as needed.  I am trying to avoid dehydration.  Endocrinology is repeating labs on follow-up with them.  4.  Thyroid disease: Continues on levothyroxine, followed by endocrinology.  5.  Situational depression: I have encouraged her to speak with counselor as she continues to have significant problems with depression and mourning the loss of her husband.  She believes that this is keeping her from moving forward in her life and taking care of herself.  I have advised her that she needs to choose life versus mere existence and continue to work hard on her health if not for her for her daughter.  She verbalizes understanding and agrees.  Current medicines are reviewed at length with the patient today.    Labs/ tests ordered today include: None.  Do recommend fasting lipids LFTs, BMET.  If not done on next office visit.  Phill Myron. West Pugh, ANP, AACC   08/06/2019 3:39 PM    [facility] [address]  [address] Office [phone] Fax  [phone]

## 2019-08-06 ENCOUNTER — Ambulatory Visit: Payer: 59 | Admitting: Adult Health

## 2019-08-06 ENCOUNTER — Other Ambulatory Visit: Payer: Self-pay

## 2019-08-06 ENCOUNTER — Encounter: Payer: Self-pay | Admitting: Adult Health

## 2019-08-06 VITALS — BP 148/68 | HR 95 | Ht 66.0 in | Wt 280.6 lb

## 2019-08-06 DIAGNOSIS — IMO0001 Reserved for inherently not codable concepts without codable children: Secondary | ICD-10-CM

## 2019-08-06 DIAGNOSIS — E7800 Pure hypercholesterolemia, unspecified: Secondary | ICD-10-CM

## 2019-08-06 DIAGNOSIS — F4321 Adjustment disorder with depressed mood: Secondary | ICD-10-CM

## 2019-08-06 DIAGNOSIS — I5032 Chronic diastolic (congestive) heart failure: Secondary | ICD-10-CM | POA: Diagnosis not present

## 2019-08-06 DIAGNOSIS — R609 Edema, unspecified: Secondary | ICD-10-CM

## 2019-08-06 DIAGNOSIS — I10 Essential (primary) hypertension: Secondary | ICD-10-CM | POA: Diagnosis not present

## 2019-08-06 DIAGNOSIS — Z794 Long term (current) use of insulin: Secondary | ICD-10-CM

## 2019-08-06 DIAGNOSIS — E119 Type 2 diabetes mellitus without complications: Secondary | ICD-10-CM

## 2019-08-06 NOTE — Patient Instructions (Signed)
Medication Instructions:  DECREASE- Furosemide(lasix) 40 mg by mouth daily STARTING this Thursday  If you need a refill on your cardiac medications before your next appointment, please call your pharmacy.  Labwork: None Ordered   Testing/Procedures: None Ordered   Follow-Up: . Your physician recommends that you schedule a follow-up appointment in: 2 Months with Jory Sims   At [facility], you and your health needs are our priority.  As part of our continuing mission to provide you with exceptional heart care, we have created designated Provider Care Teams.  These Care Teams include your primary Cardiologist (physician) and Advanced Practice Providers (APPs -  Physician Assistants and Nurse Practitioners) who all work together to provide you with the care you need, when you need it.  Thank you for choosing [facility] at [facility]!!

## 2019-08-20 ENCOUNTER — Ambulatory Visit: Payer: 59 | Admitting: Adult Health

## 2019-08-31 ENCOUNTER — Other Ambulatory Visit: Payer: Self-pay

## 2019-08-31 ENCOUNTER — Ambulatory Visit
Admission: RE | Admit: 2019-08-31 | Discharge: 2019-08-31 | Disposition: A | Discharge status: Home / Self Care | Payer: 59 | Source: Ambulatory Visit | Attending: Internal Medicine | Admitting: Internal Medicine

## 2019-08-31 DIAGNOSIS — Z1231 Encounter for screening mammogram for malignant neoplasm of breast: Secondary | ICD-10-CM

## 2019-10-08 ENCOUNTER — Ambulatory Visit: Payer: 59 | Admitting: Adult Health

## 2019-10-30 ENCOUNTER — Other Ambulatory Visit: Payer: Self-pay | Admitting: Physician Assistant

## 2019-11-17 ENCOUNTER — Other Ambulatory Visit: Payer: Self-pay | Admitting: Cardiovascular Disease

## 2019-12-08 ENCOUNTER — Encounter: Payer: Self-pay | Admitting: Physical Medicine and Rehabilitation

## 2019-12-31 ENCOUNTER — Ambulatory Visit: Payer: Self-pay

## 2019-12-31 ENCOUNTER — Encounter: Payer: Self-pay | Admitting: Orthopaedic Surgery

## 2019-12-31 ENCOUNTER — Other Ambulatory Visit: Payer: Self-pay

## 2019-12-31 ENCOUNTER — Ambulatory Visit: Payer: 59 | Admitting: Orthopaedic Surgery

## 2019-12-31 DIAGNOSIS — M5441 Lumbago with sciatica, right side: Secondary | ICD-10-CM | POA: Diagnosis not present

## 2019-12-31 DIAGNOSIS — G8929 Other chronic pain: Secondary | ICD-10-CM

## 2019-12-31 MED ORDER — NABUMETONE 750 MG PO TABS: 750.0000 mg | ORAL_TABLET | Freq: Two times a day (BID) | ORAL | 1 refills | Status: DC | PRN

## 2019-12-31 MED ORDER — TIZANIDINE HCL 4 MG PO TABS: 4.0000 mg | ORAL_TABLET | Freq: Three times a day (TID) | ORAL | 1 refills | Status: DC | PRN

## 2019-12-31 MED ORDER — GABAPENTIN 300 MG PO CAPS: 300.0000 mg | ORAL_CAPSULE | Freq: Every day | ORAL | 1 refills | Status: DC

## 2019-12-31 NOTE — Progress Notes (Signed)
Office Visit Note   Patient: Sue Davidson           Date of Birth: 24-Aug-1951           MRN: [id number] Visit Date: 12/31/2019              Requested by: Deland Pretty, MD [address] [place],  [place] [address] PCP: Deland Pretty, MD   Assessment & Plan: Visit Diagnoses:  1. Chronic right-sided low back pain with right-sided sciatica     Plan: I will try Relafen as an anti-inflammatory for her.  This will be 750 mg given her obesity.  We will also try Neurontin to take at bedtime as well as Zanaflex.  She understands that she needs to actually get her MRI report and the MRI disc it was done in February of this year so we can set her up for a consultation with Dr. Ernestina Patches.  She understands this and will get these studies for [place].  Follow-Up Instructions: No follow-ups on file.   Orders:  Orders Placed This Encounter  Procedures  . XR Lumbar Spine 2-3 Views   Meds ordered this encounter  Medications  . nabumetone (RELAFEN) 750 MG tablet    Sig: Take 1 tablet (750 mg total) by mouth 2 (two) times daily as needed.    Dispense:  60 tablet    Refill:  1  . tiZANidine (ZANAFLEX) 4 MG tablet    Sig: Take 1 tablet (4 mg total) by mouth every 8 (eight) hours as needed for muscle spasms.    Dispense:  60 tablet    Refill:  1  . gabapentin (NEURONTIN) 300 MG capsule    Sig: Take 1 capsule (300 mg total) by mouth at bedtime.    Dispense:  60 capsule    Refill:  1      Procedures: No procedures performed   Clinical Data: No additional findings.   Subjective: Chief Complaint  Patient presents with  . Lower Back - Pain  The patient is someone I am seeing for the first time.  This is mainly to transfer care from another orthopedic office in town.  This is for severe low back pain.  She says it radiates across her back and into her right hip and sciatic region.  It does go into the groin some.  It radiates into the thigh but not down past the knee or into her foot.   She is a diabetic but reports good control.  She actually had a MRI at [facility] in early February of this year.  This was of the lumbar spine.  She also had an intervention by Dr. Herma Mering which was an injection in her back.  She was given hydrocodone for pain.  She states that the injection helped for short amount of time but now she is having significant pain.  Apparently she has a friend who goes to church with her who has had a good outcome with seeing Dr. Ernestina Patches and she would like to see Dr. Ernestina Patches here if possible.  She denies any change in bowel bladder function.  She is someone who does weigh between 260 to 280 pounds.  HPI  Review of Systems She currently denies any headache, chest pain, shortness of breath, fever, chills, nausea, vomiting  Objective: Vital Signs: There were no vitals taken for this visit.  Physical Exam She is alert and orient x3 and in no acute distress Ortho Exam I did examine both hips  and they both move smoothly and fluidly with no pain in the groin at all or blocks to rotation of either hip.  She has limited flexion extension of her lumbar spine some secondary to pain and some secondary to her morbid obesity.  There is pain to palpation across the mid to lower lumbar spine mainly to the right side. Specialty Comments:  No specialty comments available.  Imaging: XR Lumbar Spine 2-3 Views  Result Date: 12/31/2019 2 views of the lumbar spine show no acute findings.  There is degenerative changes at multiple levels of lumbar spine with a degenerative scoliosis.    PMFS History: Patient Active Problem List   Diagnosis Date Noted  . Chest pain 04/14/2017  . AKI (acute kidney injury) ([place]) 04/14/2017  . Hyponatremia 04/14/2017  . Carotid disease, bilateral ([place]) 10/06/2016  . Normal coronary arteries 10/06/2016  . Lateral meniscal tear 06/29/2016  . Pulmonary artery hypertension ([place]) 04/23/2016  . Exertional dyspnea 01/26/2016  . Bilateral leg  edema 01/26/2016  . Morbid obesity ([place]) 01/26/2016  . Dyslipidemia 01/26/2016  . Chronic cholecystitis 10/25/2013  . DYSPNEA 06/05/2009  . Insulin dependent diabetes mellitus ([place]) 06/04/2009  . OSA (obstructive sleep apnea) 06/04/2009  . Essential hypertension 06/04/2009  . EDEMA LEG 06/04/2009   Past Medical History:  Diagnosis Date  . Anemia    hx of  . Anginal pain (HCC)    hx of  . Anxiety   . Depression   . Diabetes mellitus without complication ([place])   . GERD (gastroesophageal reflux disease)   . Headache(784.0)   . Heart murmur   . Hyperlipidemia   . Hypertension   . MRSA (methicillin resistant staph aureus) culture positive    many years ago-abdominal wound- no issuses since.  Marland Kitchen PONV (postoperative nausea and vomiting)    severe  . Sleep apnea    Study done -remains under evaluation- no cpap yet.  . Vitamin D deficiency     Family History  Problem Relation Age of Onset  . Diabetes Mother   . Heart disease Mother        CABG age 74s  . Cancer Father        throat and lung  . Heart disease Father   . Cancer Paternal Aunt        breast  . Breast cancer Paternal Aunt   . Cancer Paternal Aunt        colon    Past Surgical History:  Procedure Laterality Date  . ABDOMINAL HYSTERECTOMY  1970's  . APPENDECTOMY  1970's  . BREAST BIOPSY Left   . CARDIAC CATHETERIZATION    . CHOLECYSTECTOMY N/A 10/02/2013   Procedure: LAPAROSCOPIC CHOLECYSTECTOMY;  Surgeon: Earnstine Regal, MD;  Location: [facility];  Service: General;  Laterality: N/A;  . cyst removed Left    wrist"ganglion"  . ELBOW SURGERY Left   . HARDWARE REMOVAL Left    ankle  . KNEE ARTHROSCOPY Right 06/30/2016   Procedure: ARTHROSCOPY RIGHT KNEE WITH MEDIAL AND LATERAL MENSICAL DEBRIDEMENT;  Surgeon: Gaynelle Arabian, MD;  Location: [facility];  Service: Orthopedics;  Laterality: Right;  LMA  . LEG SURGERY Left 1980's   broke leg and ankle  . SHOULDER ARTHROSCOPY Bilateral   . TONSILLECTOMY  1970's  . TUBAL  LIGATION  1970's   Social History   Occupational History  . Not on file  Tobacco Use  . Smoking status: Never Smoker  . Smokeless tobacco: Never Used  Substance and Sexual Activity  .  Alcohol use: No    Alcohol/week: 0.0 standard drinks  . Drug use: No  . Sexual activity: Yes    Birth control/protection: Spermicide

## 2020-01-16 ENCOUNTER — Institutional Professional Consult (permissible substitution): Payer: 59 | Admitting: Physical Medicine and Rehabilitation

## 2020-01-21 ENCOUNTER — Ambulatory Visit: Payer: 59 | Admitting: Physical Medicine and Rehabilitation

## 2020-01-21 ENCOUNTER — Encounter: Payer: Self-pay | Admitting: Physical Medicine and Rehabilitation

## 2020-01-21 ENCOUNTER — Other Ambulatory Visit: Payer: Self-pay

## 2020-01-21 VITALS — BP 154/70 | HR 68 | Ht 66.0 in | Wt 267.0 lb

## 2020-01-21 DIAGNOSIS — R269 Unspecified abnormalities of gait and mobility: Secondary | ICD-10-CM | POA: Diagnosis not present

## 2020-01-21 DIAGNOSIS — M25551 Pain in right hip: Secondary | ICD-10-CM

## 2020-01-21 DIAGNOSIS — M5116 Intervertebral disc disorders with radiculopathy, lumbar region: Secondary | ICD-10-CM

## 2020-01-21 DIAGNOSIS — M5416 Radiculopathy, lumbar region: Secondary | ICD-10-CM

## 2020-01-21 NOTE — Progress Notes (Signed)
.  Numeric Pain Rating Scale and Functional Assessment Average Pain 10 Pain Right Now 10 My pain is constant, dull and aching Pain is worse with: walking, bending, standing and some activites Pain improves with: heat/ice and medication   In the last MONTH (on 0-10 scale) has pain interfered with the following?  1. General activity like being  able to carry out your everyday physical activities such as walking, climbing stairs, carrying groceries, or moving a chair?  Rating(9)  2. Relation with others like being able to carry out your usual social activities and roles such as  activities at home, at work and in your community. Rating(9)  3. Enjoyment of life such that you have  been bothered by emotional problems such as feeling anxious, depressed or irritable?  Rating(8)

## 2020-01-22 ENCOUNTER — Encounter: Payer: Self-pay | Admitting: Physical Medicine and Rehabilitation

## 2020-01-22 NOTE — Progress Notes (Signed)
Sue Davidson - 69 y.o. female MRN [id number]  Date of birth: 1950/11/09  Office Visit Note: Visit Date: 01/21/2020 PCP: Deland Pretty, MD Referred by: Deland Pretty, MD  Subjective: Chief Complaint  Patient presents with  . Lower Back - Pain  . Right Hip - Pain   HPI: Sue Davidson is a 69 y.o. female who comes in today For new patient consultation at the request of Dr. Jean Rosenthal for chronic worsening back pain with exacerbation and now radicular type pain from the buttock to the right upper thigh and groin.  Patient reports 3 months ago getting up from a chair and she does a lot of work at home on the computer and suddenly was having a lot of pain in the right low back and buttock region.  This progressed into pain referable into the right thigh and groin.  She reports no significant trauma or falls etc.  No left-sided problems.  She has a history of off-and-on back pain chronically but nothing like this.  Her case is complicated by morbid obesity.  She does have insulin-dependent diabetes.  She has not had any lumbar surgery.  She reports initially being seen by Dr. Maxie Better at [facility].  MRI was performed and does show multilevel changes but in particular there is a right paracentral and foraminal disc extrusion at L1-2.  She actually had a prior MRI in 2020 from a fall and this was not really prevalent on that MRI based on the report.  She did bring the reports today as well as a disc containing the imaging from the most recent MRI which was from just a few months ago.  I reviewed all of those notes as well as Dr. Trevor Mace notes.  At [facility] she obtained some type of epidural injection by Dr. Suella Broad and she does report that this seemed to be pretty beneficial for a few days and then the symptoms returned.  Diagnostically it did seem to help quite a bit.  She only received 1 injection.  She is friends with a patient that I had seen in the past and it helped.  She wanted to come  to our practice to see if she could get more help.  She was initially seen by Dr. Ninfa Linden from an orthopedic standpoint and he felt like her hip was really not involved even though she was having groin symptoms.  He did a thorough evaluation of this and his notes reviewed.  He did start her on gabapentin and she has been taking that at night.  She has had some pain medication without much relief.  She rates her pain as a 10 out of 10 which is constant dull and aching.  She presents today in a wheelchair just because of the pain.  She can ambulate but the pain is so bad she gets nauseated.  She can get comfortable lying down at times.  It is a dull aching pain most of the time and then it can be sharp when she is standing and walking.  It radiates again to the right hip and anterior groin.  She has not noted any weakness per se no focal weakness.  She has had no red flag complaints otherwise.  Review of Systems  Musculoskeletal: Positive for back pain and joint pain.  All other systems reviewed and are negative.  Otherwise per HPI.  Assessment & Plan: Visit Diagnoses:  1. Lumbar radiculopathy   2. Radiculopathy due to lumbar intervertebral disc disorder  3. Pain in right hip   4. Gait abnormality     Plan: Findings:  Chronic now worsening severe exacerbation of right low back buttock hip pain with referral to the anterior upper thigh and groin.  Thorough evaluation does not seem like this is a hip issue at all and she did get relief with some type of epidural injection by Dr. Herma Mering.  The relief was very short-lived.  Reviewing images with her today using spine models and the imaging from the CD that she brought I do feel like this is an L1 radiculopathy from the foraminal extrusion at L1-2.  She has other findings lower but I do still think they fit with her symptoms.  At this point I want to try a right L1 transforaminal epidural steroid injection and we went over this at length and she does want  to try that.  Again case complicated by insulin-dependent diabetes and morbid obesity.  We talked about natural history of these disc herniations and also surgical approaches that she might want to entertain.  We also talked about medication management and we want to increase her gabapentin to twice a day.  She can also continue with muscle relaxer although she did not feel like it helped that much.  We would like to get her into a stage where she can ambulate better and may be regroup with physical therapy at that point when she is mobile.  I am going to request records from Dr. Herma Mering to see exactly what injection he performed.  It seems like from talking to her that it was probably an interlaminar injection below this level of herniation.    Meds & Orders: No orders of the defined types were placed in this encounter.  No orders of the defined types were placed in this encounter.   Follow-up: Return for Right L1 transforaminal epidural steroid injection.   Procedures: No procedures performed  No notes on file   Clinical History: No specialty comments available.   She reports that she has never smoked. She has never used smokeless tobacco. No results for input(s): HGBA1C, LABURIC in the last 8760 hours.  Objective:  VS:  HT:5\' 6"  (167.6 cm)   WT:267 lb (121.1 kg)  BMI:43.12    BP:(!) 154/70  HR:68bpm  TEMP: ( )  RESP:  Physical Exam Vitals and nursing note reviewed.  Constitutional:      General: She is not in acute distress.    Appearance: Normal appearance. She is well-developed. She is obese. She is not ill-appearing.  HENT:     Head: Normocephalic and atraumatic.  Eyes:     Conjunctiva/sclera: Conjunctivae normal.     Pupils: Pupils are equal, round, and reactive to light.  Cardiovascular:     Rate and Rhythm: Normal rate.     Pulses: Normal pulses.  Pulmonary:     Effort: Pulmonary effort is normal.  Musculoskeletal:        General: Tenderness present.     Right lower  leg: No edema.     Left lower leg: No edema.     Comments: Patient presents in wheelchair Duda pain with standing and ambulating.  She can stand and is slow to rise from a seated position.  She has mild pain over the right greater trochanter compared to left.  She has good distal strength with dorsiflexion plantarflexion EHL.  She has painless range of motion of the hips bilaterally.  Skin:    General: Skin is warm and  dry.     Findings: No erythema or rash.  Neurological:     General: No focal deficit present.     Mental Status: She is alert and oriented to person, place, and time.     Sensory: No sensory deficit.     Motor: No weakness or abnormal muscle tone.     Coordination: Coordination normal.     Gait: Gait normal.  Psychiatric:        Mood and Affect: Mood normal.        Behavior: Behavior normal.     Ortho Exam Imaging: No results found.  Past Medical/Family/Surgical/Social History: Medications & Allergies reviewed per EMR, new medications updated. Patient Active Problem List   Diagnosis Date Noted  . Chest pain 04/14/2017  . AKI (acute kidney injury) ([place]) 04/14/2017  . Hyponatremia 04/14/2017  . Carotid disease, bilateral ([place]) 10/06/2016  . Normal coronary arteries 10/06/2016  . Lateral meniscal tear 06/29/2016  . Pulmonary artery hypertension ([place]) 04/23/2016  . Exertional dyspnea 01/26/2016  . Bilateral leg edema 01/26/2016  . Morbid obesity ([place]) 01/26/2016  . Dyslipidemia 01/26/2016  . Chronic cholecystitis 10/25/2013  . DYSPNEA 06/05/2009  . Insulin dependent diabetes mellitus ([place]) 06/04/2009  . OSA (obstructive sleep apnea) 06/04/2009  . Essential hypertension 06/04/2009  . EDEMA LEG 06/04/2009   Past Medical History:  Diagnosis Date  . Anemia    hx of  . Anginal pain ([facility])    hx of  . Anxiety   . Depression   . Diabetes mellitus without complication ([place])   . GERD (gastroesophageal reflux disease)   . Headache(784.0)   . Heart murmur   .  Hyperlipidemia   . Hypertension   . MRSA (methicillin resistant staph aureus) culture positive    many years ago-abdominal wound- no issuses since.  Marland Kitchen PONV (postoperative nausea and vomiting)    severe  . Sleep apnea    Study done -remains under evaluation- no cpap yet.  . Vitamin D deficiency    Family History  Problem Relation Age of Onset  . Diabetes Mother   . Heart disease Mother        CABG age 30s  . Cancer Father        throat and lung  . Heart disease Father   . Cancer Paternal Aunt        breast  . Breast cancer Paternal Aunt   . Cancer Paternal Aunt        colon   Past Surgical History:  Procedure Laterality Date  . ABDOMINAL HYSTERECTOMY  1970's  . APPENDECTOMY  1970's  . BREAST BIOPSY Left   . CARDIAC CATHETERIZATION    . CHOLECYSTECTOMY N/A 10/02/2013   Procedure: LAPAROSCOPIC CHOLECYSTECTOMY;  Surgeon: Earnstine Regal, MD;  Location: [facility];  Service: General;  Laterality: N/A;  . cyst removed Left    wrist"ganglion"  . ELBOW SURGERY Left   . HARDWARE REMOVAL Left    ankle  . KNEE ARTHROSCOPY Right 06/30/2016   Procedure: ARTHROSCOPY RIGHT KNEE WITH MEDIAL AND LATERAL MENSICAL DEBRIDEMENT;  Surgeon: Gaynelle Arabian, MD;  Location: [facility];  Service: Orthopedics;  Laterality: Right;  LMA  . LEG SURGERY Left 1980's   broke leg and ankle  . SHOULDER ARTHROSCOPY Bilateral   . TONSILLECTOMY  1970's  . TUBAL LIGATION  1970's   Social History   Occupational History  . Not on file  Tobacco Use  . Smoking status: Never Smoker  . Smokeless tobacco: Never Used  Substance and Sexual Activity  . Alcohol use: No    Alcohol/week: 0.0 standard drinks  . Drug use: No  . Sexual activity: Yes    Birth control/protection: Spermicide

## 2020-01-28 ENCOUNTER — Ambulatory Visit: Payer: Self-pay

## 2020-01-28 ENCOUNTER — Encounter: Payer: Self-pay | Admitting: Physical Medicine and Rehabilitation

## 2020-01-28 ENCOUNTER — Telehealth: Payer: Self-pay | Admitting: *Deleted

## 2020-01-28 ENCOUNTER — Ambulatory Visit: Payer: 59 | Admitting: Physical Medicine and Rehabilitation

## 2020-01-28 ENCOUNTER — Other Ambulatory Visit: Payer: Self-pay

## 2020-01-28 VITALS — BP 162/74 | HR 68

## 2020-01-28 DIAGNOSIS — M5116 Intervertebral disc disorders with radiculopathy, lumbar region: Secondary | ICD-10-CM | POA: Diagnosis not present

## 2020-01-28 DIAGNOSIS — M5416 Radiculopathy, lumbar region: Secondary | ICD-10-CM

## 2020-01-28 MED ORDER — GABAPENTIN 300 MG PO CAPS: 300.0000 mg | ORAL_CAPSULE | Freq: Every day | ORAL | 1 refills | Status: DC

## 2020-01-28 MED ORDER — DEXAMETHASONE SODIUM PHOSPHATE 10 MG/ML IJ SOLN
15.0000 mg | Freq: Once | INTRAMUSCULAR | Status: AC
  Administered 2020-01-28: 15 mg

## 2020-01-28 NOTE — Progress Notes (Signed)
 .  Numeric Pain Rating Scale and Functional Assessment Average Pain 8   In the last MONTH (on 0-10 scale) has pain interfered with the following?  1. General activity like being  able to carry out your everyday physical activities such as walking, climbing stairs, carrying groceries, or moving a chair?  Rating(8)   +Driver, -BT, -Dye Allergies.  

## 2020-01-28 NOTE — Telephone Encounter (Signed)
Please advice  

## 2020-01-29 NOTE — Procedures (Signed)
Lumbosacral Transforaminal Epidural Steroid Injection - Sub-Pedicular Approach with Fluoroscopic Guidance  Patient: Sue Davidson      Date of Birth: 10/16/1951 MRN: [id number] PCP: Deland Pretty, MD      Visit Date: 01/28/2020   Universal Protocol:    Date/Time: 01/28/2020  Consent Given By: the patient  Position: PRONE  Additional Comments: Vital signs were monitored before and after the procedure. Patient was prepped and draped in the usual sterile fashion. The correct patient, procedure, and site was verified.   Injection Procedure Details:  Procedure Site One Meds Administered:  Meds ordered this encounter  Medications  . dexamethasone (DECADRON) injection 15 mg    Laterality: Right  Location/Site:  L1-L2  Needle size: 62 G  Needle type: Spinal  Needle Placement: Transforaminal  Findings:    -Comments: Excellent flow of contrast along the nerve and into the epidural space.  Procedure Details: After squaring off the end-plates to get a true AP view, the C-arm was positioned so that an oblique view of the foramen as noted above was visualized. The target area is just inferior to the "nose of the scotty dog" or sub pedicular. The soft tissues overlying this structure were infiltrated with 2-3 ml. of 1% Lidocaine without Epinephrine.  The spinal needle was inserted toward the target using a "trajectory" view along the fluoroscope beam.  Under AP and lateral visualization, the needle was advanced so it did not puncture dura and was located close the 6 O'Clock position of the pedical in AP tracterory. Biplanar projections were used to confirm position. Aspiration was confirmed to be negative for CSF and/or blood. A 1-2 ml. volume of Isovue-250 was injected and flow of contrast was noted at each level. Radiographs were obtained for documentation purposes.   After attaining the desired flow of contrast documented above, a 0.5 to 1.0 ml test dose of 0.25% Marcaine was injected  into each respective transforaminal space.  The patient was observed for 90 seconds post injection.  After no sensory deficits were reported, and normal lower extremity motor function was noted,   the above injectate was administered so that equal amounts of the injectate were placed at each foramen (level) into the transforaminal epidural space.   Additional Comments:  The patient tolerated the procedure well Dressing: 2 x 2 sterile gauze and Band-Aid    Post-procedure details: Patient was observed during the procedure. Post-procedure instructions were reviewed.  Patient left the clinic in stable condition.

## 2020-01-29 NOTE — Progress Notes (Signed)
Sue Davidson - 69 y.o. female MRN [id number]  Date of birth: [birth year]  Office Visit Note: Visit Date: 01/28/2020 PCP: Deland Pretty, MD Referred by: Deland Pretty, MD  Subjective: Chief Complaint  Patient presents with  . Lower Back - Pain  . Right Hip - Pain   HPI:  Sue Davidson is a 69 y.o. female who comes in today For planned right L1 transforaminal epidural steroid injection diagnostically hopefully therapeutically.The patient has failed conservative care including home exercise, medications, time and activity modification.  This injection will be diagnostic and hopefully therapeutic.  Please see requesting physician notes for further details and justification.   ROS Otherwise per HPI.  Assessment & Plan: Visit Diagnoses:  1. Lumbar radiculopathy   2. Radiculopathy due to lumbar intervertebral disc disorder     Plan: No additional findings.   Meds & Orders:  Meds ordered this encounter  Medications  . dexamethasone (DECADRON) injection 15 mg    Orders Placed This Encounter  Procedures  . XR C-ARM NO REPORT  . Epidural Steroid injection    Follow-up: Return if symptoms worsen or fail to improve.   Procedures: No procedures performed  Lumbosacral Transforaminal Epidural Steroid Injection - Sub-Pedicular Approach with Fluoroscopic Guidance  Patient: Sue Davidson      Date of Birth: [birth year] MRN: [id number] PCP: Deland Pretty, MD      Visit Date: 01/28/2020   Universal Protocol:    Date/Time: 01/28/2020  Consent Given By: the patient  Position: PRONE  Additional Comments: Vital signs were monitored before and after the procedure. Patient was prepped and draped in the usual sterile fashion. The correct patient, procedure, and site was verified.   Injection Procedure Details:  Procedure Site One Meds Administered:  Meds ordered this encounter  Medications  . dexamethasone (DECADRON) injection 15 mg    Laterality: Right  Location/Site:   L1-L2  Needle size: 76 G  Needle type: Spinal  Needle Placement: Transforaminal  Findings:    -Comments: Excellent flow of contrast along the nerve and into the epidural space.  Procedure Details: After squaring off the end-plates to get a true AP view, the C-arm was positioned so that an oblique view of the foramen as noted above was visualized. The target area is just inferior to the "nose of the scotty dog" or sub pedicular. The soft tissues overlying this structure were infiltrated with 2-3 ml. of 1% Lidocaine without Epinephrine.  The spinal needle was inserted toward the target using a "trajectory" view along the fluoroscope beam.  Under AP and lateral visualization, the needle was advanced so it did not puncture dura and was located close the 6 O'Clock position of the pedical in AP tracterory. Biplanar projections were used to confirm position. Aspiration was confirmed to be negative for CSF and/or blood. A 1-2 ml. volume of Isovue-250 was injected and flow of contrast was noted at each level. Radiographs were obtained for documentation purposes.   After attaining the desired flow of contrast documented above, a 0.5 to 1.0 ml test dose of 0.25% Marcaine was injected into each respective transforaminal space.  The patient was observed for 90 seconds post injection.  After no sensory deficits were reported, and normal lower extremity motor function was noted,   the above injectate was administered so that equal amounts of the injectate were placed at each foramen (level) into the transforaminal epidural space.   Additional Comments:  The patient tolerated the procedure well Dressing: 2 x 2 sterile gauze and Band-Aid  Post-procedure details: Patient was observed during the procedure. Post-procedure instructions were reviewed.  Patient left the clinic in stable condition.     Clinical History: No specialty comments available.     Objective:  VS:  HT:    WT:   BMI:      BP:(!) 162/74  HR:68bpm  TEMP: ( )  RESP:  Physical Exam  Ortho Exam Imaging: XR C-ARM NO REPORT  Result Date: 01/28/2020 Please see Notes tab for imaging impression.

## 2020-02-01 ENCOUNTER — Telehealth: Payer: Self-pay | Admitting: Physical Medicine and Rehabilitation

## 2020-02-01 ENCOUNTER — Other Ambulatory Visit: Payer: Self-pay | Admitting: Physical Medicine and Rehabilitation

## 2020-02-01 MED ORDER — GABAPENTIN 300 MG PO CAPS: 300.0000 mg | ORAL_CAPSULE | Freq: Two times a day (BID) | ORAL | 1 refills | Status: DC

## 2020-02-01 NOTE — Telephone Encounter (Signed)
Patient's gabapentin was refilled by Dr. Ninfa Linden on 3/22. She states that it was filled for once per day. She states that she needs a new prescription sent for twice a day. Pharmacy is correct. Please advise.

## 2020-02-01 NOTE — Telephone Encounter (Signed)
done

## 2020-08-08 ENCOUNTER — Other Ambulatory Visit: Payer: Self-pay | Admitting: Internal Medicine

## 2020-08-08 DIAGNOSIS — Z1231 Encounter for screening mammogram for malignant neoplasm of breast: Secondary | ICD-10-CM

## 2020-09-01 ENCOUNTER — Other Ambulatory Visit: Payer: Self-pay

## 2020-09-01 ENCOUNTER — Ambulatory Visit
Admission: RE | Admit: 2020-09-01 | Discharge: 2020-09-01 | Disposition: A | Discharge status: Home / Self Care | Payer: 59 | Source: Ambulatory Visit | Attending: Internal Medicine | Admitting: Internal Medicine

## 2020-09-01 DIAGNOSIS — Z1231 Encounter for screening mammogram for malignant neoplasm of breast: Secondary | ICD-10-CM

## 2020-09-24 DIAGNOSIS — I509 Heart failure, unspecified: Secondary | ICD-10-CM | POA: Insufficient documentation

## 2021-02-17 ENCOUNTER — Other Ambulatory Visit: Payer: Self-pay | Admitting: Gastroenterology

## 2021-02-17 DIAGNOSIS — R1030 Lower abdominal pain, unspecified: Secondary | ICD-10-CM

## 2021-03-04 ENCOUNTER — Ambulatory Visit
Admission: RE | Admit: 2021-03-04 | Discharge: 2021-03-04 | Disposition: A | Discharge status: Home / Self Care | Payer: 59 | Source: Ambulatory Visit | Attending: Gastroenterology | Admitting: Gastroenterology

## 2021-03-04 ENCOUNTER — Other Ambulatory Visit: Payer: Self-pay

## 2021-03-04 DIAGNOSIS — R1030 Lower abdominal pain, unspecified: Secondary | ICD-10-CM

## 2021-03-04 MED ORDER — IOPAMIDOL (ISOVUE-300) INJECTION 61%
100.0000 mL | Freq: Once | INTRAVENOUS | Status: AC | PRN
  Administered 2021-03-04: 100 mL via INTRAVENOUS

## 2021-03-05 ENCOUNTER — Other Ambulatory Visit: Payer: Self-pay | Admitting: Orthopedic Surgery

## 2021-08-21 DIAGNOSIS — E7800 Pure hypercholesterolemia, unspecified: Secondary | ICD-10-CM | POA: Diagnosis not present

## 2021-08-21 DIAGNOSIS — E1165 Type 2 diabetes mellitus with hyperglycemia: Secondary | ICD-10-CM | POA: Diagnosis not present

## 2021-08-21 DIAGNOSIS — E039 Hypothyroidism, unspecified: Secondary | ICD-10-CM | POA: Diagnosis not present

## 2021-08-21 DIAGNOSIS — E559 Vitamin D deficiency, unspecified: Secondary | ICD-10-CM | POA: Diagnosis not present

## 2021-08-26 ENCOUNTER — Other Ambulatory Visit: Payer: Self-pay | Admitting: Internal Medicine

## 2021-08-26 DIAGNOSIS — Z1231 Encounter for screening mammogram for malignant neoplasm of breast: Secondary | ICD-10-CM

## 2021-09-11 ENCOUNTER — Other Ambulatory Visit: Payer: Self-pay

## 2021-09-11 ENCOUNTER — Ambulatory Visit
Admission: RE | Admit: 2021-09-11 | Discharge: 2021-09-11 | Disposition: A | Discharge status: Home / Self Care | Payer: Medicare Other | Source: Ambulatory Visit | Attending: Internal Medicine | Admitting: Internal Medicine

## 2021-09-11 DIAGNOSIS — Z1231 Encounter for screening mammogram for malignant neoplasm of breast: Secondary | ICD-10-CM | POA: Diagnosis not present

## 2021-10-15 DIAGNOSIS — J069 Acute upper respiratory infection, unspecified: Secondary | ICD-10-CM | POA: Diagnosis not present

## 2021-10-15 DIAGNOSIS — R6889 Other general symptoms and signs: Secondary | ICD-10-CM | POA: Diagnosis not present

## 2021-10-16 DIAGNOSIS — E1165 Type 2 diabetes mellitus with hyperglycemia: Secondary | ICD-10-CM | POA: Diagnosis not present

## 2021-10-16 DIAGNOSIS — E559 Vitamin D deficiency, unspecified: Secondary | ICD-10-CM | POA: Diagnosis not present

## 2021-10-16 DIAGNOSIS — E7801 Familial hypercholesterolemia: Secondary | ICD-10-CM | POA: Diagnosis not present

## 2021-10-21 ENCOUNTER — Ambulatory Visit: Payer: 59 | Admitting: Physician Assistant

## 2021-10-23 DIAGNOSIS — E039 Hypothyroidism, unspecified: Secondary | ICD-10-CM | POA: Diagnosis not present

## 2021-10-23 DIAGNOSIS — I10 Essential (primary) hypertension: Secondary | ICD-10-CM | POA: Diagnosis not present

## 2021-10-23 DIAGNOSIS — E559 Vitamin D deficiency, unspecified: Secondary | ICD-10-CM | POA: Diagnosis not present

## 2021-10-23 DIAGNOSIS — E1165 Type 2 diabetes mellitus with hyperglycemia: Secondary | ICD-10-CM | POA: Diagnosis not present

## 2021-12-24 DIAGNOSIS — M65849 Other synovitis and tenosynovitis, unspecified hand: Secondary | ICD-10-CM | POA: Diagnosis not present

## 2021-12-24 DIAGNOSIS — M65321 Trigger finger, right index finger: Secondary | ICD-10-CM | POA: Diagnosis not present

## 2021-12-24 DIAGNOSIS — M79641 Pain in right hand: Secondary | ICD-10-CM | POA: Diagnosis not present

## 2021-12-25 DIAGNOSIS — E1165 Type 2 diabetes mellitus with hyperglycemia: Secondary | ICD-10-CM | POA: Diagnosis not present

## 2021-12-25 DIAGNOSIS — E559 Vitamin D deficiency, unspecified: Secondary | ICD-10-CM | POA: Diagnosis not present

## 2021-12-25 DIAGNOSIS — E7800 Pure hypercholesterolemia, unspecified: Secondary | ICD-10-CM | POA: Diagnosis not present

## 2021-12-25 DIAGNOSIS — E039 Hypothyroidism, unspecified: Secondary | ICD-10-CM | POA: Diagnosis not present

## 2021-12-31 NOTE — Progress Notes (Signed)
Cardiology Clinic Note   Patient Name: Sue Davidson Date of Encounter: 01/01/2022  Primary Care Provider:  Deland Pretty, MD Primary Cardiologist:  Sanda Klein, MD  Patient Profile    71 year old female with history of chronic bilateral lower extremity edema, nonobstructive CAD per cath in 2012, hypertension, hyperlipidemia, with other history to include diabetes type 2, OSA on CPAP, pulmonary hypertension, and hypothyroidism.  Last seen in the office on 08/06/2019.  Past Medical History    Past Medical History:  Diagnosis Date   Anemia    hx of   Anginal pain (HCC)    hx of   Anxiety    Depression    Diabetes mellitus without complication (HCC)    GERD (gastroesophageal reflux disease)    Headache(784.0)    Heart murmur    Hyperlipidemia    Hypertension    MRSA (methicillin resistant staph aureus) culture positive    many years ago-abdominal wound- no issuses since.   PONV (postoperative nausea and vomiting)    severe   Sleep apnea    Study done -remains under evaluation- no cpap yet.   Vitamin D deficiency    Past Surgical History:  Procedure Laterality Date   ABDOMINAL HYSTERECTOMY  1970's   APPENDECTOMY  1970's   BREAST BIOPSY Left 2018   CARDIAC CATHETERIZATION     CHOLECYSTECTOMY N/A 10/02/2013   Procedure: LAPAROSCOPIC CHOLECYSTECTOMY;  Surgeon: Earnstine Regal, MD;  Location: [facility];  Service: General;  Laterality: N/A;   cyst removed Left    wrist"ganglion"   ELBOW SURGERY Left    HARDWARE REMOVAL Left    ankle   KNEE ARTHROSCOPY Right 06/30/2016   Procedure: ARTHROSCOPY RIGHT KNEE WITH MEDIAL AND LATERAL MENSICAL DEBRIDEMENT;  Surgeon: Gaynelle Arabian, MD;  Location: [facility];  Service: Orthopedics;  Laterality: Right;  LMA   LEG SURGERY Left 1980's   broke leg and ankle   SHOULDER ARTHROSCOPY Bilateral    TONSILLECTOMY  1970's   TUBAL LIGATION  1970's    Allergies  Allergies  Allergen Reactions   Tape Rash    History of Present Illness     Sue Davidson is a 71 year old female patient we are following for ongoing assessment and management of chronic lower extremity edema, nonobstructive CAD, hypertension, hyperlipidemia.  Was last seen in the office August 06, 2019 at which time the patient was complaining of worsening lower extremity edema and weight gain.  She admitted to indiscretion concerning salty foods.  She had recently seen her endocrinologist Dr. Criss Rosales who placed her on Jardiance due to elevated hemoglobin A1c.  The patient has good support from her daughter, who helps her manage her care and supports her concerning exercise regimen.  She was also seeking counseling concerning grief at the loss of her husband that past year.  On that last office visit there was consideration for taking her off of amlodipine as this was contributing to lower extremity edema and increasing Benicar from 40 mg daily to 80 mg daily.  She was to continue to take blood pressure readings at home and bring them with her on follow-up.  However it is been 2 years since she has been seen.  Labs were followed by PCP to include hypercholesterolemia and diabetes management through endocrinology.  She comes today feeling very well. She is losing weight, (down 40 lbs) exercising at the [facility] location, and plans to loose 100 lbs by the end of the year.  She is seeing a therapist, Vivia Budge.  She has been taken off of amlodipine by her PCP, and BP is stable. She has had recent labs, and is now being taken off of insulin by endocrinologist.   Home Medications    Current Outpatient Medications  Medication Sig Dispense Refill   acetaminophen (TYLENOL) 500 MG tablet Take 500 mg by mouth every 6 (six) hours as needed for moderate pain.     allopurinol (ZYLOPRIM) 100 MG tablet Take 100 mg by mouth 2 (two) times daily.     aspirin 81 MG tablet Take 81 mg by mouth daily.     cholecalciferol (VITAMIN D) 1000 units tablet Take 1,000 Units by mouth daily.      furosemide (LASIX) 80 MG tablet TAKE 1 TABLET BY MOUTH EVERY DAY 90 tablet 3   gabapentin (NEURONTIN) 300 MG capsule Take 1 capsule (300 mg total) by mouth 2 (two) times daily. 270 capsule 1   levothyroxine (SYNTHROID, LEVOTHROID) 75 MCG tablet Take 75 mcg by mouth daily before breakfast.     olmesartan (BENICAR) 40 MG tablet TAKE 1 TABLET BY MOUTH EVERY DAY 90 tablet 3   omeprazole (PRILOSEC) 40 MG capsule Take 40 mg by mouth daily. Reported on 01/19/2016     ondansetron (ZOFRAN) 8 MG tablet Take 8 mg by mouth 2 (two) times daily as needed for nausea or vomiting.     rosuvastatin (CRESTOR) 20 MG tablet Take 20 mg by mouth daily with breakfast.      sertraline (ZOLOFT) 100 MG tablet Take 100 mg by mouth daily.     No current facility-administered medications for this visit.     Family History    Family History  Problem Relation Age of Onset   Diabetes Mother    Heart disease Mother        CABG age 64s   Cancer Father        throat and lung   Heart disease Father    Cancer Paternal Aunt        breast   Breast cancer Paternal Aunt    Cancer Paternal Aunt        colon   She indicated that her mother is deceased. She indicated that her father is deceased. She indicated that both of her paternal aunts are deceased.  Social History    Social History   Socioeconomic History   Marital status: Widowed    Spouse name: Not on file   Number of children: Not on file   Years of education: Not on file   Highest education level: Not on file  Occupational History   Not on file  Tobacco Use   Smoking status: Never   Smokeless tobacco: Never  Substance and Sexual Activity   Alcohol use: No    Alcohol/week: 0.0 standard drinks   Drug use: No   Sexual activity: Yes    Birth control/protection: Spermicide  Other Topics Concern   Not on file  Social History Narrative   Epworth Sleepiness Scale Score:  15      --I have HTN   --I have had Insomnia   --I feel stressed and lack  motivation   --I have Diabetes   --I am overweight or am gaining weight   --I awake feeling not rested   Social Determinants of Health   Financial Resource Strain: Not on file  Food Insecurity: Not on file  Transportation Needs: Not on file  Physical Activity: Not on file  Stress: Not on file  Social Connections: Not on  file  Intimate Partner Violence: Not on file     Review of Systems    General:  No chills, fever, night sweats or weight changes.  Cardiovascular:  No chest pain, dyspnea on exertion, edema, orthopnea, palpitations, paroxysmal nocturnal dyspnea. Dermatological: No rash, lesions/masses Respiratory: No cough, dyspnea Urologic: No hematuria, dysuria Abdominal:   No nausea, vomiting, diarrhea, bright red blood per rectum, melena, or hematemesis Neurologic:  No visual changes, wkns, changes in mental status. All other systems reviewed and are otherwise negative except as noted above.     Physical Exam    VS:  BP 120/60 (BP Location: Left Arm, Patient Position: Sitting, Cuff Size: Large)    Pulse 76    Ht 5\' 6"  (1.676 m)    Wt 245 lb (111.1 kg)    BMI 39.54 kg/m  , BMI Body mass index is 39.54 kg/m.     GEN: Well nourished, well developed, in no acute distress. HEENT: normal. Neck: Supple, no JVD, carotid bruits, or masses. Cardiac: RRR, 1/6 systolic murmur, rubs, or gallops. No clubbing, cyanosis, edema.  Radials/DP/PT 2+ and equal bilaterally.  Respiratory:  Respirations regular and unlabored, clear to auscultation bilaterally. GI: Soft, nontender, nondistended, BS + x 4. MS: no deformity or atrophy.Some areas of ecchymosis due to her dog jumping on her.  Skin: warm and dry, no rash. Neuro:  Strength and sensation are intact. Psych: Normal affect.  Accessory Clinical Findings    ECG personally reviewed by me today- NSR with LBBB - No acute changes  Lab Results  Component Value Date   WBC 7.4 04/15/2017   HGB 11.5 (L) 04/15/2017   HCT 37.3 04/15/2017    MCV 89.0 04/15/2017   PLT 221 04/15/2017   Lab Results  Component Value Date   CREATININE 1.03 (H) 08/03/2019   BUN 19 08/03/2019   NA 140 08/03/2019   K 4.0 08/03/2019   CL 97 08/03/2019   CO2 30 (H) 08/03/2019   Lab Results  Component Value Date   ALT 25 04/15/2017   AST 37 04/15/2017   ALKPHOS 85 04/15/2017   BILITOT 0.5 04/15/2017   Lab Results  Component Value Date   CHOL 225 (H) 07/15/2011   HDL 30 (L) 07/15/2011   LDLCALC 158 (H) 07/15/2011   TRIG 186 (H) 07/15/2011   CHOLHDL 7.5 07/15/2011    Lab Results  Component Value Date   HGBA1C 8.6 (H) 04/15/2017    Review of Prior Studies: Cath 09/07/2011     Echo 04/14/2017 LV EF: 55% -   60% Study Conclusions   - Left ventricle: The cavity size was normal. Wall thickness was   increased in a pattern of mild LVH. Systolic function was normal.   The estimated ejection fraction was in the range of 55% to 60%.   Wall motion was normal; there were no regional wall motion   abnormalities. Doppler parameters are consistent with abnormal   left ventricular relaxation (grade 1 diastolic dysfunction). - Aortic valve: Valve area (VTI): 1.63 cm^2. Valve area (Vmax):   1.57 cm^2. Valve area (Vmean): 1.37 cm^2. - Mitral valve: There was mild regurgitation. - Left atrium: The atrium was mildly dilated.     Myoview 05/13/2017 Study Highlights    Probable normal perfusion No significant ischemia or scar. Nuclear stress EF: 74%. Low risk study      Assessment & Plan   1.  Hypertension:  Much better controlled with weight loss and exercise. She has been taken off of  amlodipine and remains on olmesartan. Labs have been taken by PCP.  No changes in regimen. Will see her in one year unless she is symptomatic. Can consider stress myoview with CVRF's on follow up or if she is symptomatic, last one in 2018.   2. Hyperlipidemia: Followed by PCP.  Continue rosuvastatin. Will request labs to review.  3. Obesity; Has lost 40 lbs  with diet and exercise, and is being followed closely by endocrinology for adjustments in medications, She has a goal of 100 lbs by the end of 2023.     Current medicines are reviewed at length with the patient today.  I have spent 30 min's  dedicated to the care of this patient on the date of this encounter to include pre-visit review of records, assessment, management and diagnostic testing,with shared decision making.   Signed, Phill Myron. West Pugh, ANP, AACC   01/01/2022 2:17 PM    [facility] [place] [address] Office [phone] Fax [phone]  Notice: This dictation was prepared with Dragon dictation along with smaller phrase technology. Any transcriptional errors that result from this process are unintentional and may not be corrected upon review.

## 2022-01-01 ENCOUNTER — Other Ambulatory Visit: Payer: Self-pay

## 2022-01-01 ENCOUNTER — Ambulatory Visit: Payer: Medicare Other | Admitting: Adult Health

## 2022-01-01 ENCOUNTER — Encounter: Payer: Self-pay | Admitting: Adult Health

## 2022-01-01 VITALS — BP 120/60 | HR 76 | Ht 66.0 in | Wt 245.0 lb

## 2022-01-01 DIAGNOSIS — E7800 Pure hypercholesterolemia, unspecified: Secondary | ICD-10-CM

## 2022-01-01 DIAGNOSIS — E039 Hypothyroidism, unspecified: Secondary | ICD-10-CM | POA: Diagnosis not present

## 2022-01-01 DIAGNOSIS — I10 Essential (primary) hypertension: Secondary | ICD-10-CM

## 2022-01-01 DIAGNOSIS — I358 Other nonrheumatic aortic valve disorders: Secondary | ICD-10-CM | POA: Diagnosis not present

## 2022-01-01 DIAGNOSIS — E1165 Type 2 diabetes mellitus with hyperglycemia: Secondary | ICD-10-CM | POA: Diagnosis not present

## 2022-01-01 NOTE — Patient Instructions (Signed)
Medication Instructions:  No Changes *If you need a refill on your cardiac medications before your next appointment, please call your pharmacy*   Lab Work: No Labs If you have labs (blood work) drawn today and your tests are completely normal, you will receive your results only by: MyChart Message (if you have MyChart) OR A paper copy in the mail If you have any lab test that is abnormal or we need to change your treatment, we will call you to review the results.   Testing/Procedures: No Testing   Follow-Up: At [facility], you and your health needs are our priority.  As part of our continuing mission to provide you with exceptional heart care, we have created designated Provider Care Teams.  These Care Teams include your primary Cardiologist (physician) and Advanced Practice Providers (APPs -  Physician Assistants and Nurse Practitioners) who all work together to provide you with the care you need, when you need it.  We recommend signing up for the patient portal called "MyChart".  Sign up information is provided on this After Visit Summary.  MyChart is used to connect with patients for Virtual Visits (Telemedicine).  Patients are able to view lab/test results, encounter notes, upcoming appointments, etc.  Non-urgent messages can be sent to your provider as well.   To learn more about what you can do with MyChart, go to [email or web address].    Your next appointment:   1 year(s)  The format for your next appointment:   In Person  Provider:   Mihai Croitoru, MD       

## 2022-02-15 DIAGNOSIS — R197 Diarrhea, unspecified: Secondary | ICD-10-CM | POA: Diagnosis not present

## 2022-02-15 DIAGNOSIS — R42 Dizziness and giddiness: Secondary | ICD-10-CM | POA: Diagnosis not present

## 2022-02-15 DIAGNOSIS — I959 Hypotension, unspecified: Secondary | ICD-10-CM | POA: Diagnosis not present

## 2022-04-20 DIAGNOSIS — E039 Hypothyroidism, unspecified: Secondary | ICD-10-CM | POA: Diagnosis not present

## 2022-04-20 DIAGNOSIS — E1165 Type 2 diabetes mellitus with hyperglycemia: Secondary | ICD-10-CM | POA: Diagnosis not present

## 2022-04-20 DIAGNOSIS — E7800 Pure hypercholesterolemia, unspecified: Secondary | ICD-10-CM | POA: Diagnosis not present

## 2022-04-20 DIAGNOSIS — E7801 Familial hypercholesterolemia: Secondary | ICD-10-CM | POA: Diagnosis not present

## 2022-04-27 DIAGNOSIS — E1165 Type 2 diabetes mellitus with hyperglycemia: Secondary | ICD-10-CM | POA: Diagnosis not present

## 2022-04-27 DIAGNOSIS — Z0000 Encounter for general adult medical examination without abnormal findings: Secondary | ICD-10-CM | POA: Diagnosis not present

## 2022-04-27 DIAGNOSIS — I10 Essential (primary) hypertension: Secondary | ICD-10-CM | POA: Diagnosis not present

## 2022-04-27 DIAGNOSIS — E559 Vitamin D deficiency, unspecified: Secondary | ICD-10-CM | POA: Diagnosis not present

## 2022-05-04 DIAGNOSIS — E7800 Pure hypercholesterolemia, unspecified: Secondary | ICD-10-CM | POA: Diagnosis not present

## 2022-05-04 DIAGNOSIS — E1165 Type 2 diabetes mellitus with hyperglycemia: Secondary | ICD-10-CM | POA: Diagnosis not present

## 2022-05-04 DIAGNOSIS — E039 Hypothyroidism, unspecified: Secondary | ICD-10-CM | POA: Diagnosis not present

## 2022-05-04 DIAGNOSIS — I10 Essential (primary) hypertension: Secondary | ICD-10-CM | POA: Diagnosis not present

## 2022-05-31 DIAGNOSIS — N1831 Chronic kidney disease, stage 3a: Secondary | ICD-10-CM | POA: Diagnosis not present

## 2022-05-31 DIAGNOSIS — E039 Hypothyroidism, unspecified: Secondary | ICD-10-CM | POA: Diagnosis not present

## 2022-05-31 DIAGNOSIS — E1165 Type 2 diabetes mellitus with hyperglycemia: Secondary | ICD-10-CM | POA: Diagnosis not present

## 2022-05-31 DIAGNOSIS — E7800 Pure hypercholesterolemia, unspecified: Secondary | ICD-10-CM | POA: Diagnosis not present

## 2022-07-14 DIAGNOSIS — M65321 Trigger finger, right index finger: Secondary | ICD-10-CM | POA: Diagnosis not present

## 2022-07-14 DIAGNOSIS — M7711 Lateral epicondylitis, right elbow: Secondary | ICD-10-CM | POA: Diagnosis not present

## 2022-08-05 DIAGNOSIS — E113293 Type 2 diabetes mellitus with mild nonproliferative diabetic retinopathy without macular edema, bilateral: Secondary | ICD-10-CM | POA: Diagnosis not present

## 2022-08-05 DIAGNOSIS — H43813 Vitreous degeneration, bilateral: Secondary | ICD-10-CM | POA: Diagnosis not present

## 2022-08-05 DIAGNOSIS — H26493 Other secondary cataract, bilateral: Secondary | ICD-10-CM | POA: Diagnosis not present

## 2022-08-05 DIAGNOSIS — H11153 Pinguecula, bilateral: Secondary | ICD-10-CM | POA: Diagnosis not present

## 2022-08-11 ENCOUNTER — Other Ambulatory Visit: Payer: Self-pay | Admitting: Internal Medicine

## 2022-08-11 DIAGNOSIS — Z1231 Encounter for screening mammogram for malignant neoplasm of breast: Secondary | ICD-10-CM

## 2022-08-27 ENCOUNTER — Telehealth: Payer: Self-pay

## 2022-08-27 NOTE — Patient Outreach (Signed)
  Care Coordination   08/27/2022 Name: Sue Davidson MRN: [id number] DOB: 04-Feb-1951   Care Coordination Outreach Attempts:  An unsuccessful telephone outreach was attempted today to offer the patient information about available care coordination services as a benefit of their health plan.   Follow Up Plan:  Additional outreach attempts will be made to offer the patient care coordination information and services.   Encounter Outcome:  No Answer  Care Coordination Interventions Activated:  No   Care Coordination Interventions:  No, not indicated    Jone Baseman, RN, MSN [facility] Care Management Care Management Coordinator Direct Line [phone]

## 2022-09-13 ENCOUNTER — Ambulatory Visit
Admission: RE | Admit: 2022-09-13 | Discharge: 2022-09-13 | Disposition: A | Discharge status: Home / Self Care | Payer: Medicare Other | Source: Ambulatory Visit | Attending: Internal Medicine | Admitting: Internal Medicine

## 2022-09-13 ENCOUNTER — Telehealth: Payer: Self-pay

## 2022-09-13 DIAGNOSIS — Z1231 Encounter for screening mammogram for malignant neoplasm of breast: Secondary | ICD-10-CM | POA: Diagnosis not present

## 2022-09-13 NOTE — Patient Outreach (Signed)
  Care Coordination   09/13/2022 Name: Sue Davidson MRN: [id number] DOB: 12/22/1950   Care Coordination Outreach Attempts:  A second unsuccessful outreach was attempted today to offer the patient with information about available care coordination services as a benefit of their health plan.     Follow Up Plan:  Additional outreach attempts will be made to offer the patient care coordination information and services.   Encounter Outcome:  No Answer  Care Coordination Interventions Activated:  No   Care Coordination Interventions:  No, not indicated    Jone Baseman, RN, MSN [facility] Care Management Care Management Coordinator Direct Line [phone]

## 2022-10-05 ENCOUNTER — Telehealth: Payer: Self-pay

## 2022-10-05 NOTE — Patient Outreach (Signed)
  Care Coordination   10/05/2022 Name: Riko Lumsden MRN: [id number] DOB: 1950-11-21   Care Coordination Outreach Attempts:  A third unsuccessful outreach was attempted today to offer the patient with information about available care coordination services as a benefit of their health plan.   Follow Up Plan:  No further outreach attempts will be made at this time. We have been unable to contact the patient to offer or enroll patient in care coordination services  Encounter Outcome:  No Answer   Care Coordination Interventions:  No, not indicated    Jone Baseman, RN, MSN [facility] Care Management Coordinator Direct Line [phone]

## 2022-12-13 ENCOUNTER — Encounter (HOSPITAL_BASED_OUTPATIENT_CLINIC_OR_DEPARTMENT_OTHER): Payer: Self-pay

## 2022-12-13 ENCOUNTER — Other Ambulatory Visit: Payer: Self-pay

## 2022-12-13 DIAGNOSIS — E119 Type 2 diabetes mellitus without complications: Secondary | ICD-10-CM | POA: Diagnosis not present

## 2022-12-13 DIAGNOSIS — R197 Diarrhea, unspecified: Secondary | ICD-10-CM | POA: Diagnosis not present

## 2022-12-13 DIAGNOSIS — Z7982 Long term (current) use of aspirin: Secondary | ICD-10-CM | POA: Insufficient documentation

## 2022-12-13 DIAGNOSIS — E1165 Type 2 diabetes mellitus with hyperglycemia: Secondary | ICD-10-CM | POA: Diagnosis not present

## 2022-12-13 DIAGNOSIS — E039 Hypothyroidism, unspecified: Secondary | ICD-10-CM | POA: Diagnosis not present

## 2022-12-13 DIAGNOSIS — E7800 Pure hypercholesterolemia, unspecified: Secondary | ICD-10-CM | POA: Diagnosis not present

## 2022-12-13 DIAGNOSIS — I10 Essential (primary) hypertension: Secondary | ICD-10-CM | POA: Diagnosis not present

## 2022-12-13 DIAGNOSIS — Z79899 Other long term (current) drug therapy: Secondary | ICD-10-CM | POA: Diagnosis not present

## 2022-12-13 DIAGNOSIS — R42 Dizziness and giddiness: Secondary | ICD-10-CM | POA: Diagnosis not present

## 2022-12-13 LAB — CBC
HCT: 40 % (ref 36.0–46.0)
Hemoglobin: 13.1 g/dL (ref 12.0–15.0)
MCH: 28.9 pg (ref 26.0–34.0)
MCHC: 32.8 g/dL (ref 30.0–36.0)
MCV: 88.3 fL (ref 80.0–100.0)
Platelets: 219 10*3/uL (ref 150–400)
RBC: 4.53 MIL/uL (ref 3.87–5.11)
RDW: 14.8 % (ref 11.5–15.5)
WBC: 9.3 10*3/uL (ref 4.0–10.5)
nRBC: 0 % (ref 0.0–0.2)

## 2022-12-13 LAB — BASIC METABOLIC PANEL
Anion gap: 11 (ref 5–15)
BUN: 17 mg/dL (ref 8–23)
CO2: 28 mmol/L (ref 22–32)
Calcium: 9.6 mg/dL (ref 8.9–10.3)
Chloride: 99 mmol/L (ref 98–111)
Creatinine, Ser: 0.84 mg/dL (ref 0.44–1.00)
GFR, Estimated: >60 mL/min (ref >60)
Glucose, Bld: 175 mg/dL — ABNORMAL HIGH (ref 70–99)
Potassium: 3.7 mmol/L (ref 3.5–5.1)
Sodium: 138 mmol/L (ref 135–145)

## 2022-12-13 LAB — URINALYSIS, ROUTINE W REFLEX MICROSCOPIC
Bilirubin Urine: NEGATIVE
Glucose, UA: >1000 mg/dL — AB
Hgb urine dipstick: NEGATIVE
Ketones, ur: NEGATIVE mg/dL
Nitrite: NEGATIVE
Specific Gravity, Urine: 1.028 (ref 1.005–1.030)
pH: 5 (ref 5.0–8.0)

## 2022-12-13 NOTE — ED Triage Notes (Signed)
Pt to ED pov from home.  Pt states for past week her blood pressure has been up.  Pt states that 4 days ago pt's blood pressure went down to SBPs=60s. Pt has not felt good, has felt shaky, has had a headache, and tingling in face for past 4 days. Pt called PCP 3 days ago and has an appointment in 1 week.

## 2022-12-14 ENCOUNTER — Emergency Department (HOSPITAL_BASED_OUTPATIENT_CLINIC_OR_DEPARTMENT_OTHER)
Admission: EM | Admit: 2022-12-14 | Discharge: 2022-12-14 | Disposition: A | Discharge status: Home / Self Care | Payer: Medicare Other | Attending: Emergency Medicine | Admitting: Emergency Medicine

## 2022-12-14 DIAGNOSIS — I10 Essential (primary) hypertension: Secondary | ICD-10-CM

## 2022-12-14 NOTE — ED Provider Notes (Signed)
[facility] Provider Note: Sue Spurling, MD, FACEP  CSN: [id number] MRN: [id number] ARRIVAL: 12/13/22 at [facility]: [id number]   CHIEF COMPLAINT  Hypertension   HISTORY OF PRESENT ILLNESS  12/14/22 1:03 AM Sue Davidson is a 72 y.o. female who began feeling lightheaded and shaky starting 8 days ago.  She also has been feeling paresthesias on both sides of her face, primarily in her lower face.  She has had no other neurologic symptoms.  Five days ago she had an episode of diaphoresis.  This was not accompanied by chest pain or shortness of breath.  She checked her blood pressure and found it to be 175/70.  She has continued to check her blood pressure and has found her systolic blood pressure sometimes elevated and her diastolic blood pressure sometimes low (as low as the 60s).  Yesterday she found her blood pressure to be 62/61 which makes her daughter suspect her blood pressure cuff, which is an older model, may not be functioning properly.  Her blood pressure here was noted to be 157/50.   Past Medical History:  Diagnosis Date   Anemia    hx of   Anginal pain (HCC)    hx of   Anxiety    Depression    Diabetes mellitus without complication (HCC)    GERD (gastroesophageal reflux disease)    Headache(784.0)    Heart murmur    Hyperlipidemia    Hypertension    MRSA (methicillin resistant staph aureus) culture positive    many years ago-abdominal wound- no issuses since.   PONV (postoperative nausea and vomiting)    severe   Sleep apnea    Study done -remains under evaluation- no cpap yet.   Vitamin D deficiency     Past Surgical History:  Procedure Laterality Date   ABDOMINAL HYSTERECTOMY  1970's   APPENDECTOMY  1970's   BREAST BIOPSY Left 2018   CARDIAC CATHETERIZATION     CHOLECYSTECTOMY N/A 10/02/2013   Procedure: LAPAROSCOPIC CHOLECYSTECTOMY;  Surgeon: Earnstine Regal, MD;  Location: [facility];  Service: General;  Laterality: N/A;   cyst removed Left     wrist"ganglion"   ELBOW SURGERY Left    HARDWARE REMOVAL Left    ankle   KNEE ARTHROSCOPY Right 06/30/2016   Procedure: ARTHROSCOPY RIGHT KNEE WITH MEDIAL AND LATERAL MENSICAL DEBRIDEMENT;  Surgeon: Gaynelle Arabian, MD;  Location: [facility];  Service: Orthopedics;  Laterality: Right;  LMA   LEG SURGERY Left 1980's   broke leg and ankle   SHOULDER ARTHROSCOPY Bilateral    TONSILLECTOMY  1970's   TUBAL LIGATION  28's    Family History  Problem Relation Age of Onset   Diabetes Mother    Heart disease Mother        CABG age 80s   Cancer Father        throat and lung   Heart disease Father    Cancer Paternal Aunt        breast   Breast cancer Paternal Aunt    Cancer Paternal Aunt        colon    Social History   Tobacco Use   Smoking status: Never   Smokeless tobacco: Never  Vaping Use   Vaping Use: Never used  Substance Use Topics   Alcohol use: No    Alcohol/week: 0.0 standard drinks of alcohol   Drug use: No    Prior to Admission medications   Medication Sig Start Date End Date Taking?  Authorizing Provider  acetaminophen (TYLENOL) 500 MG tablet Take 500 mg by mouth every 6 (six) hours as needed for moderate pain.    [provider]  allopurinol (ZYLOPRIM) 100 MG tablet Take 100 mg by mouth 2 (two) times daily.    [provider]  aspirin 81 MG tablet Take 81 mg by mouth daily.    [provider]  cholecalciferol (VITAMIN D) 1000 units tablet Take 1,000 Units by mouth daily.    [provider]  furosemide (LASIX) 80 MG tablet TAKE 1 TABLET BY MOUTH EVERY DAY 10/30/19   Almyra Deforest, PA  gabapentin (NEURONTIN) 300 MG capsule Take 1 capsule (300 mg total) by mouth 2 (two) times daily. 02/01/20   Magnus Sinning, MD  levothyroxine (SYNTHROID, LEVOTHROID) 75 MCG tablet Take 75 mcg by mouth daily before breakfast.    [provider]  olmesartan (BENICAR) 40 MG tablet TAKE 1 TABLET BY MOUTH EVERY DAY 11/19/19   Croitoru, Mihai, MD   omeprazole (PRILOSEC) 40 MG capsule Take 40 mg by mouth daily. Reported on 01/19/2016 07/18/13   [provider]  ondansetron (ZOFRAN) 8 MG tablet Take 8 mg by mouth 2 (two) times daily as needed for nausea or vomiting.    [provider]  rosuvastatin (CRESTOR) 20 MG tablet Take 20 mg by mouth daily with breakfast.     [provider]  sertraline (ZOLOFT) 100 MG tablet Take 100 mg by mouth daily.    [provider]    Allergies Tape   REVIEW OF SYSTEMS  Negative except as noted here or in the History of Present Illness.   PHYSICAL EXAMINATION  Initial Vital Signs Blood pressure (!) 157/50, pulse 60, temperature 98.6 F (37 C), temperature source Oral, resp. rate 18, height '5\' 6"'$  (1.676 m), weight 108.4 kg, SpO2 97 %.  Examination General: Well-developed, well-nourished female in no acute distress; appearance consistent with age of record HENT: normocephalic; atraumatic Eyes: pupils equal, round and reactive to light; extraocular muscles intact Neck: supple Heart: regular rate and rhythm Lungs: clear to auscultation bilaterally Abdomen: soft; nondistended; nontender; bowel sounds present Extremities: No deformity; full range of motion Neurologic: Awake, alert and oriented; motor function intact in all extremities and symmetric; no facial droop; normal finger-to-nose Skin: Warm and dry Psychiatric: Normal mood and affect   RESULTS  Summary of this visit's results, reviewed and interpreted by myself:   EKG Interpretation  Date/Time:  Monday December 13 2022 19:33:15 EST Ventricular Rate:  61 PR Interval:  174 QRS Duration: 130 QT Interval:  466 QTC Calculation: 469 R Axis:   -67 Text Interpretation: Normal sinus rhythm Left axis deviation Left ventricular hypertrophy with QRS widening and repolarization abnormality ( R in aVL , Cornell product , Romhilt-Estes ) Abnormal ECG When compared with ECG of 14-Apr-2017 04:40, No significant  change was found Confirmed by Regan Lemming ([id number]) on 12/13/2022 9:45:22 PM       Laboratory Studies: Results for orders placed or performed during the hospital encounter of 12/14/22 (from the past 24 hour(s))  Basic metabolic panel     Status: Abnormal   Collection Time: 12/13/22  7:24 PM  Result Value Ref Range   Sodium 138 135 - 145 mmol/L   Potassium 3.7 3.5 - 5.1 mmol/L   Chloride 99 98 - 111 mmol/L   CO2 28 22 - 32 mmol/L   Glucose, Bld 175 (H) 70 - 99 mg/dL   BUN 17 8 - 23 mg/dL   Creatinine,  Ser 0.84 0.44 - 1.00 mg/dL   Calcium 9.6 8.9 - 10.3 mg/dL   GFR, Estimated >60 >60 mL/min   Anion gap 11 5 - 15  CBC     Status: None   Collection Time: 12/13/22  7:24 PM  Result Value Ref Range   WBC 9.3 4.0 - 10.5 K/uL   RBC 4.53 3.87 - 5.11 MIL/uL   Hemoglobin 13.1 12.0 - 15.0 g/dL   HCT 40.0 36.0 - 46.0 %   MCV 88.3 80.0 - 100.0 fL   MCH 28.9 26.0 - 34.0 pg   MCHC 32.8 30.0 - 36.0 g/dL   RDW 14.8 11.5 - 15.5 %   Platelets 219 150 - 400 K/uL   nRBC 0.0 0.0 - 0.2 %  Urinalysis, Routine w reflex microscopic -Urine, Clean Catch     Status: Abnormal   Collection Time: 12/13/22  7:24 PM  Result Value Ref Range   Color, Urine YELLOW YELLOW   APPearance CLEAR CLEAR   Specific Gravity, Urine 1.028 1.005 - 1.030   pH 5.0 5.0 - 8.0   Glucose, UA >1,000 (A) NEGATIVE mg/dL   Hgb urine dipstick NEGATIVE NEGATIVE   Bilirubin Urine NEGATIVE NEGATIVE   Ketones, ur NEGATIVE NEGATIVE mg/dL   Protein, ur TRACE (A) NEGATIVE mg/dL   Nitrite NEGATIVE NEGATIVE   Leukocytes,Ua MODERATE (A) NEGATIVE   RBC / HPF 0-5 0 - 5 RBC/hpf   WBC, UA 6-10 0 - 5 WBC/hpf   Bacteria, UA RARE (A) NONE SEEN   Squamous Epithelial / HPF 11-20 0 - 5 /HPF   Mucus PRESENT    Imaging Studies: No results found.  ED COURSE and MDM  Nursing notes, initial and subsequent vitals signs, including pulse oximetry, reviewed and interpreted by myself.  Vitals:   12/13/22 1922 12/13/22 1926 12/14/22 0117  BP:  (!)  157/50 (!) 183/63  Pulse:  60 (!) 58  Resp:  18 18  Temp:  98.6 F (37 C) 98.3 F (36.8 C)  TempSrc:  Oral Oral  SpO2:  97% 98%  Weight: 108.4 kg    Height: '5\' 6"'$  (1.676 m)     Medications - No data to display  The patient's laboratory studies and EKG are reassuring.  I suspect the urinalysis is contaminated due to the large number of squamous epithelial cells present.  She is not having dysuria.  She has an appointment scheduled with her PCP in 6 days.  She was advised to get a new blood pressure cuff and not to overly worried about her blood pressure.  Because there are no signs of endorgan damage at the present time I do not believe any acute intervention is indicated.  PROCEDURES  Procedures   ED DIAGNOSES     ICD-10-CM   1. Hypertension not at goal  I10          Azzan Butler, Jenny Reichmann, MD 12/14/22 [phone]

## 2022-12-20 DIAGNOSIS — E7800 Pure hypercholesterolemia, unspecified: Secondary | ICD-10-CM | POA: Diagnosis not present

## 2022-12-20 DIAGNOSIS — E559 Vitamin D deficiency, unspecified: Secondary | ICD-10-CM | POA: Diagnosis not present

## 2022-12-20 DIAGNOSIS — E039 Hypothyroidism, unspecified: Secondary | ICD-10-CM | POA: Diagnosis not present

## 2022-12-20 DIAGNOSIS — I10 Essential (primary) hypertension: Secondary | ICD-10-CM | POA: Diagnosis not present

## 2022-12-22 ENCOUNTER — Telehealth: Payer: Self-pay

## 2022-12-22 NOTE — Telephone Encounter (Signed)
        Patient  visited [facility] on 12/22/2022  for hypertension.   Telephone encounter attempt :  1st  A HIPAA compliant voice message was left requesting a return call.  Instructed patient to call back at [phone].   [facility] Resource Care Guide   [email or web address]  ?? [phone]   Website: [email or web address]  [email or web address]

## 2022-12-24 ENCOUNTER — Telehealth: Payer: Self-pay

## 2022-12-24 NOTE — Telephone Encounter (Signed)
        Patient  visited [facility] on 12/14/2022  for hypertension.   Telephone encounter attempt :  2nd  A HIPAA compliant voice message was left requesting a return call.  Instructed patient to call back at [phone].   [place] Resource Care Guide   [email or web address]  ?? [phone]   Website: [email or web address]  [email or web address]

## 2022-12-28 ENCOUNTER — Telehealth: Payer: Self-pay

## 2022-12-28 NOTE — Telephone Encounter (Signed)
        Patient  visited [facility] on 12/14/2022  for hypertension.   Telephone encounter attempt :  3rd  A HIPAA compliant voice message was left requesting a return call.  Instructed patient to call back at [phone].   [place] Guide   [email or web address]  ?? [phone]   Website: [email or web address]  [email or web address]

## 2023-01-07 DIAGNOSIS — E7800 Pure hypercholesterolemia, unspecified: Secondary | ICD-10-CM | POA: Diagnosis not present

## 2023-01-07 DIAGNOSIS — E1165 Type 2 diabetes mellitus with hyperglycemia: Secondary | ICD-10-CM | POA: Diagnosis not present

## 2023-01-07 DIAGNOSIS — E559 Vitamin D deficiency, unspecified: Secondary | ICD-10-CM | POA: Diagnosis not present

## 2023-01-07 DIAGNOSIS — E039 Hypothyroidism, unspecified: Secondary | ICD-10-CM | POA: Diagnosis not present

## 2023-03-10 ENCOUNTER — Ambulatory Visit: Payer: Medicare Other | Admitting: Physical Medicine and Rehabilitation

## 2023-03-10 ENCOUNTER — Encounter: Payer: Self-pay | Admitting: Physical Medicine and Rehabilitation

## 2023-03-10 DIAGNOSIS — M47816 Spondylosis without myelopathy or radiculopathy, lumbar region: Secondary | ICD-10-CM | POA: Diagnosis not present

## 2023-03-10 DIAGNOSIS — M5416 Radiculopathy, lumbar region: Secondary | ICD-10-CM

## 2023-03-10 DIAGNOSIS — M5116 Intervertebral disc disorders with radiculopathy, lumbar region: Secondary | ICD-10-CM | POA: Diagnosis not present

## 2023-03-10 MED ORDER — DIAZEPAM 5 MG PO TABS: ORAL_TABLET | ORAL | 0 refills | Status: DC

## 2023-03-10 NOTE — Progress Notes (Signed)
Sue Davidson - 72 y.o. female MRN [id number]  Date of birth: [birth year]  Office Visit Note: Visit Date: 03/10/2023 PCP: Merri Brunette, MD Referred by: Merri Brunette, MD  Subjective: Chief Complaint  Patient presents with   Lower Back - Pain   HPI: Sue Davidson is a 72 y.o. female who comes in today for evaluation of chronic, worsening and severe bilateral lower back pain radiating to hips, buttocks, and lateral thighs. Patient last seen in our office in 2021. Pain ongoing for several years, worsens with prolonged sitting, walking and standing. Reports difficulty sleeping due to severe pain. She describes pain as sore and aching sensation, currently rates as 8 out of 10. Some relief of pain with home exercise regimen, rest and use of medications. Continues with Gabapentin 300 mg BID. Does attend water aerobics twice a week. History of formal physical therapy with minimal relief of pain. Lumbar MRI imaging from [facility] in 2021 exhibits mild-moderate levoconvex scoliosis, moderate broad right-central foraminal L1-L2 disc protrusion with annual fissure resulting in displacement of right L2 nerve roots.There is also moderate-severe right sided foraminal stenosis at this level displacing exiting right L1 nerve root. She was previously treated by Dr. Sheran Luz whom performed lumbar epidural steroid injection, she reports minimal relief of pain with this procedure. Patient underwent right L1 transforaminal epidural steroid injection on 01/28/2020 in our office, she reports significant relief of pain, greater than 80% with this procedure over the last several years. Today, her symptoms are different from previous issues, states her pain is more bilateral, no groin pain today. Patients couse is complicated by morbid obesity, insulin dependent diabetes mellitus, and pulmonary hypertension. Patient denies focal weakness, numbness and tingling. No recent trauma or falls.    Review of Systems   Musculoskeletal:  Positive for back pain.  Neurological:  Negative for tingling, sensory change, focal weakness and weakness.  All other systems reviewed and are negative.  Otherwise per HPI.  Assessment & Plan: Visit Diagnoses:    ICD-10-CM   1. Lumbar radiculopathy  M54.16 MR LUMBAR SPINE WO CONTRAST    2. Intervertebral disc disorders with radiculopathy, lumbar region  M51.16     3. Facet arthropathy, lumbar  M47.816     4. Morbid obesity (HCC)  E66.01        Plan: Findings:  Chronic, worsening and severe bilateral lower back pain radiating to hips, buttocks, and lateral thighs. Patient continues to have severe pain despite good conservative therapies such as home exercise regimen, rest and use of medications. Patients clinical presentation and exam are complex, differentials include lumbar radiculopathy vs facet mediated pain. Next step is to obtain new lumbar MRI imaging. Depending on results of lumbar MRI imaging we would consider performing lumbar injection. Patient voiced concerns regarding anxiety related to MRI imaging, I did place prescription for pre-procedure Valium for her to take on day injection. I discussed pathology of disc herniations and reabsorption over time, however if pain persists she could benefit from surgical consultation. Could also look at re-grouping with physical therapy. We will have patient  follow up to discuss lumbar MRI results. No red flag symptoms noted upon exam today.     Meds & Orders:  Meds ordered this encounter  Medications   diazepam (VALIUM) 5 MG tablet    Sig: Take one tablet by mouth with food one hour prior to procedure. May repeat 30 minutes prior if needed.    Dispense:  2 tablet    Refill:  0    Orders Placed This Encounter  Procedures   MR LUMBAR SPINE WO CONTRAST    Follow-up: Return for follow up for lumbar MRI review.   Procedures: No procedures performed      Clinical History: No specialty comments available.   She  reports that she has never smoked. She has never used smokeless tobacco. No results for input(s): "HGBA1C", "LABURIC" in the last 8760 hours.  Objective:  VS:  HT:    WT:   BMI:     BP:   HR: bpm  TEMP: ( )  RESP:  Physical Exam Vitals and nursing note reviewed.  HENT:     Head: Normocephalic and atraumatic.     Right Ear: External ear normal.     Left Ear: External ear normal.     Nose: Nose normal.     Mouth/Throat:     Mouth: Mucous membranes are moist.  Eyes:     Extraocular Movements: Extraocular movements intact.  Cardiovascular:     Rate and Rhythm: Normal rate.     Pulses: Normal pulses.  Pulmonary:     Effort: Pulmonary effort is normal.  Abdominal:     General: Abdomen is flat. There is no distension.  Musculoskeletal:        General: Tenderness present.     Cervical back: Normal range of motion.     Comments: Patient is slow to rise from seated position to standing. Good lumbar range of motion. No pain noted with facet loading. 5/5 strength noted with bilateral hip flexion, knee flexion/extension, ankle dorsiflexion/plantarflexion and EHL. No clonus noted bilaterally. No pain upon palpation of greater trochanters. No pain with internal/external rotation of bilateral hips. Sensation intact bilaterally. Negative slump test bilaterally. Ambulates without aid, gait slow.     Skin:    General: Skin is warm and dry.     Capillary Refill: Capillary refill takes less than 2 seconds.  Neurological:     General: No focal deficit present.     Mental Status: She is alert and oriented to person, place, and time.  Psychiatric:        Mood and Affect: Mood normal.        Behavior: Behavior normal.     Ortho Exam  Imaging: No results found.  Past Medical/Family/Surgical/Social History: Medications & Allergies reviewed per EMR, new medications updated. Patient Active Problem List   Diagnosis Date Noted   Chest pain 04/14/2017   AKI (acute kidney injury) (HCC) 04/14/2017    Hyponatremia 04/14/2017   Carotid disease, bilateral (HCC) 10/06/2016   Normal coronary arteries 10/06/2016   Lateral meniscal tear 06/29/2016   Pulmonary artery hypertension (HCC) 04/23/2016   Exertional dyspnea 01/26/2016   Bilateral leg edema 01/26/2016   Morbid obesity (HCC) 01/26/2016   Dyslipidemia 01/26/2016   Chronic cholecystitis 10/25/2013   DYSPNEA 06/05/2009   Insulin dependent diabetes mellitus (HCC) 06/04/2009   OSA (obstructive sleep apnea) 06/04/2009   Essential hypertension 06/04/2009   EDEMA LEG 06/04/2009   Past Medical History:  Diagnosis Date   Anemia    hx of   Anginal pain (HCC)    hx of   Anxiety    Depression    Diabetes mellitus without complication (HCC)    GERD (gastroesophageal reflux disease)    Headache(784.0)    Heart murmur    Hyperlipidemia    Hypertension    MRSA (methicillin resistant staph aureus) culture positive    many years ago-abdominal wound- no issuses since.  PONV (postoperative nausea and vomiting)    severe   Sleep apnea    Study done -remains under evaluation- no cpap yet.   Vitamin D deficiency    Family History  Problem Relation Age of Onset   Diabetes Mother    Heart disease Mother        CABG age 57s   Cancer Father        throat and lung   Heart disease Father    Cancer Paternal Aunt        breast   Breast cancer Paternal Aunt    Cancer Paternal Aunt        colon   Past Surgical History:  Procedure Laterality Date   ABDOMINAL HYSTERECTOMY  1970's   APPENDECTOMY  1970's   BREAST BIOPSY Left 2018   CARDIAC CATHETERIZATION     CHOLECYSTECTOMY N/A 10/02/2013   Procedure: LAPAROSCOPIC CHOLECYSTECTOMY;  Surgeon: Velora Heckler, MD;  Location: [facility];  Service: General;  Laterality: N/A;   cyst removed Left    wrist"ganglion"   ELBOW SURGERY Left    HARDWARE REMOVAL Left    ankle   KNEE ARTHROSCOPY Right 06/30/2016   Procedure: ARTHROSCOPY RIGHT KNEE WITH MEDIAL AND LATERAL MENSICAL DEBRIDEMENT;   Surgeon: Ollen Gross, MD;  Location: [facility];  Service: Orthopedics;  Laterality: Right;  LMA   LEG SURGERY Left 1980's   broke leg and ankle   SHOULDER ARTHROSCOPY Bilateral    TONSILLECTOMY  1970's   TUBAL LIGATION  1970's   Social History   Occupational History   Not on file  Tobacco Use   Smoking status: Never   Smokeless tobacco: Never  Vaping Use   Vaping Use: Never used  Substance and Sexual Activity   Alcohol use: No    Alcohol/week: 0.0 standard drinks of alcohol   Drug use: No   Sexual activity: Yes    Birth control/protection: Spermicide

## 2023-03-10 NOTE — Progress Notes (Signed)
Functional Pain Scale - descriptive words and definitions  Moderate (4)   Constantly aware of pain, can complete ADLs with modification/sleep marginally affected at times/passive distraction is of no use, but active distraction gives some relief. Moderate range order  Average Pain  varies  Lower back pain all the way across that radiates into the buttocks and legs

## 2023-03-29 ENCOUNTER — Ambulatory Visit
Admission: RE | Admit: 2023-03-29 | Discharge: 2023-03-29 | Disposition: A | Discharge status: Home / Self Care | Payer: Medicare Other | Source: Ambulatory Visit | Attending: Physical Medicine and Rehabilitation | Admitting: Physical Medicine and Rehabilitation

## 2023-03-29 DIAGNOSIS — M545 Low back pain, unspecified: Secondary | ICD-10-CM | POA: Diagnosis not present

## 2023-03-29 DIAGNOSIS — M47816 Spondylosis without myelopathy or radiculopathy, lumbar region: Secondary | ICD-10-CM | POA: Diagnosis not present

## 2023-03-29 DIAGNOSIS — M5136 Other intervertebral disc degeneration, lumbar region: Secondary | ICD-10-CM | POA: Diagnosis not present

## 2023-03-29 DIAGNOSIS — M5416 Radiculopathy, lumbar region: Secondary | ICD-10-CM

## 2023-04-06 ENCOUNTER — Telehealth: Payer: Self-pay

## 2023-04-06 NOTE — Telephone Encounter (Signed)
-----   Message from Juanda Chance, NP sent at 04/05/2023  1:14 PM EDT ----- Can we have her follow up for lumbar MRI review? Thanks ----- Message ----- From: Interface, Rad Results In Sent: 04/05/2023   8:52 AM EDT To: Juanda Chance, NP

## 2023-04-06 NOTE — Telephone Encounter (Signed)
Spoke with patient and scheduled OV for 04/07/23.

## 2023-04-07 ENCOUNTER — Ambulatory Visit: Payer: Medicare Other | Admitting: Physical Medicine and Rehabilitation

## 2023-04-07 ENCOUNTER — Encounter: Payer: Self-pay | Admitting: Physical Medicine and Rehabilitation

## 2023-04-07 DIAGNOSIS — M47816 Spondylosis without myelopathy or radiculopathy, lumbar region: Secondary | ICD-10-CM

## 2023-04-07 DIAGNOSIS — M5116 Intervertebral disc disorders with radiculopathy, lumbar region: Secondary | ICD-10-CM

## 2023-04-07 DIAGNOSIS — M5416 Radiculopathy, lumbar region: Secondary | ICD-10-CM

## 2023-04-07 NOTE — Progress Notes (Signed)
Functional Pain Scale - descriptive words and definitions  Distressing (6)    Pain is present/unable to complete most ADLs limited by pain/sleep is difficult and active distraction is only marginal. Moderate range order  Average Pain 9  MRI review. Lower back pain that radiates into the front of the left leg

## 2023-04-07 NOTE — Progress Notes (Signed)
Sue Davidson - 72 y.o. female MRN [id number]  Date of birth: Mar 02, 1951  Office Visit Note: Visit Date: 04/07/2023 PCP: Merri Brunette, MD Referred by: Merri Brunette, MD  Subjective: Chief Complaint  Patient presents with   Lower Back - Pain   HPI: Sue Davidson is a 72 y.o. female who comes in today for evaluation of chronic, worsening and severe bilateral lower back pain radiating to the buttocks and down to both lateral thighs.  Pain ongoing for several years and worsens with prolonged sitting, walking and standing. Reports difficulty sleeping due to severe pain. She describes pain as sore and aching sensation, currently rates as 8 out of 10. Some relief of pain with home exercise regimen, rest and use of medications.  Does attend water aerobics when able. History of formal physical therapy with minimal relief of pain.  Recent lumbar MRI imaging exhibits levocurvature of lumbar spine, multilevel degenerative changes most prominent at L1-L2 and L2-L3. There is new left foraminal/extraforaminal disc protrusion at L2-L3.  No high-grade spinal canal stenosis. Patient underwent right L1 transforaminal epidural steroid injection on 01/28/2020 in our office, she reports significant relief of pain, greater than 80% with this procedure over the last several years. This injection was more for small left extraforaminal protrusion at L1-L2 present on lumbar MRI imaging from 2008. Patients couse is complicated by morbid obesity, insulin dependent diabetes mellitus, and pulmonary hypertension. Patient denies focal weakness, numbness and tingling. No recent trauma or falls.       Review of Systems  Musculoskeletal:  Positive for back pain.  Neurological:  Negative for tingling, sensory change, focal weakness and weakness.  All other systems reviewed and are negative.  Otherwise per HPI.  Assessment & Plan: Visit Diagnoses:    ICD-10-CM   1. Lumbar radiculopathy  M54.16 Ambulatory referral to  Physical Medicine Rehab    2. Intervertebral disc disorders with radiculopathy, lumbar region  M51.16 Ambulatory referral to Physical Medicine Rehab    3. Facet arthropathy, lumbar  M47.816 Ambulatory referral to Physical Medicine Rehab    4. Morbid obesity (HCC)  E66.01 Ambulatory referral to Physical Medicine Rehab       Plan: Findings:  Chronic, worsening and severe bilateral lower back pain radiating to the buttocks and down to both lateral thighs.  Patient continues to have severe pain despite good conservative therapies such as formal physical therapy, home exercise regimen, rest and use of medications.  Patient's clinical presentation and exam are consistent with L3 nerve pattern.  I discussed recent lumbar MRI with patient today using imaging and spine model.  Next step is to perform diagnostic and hopefully therapeutic left L3-L4 interlaminar epidural steroid injection under fluoroscopic guidance.  She is not currently taking anticoagulant medication.  If good relief of pain with injection we can repeat this procedure infrequently as needed.  I discussed injection procedure in detail today, she does voice concerns with anxiety related to procedure.  States she does have Xanax at home that she is able to take before the procedure.  I encouraged patient to remain active as tolerated, she can continue with aquatic therapy as well.  We will see patient back for injection.  No red flag symptoms noted upon exam today.    Meds & Orders: No orders of the defined types were placed in this encounter.   Orders Placed This Encounter  Procedures   Ambulatory referral to Physical Medicine Rehab    Follow-up: Return for Left L3-L4 interlaminar epidural steroid  injection.   Procedures: No procedures performed      Clinical History: MRI LUMBAR SPINE WITHOUT CONTRAST    Unless otherwise stated, the level by level findings below have not significantly changed from the prior MRI of 09/26/2007.    Multilevel disc degeneration, progressed from the prior MRI. Disc degeneration is greatest to the right at L1-L2 (moderate) and to the right at L2-L3 (moderate/advanced).   T11-T12: This level is imaged in the sagittal plane only. Disc bulge, new from the prior MRI. Mild facet arthrosis. No significant spinal canal or foraminal stenosis.   T12-L1: No significant disc herniation or stenosis. Trace right facet joint effusion.   L1-L2: Disc bulge, new from the prior MRI. Left foraminal disc protrusion, progressed from the prior MRI. Mild to moderate facet arthrosis and ligamentum flavum hypertrophy, new from the prior MRI. No significant spinal canal stenosis. Mild relative left inferior neural foraminal narrowing, unchanged. The left foraminal disc protrusion may contact the undersurface of the exiting left L1 nerve root within the neural foramen. Moderate right neural foraminal narrowing, new from the prior MRI the left foraminal.   L2-L3: Slight grade 1 retrolisthesis, new from the prior MRI. Progressive disc bulge. New superimposed broad-based left foraminal/extraforaminal disc protrusion. Endplate osteophytes, predominantly along the right aspect of the disc space and new from the prior MRI. Mild-to-moderate facet arthrosis and ligamentum flavum hypertrophy, new from the prior MRI. Mild effacement of the ventral thecal sac (without appreciable nerve root impingement). Mild relative left inferior neural foraminal narrowing, new from the prior MRI. The left foraminal/extraforaminal disc protrusion may contact the undersurface of the exiting left L2 nerve root within and beyond the left neural foramen.   L3-L4: Slight grade 1 anterolisthesis. Progressive disc bulge. Facet arthrosis (mild moderate right, moderate left) with ligamentum flavum hypertrophy, progressed. Trace right facet joint effusion, new from the prior MRI. Minimal relative left subarticular narrowing (without  nerve root impingement), new from the prior MRI. Mild relative left neural foraminal narrowing, also new from the prior MRI.   L4-L5: Disc bulge. Progressive facet hypertrophy. Mild ligamentum flavum thickening on the left. No significant spinal canal or foraminal stenosis.   L5-S1: Mild facet arthrosis, progressed. No significant disc herniation or stenosis.   IMPRESSION: 1. Lumbar spondylosis, as outlined and having progressed at multiple levels since the prior MRI of 09/26/2007. Findings are most notably as follows. 2. At L1-L2, there is progressive moderate disc degeneration. A left foraminal disc protrusion contributes to mild relative left inferior neural foraminal narrowing. The disc protrusion may contact the undersurface of the exiting left L1 nerve root within the neural foramen. Moderate right neural foraminal narrowing, new from the prior MRI. 3. At L2-L3, there is progressive moderate/advanced disc degeneration. A new broad-based left foraminal/extraforaminal disc protrusion contributes to mild relative left inferior neural foraminal narrowing. The disc protrusion may contact the undersurface of the exiting left L2 nerve root within and beyond the left neural foramen. 4. No more than mild spinal canal or neural foraminal narrowing at the remaining levels. 5. Multilevel facet arthrosis, greatest on the left at L3-L4 (moderate at this site). 6. Degenerative endplate edema at Z6-X0 (mild) and L2-L3 (moderate). 7. Levocurvature of the lumbar spine. 8. Slight grade 1 spondylolisthesis at L2-L3, L3-L4 and L4-L5.    Electronically Signed   By: Jackey Loge D.O.   On: 04/05/2023 08:50   She reports that she has never smoked. She has never used smokeless tobacco. No results for input(s): "HGBA1C", "LABURIC" in  the last 8760 hours.  Objective:  VS:  HT:    WT:   BMI:     BP:   HR: bpm  TEMP: ( )  RESP:  Physical Exam Vitals and nursing note reviewed.  HENT:      Head: Normocephalic and atraumatic.     Right Ear: External ear normal.     Left Ear: External ear normal.     Nose: Nose normal.     Mouth/Throat:     Mouth: Mucous membranes are moist.  Eyes:     Extraocular Movements: Extraocular movements intact.  Cardiovascular:     Rate and Rhythm: Normal rate.     Pulses: Normal pulses.  Pulmonary:     Effort: Pulmonary effort is normal.  Abdominal:     General: Abdomen is flat. There is no distension.  Musculoskeletal:        General: Tenderness present.     Cervical back: Normal range of motion.     Comments: Patient is slow to rise from sitting position. Good lumbar range of motion. No pain noted with facet loading. 5/5 strength noted with bilateral hip flexion, knee flexion/extension, ankle dorsiflexion/plantarflexion and EHL. No clonus noted bilaterally. No pain upon palpation of greater trochanters. No pain with internal/external rotation of bilateral hips. Sensation intact bilaterally. Dysesthesias noted to bilateral L3 dermatomes. Negative slump test bilaterally. Ambulates without aid, gait steady.     Skin:    General: Skin is warm and dry.     Capillary Refill: Capillary refill takes less than 2 seconds.  Neurological:     General: No focal deficit present.     Mental Status: She is alert and oriented to person, place, and time.  Psychiatric:        Mood and Affect: Mood normal.        Behavior: Behavior normal.     Ortho Exam  Imaging: No results found.  Past Medical/Family/Surgical/Social History: Medications & Allergies reviewed per EMR, new medications updated. Patient Active Problem List   Diagnosis Date Noted   Chest pain 04/14/2017   AKI (acute kidney injury) (HCC) 04/14/2017   Hyponatremia 04/14/2017   Carotid disease, bilateral (HCC) 10/06/2016   Normal coronary arteries 10/06/2016   Lateral meniscal tear 06/29/2016   Pulmonary artery hypertension (HCC) 04/23/2016   Exertional dyspnea 01/26/2016   Bilateral  leg edema 01/26/2016   Morbid obesity (HCC) 01/26/2016   Dyslipidemia 01/26/2016   Chronic cholecystitis 10/25/2013   DYSPNEA 06/05/2009   Insulin dependent diabetes mellitus (HCC) 06/04/2009   OSA (obstructive sleep apnea) 06/04/2009   Essential hypertension 06/04/2009   EDEMA LEG 06/04/2009   Past Medical History:  Diagnosis Date   Anemia    hx of   Anginal pain (HCC)    hx of   Anxiety    Depression    Diabetes mellitus without complication (HCC)    GERD (gastroesophageal reflux disease)    Headache(784.0)    Heart murmur    Hyperlipidemia    Hypertension    MRSA (methicillin resistant staph aureus) culture positive    many years ago-abdominal wound- no issuses since.   PONV (postoperative nausea and vomiting)    severe   Sleep apnea    Study done -remains under evaluation- no cpap yet.   Vitamin D deficiency    Family History  Problem Relation Age of Onset   Diabetes Mother    Heart disease Mother        CABG age 48s   Cancer  Father        throat and lung   Heart disease Father    Cancer Paternal Aunt        breast   Breast cancer Paternal Aunt    Cancer Paternal Aunt        colon   Past Surgical History:  Procedure Laterality Date   ABDOMINAL HYSTERECTOMY  1970's   APPENDECTOMY  1970's   BREAST BIOPSY Left 2018   CARDIAC CATHETERIZATION     CHOLECYSTECTOMY N/A 10/02/2013   Procedure: LAPAROSCOPIC CHOLECYSTECTOMY;  Surgeon: Velora Heckler, MD;  Location: [facility];  Service: General;  Laterality: N/A;   cyst removed Left    wrist"ganglion"   ELBOW SURGERY Left    HARDWARE REMOVAL Left    ankle   KNEE ARTHROSCOPY Right 06/30/2016   Procedure: ARTHROSCOPY RIGHT KNEE WITH MEDIAL AND LATERAL MENSICAL DEBRIDEMENT;  Surgeon: Ollen Gross, MD;  Location: [facility];  Service: Orthopedics;  Laterality: Right;  LMA   LEG SURGERY Left 1980's   broke leg and ankle   SHOULDER ARTHROSCOPY Bilateral    TONSILLECTOMY  1970's   TUBAL LIGATION  1970's   Social History    Occupational History   Not on file  Tobacco Use   Smoking status: Never   Smokeless tobacco: Never  Vaping Use   Vaping Use: Never used  Substance and Sexual Activity   Alcohol use: No    Alcohol/week: 0.0 standard drinks of alcohol   Drug use: No   Sexual activity: Yes    Birth control/protection: Spermicide

## 2023-04-25 ENCOUNTER — Ambulatory Visit: Payer: Medicare Other | Admitting: Physical Medicine and Rehabilitation

## 2023-04-25 ENCOUNTER — Other Ambulatory Visit: Payer: Self-pay

## 2023-04-25 VITALS — BP 130/63 | HR 59

## 2023-04-25 DIAGNOSIS — M5416 Radiculopathy, lumbar region: Secondary | ICD-10-CM

## 2023-04-25 MED ORDER — METHYLPREDNISOLONE ACETATE 80 MG/ML IJ SUSP
80.0000 mg | Freq: Once | INTRAMUSCULAR | Status: AC
  Administered 2023-04-25: 80 mg

## 2023-04-25 NOTE — Progress Notes (Signed)
Functional Pain Scale - descriptive words and definitions  Distressing (6)    Pain is present/unable to complete most ADLs limited by pain/sleep is difficult and active distraction is only marginal. Moderate range order  Average Pain 5   +Driver, -BT, -Dye Allergies.  Lower back pain on left side that radiates into left leg

## 2023-04-25 NOTE — Patient Instructions (Signed)

## 2023-04-28 DIAGNOSIS — I10 Essential (primary) hypertension: Secondary | ICD-10-CM | POA: Diagnosis not present

## 2023-04-28 DIAGNOSIS — E559 Vitamin D deficiency, unspecified: Secondary | ICD-10-CM | POA: Diagnosis not present

## 2023-04-28 DIAGNOSIS — E039 Hypothyroidism, unspecified: Secondary | ICD-10-CM | POA: Diagnosis not present

## 2023-04-28 DIAGNOSIS — E1165 Type 2 diabetes mellitus with hyperglycemia: Secondary | ICD-10-CM | POA: Diagnosis not present

## 2023-04-28 DIAGNOSIS — E7800 Pure hypercholesterolemia, unspecified: Secondary | ICD-10-CM | POA: Diagnosis not present

## 2023-04-29 LAB — LAB REPORT - SCANNED
A1c: 10
EGFR: 68

## 2023-05-04 NOTE — Progress Notes (Signed)
Sue Davidson - 72 y.o. female MRN [id number]  Date of birth: March 03, 1951  Office Visit Note: Visit Date: 04/25/2023 PCP: Merri Brunette, MD Referred by: Merri Brunette, MD  Subjective: Chief Complaint  Patient presents with   Lower Back - Pain   HPI:  Sue Davidson is a 72 y.o. female who comes in today at the request of Ellin Goodie, FNP for planned Left L3-4 Lumbar Interlaminar epidural steroid injection with fluoroscopic guidance.  The patient has failed conservative care including home exercise, medications, time and activity modification.  This injection will be diagnostic and hopefully therapeutic.  Please see requesting physician notes for further details and justification.   ROS Otherwise per HPI.  Assessment & Plan: Visit Diagnoses:    ICD-10-CM   1. Lumbar radiculopathy  M54.16 XR C-ARM NO REPORT    Epidural Steroid injection    methylPREDNISolone acetate (DEPO-MEDROL) injection 80 mg      Plan: No additional findings.   Meds & Orders:  Meds ordered this encounter  Medications   methylPREDNISolone acetate (DEPO-MEDROL) injection 80 mg    Orders Placed This Encounter  Procedures   XR C-ARM NO REPORT   Epidural Steroid injection    Follow-up: No follow-ups on file.   Procedures: No procedures performed  Lumbar Epidural Steroid Injection - Interlaminar Approach with Fluoroscopic Guidance  Patient: Sue Davidson      Date of Birth: Jun 29, 1951 MRN: [id number] PCP: Merri Brunette, MD      Visit Date: 04/25/2023   Universal Protocol:     Consent Given By: the patient  Position: PRONE  Additional Comments: Vital signs were monitored before and after the procedure. Patient was prepped and draped in the usual sterile fashion. The correct patient, procedure, and site was verified.   Injection Procedure Details:   Procedure diagnoses: Lumbar radiculopathy [M54.16]   Meds Administered:  Meds ordered this encounter  Medications    methylPREDNISolone acetate (DEPO-MEDROL) injection 80 mg     Laterality: Left  Location/Site:  L3-4  Needle: 3.5 in., 20 ga. Tuohy  Needle Placement: Paramedian epidural  Findings:   -Comments: Excellent flow of contrast into the epidural space.  Procedure Details: Using a paramedian approach from the side mentioned above, the region overlying the inferior lamina was localized under fluoroscopic visualization and the soft tissues overlying this structure were infiltrated with 4 ml. of 1% Lidocaine without Epinephrine. The Tuohy needle was inserted into the epidural space using a paramedian approach.   The epidural space was localized using loss of resistance along with counter oblique bi-planar fluoroscopic views.  After negative aspirate for air, blood, and CSF, a 2 ml. volume of Isovue-250 was injected into the epidural space and the flow of contrast was observed. Radiographs were obtained for documentation purposes.    The injectate was administered into the level noted above.   Additional Comments:  No complications occurred Dressing: 2 x 2 sterile gauze and Band-Aid    Post-procedure details: Patient was observed during the procedure. Post-procedure instructions were reviewed.  Patient left the clinic in stable condition.   Clinical History: MRI LUMBAR SPINE WITHOUT CONTRAST    Unless otherwise stated, the level by level findings below have not significantly changed from the prior MRI of 09/26/2007.   Multilevel disc degeneration, progressed from the prior MRI. Disc degeneration is greatest to the right at L1-L2 (moderate) and to the right at L2-L3 (moderate/advanced).   T11-T12: This level is imaged in the sagittal plane only. Disc bulge,  new from the prior MRI. Mild facet arthrosis. No significant spinal canal or foraminal stenosis.   T12-L1: No significant disc herniation or stenosis. Trace right facet joint effusion.   L1-L2: Disc bulge, new from the prior  MRI. Left foraminal disc protrusion, progressed from the prior MRI. Mild to moderate facet arthrosis and ligamentum flavum hypertrophy, new from the prior MRI. No significant spinal canal stenosis. Mild relative left inferior neural foraminal narrowing, unchanged. The left foraminal disc protrusion may contact the undersurface of the exiting left L1 nerve root within the neural foramen. Moderate right neural foraminal narrowing, new from the prior MRI the left foraminal.   L2-L3: Slight grade 1 retrolisthesis, new from the prior MRI. Progressive disc bulge. New superimposed broad-based left foraminal/extraforaminal disc protrusion. Endplate osteophytes, predominantly along the right aspect of the disc space and new from the prior MRI. Mild-to-moderate facet arthrosis and ligamentum flavum hypertrophy, new from the prior MRI. Mild effacement of the ventral thecal sac (without appreciable nerve root impingement). Mild relative left inferior neural foraminal narrowing, new from the prior MRI. The left foraminal/extraforaminal disc protrusion may contact the undersurface of the exiting left L2 nerve root within and beyond the left neural foramen.   L3-L4: Slight grade 1 anterolisthesis. Progressive disc bulge. Facet arthrosis (mild moderate right, moderate left) with ligamentum flavum hypertrophy, progressed. Trace right facet joint effusion, new from the prior MRI. Minimal relative left subarticular narrowing (without nerve root impingement), new from the prior MRI. Mild relative left neural foraminal narrowing, also new from the prior MRI.   L4-L5: Disc bulge. Progressive facet hypertrophy. Mild ligamentum flavum thickening on the left. No significant spinal canal or foraminal stenosis.   L5-S1: Mild facet arthrosis, progressed. No significant disc herniation or stenosis.   IMPRESSION: 1. Lumbar spondylosis, as outlined and having progressed at multiple levels since the prior MRI  of 09/26/2007. Findings are most notably as follows. 2. At L1-L2, there is progressive moderate disc degeneration. A left foraminal disc protrusion contributes to mild relative left inferior neural foraminal narrowing. The disc protrusion may contact the undersurface of the exiting left L1 nerve root within the neural foramen. Moderate right neural foraminal narrowing, new from the prior MRI. 3. At L2-L3, there is progressive moderate/advanced disc degeneration. A new broad-based left foraminal/extraforaminal disc protrusion contributes to mild relative left inferior neural foraminal narrowing. The disc protrusion may contact the undersurface of the exiting left L2 nerve root within and beyond the left neural foramen. 4. No more than mild spinal canal or neural foraminal narrowing at the remaining levels. 5. Multilevel facet arthrosis, greatest on the left at L3-L4 (moderate at this site). 6. Degenerative endplate edema at W0-J8 (mild) and L2-L3 (moderate). 7. Levocurvature of the lumbar spine. 8. Slight grade 1 spondylolisthesis at L2-L3, L3-L4 and L4-L5.    Electronically Signed   By: Jackey Loge D.O.   On: 04/05/2023 08:50     Objective:  VS:  HT:    WT:   BMI:     BP:130/63  HR:(!) 59bpm  TEMP: ( )  RESP:  Physical Exam Vitals and nursing note reviewed.  Constitutional:      General: She is not in acute distress.    Appearance: Normal appearance. She is obese. She is not ill-appearing.  HENT:     Head: Normocephalic and atraumatic.     Right Ear: External ear normal.     Left Ear: External ear normal.  Eyes:     Extraocular Movements: Extraocular movements intact.  Cardiovascular:     Rate and Rhythm: Normal rate.     Pulses: Normal pulses.  Pulmonary:     Effort: Pulmonary effort is normal. No respiratory distress.  Abdominal:     General: There is no distension.     Palpations: Abdomen is soft.  Musculoskeletal:        General: Tenderness present.      Cervical back: Neck supple.     Right lower leg: No edema.     Left lower leg: No edema.     Comments: Patient has good distal strength with no pain over the greater trochanters.  No clonus or focal weakness.  Skin:    Findings: No erythema, lesion or rash.  Neurological:     General: No focal deficit present.     Mental Status: She is alert and oriented to person, place, and time.     Sensory: No sensory deficit.     Motor: No weakness or abnormal muscle tone.     Coordination: Coordination normal.  Psychiatric:        Mood and Affect: Mood normal.        Behavior: Behavior normal.      Imaging: No results found.

## 2023-05-04 NOTE — Procedures (Signed)
Lumbar Epidural Steroid Injection - Interlaminar Approach with Fluoroscopic Guidance  Patient: Sue Davidson      Date of Birth: [birth year] MRN: [id number] PCP: Merri Brunette, MD      Visit Date: 04/25/2023   Universal Protocol:     Consent Given By: the patient  Position: PRONE  Additional Comments: Vital signs were monitored before and after the procedure. Patient was prepped and draped in the usual sterile fashion. The correct patient, procedure, and site was verified.   Injection Procedure Details:   Procedure diagnoses: Lumbar radiculopathy [M54.16]   Meds Administered:  Meds ordered this encounter  Medications   methylPREDNISolone acetate (DEPO-MEDROL) injection 80 mg     Laterality: Left  Location/Site:  L3-4  Needle: 3.5 in., 20 ga. Tuohy  Needle Placement: Paramedian epidural  Findings:   -Comments: Excellent flow of contrast into the epidural space.  Procedure Details: Using a paramedian approach from the side mentioned above, the region overlying the inferior lamina was localized under fluoroscopic visualization and the soft tissues overlying this structure were infiltrated with 4 ml. of 1% Lidocaine without Epinephrine. The Tuohy needle was inserted into the epidural space using a paramedian approach.   The epidural space was localized using loss of resistance along with counter oblique bi-planar fluoroscopic views.  After negative aspirate for air, blood, and CSF, a 2 ml. volume of Isovue-250 was injected into the epidural space and the flow of contrast was observed. Radiographs were obtained for documentation purposes.    The injectate was administered into the level noted above.   Additional Comments:  No complications occurred Dressing: 2 x 2 sterile gauze and Band-Aid    Post-procedure details: Patient was observed during the procedure. Post-procedure instructions were reviewed.  Patient left the clinic in stable condition.

## 2023-05-05 DIAGNOSIS — R748 Abnormal levels of other serum enzymes: Secondary | ICD-10-CM | POA: Diagnosis not present

## 2023-05-05 DIAGNOSIS — I700 Atherosclerosis of aorta: Secondary | ICD-10-CM | POA: Diagnosis not present

## 2023-05-05 DIAGNOSIS — E559 Vitamin D deficiency, unspecified: Secondary | ICD-10-CM | POA: Diagnosis not present

## 2023-05-05 DIAGNOSIS — Z0000 Encounter for general adult medical examination without abnormal findings: Secondary | ICD-10-CM | POA: Diagnosis not present

## 2023-05-05 DIAGNOSIS — E7800 Pure hypercholesterolemia, unspecified: Secondary | ICD-10-CM | POA: Diagnosis not present

## 2023-05-05 DIAGNOSIS — I10 Essential (primary) hypertension: Secondary | ICD-10-CM | POA: Diagnosis not present

## 2023-06-08 DIAGNOSIS — M25561 Pain in right knee: Secondary | ICD-10-CM | POA: Diagnosis not present

## 2023-06-15 DIAGNOSIS — M25561 Pain in right knee: Secondary | ICD-10-CM | POA: Diagnosis not present

## 2023-07-13 DIAGNOSIS — S0990XA Unspecified injury of head, initial encounter: Secondary | ICD-10-CM | POA: Diagnosis not present

## 2023-07-13 DIAGNOSIS — R519 Headache, unspecified: Secondary | ICD-10-CM | POA: Diagnosis not present

## 2023-07-13 DIAGNOSIS — R55 Syncope and collapse: Secondary | ICD-10-CM | POA: Diagnosis not present

## 2023-07-14 ENCOUNTER — Other Ambulatory Visit: Payer: Self-pay | Admitting: Registered Nurse

## 2023-07-14 DIAGNOSIS — S0990XA Unspecified injury of head, initial encounter: Secondary | ICD-10-CM

## 2023-07-20 ENCOUNTER — Other Ambulatory Visit (INDEPENDENT_AMBULATORY_CARE_PROVIDER_SITE_OTHER): Payer: Medicare Other

## 2023-07-20 ENCOUNTER — Ambulatory Visit: Payer: Medicare Other | Attending: Cardiovascular Disease | Admitting: Cardiovascular Disease

## 2023-07-20 ENCOUNTER — Encounter: Payer: Self-pay | Admitting: Cardiovascular Disease

## 2023-07-20 VITALS — BP 178/80 | HR 81 | Ht 66.0 in | Wt 237.2 lb

## 2023-07-20 DIAGNOSIS — Z794 Long term (current) use of insulin: Secondary | ICD-10-CM

## 2023-07-20 DIAGNOSIS — G4733 Obstructive sleep apnea (adult) (pediatric): Secondary | ICD-10-CM

## 2023-07-20 DIAGNOSIS — I5032 Chronic diastolic (congestive) heart failure: Secondary | ICD-10-CM

## 2023-07-20 DIAGNOSIS — R011 Cardiac murmur, unspecified: Secondary | ICD-10-CM

## 2023-07-20 DIAGNOSIS — R0989 Other specified symptoms and signs involving the circulatory and respiratory systems: Secondary | ICD-10-CM

## 2023-07-20 DIAGNOSIS — R55 Syncope and collapse: Secondary | ICD-10-CM

## 2023-07-20 DIAGNOSIS — I350 Nonrheumatic aortic (valve) stenosis: Secondary | ICD-10-CM | POA: Diagnosis not present

## 2023-07-20 DIAGNOSIS — I2721 Secondary pulmonary arterial hypertension: Secondary | ICD-10-CM

## 2023-07-20 DIAGNOSIS — E7800 Pure hypercholesterolemia, unspecified: Secondary | ICD-10-CM

## 2023-07-20 DIAGNOSIS — I10 Essential (primary) hypertension: Secondary | ICD-10-CM

## 2023-07-20 DIAGNOSIS — E119 Type 2 diabetes mellitus without complications: Secondary | ICD-10-CM

## 2023-07-20 NOTE — Progress Notes (Unsigned)
Enrolled for [employer] to mail a ZIO AT Live Telemetry monitor to patients address on file.  

## 2023-07-20 NOTE — Patient Instructions (Signed)
Medication Instructions:  No changes *If you need a refill on your cardiac medications before your next appointment, please call your pharmacy*  Testing/Procedures: Your physician has requested that you have an echocardiogram. Echocardiography is a painless test that uses sound waves to create images of your heart. It provides your doctor with information about the size and shape of your heart and how well your heart's chambers and valves are working. This procedure takes approximately one hour. There are no restrictions for this procedure. Please do NOT wear cologne, perfume, aftershave, or lotions (deodorant is allowed). Please arrive 15 minutes prior to your appointment time.   Your physician has requested that you have a carotid duplex. This test is an ultrasound of the carotid arteries in your neck. It looks at blood flow through these arteries that supply the brain with blood. Allow one hour for this exam. There are no restrictions or special instructions.   Your physician has recommended that you wear a 14 DAY ZIO-PATCH monitor. The Zio patch cardiac monitor continuously records heart rhythm data for up to 14 days, this is for patients being evaluated for multiple types heart rhythms. For the first 24 hours post application, please avoid getting the Zio monitor wet in the shower or by excessive sweating during exercise. After that, feel free to carry on with regular activities. Keep soaps and lotions away from the ZIO XT Patch.  This will be mailed to you, please expect 7-10 days to receive.    Applying the monitor   Shave hair from upper left chest.   Hold abrader disc by orange tab.  Rub abrader in 40 strokes over left upper chest as indicated in your monitor instructions.   Clean area with 4 enclosed alcohol pads .  Use all pads to assure are is cleaned thoroughly.  Let dry.   Apply patch as indicated in monitor instructions.  Patch will be place under collarbone on left side of chest  with arrow pointing upward.   Rub patch adhesive wings for 2 minutes.Remove white label marked "1".  Remove white label marked "2".  Rub patch adhesive wings for 2 additional minutes.   While looking in a mirror, press and release button in center of patch.  A small green light will flash 3-4 times .  This will be your only indicator the monitor has been turned on.     Do not shower for the first 24 hours.  You may shower after the first 24 hours.   Press button if you feel a symptom. You will hear a small click.  Record Date, Time and Symptom in the Patient Log Book.   When you are ready to remove patch, follow instructions on last 2 pages of Patient Log Book.  Stick patch monitor onto last page of Patient Log Book.   Place Patient Log Book in [place] box.  Use locking tab on box and tape box closed securely.  The [employer] has [employer] on it.  Please place in mailbox as soon as possible.  Your physician should have your test results approximately 7 days after the monitor has been mailed back to [facility].   Call [facility] Customer Care at [phone] if you have questions regarding your ZIO XT patch monitor.  Call them immediately if you see an orange light blinking on your monitor.   If your monitor falls off in less than 4 days contact our Monitor department at [phone].  If your monitor becomes loose  or falls off after 4 days call [employer] at [phone] for suggestions on securing your monitor    Follow-Up: At [facility], you and your health needs are our priority.  As part of our continuing mission to provide you with exceptional heart care, we have created designated Provider Care Teams.  These Care Teams include your primary Cardiologist (physician) and Advanced Practice Providers (APPs -  Physician Assistants and Nurse Practitioners) who all work together to provide you with the care you need, when you need it.  We recommend signing up for  the patient portal called "MyChart".  Sign up information is provided on this After Visit Summary.  MyChart is used to connect with patients for Virtual Visits (Telemedicine).  Patients are able to view lab/test results, encounter notes, upcoming appointments, etc.  Non-urgent messages can be sent to your provider as well.   To learn more about what you can do with MyChart, go to [email or web address].    Your next appointment:   4-5 month(s)  Provider:   Thurmon Fair, MD

## 2023-07-20 NOTE — Progress Notes (Signed)
Patient ID: Sue Davidson, female   DOB: [birth year], 72 y.o.   MRN: [id number]    Cardiology Office Note    Date:  07/22/2023   ID:  Sue, Davidson 09-19-1951, MRN [id number]  PCP:  Merri Brunette, MD  Cardiologist:   Thurmon Fair, MD   Chief Complaint  Patient presents with   Loss of Consciousness    History of Present Illness:  Sue Davidson is a 72 y.o. female with severe obesity and obstructive sleep apnea, complaints of bilateral lower extremity edema, nonobstructive CAD per cath in 2012, very mild aortic stenosis by echo in 2018, hypertension, hyperlipidemia, normal LV systolic function, minimal signs of diastolic dysfunction, function and mild pulmonary hypertension by echo (2018).  About 5 weeks ago she had an episode of unheralded syncope.  She remembers standing in her bedroom at the side of the bed and then she simply found herself lying on the floor looking up at the ceiling, but had no recollection of dizziness, change in vision, dyspnea or chest pain, palpitations, diaphoresis, nausea or other prodromal symptoms.  She is not sure how long she was unconscious.  She did not think to check her blood sugar, but she has not had any symptoms of hypoglycemia.  She has not had problems with palpitations in general.  She continues to have NYHA functional class II exertional dyspnea and chronic lower extremity edema.  She describes her blood pressure is variable, but rarely low.  The lowest blood pressure she is seen was 101/61 mmHg, when checked at home.  Just last week her blood pressure was 130/64.  Today in the clinic there appears to be a difference in blood pressure readings: 150/80 on the left, 120/70 on the right.  She does have a right subclavian bruit and possibly a separate right carotid bruit.  Carotid duplex ultrasonography performed in 2018 showed bilateral carotid plaque without meaningful obstruction and bilateral vertebral antegrade flow.  She was lost to  follow-up for many years and was then seen again in clinic in February 2023.  She has managed to lose substantial weight (initially started with Ozempic "jumpstarting" the process but now she has stopped that medication due to complaints of headaches, facial paresthesias and shakiness, continuing to lose weight slowly).  She is going to the [place] location where she is seen a trainer to help with weight loss.  Compliance with CPAP has been sketchy at best.  She has not had any recent gout attacks.  Her BMI is no longer in morbid obesity range and is down to about 38, but she still requires insulin therapy.  She had a gout attack about a year ago.  She is taking allopurinol and has a supply of colchicine as needed.  In the morning  Sue Davidson has type 2 diabetes mellitus that requires treatment with insulin (Dr. Talmage Nap), treated hyperlipidemia, treated hypertension, treated hypothyroidism. She was diagnosed many years ago with obstructive sleep apnea (sleep study performed in 2007). She underwent cardiac catheterization most recently in October 2012 which showed no evidence of coronary obstructive disease, although there was evidence of plaque and calcification in the proximal LAD artery as well as in the proximal circumflex coronary artery. Left ventricular end-diastolic pressure was 10 mmHg. 2 years earlier in 2010 she had right and left heart catheterization that showed a pulmonary artery wedge pressure of 6 and a PA pressure of 22/9. Prior to that she had normal coronary angiograms performed in 1997 and in 2003.  Echocardiography performed in June 2018 shows: - Left ventricle: The cavity size was normal. Wall thickness was   increased in a pattern of mild LVH. Systolic function was normal.   The estimated ejection fraction was in the range of 55% to 60%.   Wall motion was normal; there were no regional wall motion   abnormalities. Doppler parameters are consistent with abnormal   left ventricular  relaxation (grade 1 diastolic dysfunction). - Mitral valve: There was mild regurgitation. - Left atrium: The atrium was mildly dilated.   LV e&', lateral                          10.9  cm/s       LV E/e&', lateral                       5.78              LV e&', medial                          5.44  cm/s       LV E/e&', medial                      11.58            LV e&', average                       8.17  cm/s       LV E/e&', average                    7.71    Mitral E/A ratio, peak      0.5        Past Medical History:  Diagnosis Date   Anemia    hx of   Anginal pain (HCC)    hx of   Anxiety    Depression    Diabetes mellitus without complication (HCC)    GERD (gastroesophageal reflux disease)    Headache(784.0)    Heart murmur    Hyperlipidemia    Hypertension    MRSA (methicillin resistant staph aureus) culture positive    many years ago-abdominal wound- no issuses since.   PONV (postoperative nausea and vomiting)    severe   Sleep apnea    Study done -remains under evaluation- no cpap yet.   Vitamin D deficiency     Past Surgical History:  Procedure Laterality Date   ABDOMINAL HYSTERECTOMY  1970's   APPENDECTOMY  1970's   BREAST BIOPSY Left 2018   CARDIAC CATHETERIZATION     CHOLECYSTECTOMY N/A 10/02/2013   Procedure: LAPAROSCOPIC CHOLECYSTECTOMY;  Surgeon: Velora Heckler, MD;  Location: [facility];  Service: General;  Laterality: N/A;   cyst removed Left    wrist"ganglion"   ELBOW SURGERY Left    HARDWARE REMOVAL Left    ankle   KNEE ARTHROSCOPY Right 06/30/2016   Procedure: ARTHROSCOPY RIGHT KNEE WITH MEDIAL AND LATERAL MENSICAL DEBRIDEMENT;  Surgeon: Ollen Gross, MD;  Location: [facility];  Service: Orthopedics;  Laterality: Right;  LMA   LEG SURGERY Left 1980's   broke leg and ankle   SHOULDER ARTHROSCOPY Bilateral    TONSILLECTOMY  1970's   TUBAL LIGATION  1970's    Outpatient Medications Prior to Visit  Medication Sig Dispense Refill   acetaminophen  (TYLENOL) 500 MG  tablet Take 500 mg by mouth every 6 (six) hours as needed for moderate pain.     aspirin 81 MG tablet Take 81 mg by mouth daily.     buPROPion (WELLBUTRIN XL) 150 MG 24 hr tablet Take 1 tablet by mouth every morning.     cholecalciferol (VITAMIN D) 1000 units tablet Take 1,000 Units by mouth daily.     Continuous Glucose Sensor (FREESTYLE LIBRE 14 DAY SENSOR) MISC USE 1 SENSOR EVERY TWO WEEKS for 84     escitalopram (LEXAPRO) 20 MG tablet Take 20 mg by mouth daily.     gabapentin (NEURONTIN) 300 MG capsule Take 1 capsule (300 mg total) by mouth 2 (two) times daily. 270 capsule 1   glucose blood (ONETOUCH ULTRA TEST) test strip 1 each as needed.     HYDROcodone-acetaminophen (NORCO/VICODIN) 5-325 MG tablet Take 1 tablet by mouth every 6 (six) hours as needed.     insulin NPH-regular Human (NOVOLIN 70/30) (70-30) 100 UNIT/ML injection Inject into the skin 2 (two) times daily with a meal.     Insulin Pen Needle (BD PEN NEEDLE NANO 2ND GEN) 32G X 4 MM MISC as directed subcutaneous Once a day for 30 days     olmesartan (BENICAR) 40 MG tablet TAKE 1 TABLET BY MOUTH EVERY DAY 90 tablet 3   omeprazole (PRILOSEC) 40 MG capsule Take 40 mg by mouth daily. Reported on 01/19/2016     ondansetron (ZOFRAN) 8 MG tablet Take 8 mg by mouth 2 (two) times daily as needed for nausea or vomiting.     rosuvastatin (CRESTOR) 20 MG tablet Take 20 mg by mouth daily with breakfast.      sertraline (ZOLOFT) 100 MG tablet Take 100 mg by mouth daily.     diazepam (VALIUM) 5 MG tablet Take one tablet by mouth with food one hour prior to procedure. May repeat 30 minutes prior if needed. 2 tablet 0   furosemide (LASIX) 80 MG tablet TAKE 1 TABLET BY MOUTH EVERY DAY (Patient not taking: Reported on 07/20/2023) 90 tablet 3   levothyroxine (SYNTHROID, LEVOTHROID) 75 MCG tablet Take 75 mcg by mouth daily before breakfast.     ondansetron (ZOFRAN-ODT) 4 MG disintegrating tablet Take 4 mg by mouth as needed.     No  facility-administered medications prior to visit.     Allergies:   Tape   Social History   Socioeconomic History   Marital status: Widowed    Spouse name: Not on file   Number of children: Not on file   Years of education: Not on file   Highest education level: Not on file  Occupational History   Not on file  Tobacco Use   Smoking status: Never   Smokeless tobacco: Never  Vaping Use   Vaping status: Never Used  Substance and Sexual Activity   Alcohol use: No    Alcohol/week: 0.0 standard drinks of alcohol   Drug use: No   Sexual activity: Yes    Birth control/protection: Spermicide  Other Topics Concern   Not on file  Social History Narrative   Epworth Sleepiness Scale Score:  15      --I have HTN   --I have had Insomnia   --I feel stressed and lack motivation   --I have Diabetes   --I am overweight or am gaining weight   --I awake feeling not rested   Social Determinants of Health   Financial Resource Strain: Not on file  Food Insecurity: Not on file  Transportation Needs: Not on file  Physical Activity: Not on file  Stress: Not on file  Social Connections: Unknown (03/23/2022)   Received [facility]   Social Network    Social Network: Not on file     Family History:  The patient's family history includes Breast cancer in her paternal aunt; Cancer in her father, paternal aunt, and paternal aunt; Diabetes in her mother; Heart disease in her father and mother.   ROS:   Please see the history of present illness.    ROS All other systems reviewed and are negative   PHYSICAL EXAM:   VS:  BP (!) 178/80 (BP Location: Left Arm, Patient Position: Sitting, Cuff Size: Large)   Pulse 81   Ht 5\' 6"  (1.676 m)   Wt 237 lb 3.2 oz (107.6 kg)   SpO2 97%   BMI 38.29 kg/m     Recheck blood pressure 150/80 in the left upper extremity, 120/70 in the right upper extremity.  General: Alert, oriented x3, no distress, Severely obese Head: no evidence of trauma,  PERRL, EOMI, no exophtalmos or lid lag, no myxedema, no xanthelasma; normal ears, nose and oropharynx Neck: normal jugular venous pulsations and no hepatojugular reflux; brisk carotid pulses without delay and right carotid bruit Chest: clear to auscultation, no signs of consolidation by percussion or palpation, normal fremitus, symmetrical and full respiratory excursions Cardiovascular: normal position and quality of the apical impulse, regular rhythm, normal first and second heart sounds, 2/6 early peaking aortic ejection murmur, no diastolic murmurs, rubs or gallops Abdomen: no tenderness or distention, no masses by palpation, no abnormal pulsatility or arterial bruits, normal bowel sounds, no hepatosplenomegaly Extremities: no clubbing, cyanosis, symmetrical 1+ ankle edema; the radial pulse may be slightly weaker on the right 1+, rather than 2+ on the left.  There is a right subclavian bruit Neurological: grossly nonfocal Psych: Normal mood and affect    Wt Readings from Last 3 Encounters:  07/20/23 237 lb 3.2 oz (107.6 kg)  12/13/22 239 lb (108.4 kg)  01/01/22 245 lb (111.1 kg)      Studies/Labs Reviewed:   EKG:  EKG is ordered today.  It shows NSR, LAFB (old). Unchanged  Recent Labs: 12/13/2022: BUN 17; Creatinine, Ser 0.84; Hemoglobin 13.1; Platelets 219; Potassium 3.7; Sodium 138   Lipid PanelBefore treatment    Component Value Date/Time   CHOL 225 (H) 07/15/2011 0653   TRIG 186 (H) 07/15/2011 0653   HDL 30 (L) 07/15/2011 0653   CHOLHDL 7.5 07/15/2011 0653   VLDL 37 07/15/2011 0653   LDLCALC 158 (H) 07/15/2011 0653   12/13/2022 Hemoglobin 13.1, creatinine 0.4, potassium 3.7  04/28/2023 Cholesterol 170, HDL 51, LDL 92, triglycerides 153 Hemoglobin A1c 10.0%  ASSESSMENT:    1. Syncope, unspecified syncope type   2. Aortic valve stenosis, nonrheumatic   3. Bilateral carotid bruits   4. PAH (pulmonary artery hypertension) (HCC)   5. Chronic diastolic heart failure  (HCC)   6. OSA (obstructive sleep apnea)   7. Severe obesity (BMI 35.0-39.9) with comorbidity (HCC)   8. Essential hypertension   9. Type 2 diabetes mellitus without complication, with long-term current use of insulin (HCC)   10. Hypercholesterolemia      PLAN:  In order of problems listed above:  Syncope: This was completely unheralded and did not occur in the context of exercise, raising the concern for arrhythmia.  Will have her wear a minimum of an event monitor.  It has not recurred in 5  weeks that have passed since the initial event.  Reevaluate echocardiogram for interval development of more severe structural changes.   AS: There is a murmur of aortic valve stenosis, but there are no other signs to suggest severe aortic stenosis the echo in 2018 showed mild aortic stenosis with a mean gradient of 13 mmHg and a dimensionless valve index of 0.6.  It would be surprising for severe AS she develop in 6 years.  Recheck echocardiogram Possible right subclavian stenosis: Based on difference in blood pressure readings.  Scheduled for duplex ultrasound of the subclavian and carotid arteries.  She appears to have both a right carotid bruit and right subclavian bruit.  We discussed the potential contribution of atherosclerotic vascular disease to syncope in particular the possibility of vertebral steal syndrome if we find that she has subclavian stenosis. Pulmonary artery hypertension: Based on previous assessment, will showed normal left heart filling pressures and only very mild signs of diastolic dysfunction, this is most likely related to obesity and insufficiently treated obstructive sleep apnea.  Reevaluate by echo.  Urged compliance with CPAP.  Physical exam does not suggest severe pulmonary hypertension, syncope occurred at rest, unlikely to be related to [facility]. CHF: She has mild lower extremity edema, mostly compatible with peripheral venous insufficiency, obesity, may be a component of right heart  failure. OSA: Strongly recommend reestablishing follow-up in the sleep clinic and 100% compliance with CPAP. HTN: Blood pressure appears to be mildly elevated in the left upper extremity at 150/80.  No changes were made to medications today.  She will consistently check her blood pressure in the left upper extremity and run along to her next appointment. Obesity: Continue efforts at weight loss with judicious diet and exercise. DM: Hemoglobin A1c in June was 10.0%.  She had side effects with GLP-1 agonist, although it did help with weight loss.  Is not included on her current wrist, but I think she is taking Jardiance 25 mg once daily. HLP: If significant PAD is identified, target LDL should be less than 70 and I will recommend adding ezetimibe to the current prescription for rosuvastatin..  Medication Adjustments/Labs and Tests Ordered: Current medicines are reviewed at length with the patient today.  Concerns regarding medicines are outlined above.  Medication changes, Labs and Tests ordered today are listed in the Patient Instructions below. Patient Instructions  Medication Instructions:  No changes *If you need a refill on your cardiac medications before your next appointment, please call your pharmacy*  Testing/Procedures: Your physician has requested that you have an echocardiogram. Echocardiography is a painless test that uses sound waves to create images of your heart. It provides your doctor with information about the size and shape of your heart and how well your heart's chambers and valves are working. This procedure takes approximately one hour. There are no restrictions for this procedure. Please do NOT wear cologne, perfume, aftershave, or lotions (deodorant is allowed). Please arrive 15 minutes prior to your appointment time.   Your physician has requested that you have a carotid duplex. This test is an ultrasound of the carotid arteries in your neck. It looks at blood flow through  these arteries that supply the brain with blood. Allow one hour for this exam. There are no restrictions or special instructions.   Your physician has recommended that you wear a 14 DAY ZIO-PATCH monitor. The Zio patch cardiac monitor continuously records heart rhythm data for up to 14 days, this is for patients being evaluated for multiple types  heart rhythms. For the first 24 hours post application, please avoid getting the Zio monitor wet in the shower or by excessive sweating during exercise. After that, feel free to carry on with regular activities. Keep soaps and lotions away from the ZIO XT Patch.  This will be mailed to you, please expect 7-10 days to receive.    Applying the monitor   Shave hair from upper left chest.   Hold abrader disc by orange tab.  Rub abrader in 40 strokes over left upper chest as indicated in your monitor instructions.   Clean area with 4 enclosed alcohol pads .  Use all pads to assure are is cleaned thoroughly.  Let dry.   Apply patch as indicated in monitor instructions.  Patch will be place under collarbone on left side of chest with arrow pointing upward.   Rub patch adhesive wings for 2 minutes.Remove white label marked "1".  Remove white label marked "2".  Rub patch adhesive wings for 2 additional minutes.   While looking in a mirror, press and release button in center of patch.  A small green light will flash 3-4 times .  This will be your only indicator the monitor has been turned on.     Do not shower for the first 24 hours.  You may shower after the first 24 hours.   Press button if you feel a symptom. You will hear a small click.  Record Date, Time and Symptom in the Patient Log Book.   When you are ready to remove patch, follow instructions on last 2 pages of Patient Log Book.  Stick patch monitor onto last page of Patient Log Book.   Place Patient Log Book in [place] box.  Use locking tab on box and tape box closed securely.  The [employer] has [employer] on it.  Please place in mailbox as soon as possible.  Your physician should have your test results approximately 7 days after the monitor has been mailed back to [facility].   Call [facility] Customer Care at [phone] if you have questions regarding your ZIO XT patch monitor.  Call them immediately if you see an orange light blinking on your monitor.   If your monitor falls off in less than 4 days contact our Monitor department at [phone].  If your monitor becomes loose or falls off after 4 days call [employer] at [phone] for suggestions on securing your monitor    Follow-Up: At [facility], you and your health needs are our priority.  As part of our continuing mission to provide you with exceptional heart care, we have created designated Provider Care Teams.  These Care Teams include your primary Cardiologist (physician) and Advanced Practice Providers (APPs -  Physician Assistants and Nurse Practitioners) who all work together to provide you with the care you need, when you need it.  We recommend signing up for the patient portal called "MyChart".  Sign up information is provided on this After Visit Summary.  MyChart is used to connect with patients for Virtual Visits (Telemedicine).  Patients are able to view lab/test results, encounter notes, upcoming appointments, etc.  Non-urgent messages can be sent to your provider as well.   To learn more about what you can do with MyChart, go to [email or web address].    Your next appointment:   4-5 month(s)  Provider:   Thurmon Fair, MD         Signed, Thurmon Fair, MD  07/22/2023 6:57  PM    [facility] [address] [place], [place], [place]  [address] Phone: [phone]; Fax: [phone]

## 2023-07-22 ENCOUNTER — Encounter: Payer: Self-pay | Admitting: Cardiovascular Disease

## 2023-07-23 DIAGNOSIS — R55 Syncope and collapse: Secondary | ICD-10-CM | POA: Diagnosis not present

## 2023-07-24 DIAGNOSIS — R55 Syncope and collapse: Secondary | ICD-10-CM | POA: Diagnosis not present

## 2023-07-27 ENCOUNTER — Encounter: Payer: Self-pay | Admitting: Cardiovascular Disease

## 2023-07-27 DIAGNOSIS — I10 Essential (primary) hypertension: Secondary | ICD-10-CM

## 2023-07-28 MED ORDER — AMLODIPINE BESYLATE 2.5 MG PO TABS: 2.5000 mg | ORAL_TABLET | Freq: Every day | ORAL | 3 refills | Status: DC

## 2023-07-28 NOTE — Telephone Encounter (Signed)
Most of the readings are indeed a little high (but most folk would not get a headache from that). Please add amlodipine 2.5 mg daily (current dose of olmesartan is the maximum). Note that untreated sleep apnea can cause both headaches and elevated BP.

## 2023-08-02 ENCOUNTER — Ambulatory Visit
Admission: RE | Admit: 2023-08-02 | Discharge: 2023-08-02 | Disposition: A | Discharge status: Home / Self Care | Payer: Medicare Other | Source: Ambulatory Visit | Attending: Registered Nurse | Admitting: Registered Nurse

## 2023-08-02 DIAGNOSIS — S0990XA Unspecified injury of head, initial encounter: Secondary | ICD-10-CM

## 2023-08-02 DIAGNOSIS — I672 Cerebral atherosclerosis: Secondary | ICD-10-CM | POA: Diagnosis not present

## 2023-08-08 ENCOUNTER — Telehealth: Payer: Self-pay | Admitting: *Deleted

## 2023-08-08 NOTE — Telephone Encounter (Signed)
Hello,   This email is to notify you that the patient listed below is returning their Zio AT max transmission replacement  device back unused.   Account: [facility] [address] Patient Initials: R.H. Patient ID: [id number] SN: [id number] Ticket: [id number]   If you have any questions, please respond to this email, or call our customer care team at [phone].   For more information about other unresolved or pending items, please log in to [facility] or refer to your Zio Action Report, which is sent every Monday and Thursday via email.     Thank you, [facility]

## 2023-08-10 ENCOUNTER — Ambulatory Visit (HOSPITAL_BASED_OUTPATIENT_CLINIC_OR_DEPARTMENT_OTHER): Payer: Medicare Other

## 2023-08-10 DIAGNOSIS — I350 Nonrheumatic aortic (valve) stenosis: Secondary | ICD-10-CM

## 2023-08-10 DIAGNOSIS — R0989 Other specified symptoms and signs involving the circulatory and respiratory systems: Secondary | ICD-10-CM

## 2023-08-10 LAB — ECHOCARDIOGRAM COMPLETE
AR max vel: 1.35 cm2
AV Area VTI: 1.3 cm2
AV Area mean vel: 1.22 cm2
AV Mean grad: 5 mm[Hg]
AV Peak grad: 9.9 mm[Hg]
Ao pk vel: 1.57 m/s
Area-P 1/2: 2.91 cm2
S' Lateral: 2.67 cm

## 2023-08-10 MED ORDER — PERFLUTREN LIPID MICROSPHERE
1.0000 mL | INTRAVENOUS | Status: AC | PRN
  Administered 2023-08-10: 3 mL via INTRAVENOUS

## 2023-08-12 ENCOUNTER — Encounter: Payer: Self-pay | Admitting: Cardiovascular Disease

## 2023-10-14 ENCOUNTER — Other Ambulatory Visit: Payer: Self-pay | Admitting: Registered Nurse

## 2023-10-14 DIAGNOSIS — Z1231 Encounter for screening mammogram for malignant neoplasm of breast: Secondary | ICD-10-CM

## 2023-10-19 DIAGNOSIS — E1165 Type 2 diabetes mellitus with hyperglycemia: Secondary | ICD-10-CM | POA: Diagnosis not present

## 2023-10-19 DIAGNOSIS — E559 Vitamin D deficiency, unspecified: Secondary | ICD-10-CM | POA: Diagnosis not present

## 2023-10-26 DIAGNOSIS — E039 Hypothyroidism, unspecified: Secondary | ICD-10-CM | POA: Diagnosis not present

## 2023-10-26 DIAGNOSIS — E7800 Pure hypercholesterolemia, unspecified: Secondary | ICD-10-CM | POA: Diagnosis not present

## 2023-10-26 DIAGNOSIS — E559 Vitamin D deficiency, unspecified: Secondary | ICD-10-CM | POA: Diagnosis not present

## 2023-10-26 DIAGNOSIS — E1165 Type 2 diabetes mellitus with hyperglycemia: Secondary | ICD-10-CM | POA: Diagnosis not present

## 2023-11-10 ENCOUNTER — Ambulatory Visit: Payer: Medicare Other

## 2023-11-25 ENCOUNTER — Encounter: Payer: Self-pay | Admitting: Cardiovascular Disease

## 2023-11-25 ENCOUNTER — Ambulatory Visit: Payer: Medicare Other | Attending: Cardiovascular Disease | Admitting: Cardiovascular Disease

## 2023-11-25 VITALS — BP 134/62 | HR 92 | Ht 66.0 in | Wt 236.2 lb

## 2023-11-25 DIAGNOSIS — G4733 Obstructive sleep apnea (adult) (pediatric): Secondary | ICD-10-CM

## 2023-11-25 DIAGNOSIS — R55 Syncope and collapse: Secondary | ICD-10-CM | POA: Diagnosis not present

## 2023-11-25 DIAGNOSIS — I2721 Secondary pulmonary arterial hypertension: Secondary | ICD-10-CM | POA: Diagnosis not present

## 2023-11-25 DIAGNOSIS — I5032 Chronic diastolic (congestive) heart failure: Secondary | ICD-10-CM

## 2023-11-25 DIAGNOSIS — Z794 Long term (current) use of insulin: Secondary | ICD-10-CM

## 2023-11-25 DIAGNOSIS — E119 Type 2 diabetes mellitus without complications: Secondary | ICD-10-CM

## 2023-11-25 DIAGNOSIS — I10 Essential (primary) hypertension: Secondary | ICD-10-CM

## 2023-11-25 DIAGNOSIS — I350 Nonrheumatic aortic (valve) stenosis: Secondary | ICD-10-CM

## 2023-11-25 DIAGNOSIS — E785 Hyperlipidemia, unspecified: Secondary | ICD-10-CM

## 2023-11-25 NOTE — Progress Notes (Signed)
Patient ID: Sue Davidson, female   DOB: [birth year], 73 y.o.   MRN: [id number]    Cardiology Office Note    Date:  11/25/2023   ID:  Sue Davidson, Sue Davidson, Sue Davidson, MRN [id number]  PCP:  Sue Brunette, MD  Cardiologist:   Sue Fair, MD   Chief Complaint  Patient presents with   Loss of Consciousness    History of Present Illness:  Sue Davidson is a 73 y.o. female with severe obesity and obstructive sleep apnea, complaints of bilateral lower extremity edema, nonobstructive CAD per cath in 2012, very mild aortic stenosis by echo in 2018 (only aortic sclerosis by echo 2024), hypertension, hyperlipidemia, normal LV systolic function, minimal signs of diastolic dysfunction, function and mild pulmonary hypertension by echo (38 mmHg in 2017, but normal at 29 mmHg in 2024).    She had an episode of unexplained syncope in 2024 and had a normal arrhythmia monitor after that (only rare and brief episodes of ectopic atrial tachycardia).  Her echo did not show aortic valve stenosis or pulmonary hypertension.  She had normal regional wall motion and there were only mild abnormalities in diastolic function.  Carotid duplex ultrasound showed only mild bilateral plaque and normal flow in both subclavian arteries with antegrade vertebral artery flow bilaterally.  She has not had any new syncopal events.  She complains of lack of energy but has no other cardiovascular complaints.  She specifically denies chest pain and palpitations.  She has had some discomfort occasionally have right upper quadrant but has previously had cholecystectomy.  She has not had problems with edema, orthopnea or PND.  She complains of frequent headaches.  She wears her CPAP infrequently.  She states that she does follow with it for first 4 to 5 days, but then it starts leaking and she stops wearing it.  Glycemic control is a little better but remains well outside target range with a hemoglobin A1c of 9.1% last month.  She also  had mildly elevated LDL cholesterol 117.  She is open start treatment with Mounjaro.  She did not tolerate Ozempic due to GI side effects.  She had to stop Jardiance due to genital yeast infection.  Sue Davidson has type 2 diabetes mellitus that requires treatment with insulin (Dr. Talmage Davidson), treated hyperlipidemia, treated hypertension, treated hypothyroidism. She was diagnosed many years ago with obstructive sleep apnea (sleep study performed in 2007). She underwent cardiac catheterization most recently in October 2012 which showed no evidence of coronary obstructive disease, although there was evidence of plaque and calcification in the proximal LAD artery as well as in the proximal circumflex coronary artery. Left ventricular end-diastolic pressure was 10 mmHg. 2 years earlier in 2010 she had right and left heart catheterization that showed a pulmonary artery wedge pressure of 6 and a PA pressure of 22/9. Prior to that she had normal coronary angiograms performed in 1997 and in 2003.   Past Medical History:  Diagnosis Date   Anemia    hx of   Anginal pain (HCC)    hx of   Anxiety    Depression    Diabetes mellitus without complication (HCC)    GERD (gastroesophageal reflux disease)    Headache(784.0)    Heart murmur    Hyperlipidemia    Hypertension    MRSA (methicillin resistant staph aureus) culture positive    many years ago-abdominal wound- no issuses since.   PONV (postoperative nausea and vomiting)    severe   Sleep apnea  Study done -remains under evaluation- no cpap yet.   Vitamin D deficiency     Past Surgical History:  Procedure Laterality Date   ABDOMINAL HYSTERECTOMY  1970's   APPENDECTOMY  1970's   BREAST BIOPSY Left 2018   CARDIAC CATHETERIZATION     CHOLECYSTECTOMY N/A 10/02/2013   Procedure: LAPAROSCOPIC CHOLECYSTECTOMY;  Surgeon: Velora Heckler, MD;  Location: [facility];  Service: General;  Laterality: N/A;   cyst removed Left    wrist"ganglion"   ELBOW SURGERY  Left    HARDWARE REMOVAL Left    ankle   KNEE ARTHROSCOPY Right 06/30/2016   Procedure: ARTHROSCOPY RIGHT KNEE WITH MEDIAL AND LATERAL MENSICAL DEBRIDEMENT;  Surgeon: Ollen Gross, MD;  Location: [facility];  Service: Orthopedics;  Laterality: Right;  LMA   LEG SURGERY Left 1980's   broke leg and ankle   SHOULDER ARTHROSCOPY Bilateral    TONSILLECTOMY  1970's   TUBAL LIGATION  1970's    Outpatient Medications Prior to Visit  Medication Sig Dispense Refill   acetaminophen (TYLENOL) 500 MG tablet Take 500 mg by mouth every 6 (six) hours as needed for moderate pain.     amLODipine (NORVASC) 2.5 MG tablet Take 1 tablet (2.5 mg total) by mouth daily. 90 tablet 3   aspirin 81 MG tablet Take 81 mg by mouth daily.     buPROPion (WELLBUTRIN XL) 150 MG 24 hr tablet Take 1 tablet by mouth every morning.     cholecalciferol (VITAMIN D) 1000 units tablet Take 1,000 Units by mouth daily.     Continuous Glucose Sensor (FREESTYLE LIBRE 14 DAY SENSOR) MISC USE 1 SENSOR EVERY TWO WEEKS for 84     furosemide (LASIX) 80 MG tablet TAKE 1 TABLET BY MOUTH EVERY DAY 90 tablet 3   glucose blood (ONETOUCH ULTRA TEST) test strip 1 each as needed.     HYDROcodone-acetaminophen (NORCO/VICODIN) 5-325 MG tablet Take 1 tablet by mouth every 6 (six) hours as needed.     insulin NPH-regular Human (NOVOLIN 70/30) (70-30) 100 UNIT/ML injection Inject into the skin 2 (two) times daily with a meal.     Insulin Pen Needle (BD PEN NEEDLE NANO 2ND GEN) 32G X 4 MM MISC as directed subcutaneous Once a day for 30 days     olmesartan (BENICAR) 40 MG tablet TAKE 1 TABLET BY MOUTH EVERY DAY 90 tablet 3   omeprazole (PRILOSEC) 40 MG capsule Take 40 mg by mouth daily. Reported on 01/19/2016     ondansetron (ZOFRAN) 8 MG tablet Take 8 mg by mouth 2 (two) times daily as needed for nausea or vomiting.     rosuvastatin (CRESTOR) 20 MG tablet Take 20 mg by mouth daily with breakfast.      sertraline (ZOLOFT) 100 MG tablet Take 100 mg by mouth  daily.     Cholecalciferol 125 MCG (5000 UT) capsule Take 5,000 Units by mouth daily.     escitalopram (LEXAPRO) 20 MG tablet Take 20 mg by mouth daily.     gabapentin (NEURONTIN) 300 MG capsule Take 1 capsule (300 mg total) by mouth 2 (two) times daily. 270 capsule 1   levothyroxine (SYNTHROID, LEVOTHROID) 75 MCG tablet Take 75 mcg by mouth daily before breakfast.     No facility-administered medications prior to visit.     Allergies:   Pioglitazone, Semaglutide, and Tape   Social History   Socioeconomic History   Marital status: Widowed    Spouse name: Not on file   Number of children: Not  on file   Years of education: Not on file   Highest education level: Not on file  Occupational History   Not on file  Tobacco Use   Smoking status: Never   Smokeless tobacco: Never  Vaping Use   Vaping status: Never Used  Substance and Sexual Activity   Alcohol use: No    Alcohol/week: 0.0 standard drinks of alcohol   Drug use: No   Sexual activity: Yes    Birth control/protection: Spermicide  Other Topics Concern   Not on file  Social History Narrative   Epworth Sleepiness Scale Score:  15      --I have HTN   --I have had Insomnia   --I feel stressed and lack motivation   --I have Diabetes   --I am overweight or am gaining weight   --I awake feeling not rested   Social Drivers of Corporate investment banker Strain: Not on file  Food Insecurity: Not on file  Transportation Needs: Not on file  Physical Activity: Not on file  Stress: Not on file  Social Connections: Unknown (03/23/2022)   [facility] from [facility]   Social Network    Social Network: Not on file     Family History:  The patient's family history includes Breast cancer in her paternal aunt; Cancer in her father, paternal aunt, and paternal aunt; Diabetes in her mother; Heart disease in her father and mother.   ROS:   Please see the history of present illness.    ROS All other systems  reviewed and are negative   PHYSICAL EXAM:   VS:  BP 134/62   Pulse 92   Ht 5\' 6"  (1.676 m)   Wt 236 lb 3.2 oz (107.1 kg)   SpO2 91%   BMI 38.12 kg/m      General: Alert, oriented x3, no distress, severely obese Head: no evidence of trauma, PERRL, EOMI, no exophtalmos or lid lag, no myxedema, no xanthelasma; normal ears, nose and oropharynx Neck: normal jugular venous pulsations and no hepatojugular reflux; brisk carotid pulses without delay and no carotid bruits Chest: clear to auscultation, no signs of consolidation by percussion or palpation, normal fremitus, symmetrical and full respiratory excursions Cardiovascular: normal position and quality of the apical impulse, regular rhythm, normal first and second heart sounds, very faint aortic ejection murmur, no diastolic murmurs, rubs or gallops Abdomen: no tenderness or distention, no masses by palpation, no abnormal pulsatility or arterial bruits, normal bowel sounds, no hepatosplenomegaly Extremities: Mild swelling in the left ankle where she has scars of previous surgery Neurological: grossly nonfocal Psych: Normal mood and affect     Wt Readings from Last 3 Encounters:  11/25/23 236 lb 3.2 oz (107.1 kg)  07/20/23 237 lb 3.2 oz (107.6 kg)  12/13/22 239 lb (108.4 kg)      Studies/Labs Reviewed:   EKG: Personally reviewed the tracing from 07/20/2023.  It shows NSR, LAFB (old). Unchanged  Recent Labs: 12/13/2022: BUN 17; Creatinine, Ser 0.84; Hemoglobin 13.1; Platelets 219; Potassium 3.7; Sodium 138   Lipid PanelBefore treatment    Component Value Date/Time   CHOL 225 (H) 07/15/2011 0653   TRIG 186 (H) 07/15/2011 0653   HDL 30 (L) 07/15/2011 0653   CHOLHDL 7.5 07/15/2011 0653   VLDL 37 07/15/2011 0653   LDLCALC 158 (H) 07/15/2011 0653   12/13/2022 Hemoglobin 13.1, creatinine 0.4, potassium 3.7  04/28/2023 Cholesterol 170, HDL 51, LDL 92, triglycerides 153 Hemoglobin A1c 10.0%  10/19/2023 Cholesterol 178,  HDL  42, LDL 117, triglycerides 103 Hemoglobin A1c 9.1%  ASSESSMENT:    1. Syncope, unspecified syncope type   2. Aortic valve stenosis, nonrheumatic   3. PAH (pulmonary artery hypertension) (HCC)   4. Chronic diastolic heart failure (HCC)   5. OSA (obstructive sleep apnea)   6. Essential hypertension   7. Severe obesity (BMI 35.0-39.9) with comorbidity (HCC)   8. Type 2 diabetes mellitus without complication, with long-term current use of insulin (HCC)   9. Dyslipidemia (high LDL; low HDL)       PLAN:  In order of problems listed above:  Syncope: Single event, remains unexplained.  No meaningful arrhythmia on event monitor and no structural abnormality on echo or carotid ultrasound that could explain it.  If this recurs I would recommend going directly to an implantable loop recorder. AS: She has at most very mild aortic stenosis.  This could not explain syncope. Possible right subclavian stenosis: Blood pressure today was equal in the right and left upper extremities.  Not sure why we obtained different values last year.  Carotid duplex ultrasound did not show significant carotid stenosis and there was no evidence of subclavian stenosis.  There was bilateral antegrade vertebral flow.   Pulmonary artery hypertension: This was mildly elevated in the past and borderline on the most recent echo.  Likely explained by obesity and insufficiently treated sleep apnea.. CHF: Currently without any lower extremity edema.  He has only mild diastolic dysfunction.  She is at risk for development of right heart failure due to untreated sleep apnea. OSA: She would like a new opinion regarding her sleep equipment.  Will refer her to Dr. Mayford Knife.  Strongly recommend further attempts at weight loss.  Suspect that her lack of energy and her headaches are probably related to untreated sleep apnea. HTN: Normal blood pressure today. Obesity: Hopefully she will be able to start treatment with [facility] and will  tolerate it. DM: Some improvement in glycemic control but still above target.  Insists she has to have at least 1 regular Pepsi daily.  Unfortunately did not tolerate SGLT2 inhibitors or Ozempic.  Plans to try [facility].  Recommend avoiding Actos. HLP: LDL above target, but her HDL has improved.  Target LDL cholesterol less than 865.  Recommended taking her rosuvastatin consistently (untreated LDL is 170 and when taking her statin consistently, her LDL cholesterol has been as low as 76) I recommend that voice.  Medication Adjustments/Labs and Tests Ordered: Current medicines are reviewed at length with the patient today.  Concerns regarding medicines are outlined above.  Medication changes, Labs and Tests ordered today are listed in the Patient Instructions below. Patient Instructions  Medication Instructions:  No changes *If you need a refill on your cardiac medications before your next appointment, please call your pharmacy*  Follow-Up: At [facility], you and your health needs are our priority.  As part of our continuing mission to provide you with exceptional heart care, we have created designated Provider Care Teams.  These Care Teams include your primary Cardiologist (physician) and Advanced Practice Providers (APPs -  Physician Assistants and Nurse Practitioners) who all work together to provide you with the care you need, when you need it.  We recommend signing up for the patient portal called "MyChart".  Sign up information is provided on this After Visit Summary.  MyChart is used to connect with patients for Virtual Visits (Telemedicine).  Patients are able to view lab/test results, encounter notes, upcoming appointments, etc.  Non-urgent messages can be sent to your provider as well.   To learn more about what you can do with MyChart, go to [email or web address].    Your next appointment:   1 year(s)  Provider:   Thurmon Fair, MD               Signed, Sue Fair, MD  11/25/2023 8:38 AM    [facility] [address] [place], [place], [place]  [address] Phone: [phone]; Fax: [phone]

## 2023-11-25 NOTE — Patient Instructions (Signed)

## 2023-12-02 ENCOUNTER — Ambulatory Visit
Admission: RE | Admit: 2023-12-02 | Discharge: 2023-12-02 | Disposition: A | Discharge status: Home / Self Care | Payer: Medicare Other | Source: Ambulatory Visit | Attending: Registered Nurse | Admitting: Registered Nurse

## 2023-12-02 DIAGNOSIS — Z1231 Encounter for screening mammogram for malignant neoplasm of breast: Secondary | ICD-10-CM | POA: Diagnosis not present

## 2023-12-05 DIAGNOSIS — E7800 Pure hypercholesterolemia, unspecified: Secondary | ICD-10-CM | POA: Diagnosis not present

## 2023-12-05 DIAGNOSIS — E039 Hypothyroidism, unspecified: Secondary | ICD-10-CM | POA: Diagnosis not present

## 2023-12-05 DIAGNOSIS — E1165 Type 2 diabetes mellitus with hyperglycemia: Secondary | ICD-10-CM | POA: Diagnosis not present

## 2023-12-05 DIAGNOSIS — E559 Vitamin D deficiency, unspecified: Secondary | ICD-10-CM | POA: Diagnosis not present

## 2024-01-05 ENCOUNTER — Telehealth: Payer: Self-pay | Admitting: Radiology

## 2024-01-05 NOTE — Telephone Encounter (Signed)
 Patient called and lmom that she would like to schedule an appt for back issues. I tried to call back but the VM was full, we will try again tomorrow to schedule

## 2024-01-09 ENCOUNTER — Telehealth: Payer: Self-pay | Admitting: Physical Medicine and Rehabilitation

## 2024-01-09 ENCOUNTER — Telehealth: Payer: Self-pay

## 2024-01-09 NOTE — Telephone Encounter (Signed)
Patient called. Would like an appointment with [facility].

## 2024-01-09 NOTE — Telephone Encounter (Signed)
 Pt called requesting another injection. The last one, 04/25/23, helped her greater than 75%. In Aug 2024, she passed out due to unknown causes and back started bother her some then. In the last 4-6 wks she has had increased pain in lower back, buttocks and L leg, same as previous. Current pain score 8 1/2.

## 2024-01-10 ENCOUNTER — Other Ambulatory Visit: Payer: Self-pay | Admitting: Physical Medicine and Rehabilitation

## 2024-01-10 DIAGNOSIS — E1165 Type 2 diabetes mellitus with hyperglycemia: Secondary | ICD-10-CM | POA: Diagnosis not present

## 2024-01-10 DIAGNOSIS — Z79899 Other long term (current) drug therapy: Secondary | ICD-10-CM | POA: Diagnosis not present

## 2024-01-10 DIAGNOSIS — M5416 Radiculopathy, lumbar region: Secondary | ICD-10-CM

## 2024-01-23 DIAGNOSIS — E119 Type 2 diabetes mellitus without complications: Secondary | ICD-10-CM | POA: Diagnosis not present

## 2024-01-23 DIAGNOSIS — E109 Type 1 diabetes mellitus without complications: Secondary | ICD-10-CM | POA: Diagnosis not present

## 2024-01-23 DIAGNOSIS — M65331 Trigger finger, right middle finger: Secondary | ICD-10-CM | POA: Diagnosis not present

## 2024-01-23 DIAGNOSIS — G5601 Carpal tunnel syndrome, right upper limb: Secondary | ICD-10-CM | POA: Diagnosis not present

## 2024-01-25 ENCOUNTER — Other Ambulatory Visit: Payer: Self-pay

## 2024-01-25 ENCOUNTER — Ambulatory Visit: Admitting: Physical Medicine and Rehabilitation

## 2024-01-25 VITALS — BP 130/76 | HR 66

## 2024-01-25 DIAGNOSIS — M5416 Radiculopathy, lumbar region: Secondary | ICD-10-CM | POA: Diagnosis not present

## 2024-01-25 MED ORDER — METHYLPREDNISOLONE ACETATE 40 MG/ML IJ SUSP
40.0000 mg | Freq: Once | INTRAMUSCULAR | Status: AC
  Administered 2024-01-25: 40 mg

## 2024-01-25 NOTE — Progress Notes (Signed)
 Pain Scale   Average Pain 8        +Driver, -+BT, -Dye Allergies.

## 2024-01-25 NOTE — Patient Instructions (Signed)

## 2024-02-01 NOTE — Progress Notes (Signed)
 Sue Davidson - 73 y.o. female MRN [id number]  Date of birth: April 24, 1951  Office Visit Note: Visit Date: 01/25/2024 PCP: Merri Brunette, MD Referred by: Merri Brunette, MD  Subjective: Chief Complaint  Patient presents with   Lower Back - Pain   HPI:  Sue Davidson is a 73 y.o. female who comes in today at the request of Ellin Goodie, FNP for planned Left L3-4 Lumbar Interlaminar epidural steroid injection with fluoroscopic guidance.  The patient has failed conservative care including home exercise, medications, time and activity modification.  This injection will be diagnostic and hopefully therapeutic.  Please see requesting physician notes for further details and justification.    ROS Otherwise per HPI.  Assessment & Plan: Visit Diagnoses:    ICD-10-CM   1. Lumbar radiculopathy  M54.16 XR C-ARM NO REPORT    Epidural Steroid injection    methylPREDNISolone acetate (DEPO-MEDROL) injection 40 mg      Plan: No additional findings.   Meds & Orders:  Meds ordered this encounter  Medications   methylPREDNISolone acetate (DEPO-MEDROL) injection 40 mg    Orders Placed This Encounter  Procedures   XR C-ARM NO REPORT   Epidural Steroid injection    Follow-up: Return for visit to requesting provider as needed.   Procedures: No procedures performed  Lumbar Epidural Steroid Injection - Interlaminar Approach with Fluoroscopic Guidance  Patient: Sue Davidson      Date of Birth: Feb 03, 1951 MRN: [id number] PCP: Merri Brunette, MD      Visit Date: 01/25/2024   Universal Protocol:     Consent Given By: the patient  Position: PRONE  Additional Comments: Vital signs were monitored before and after the procedure. Patient was prepped and draped in the usual sterile fashion. The correct patient, procedure, and site was verified.   Injection Procedure Details:   Procedure diagnoses: Lumbar radiculopathy [M54.16]   Meds Administered:  Meds ordered this encounter   Medications   methylPREDNISolone acetate (DEPO-MEDROL) injection 40 mg     Laterality: Left  Location/Site:  L3-4  Needle: 3.5 in., 20 ga. Tuohy  Needle Placement: Paramedian epidural  Findings:   -Comments: Excellent flow of contrast into the epidural space.  Procedure Details: Using a paramedian approach from the side mentioned above, the region overlying the inferior lamina was localized under fluoroscopic visualization and the soft tissues overlying this structure were infiltrated with 4 ml. of 1% Lidocaine without Epinephrine. The Tuohy needle was inserted into the epidural space using a paramedian approach.   The epidural space was localized using loss of resistance along with counter oblique bi-planar fluoroscopic views.  After negative aspirate for air, blood, and CSF, a 2 ml. volume of Isovue-250 was injected into the epidural space and the flow of contrast was observed. Radiographs were obtained for documentation purposes.    The injectate was administered into the level noted above.   Additional Comments:  No complications occurred Dressing: 2 x 2 sterile gauze and Band-Aid    Post-procedure details: Patient was observed during the procedure. Post-procedure instructions were reviewed.  Patient left the clinic in stable condition.   Clinical History: MRI LUMBAR SPINE WITHOUT CONTRAST    Unless otherwise stated, the level by level findings below have not significantly changed from the prior MRI of 09/26/2007.   Multilevel disc degeneration, progressed from the prior MRI. Disc degeneration is greatest to the right at L1-L2 (moderate) and to the right at L2-L3 (moderate/advanced).   T11-T12: This level is imaged in the  sagittal plane only. Disc bulge, new from the prior MRI. Mild facet arthrosis. No significant spinal canal or foraminal stenosis.   T12-L1: No significant disc herniation or stenosis. Trace right facet joint effusion.   L1-L2: Disc bulge, new  from the prior MRI. Left foraminal disc protrusion, progressed from the prior MRI. Mild to moderate facet arthrosis and ligamentum flavum hypertrophy, new from the prior MRI. No significant spinal canal stenosis. Mild relative left inferior neural foraminal narrowing, unchanged. The left foraminal disc protrusion may contact the undersurface of the exiting left L1 nerve root within the neural foramen. Moderate right neural foraminal narrowing, new from the prior MRI the left foraminal.   L2-L3: Slight grade 1 retrolisthesis, new from the prior MRI. Progressive disc bulge. New superimposed broad-based left foraminal/extraforaminal disc protrusion. Endplate osteophytes, predominantly along the right aspect of the disc space and new from the prior MRI. Mild-to-moderate facet arthrosis and ligamentum flavum hypertrophy, new from the prior MRI. Mild effacement of the ventral thecal sac (without appreciable nerve root impingement). Mild relative left inferior neural foraminal narrowing, new from the prior MRI. The left foraminal/extraforaminal disc protrusion may contact the undersurface of the exiting left L2 nerve root within and beyond the left neural foramen.   L3-L4: Slight grade 1 anterolisthesis. Progressive disc bulge. Facet arthrosis (mild moderate right, moderate left) with ligamentum flavum hypertrophy, progressed. Trace right facet joint effusion, new from the prior MRI. Minimal relative left subarticular narrowing (without nerve root impingement), new from the prior MRI. Mild relative left neural foraminal narrowing, also new from the prior MRI.   L4-L5: Disc bulge. Progressive facet hypertrophy. Mild ligamentum flavum thickening on the left. No significant spinal canal or foraminal stenosis.   L5-S1: Mild facet arthrosis, progressed. No significant disc herniation or stenosis.   IMPRESSION: 1. Lumbar spondylosis, as outlined and having progressed at multiple levels since  the prior MRI of 09/26/2007. Findings are most notably as follows. 2. At L1-L2, there is progressive moderate disc degeneration. A left foraminal disc protrusion contributes to mild relative left inferior neural foraminal narrowing. The disc protrusion may contact the undersurface of the exiting left L1 nerve root within the neural foramen. Moderate right neural foraminal narrowing, new from the prior MRI. 3. At L2-L3, there is progressive moderate/advanced disc degeneration. A new broad-based left foraminal/extraforaminal disc protrusion contributes to mild relative left inferior neural foraminal narrowing. The disc protrusion may contact the undersurface of the exiting left L2 nerve root within and beyond the left neural foramen. 4. No more than mild spinal canal or neural foraminal narrowing at the remaining levels. 5. Multilevel facet arthrosis, greatest on the left at L3-L4 (moderate at this site). 6. Degenerative endplate edema at Z6-X0 (mild) and L2-L3 (moderate). 7. Levocurvature of the lumbar spine. 8. Slight grade 1 spondylolisthesis at L2-L3, L3-L4 and L4-L5.    Electronically Signed   By: Jackey Loge D.O.   On: 04/05/2023 08:50     Objective:  VS:  HT:    WT:   BMI:     BP:130/76  HR:66bpm  TEMP: ( )  RESP:  Physical Exam Vitals and nursing note reviewed.  Constitutional:      General: She is not in acute distress.    Appearance: Normal appearance. She is not ill-appearing.  HENT:     Head: Normocephalic and atraumatic.     Right Ear: External ear normal.     Left Ear: External ear normal.  Eyes:     Extraocular Movements: Extraocular movements  intact.  Cardiovascular:     Rate and Rhythm: Normal rate.     Pulses: Normal pulses.  Pulmonary:     Effort: Pulmonary effort is normal. No respiratory distress.  Abdominal:     General: There is no distension.     Palpations: Abdomen is soft.  Musculoskeletal:        General: Tenderness present.      Cervical back: Neck supple.     Right lower leg: No edema.     Left lower leg: No edema.     Comments: Patient has good distal strength with no pain over the greater trochanters.  No clonus or focal weakness.  Skin:    Findings: No erythema, lesion or rash.  Neurological:     General: No focal deficit present.     Mental Status: She is alert and oriented to person, place, and time.     Sensory: No sensory deficit.     Motor: No weakness or abnormal muscle tone.     Coordination: Coordination normal.  Psychiatric:        Mood and Affect: Mood normal.        Behavior: Behavior normal.      Imaging: No results found.

## 2024-02-01 NOTE — Procedures (Signed)
 Lumbar Epidural Steroid Injection - Interlaminar Approach with Fluoroscopic Guidance  Patient: Sue Davidson      Date of Birth: September 23, 1951 MRN: [id number] PCP: Merri Brunette, MD      Visit Date: 01/25/2024   Universal Protocol:     Consent Given By: the patient  Position: PRONE  Additional Comments: Vital signs were monitored before and after the procedure. Patient was prepped and draped in the usual sterile fashion. The correct patient, procedure, and site was verified.   Injection Procedure Details:   Procedure diagnoses: Lumbar radiculopathy [M54.16]   Meds Administered:  Meds ordered this encounter  Medications   methylPREDNISolone acetate (DEPO-MEDROL) injection 40 mg     Laterality: Left  Location/Site:  L3-4  Needle: 3.5 in., 20 ga. Tuohy  Needle Placement: Paramedian epidural  Findings:   -Comments: Excellent flow of contrast into the epidural space.  Procedure Details: Using a paramedian approach from the side mentioned above, the region overlying the inferior lamina was localized under fluoroscopic visualization and the soft tissues overlying this structure were infiltrated with 4 ml. of 1% Lidocaine without Epinephrine. The Tuohy needle was inserted into the epidural space using a paramedian approach.   The epidural space was localized using loss of resistance along with counter oblique bi-planar fluoroscopic views.  After negative aspirate for air, blood, and CSF, a 2 ml. volume of Isovue-250 was injected into the epidural space and the flow of contrast was observed. Radiographs were obtained for documentation purposes.    The injectate was administered into the level noted above.   Additional Comments:  No complications occurred Dressing: 2 x 2 sterile gauze and Band-Aid    Post-procedure details: Patient was observed during the procedure. Post-procedure instructions were reviewed.  Patient left the clinic in stable condition.

## 2024-02-20 ENCOUNTER — Ambulatory Visit: Admitting: Physical Medicine and Rehabilitation

## 2024-02-20 ENCOUNTER — Encounter: Payer: Self-pay | Admitting: Physical Medicine and Rehabilitation

## 2024-02-20 DIAGNOSIS — R269 Unspecified abnormalities of gait and mobility: Secondary | ICD-10-CM | POA: Diagnosis not present

## 2024-02-20 DIAGNOSIS — M47819 Spondylosis without myelopathy or radiculopathy, site unspecified: Secondary | ICD-10-CM

## 2024-02-20 DIAGNOSIS — M545 Low back pain, unspecified: Secondary | ICD-10-CM

## 2024-02-20 DIAGNOSIS — M47816 Spondylosis without myelopathy or radiculopathy, lumbar region: Secondary | ICD-10-CM

## 2024-02-20 DIAGNOSIS — G8929 Other chronic pain: Secondary | ICD-10-CM

## 2024-02-20 NOTE — Progress Notes (Unsigned)
 Pain Scale   Average Pain 7 Patient advising she has pain when standing and walking,patient states she has to sit at times to get any relief         +Driver, -BT, -Dye Allergies.

## 2024-02-20 NOTE — Progress Notes (Unsigned)
 Sue Davidson - 73 y.o. female MRN [id number]  Date of birth: [birth year]  Office Visit Note: Visit Date: 02/20/2024 PCP: Merri Brunette, MD Referred by: Merri Brunette, MD  Subjective: Chief Complaint  Patient presents with   Lower Back - Pain   HPI: Sue Davidson is a 73 y.o. female who comes in today for evaluation of chronic, worsening and severe bilateral buttock pain radiating up to lower back and upper lumbar regions. Patient is well known to [place]. States her pain is now considerably different and does radiate up her back. Her pain worsens with standing, walking and performing household tasks such as washing dishes/cooking. States she has to stop and take breaks frequently. She describes her pain as sore and aching sensation, currently rates as 8 out of 10. Some relief of pain with home exercise regimen, rest and use of medications. Recent lumbar MRI imaging exhibits levocurvature of lumbar spine, multilevel degenerative changes most prominent at L1-L2 and L2-L3, multilevel facet arthrosis, greatest on the left at L3-L4. There is new left foraminal/extraforaminal disc protrusion at L2-L3. No high-grade spinal canal stenosis. She underwent left L3-L4 interlaminar epidural steroid injection in our office on 01/25/2024, she reports some pain relief for about 3 days post procedure.   Patients couse is complicated by morbid obesity, insulin dependent diabetes mellitus, and pulmonary hypertension. She is currently using cane to assist with ambulation. Patient denies focal weakness, numbness and tingling. No recent trauma or falls.           Review of Systems  Musculoskeletal:  Positive for back pain and myalgias.  Neurological:  Negative for tingling, sensory change, focal weakness and weakness.  All other systems reviewed and are negative.  Otherwise per HPI.  Assessment & Plan: Visit Diagnoses:    ICD-10-CM   1. Chronic bilateral low back pain without sciatica  M54.50 Ambulatory  referral to Physical Medicine Rehab   G89.29     2. Spondylosis without myelopathy or radiculopathy  M47.819 Ambulatory referral to Physical Medicine Rehab    3. Facet arthropathy, lumbar  M47.816 Ambulatory referral to Physical Medicine Rehab    4. Abnormality of gait  R26.9 Ambulatory referral to Physical Medicine Rehab       Plan: Findings:  Chronic, worsening and severe bilateral buttock pain radiating up to lower back and upper lumbar regions. Patient continues to have severe pain despite good conservative therapies such as home exercise regimen, rest and use of medications. Patients clinical presentation and exam are consistent with facet mediated pain. She does have pain with lumbar extension on exam today. Severe pain noted when moving from sitting to standing position during exam. There is facet arthropathy noted at levels of L3-L4 and L4-L5. We discussed treatment plan in detail today. Next step is to perform diagnostic and hopefully therapeutic bilateral L3-L4 and L4-L5 facet joint injections under fluoroscopic guidance. I discussed injection procedure in detail today, she has no questions. I do feel there is a myofascial component contributing to her pain, she does have myofascial tenderness noted to both gluteal and lumbar paraspinal regions. Could look at re-grouping with physical therapy in the future. No red flag symptoms noted upon exam today.     Meds & Orders: No orders of the defined types were placed in this encounter.   Orders Placed This Encounter  Procedures   Ambulatory referral to Physical Medicine Rehab    Follow-up: Return for Bilateral L3-L4 and L4-L5 facet joint injections.   Procedures: No procedures performed  Clinical History: MRI LUMBAR SPINE WITHOUT CONTRAST    Unless otherwise stated, the level by level findings below have not significantly changed from the prior MRI of 09/26/2007.   Multilevel disc degeneration, progressed from the prior MRI.  Disc degeneration is greatest to the right at L1-L2 (moderate) and to the right at L2-L3 (moderate/advanced).   T11-T12: This level is imaged in the sagittal plane only. Disc bulge, new from the prior MRI. Mild facet arthrosis. No significant spinal canal or foraminal stenosis.   T12-L1: No significant disc herniation or stenosis. Trace right facet joint effusion.   L1-L2: Disc bulge, new from the prior MRI. Left foraminal disc protrusion, progressed from the prior MRI. Mild to moderate facet arthrosis and ligamentum flavum hypertrophy, new from the prior MRI. No significant spinal canal stenosis. Mild relative left inferior neural foraminal narrowing, unchanged. The left foraminal disc protrusion may contact the undersurface of the exiting left L1 nerve root within the neural foramen. Moderate right neural foraminal narrowing, new from the prior MRI the left foraminal.   L2-L3: Slight grade 1 retrolisthesis, new from the prior MRI. Progressive disc bulge. New superimposed broad-based left foraminal/extraforaminal disc protrusion. Endplate osteophytes, predominantly along the right aspect of the disc space and new from the prior MRI. Mild-to-moderate facet arthrosis and ligamentum flavum hypertrophy, new from the prior MRI. Mild effacement of the ventral thecal sac (without appreciable nerve root impingement). Mild relative left inferior neural foraminal narrowing, new from the prior MRI. The left foraminal/extraforaminal disc protrusion may contact the undersurface of the exiting left L2 nerve root within and beyond the left neural foramen.   L3-L4: Slight grade 1 anterolisthesis. Progressive disc bulge. Facet arthrosis (mild moderate right, moderate left) with ligamentum flavum hypertrophy, progressed. Trace right facet joint effusion, new from the prior MRI. Minimal relative left subarticular narrowing (without nerve root impingement), new from the prior MRI. Mild relative  left neural foraminal narrowing, also new from the prior MRI.   L4-L5: Disc bulge. Progressive facet hypertrophy. Mild ligamentum flavum thickening on the left. No significant spinal canal or foraminal stenosis.   L5-S1: Mild facet arthrosis, progressed. No significant disc herniation or stenosis.   IMPRESSION: 1. Lumbar spondylosis, as outlined and having progressed at multiple levels since the prior MRI of 09/26/2007. Findings are most notably as follows. 2. At L1-L2, there is progressive moderate disc degeneration. A left foraminal disc protrusion contributes to mild relative left inferior neural foraminal narrowing. The disc protrusion may contact the undersurface of the exiting left L1 nerve root within the neural foramen. Moderate right neural foraminal narrowing, new from the prior MRI. 3. At L2-L3, there is progressive moderate/advanced disc degeneration. A new broad-based left foraminal/extraforaminal disc protrusion contributes to mild relative left inferior neural foraminal narrowing. The disc protrusion may contact the undersurface of the exiting left L2 nerve root within and beyond the left neural foramen. 4. No more than mild spinal canal or neural foraminal narrowing at the remaining levels. 5. Multilevel facet arthrosis, greatest on the left at L3-L4 (moderate at this site). 6. Degenerative endplate edema at I6-N6 (mild) and L2-L3 (moderate). 7. Levocurvature of the lumbar spine. 8. Slight grade 1 spondylolisthesis at L2-L3, L3-L4 and L4-L5.    Electronically Signed   By: Jackey Loge D.O.   On: 04/05/2023 08:50   She reports that she has never smoked. She has never used smokeless tobacco. No results for input(s): "HGBA1C", "LABURIC" in the last 8760 hours.  Objective:  VS:  HT:  WT:   BMI:     BP:   HR: bpm  TEMP: ( )  RESP:  Physical Exam Vitals and nursing note reviewed.  HENT:     Head: Normocephalic and atraumatic.     Right Ear: External ear  normal.     Left Ear: External ear normal.     Nose: Nose normal.     Mouth/Throat:     Mouth: Mucous membranes are moist.  Eyes:     Extraocular Movements: Extraocular movements intact.  Cardiovascular:     Rate and Rhythm: Normal rate.     Pulses: Normal pulses.  Pulmonary:     Effort: Pulmonary effort is normal.  Abdominal:     General: Abdomen is flat. There is no distension.  Musculoskeletal:        General: Tenderness present.     Cervical back: Normal range of motion.     Comments: Patient rises from seated position to standing without difficulty. Pain noted with facet loading and lumbar extension. 5/5 strength noted with bilateral hip flexion, knee flexion/extension, ankle dorsiflexion/plantarflexion and EHL. No clonus noted bilaterally. No pain upon palpation of greater trochanters. No pain with internal/external rotation of bilateral hips. Sensation intact bilaterally. Negative slump test bilaterally. Ambulates with cane, gait slow and unsteady. .     Skin:    General: Skin is warm and dry.     Capillary Refill: Capillary refill takes less than 2 seconds.  Neurological:     General: No focal deficit present.     Mental Status: She is alert and oriented to person, place, and time.  Psychiatric:        Mood and Affect: Mood normal.        Behavior: Behavior normal.     Ortho Exam  Imaging: No results found.  Past Medical/Family/Surgical/Social History: Medications & Allergies reviewed per EMR, new medications updated. Patient Active Problem List   Diagnosis Date Noted   Chest pain 04/14/2017   AKI (acute kidney injury) (HCC) 04/14/2017   Hyponatremia 04/14/2017   Carotid disease, bilateral (HCC) 10/06/2016   Normal coronary arteries 10/06/2016   Lateral meniscal tear 06/29/2016   Pulmonary artery hypertension (HCC) 04/23/2016   Exertional dyspnea 01/26/2016   Bilateral leg edema 01/26/2016   Morbid obesity (HCC) 01/26/2016   Dyslipidemia 01/26/2016   Chronic  cholecystitis 10/25/2013   DYSPNEA 06/05/2009   Insulin dependent diabetes mellitus (HCC) 06/04/2009   OSA (obstructive sleep apnea) 06/04/2009   Essential hypertension 06/04/2009   EDEMA LEG 06/04/2009   Past Medical History:  Diagnosis Date   Anemia    hx of   Anginal pain (HCC)    hx of   Anxiety    Depression    Diabetes mellitus without complication (HCC)    GERD (gastroesophageal reflux disease)    Headache(784.0)    Heart murmur    Hyperlipidemia    Hypertension    MRSA (methicillin resistant staph aureus) culture positive    many years ago-abdominal wound- no issuses since.   PONV (postoperative nausea and vomiting)    severe   Sleep apnea    Study done -remains under evaluation- no cpap yet.   Vitamin D deficiency    Family History  Problem Relation Age of Onset   Diabetes Mother    Heart disease Mother        CABG age 27s   Cancer Father        throat and lung   Heart disease Father  Cancer Paternal Aunt        breast   Breast cancer Paternal Aunt    Cancer Paternal Aunt        colon   Past Surgical History:  Procedure Laterality Date   ABDOMINAL HYSTERECTOMY  1970's   APPENDECTOMY  1970's   BREAST BIOPSY Left 2018   CARDIAC CATHETERIZATION     CHOLECYSTECTOMY N/A 10/02/2013   Procedure: LAPAROSCOPIC CHOLECYSTECTOMY;  Surgeon: Keitha Pata, MD;  Location: [facility];  Service: General;  Laterality: N/A;   cyst removed Left    wrist"ganglion"   ELBOW SURGERY Left    HARDWARE REMOVAL Left    ankle   KNEE ARTHROSCOPY Right 06/30/2016   Procedure: ARTHROSCOPY RIGHT KNEE WITH MEDIAL AND LATERAL MENSICAL DEBRIDEMENT;  Surgeon: Liliane Rei, MD;  Location: [facility];  Service: Orthopedics;  Laterality: Right;  LMA   LEG SURGERY Left 1980's   broke leg and ankle   SHOULDER ARTHROSCOPY Bilateral    TONSILLECTOMY  1970's   TUBAL LIGATION  1970's   Social History   Occupational History   Not on file  Tobacco Use   Smoking status: Never   Smokeless  tobacco: Never  Vaping Use   Vaping status: Never Used  Substance and Sexual Activity   Alcohol use: No    Alcohol/week: 0.0 standard drinks of alcohol   Drug use: No   Sexual activity: Yes    Birth control/protection: Spermicide

## 2024-03-05 ENCOUNTER — Other Ambulatory Visit: Payer: Self-pay

## 2024-03-05 ENCOUNTER — Ambulatory Visit: Admitting: Physical Medicine and Rehabilitation

## 2024-03-05 VITALS — BP 155/80 | HR 71

## 2024-03-05 DIAGNOSIS — M47819 Spondylosis without myelopathy or radiculopathy, site unspecified: Secondary | ICD-10-CM | POA: Diagnosis not present

## 2024-03-05 MED ORDER — METHYLPREDNISOLONE ACETATE 40 MG/ML IJ SUSP
40.0000 mg | Freq: Once | INTRAMUSCULAR | Status: AC
  Administered 2024-03-05: 40 mg

## 2024-03-05 NOTE — Procedures (Signed)
 Lumbar Facet Joint Intra-Articular Injection(s) with Fluoroscopic Guidance  Patient: Sue Davidson      Date of Birth: 09-15-1951 MRN: [id number] PCP: Imelda Man, MD      Visit Date: 03/05/2024   Universal Protocol:    Date/Time: 03/05/2024  Consent Given By: the patient  Position: PRONE   Additional Comments: Vital signs were monitored before and after the procedure. Patient was prepped and draped in the usual sterile fashion. The correct patient, procedure, and site was verified.   Injection Procedure Details:  Procedure Site One Meds Administered:  Meds ordered this encounter  Medications   methylPREDNISolone  acetate (DEPO-MEDROL ) injection 40 mg     Laterality: Bilateral  Location/Site:  L3-L4 L4-L5  Needle size: 22 guage  Needle type: Spinal  Needle Placement: Articular  Findings:  -Comments: Excellent flow of contrast producing a partial arthrogram.  Procedure Details: The fluoroscope beam is vertically oriented in AP, and the inferior recess is visualized beneath the lower pole of the inferior apophyseal process, which represents the target point for needle insertion. When direct visualization is difficult the target point is located at the medial projection of the vertebral pedicle. The region overlying each aforementioned target is locally anesthetized with a 1 to 2 ml. volume of 1% Lidocaine  without Epinephrine .   The spinal needle was inserted into each of the above mentioned facet joints using biplanar fluoroscopic guidance. A 0.25 to 0.5 ml. volume of Isovue -250 was injected and a partial facet joint arthrogram was obtained. A single spot film was obtained of the resulting arthrogram.    One to 1.25 ml of the steroid/anesthetic solution was then injected into each of the facet joints noted above.   Additional Comments:  The patient tolerated the procedure well Dressing: 2 x 2 sterile gauze and Band-Aid    Post-procedure details: Patient was  observed during the procedure. Post-procedure instructions were reviewed.  Patient left the clinic in stable condition.

## 2024-03-05 NOTE — Progress Notes (Signed)
 Sue Davidson - 73 y.o. female MRN [id number]  Date of birth: 1950-11-23  Office Visit Note: Visit Date: 03/05/2024 PCP: Imelda Man, MD Referred by: Imelda Man, MD  Subjective: Chief Complaint  Patient presents with   Lower Back - Pain   HPI:  Sue Davidson is a 73 y.o. female who comes in today at the request of Elvan Hamel, FNP for planned Bilateral  L3-4 and L4-5 Lumbar facet/medial branch block with fluoroscopic guidance.  The patient has failed conservative care including home exercise, medications, time and activity modification.  This injection will be diagnostic and hopefully therapeutic.  Please see requesting physician notes for further details and justification.  Exam has shown concordant pain with facet joint loading.   ROS Otherwise per HPI.  Assessment & Plan: Visit Diagnoses:    ICD-10-CM   1. Spondylosis without myelopathy or radiculopathy  M47.819 XR C-ARM NO REPORT    Facet Injection    methylPREDNISolone  acetate (DEPO-MEDROL ) injection 40 mg      Plan: No additional findings.   Meds & Orders:  Meds ordered this encounter  Medications   methylPREDNISolone  acetate (DEPO-MEDROL ) injection 40 mg    Orders Placed This Encounter  Procedures   Facet Injection   XR C-ARM NO REPORT    Follow-up: Return for visit to requesting provider as needed.   Procedures: No procedures performed  Lumbar Facet Joint Intra-Articular Injection(s) with Fluoroscopic Guidance  Patient: Sue Davidson      Date of Birth: 1951-05-09 MRN: [id number] PCP: Imelda Man, MD      Visit Date: 03/05/2024   Universal Protocol:    Date/Time: 03/05/2024  Consent Given By: the patient  Position: PRONE   Additional Comments: Vital signs were monitored before and after the procedure. Patient was prepped and draped in the usual sterile fashion. The correct patient, procedure, and site was verified.   Injection Procedure Details:  Procedure Site One Meds  Administered:  Meds ordered this encounter  Medications   methylPREDNISolone  acetate (DEPO-MEDROL ) injection 40 mg     Laterality: Bilateral  Location/Site:  L3-L4 L4-L5  Needle size: 22 guage  Needle type: Spinal  Needle Placement: Articular  Findings:  -Comments: Excellent flow of contrast producing a partial arthrogram.  Procedure Details: The fluoroscope beam is vertically oriented in AP, and the inferior recess is visualized beneath the lower pole of the inferior apophyseal process, which represents the target point for needle insertion. When direct visualization is difficult the target point is located at the medial projection of the vertebral pedicle. The region overlying each aforementioned target is locally anesthetized with a 1 to 2 ml. volume of 1% Lidocaine  without Epinephrine .   The spinal needle was inserted into each of the above mentioned facet joints using biplanar fluoroscopic guidance. A 0.25 to 0.5 ml. volume of Isovue -250 was injected and a partial facet joint arthrogram was obtained. A single spot film was obtained of the resulting arthrogram.    One to 1.25 ml of the steroid/anesthetic solution was then injected into each of the facet joints noted above.   Additional Comments:  The patient tolerated the procedure well Dressing: 2 x 2 sterile gauze and Band-Aid    Post-procedure details: Patient was observed during the procedure. Post-procedure instructions were reviewed.  Patient left the clinic in stable condition.    Clinical History: MRI LUMBAR SPINE WITHOUT CONTRAST    Unless otherwise stated, the level by level findings below have not significantly changed from the prior MRI  of 09/26/2007.   Multilevel disc degeneration, progressed from the prior MRI. Disc degeneration is greatest to the right at L1-L2 (moderate) and to the right at L2-L3 (moderate/advanced).   T11-T12: This level is imaged in the sagittal plane only. Disc bulge, new from  the prior MRI. Mild facet arthrosis. No significant spinal canal or foraminal stenosis.   T12-L1: No significant disc herniation or stenosis. Trace right facet joint effusion.   L1-L2: Disc bulge, new from the prior MRI. Left foraminal disc protrusion, progressed from the prior MRI. Mild to moderate facet arthrosis and ligamentum flavum hypertrophy, new from the prior MRI. No significant spinal canal stenosis. Mild relative left inferior neural foraminal narrowing, unchanged. The left foraminal disc protrusion may contact the undersurface of the exiting left L1 nerve root within the neural foramen. Moderate right neural foraminal narrowing, new from the prior MRI the left foraminal.   L2-L3: Slight grade 1 retrolisthesis, new from the prior MRI. Progressive disc bulge. New superimposed broad-based left foraminal/extraforaminal disc protrusion. Endplate osteophytes, predominantly along the right aspect of the disc space and new from the prior MRI. Mild-to-moderate facet arthrosis and ligamentum flavum hypertrophy, new from the prior MRI. Mild effacement of the ventral thecal sac (without appreciable nerve root impingement). Mild relative left inferior neural foraminal narrowing, new from the prior MRI. The left foraminal/extraforaminal disc protrusion may contact the undersurface of the exiting left L2 nerve root within and beyond the left neural foramen.   L3-L4: Slight grade 1 anterolisthesis. Progressive disc bulge. Facet arthrosis (mild moderate right, moderate left) with ligamentum flavum hypertrophy, progressed. Trace right facet joint effusion, new from the prior MRI. Minimal relative left subarticular narrowing (without nerve root impingement), new from the prior MRI. Mild relative left neural foraminal narrowing, also new from the prior MRI.   L4-L5: Disc bulge. Progressive facet hypertrophy. Mild ligamentum flavum thickening on the left. No significant spinal canal  or foraminal stenosis.   L5-S1: Mild facet arthrosis, progressed. No significant disc herniation or stenosis.   IMPRESSION: 1. Lumbar spondylosis, as outlined and having progressed at multiple levels since the prior MRI of 09/26/2007. Findings are most notably as follows. 2. At L1-L2, there is progressive moderate disc degeneration. A left foraminal disc protrusion contributes to mild relative left inferior neural foraminal narrowing. The disc protrusion may contact the undersurface of the exiting left L1 nerve root within the neural foramen. Moderate right neural foraminal narrowing, new from the prior MRI. 3. At L2-L3, there is progressive moderate/advanced disc degeneration. A new broad-based left foraminal/extraforaminal disc protrusion contributes to mild relative left inferior neural foraminal narrowing. The disc protrusion may contact the undersurface of the exiting left L2 nerve root within and beyond the left neural foramen. 4. No more than mild spinal canal or neural foraminal narrowing at the remaining levels. 5. Multilevel facet arthrosis, greatest on the left at L3-L4 (moderate at this site). 6. Degenerative endplate edema at I6-N6 (mild) and L2-L3 (moderate). 7. Levocurvature of the lumbar spine. 8. Slight grade 1 spondylolisthesis at L2-L3, L3-L4 and L4-L5.    Electronically Signed   By: Bascom Lily D.O.   On: 04/05/2023 08:50     Objective:  VS:  HT:    WT:   BMI:     BP:(!) 155/80  HR:71bpm  TEMP: ( )  RESP:  Physical Exam Vitals and nursing note reviewed.  Constitutional:      General: She is not in acute distress.    Appearance: Normal appearance. She is obese.  She is not ill-appearing.  HENT:     Head: Normocephalic and atraumatic.     Right Ear: External ear normal.     Left Ear: External ear normal.  Eyes:     Extraocular Movements: Extraocular movements intact.  Cardiovascular:     Rate and Rhythm: Normal rate.     Pulses: Normal pulses.   Pulmonary:     Effort: Pulmonary effort is normal. No respiratory distress.  Abdominal:     General: There is no distension.     Palpations: Abdomen is soft.  Musculoskeletal:        General: Tenderness present.     Cervical back: Neck supple.     Right lower leg: No edema.     Left lower leg: No edema.     Comments: Patient has good distal strength with no pain over the greater trochanters.  No clonus or focal weakness.  Skin:    Findings: No erythema, lesion or rash.  Neurological:     General: No focal deficit present.     Mental Status: She is alert and oriented to person, place, and time.     Sensory: No sensory deficit.     Motor: No weakness or abnormal muscle tone.     Coordination: Coordination normal.  Psychiatric:        Mood and Affect: Mood normal.        Behavior: Behavior normal.      Imaging: No results found.

## 2024-03-05 NOTE — Progress Notes (Signed)
 Pain Scale   Average Pain 8 Patient advised she has constant pain without any relief        +Driver, -BT, -Dye Allergies.

## 2024-03-05 NOTE — Patient Instructions (Signed)

## 2024-03-06 DIAGNOSIS — E1165 Type 2 diabetes mellitus with hyperglycemia: Secondary | ICD-10-CM | POA: Diagnosis not present

## 2024-03-08 DIAGNOSIS — E119 Type 2 diabetes mellitus without complications: Secondary | ICD-10-CM | POA: Diagnosis not present

## 2024-03-13 DIAGNOSIS — E559 Vitamin D deficiency, unspecified: Secondary | ICD-10-CM | POA: Diagnosis not present

## 2024-03-13 DIAGNOSIS — E7800 Pure hypercholesterolemia, unspecified: Secondary | ICD-10-CM | POA: Diagnosis not present

## 2024-03-13 DIAGNOSIS — E039 Hypothyroidism, unspecified: Secondary | ICD-10-CM | POA: Diagnosis not present

## 2024-03-13 DIAGNOSIS — E1165 Type 2 diabetes mellitus with hyperglycemia: Secondary | ICD-10-CM | POA: Diagnosis not present

## 2024-03-28 ENCOUNTER — Telehealth: Payer: Self-pay | Admitting: Orthopedic Surgery

## 2024-03-28 NOTE — Telephone Encounter (Signed)
 Ms. Petruska is calling in to give Dr. Daisey Dryer an update on how she is doing since she received her injection.  She states that "it worked for about a week", and now her pain is back and is "unbearable".  She has been using Voltaren gel on the area which works for an hour or two and then she needs to re-apply.  Please call to discuss [phone].

## 2024-03-30 ENCOUNTER — Other Ambulatory Visit: Payer: Self-pay | Admitting: Physical Medicine and Rehabilitation

## 2024-03-30 MED ORDER — GABAPENTIN 100 MG PO CAPS: 100.0000 mg | ORAL_CAPSULE | Freq: Every day | ORAL | 0 refills | Status: DC

## 2024-04-08 DIAGNOSIS — E119 Type 2 diabetes mellitus without complications: Secondary | ICD-10-CM | POA: Diagnosis not present

## 2024-04-10 ENCOUNTER — Telehealth: Payer: Self-pay | Admitting: Physical Medicine and Rehabilitation

## 2024-04-10 NOTE — Telephone Encounter (Signed)
 Patient called she would like to know if she could use Voltaren cream on her back? Her [phone]

## 2024-04-13 DIAGNOSIS — E1165 Type 2 diabetes mellitus with hyperglycemia: Secondary | ICD-10-CM | POA: Diagnosis not present

## 2024-04-13 DIAGNOSIS — E039 Hypothyroidism, unspecified: Secondary | ICD-10-CM | POA: Diagnosis not present

## 2024-04-20 DIAGNOSIS — E7800 Pure hypercholesterolemia, unspecified: Secondary | ICD-10-CM | POA: Diagnosis not present

## 2024-04-20 DIAGNOSIS — E1165 Type 2 diabetes mellitus with hyperglycemia: Secondary | ICD-10-CM | POA: Diagnosis not present

## 2024-04-20 DIAGNOSIS — E039 Hypothyroidism, unspecified: Secondary | ICD-10-CM | POA: Diagnosis not present

## 2024-04-20 DIAGNOSIS — I10 Essential (primary) hypertension: Secondary | ICD-10-CM | POA: Diagnosis not present

## 2024-05-02 DIAGNOSIS — E559 Vitamin D deficiency, unspecified: Secondary | ICD-10-CM | POA: Diagnosis not present

## 2024-05-02 DIAGNOSIS — E039 Hypothyroidism, unspecified: Secondary | ICD-10-CM | POA: Diagnosis not present

## 2024-05-02 DIAGNOSIS — E7800 Pure hypercholesterolemia, unspecified: Secondary | ICD-10-CM | POA: Diagnosis not present

## 2024-05-02 DIAGNOSIS — I10 Essential (primary) hypertension: Secondary | ICD-10-CM | POA: Diagnosis not present

## 2024-05-02 DIAGNOSIS — E1165 Type 2 diabetes mellitus with hyperglycemia: Secondary | ICD-10-CM | POA: Diagnosis not present

## 2024-05-02 DIAGNOSIS — N1831 Chronic kidney disease, stage 3a: Secondary | ICD-10-CM | POA: Diagnosis not present

## 2024-05-02 LAB — LAB REPORT - SCANNED: EGFR: 45

## 2024-05-08 DIAGNOSIS — E119 Type 2 diabetes mellitus without complications: Secondary | ICD-10-CM | POA: Diagnosis not present

## 2024-05-09 DIAGNOSIS — I700 Atherosclerosis of aorta: Secondary | ICD-10-CM | POA: Diagnosis not present

## 2024-05-09 DIAGNOSIS — I10 Essential (primary) hypertension: Secondary | ICD-10-CM | POA: Diagnosis not present

## 2024-05-09 DIAGNOSIS — E559 Vitamin D deficiency, unspecified: Secondary | ICD-10-CM | POA: Diagnosis not present

## 2024-05-09 DIAGNOSIS — Z0000 Encounter for general adult medical examination without abnormal findings: Secondary | ICD-10-CM | POA: Diagnosis not present

## 2024-05-09 DIAGNOSIS — E039 Hypothyroidism, unspecified: Secondary | ICD-10-CM | POA: Diagnosis not present

## 2024-06-08 DIAGNOSIS — E119 Type 2 diabetes mellitus without complications: Secondary | ICD-10-CM | POA: Diagnosis not present

## 2024-06-15 ENCOUNTER — Other Ambulatory Visit: Payer: Self-pay

## 2024-06-15 ENCOUNTER — Emergency Department (HOSPITAL_BASED_OUTPATIENT_CLINIC_OR_DEPARTMENT_OTHER)

## 2024-06-15 ENCOUNTER — Ambulatory Visit: Admitting: Cardiology

## 2024-06-15 ENCOUNTER — Emergency Department (HOSPITAL_BASED_OUTPATIENT_CLINIC_OR_DEPARTMENT_OTHER)
Admission: EM | Admit: 2024-06-15 | Discharge: 2024-06-15 | Disposition: A | Discharge status: Home / Self Care | Source: Home / Self Care | Attending: Emergency Medicine | Admitting: Emergency Medicine

## 2024-06-15 ENCOUNTER — Encounter (HOSPITAL_BASED_OUTPATIENT_CLINIC_OR_DEPARTMENT_OTHER): Payer: Self-pay | Admitting: Emergency Medicine

## 2024-06-15 DIAGNOSIS — R296 Repeated falls: Secondary | ICD-10-CM | POA: Diagnosis not present

## 2024-06-15 DIAGNOSIS — N200 Calculus of kidney: Secondary | ICD-10-CM | POA: Diagnosis not present

## 2024-06-15 DIAGNOSIS — N138 Other obstructive and reflux uropathy: Secondary | ICD-10-CM | POA: Diagnosis not present

## 2024-06-15 DIAGNOSIS — D649 Anemia, unspecified: Secondary | ICD-10-CM | POA: Diagnosis not present

## 2024-06-15 DIAGNOSIS — K219 Gastro-esophageal reflux disease without esophagitis: Secondary | ICD-10-CM | POA: Diagnosis not present

## 2024-06-15 DIAGNOSIS — R6520 Severe sepsis without septic shock: Secondary | ICD-10-CM | POA: Diagnosis not present

## 2024-06-15 DIAGNOSIS — N209 Urinary calculus, unspecified: Secondary | ICD-10-CM | POA: Diagnosis not present

## 2024-06-15 DIAGNOSIS — Z9049 Acquired absence of other specified parts of digestive tract: Secondary | ICD-10-CM | POA: Diagnosis not present

## 2024-06-15 DIAGNOSIS — I5033 Acute on chronic diastolic (congestive) heart failure: Secondary | ICD-10-CM | POA: Diagnosis not present

## 2024-06-15 DIAGNOSIS — Z794 Long term (current) use of insulin: Secondary | ICD-10-CM | POA: Diagnosis not present

## 2024-06-15 DIAGNOSIS — N132 Hydronephrosis with renal and ureteral calculous obstruction: Secondary | ICD-10-CM | POA: Insufficient documentation

## 2024-06-15 DIAGNOSIS — N201 Calculus of ureter: Secondary | ICD-10-CM | POA: Diagnosis not present

## 2024-06-15 DIAGNOSIS — R2689 Other abnormalities of gait and mobility: Secondary | ICD-10-CM | POA: Diagnosis not present

## 2024-06-15 DIAGNOSIS — N1339 Other hydronephrosis: Secondary | ICD-10-CM | POA: Diagnosis not present

## 2024-06-15 DIAGNOSIS — Z0389 Encounter for observation for other suspected diseases and conditions ruled out: Secondary | ICD-10-CM | POA: Diagnosis not present

## 2024-06-15 DIAGNOSIS — F419 Anxiety disorder, unspecified: Secondary | ICD-10-CM | POA: Diagnosis not present

## 2024-06-15 DIAGNOSIS — M25562 Pain in left knee: Secondary | ICD-10-CM | POA: Diagnosis not present

## 2024-06-15 DIAGNOSIS — D6959 Other secondary thrombocytopenia: Secondary | ICD-10-CM | POA: Diagnosis not present

## 2024-06-15 DIAGNOSIS — M6281 Muscle weakness (generalized): Secondary | ICD-10-CM | POA: Diagnosis not present

## 2024-06-15 DIAGNOSIS — Z6841 Body Mass Index (BMI) 40.0 and over, adult: Secondary | ICD-10-CM | POA: Diagnosis not present

## 2024-06-15 DIAGNOSIS — I509 Heart failure, unspecified: Secondary | ICD-10-CM | POA: Diagnosis not present

## 2024-06-15 DIAGNOSIS — E876 Hypokalemia: Secondary | ICD-10-CM | POA: Diagnosis not present

## 2024-06-15 DIAGNOSIS — Z7401 Bed confinement status: Secondary | ICD-10-CM | POA: Diagnosis not present

## 2024-06-15 DIAGNOSIS — W19XXXA Unspecified fall, initial encounter: Secondary | ICD-10-CM | POA: Diagnosis not present

## 2024-06-15 DIAGNOSIS — E66813 Obesity, class 3: Secondary | ICD-10-CM | POA: Diagnosis not present

## 2024-06-15 DIAGNOSIS — E860 Dehydration: Secondary | ICD-10-CM | POA: Diagnosis not present

## 2024-06-15 DIAGNOSIS — D696 Thrombocytopenia, unspecified: Secondary | ICD-10-CM | POA: Diagnosis not present

## 2024-06-15 DIAGNOSIS — R109 Unspecified abdominal pain: Secondary | ICD-10-CM | POA: Diagnosis not present

## 2024-06-15 DIAGNOSIS — N139 Obstructive and reflux uropathy, unspecified: Secondary | ICD-10-CM | POA: Diagnosis not present

## 2024-06-15 DIAGNOSIS — F32A Depression, unspecified: Secondary | ICD-10-CM | POA: Diagnosis not present

## 2024-06-15 DIAGNOSIS — Z7982 Long term (current) use of aspirin: Secondary | ICD-10-CM | POA: Insufficient documentation

## 2024-06-15 DIAGNOSIS — I10 Essential (primary) hypertension: Secondary | ICD-10-CM | POA: Diagnosis not present

## 2024-06-15 DIAGNOSIS — F3289 Other specified depressive episodes: Secondary | ICD-10-CM | POA: Diagnosis not present

## 2024-06-15 DIAGNOSIS — M25559 Pain in unspecified hip: Secondary | ICD-10-CM | POA: Diagnosis not present

## 2024-06-15 DIAGNOSIS — A419 Sepsis, unspecified organism: Secondary | ICD-10-CM | POA: Diagnosis not present

## 2024-06-15 DIAGNOSIS — N179 Acute kidney failure, unspecified: Secondary | ICD-10-CM | POA: Diagnosis not present

## 2024-06-15 DIAGNOSIS — I2721 Secondary pulmonary arterial hypertension: Secondary | ICD-10-CM | POA: Diagnosis not present

## 2024-06-15 DIAGNOSIS — E871 Hypo-osmolality and hyponatremia: Secondary | ICD-10-CM | POA: Diagnosis not present

## 2024-06-15 DIAGNOSIS — I110 Hypertensive heart disease with heart failure: Secondary | ICD-10-CM | POA: Diagnosis not present

## 2024-06-15 DIAGNOSIS — M25551 Pain in right hip: Secondary | ICD-10-CM | POA: Diagnosis not present

## 2024-06-15 DIAGNOSIS — A4151 Sepsis due to Escherichia coli [E. coli]: Secondary | ICD-10-CM | POA: Diagnosis not present

## 2024-06-15 DIAGNOSIS — L89612 Pressure ulcer of right heel, stage 2: Secondary | ICD-10-CM | POA: Diagnosis not present

## 2024-06-15 DIAGNOSIS — Z1152 Encounter for screening for COVID-19: Secondary | ICD-10-CM | POA: Diagnosis not present

## 2024-06-15 DIAGNOSIS — R2681 Unsteadiness on feet: Secondary | ICD-10-CM | POA: Diagnosis not present

## 2024-06-15 DIAGNOSIS — E039 Hypothyroidism, unspecified: Secondary | ICD-10-CM | POA: Diagnosis not present

## 2024-06-15 DIAGNOSIS — G4733 Obstructive sleep apnea (adult) (pediatric): Secondary | ICD-10-CM | POA: Diagnosis not present

## 2024-06-15 DIAGNOSIS — R7401 Elevation of levels of liver transaminase levels: Secondary | ICD-10-CM | POA: Diagnosis present

## 2024-06-15 DIAGNOSIS — R6521 Severe sepsis with septic shock: Secondary | ICD-10-CM | POA: Diagnosis not present

## 2024-06-15 DIAGNOSIS — E1165 Type 2 diabetes mellitus with hyperglycemia: Secondary | ICD-10-CM | POA: Diagnosis not present

## 2024-06-15 DIAGNOSIS — R531 Weakness: Secondary | ICD-10-CM | POA: Diagnosis not present

## 2024-06-15 DIAGNOSIS — R7881 Bacteremia: Secondary | ICD-10-CM | POA: Diagnosis not present

## 2024-06-15 DIAGNOSIS — E785 Hyperlipidemia, unspecified: Secondary | ICD-10-CM | POA: Diagnosis not present

## 2024-06-15 DIAGNOSIS — N136 Pyonephrosis: Secondary | ICD-10-CM | POA: Diagnosis not present

## 2024-06-15 DIAGNOSIS — R1310 Dysphagia, unspecified: Secondary | ICD-10-CM | POA: Diagnosis not present

## 2024-06-15 DIAGNOSIS — D559 Anemia due to enzyme disorder, unspecified: Secondary | ICD-10-CM | POA: Diagnosis not present

## 2024-06-15 DIAGNOSIS — Z743 Need for continuous supervision: Secondary | ICD-10-CM | POA: Diagnosis not present

## 2024-06-15 DIAGNOSIS — I959 Hypotension, unspecified: Secondary | ICD-10-CM | POA: Diagnosis not present

## 2024-06-15 DIAGNOSIS — Z960 Presence of urogenital implants: Secondary | ICD-10-CM | POA: Diagnosis not present

## 2024-06-15 LAB — URINALYSIS, ROUTINE W REFLEX MICROSCOPIC
Bilirubin Urine: NEGATIVE
Glucose, UA: 100 mg/dL — AB
Ketones, ur: NEGATIVE mg/dL
Nitrite: NEGATIVE
Protein, ur: NEGATIVE mg/dL
Specific Gravity, Urine: 1.015 (ref 1.005–1.030)
pH: 6.5 (ref 5.0–8.0)

## 2024-06-15 LAB — URINALYSIS, MICROSCOPIC (REFLEX)

## 2024-06-15 MED ORDER — ONDANSETRON HCL 4 MG/2ML IJ SOLN
4.0000 mg | Freq: Once | INTRAMUSCULAR | Status: AC
  Administered 2024-06-15: 4 mg via INTRAVENOUS
  Filled 2024-06-15: qty 2

## 2024-06-15 MED ORDER — TAMSULOSIN HCL 0.4 MG PO CAPS: 0.4000 mg | ORAL_CAPSULE | Freq: Every day | ORAL | 0 refills | Status: DC

## 2024-06-15 MED ORDER — KETOROLAC TROMETHAMINE 15 MG/ML IJ SOLN
15.0000 mg | Freq: Once | INTRAMUSCULAR | Status: AC
  Administered 2024-06-15: 15 mg via INTRAVENOUS
  Filled 2024-06-15: qty 1

## 2024-06-15 MED ORDER — ONDANSETRON 4 MG PO TBDP: ORAL_TABLET | ORAL | 0 refills | Status: DC

## 2024-06-15 MED ORDER — MORPHINE SULFATE 15 MG PO TABS: 7.5000 mg | ORAL_TABLET | ORAL | 0 refills | Status: DC | PRN

## 2024-06-15 MED ORDER — MORPHINE SULFATE (PF) 4 MG/ML IV SOLN
4.0000 mg | Freq: Once | INTRAVENOUS | Status: AC
  Administered 2024-06-15: 4 mg via INTRAVENOUS
  Filled 2024-06-15: qty 1

## 2024-06-15 MED ORDER — SODIUM CHLORIDE 0.9 % IV BOLUS
1000.0000 mL | Freq: Once | INTRAVENOUS | Status: AC
  Administered 2024-06-15: 1000 mL via INTRAVENOUS

## 2024-06-15 NOTE — ED Triage Notes (Signed)
 Pt c/o possible L kidney stone, L flank pain started this AM.   Hx of kidney stones.

## 2024-06-15 NOTE — Discharge Instructions (Addendum)
 Call the urology office on Monday and let them know about your visit.  Please return for uncontrolled pain or if you develop a fever.  Take 4 over the counter ibuprofen tablets 3 times a day or 2 over-the-counter naproxen tablets twice a day for pain. Also take tylenol  1000mg (2 extra strength) four times a day.   Then take the pain medicine if you feel like you need it. Narcotics do not help with the pain, they only make you care about it less.  You can become addicted to this, people may break into your house to steal it.  It will constipate you.  If you drive under the influence of this medicine you can get a DUI.

## 2024-06-15 NOTE — ED Provider Notes (Signed)
 [place] [facility] Provider Note   CSN: [id number] Arrival date & time: 06/15/24  1559     Patient presents with: Flank Pain   Sue Davidson is a 73 y.o. female.   73 yo F with a chief complaints of left-sided back pain.  This been going on since earlier today.  She has a history of kidney stones her last 1 occurred about 3 years ago she thinks this feels very similar.  Causing her to have nausea and vomiting.  No diarrhea.  Everything seems to make it worse nothing seems to make it better.  No fevers or chills.  No urinary symptoms.   Flank Pain       Prior to Admission medications   Medication Sig Start Date End Date Taking? Authorizing Provider  morphine  (MSIR) 15 MG tablet Take 0.5 tablets (7.5 mg total) by mouth every 4 (four) hours as needed for severe pain (pain score 7-10). 06/15/24  Yes Emil Share, DO  ondansetron  (ZOFRAN -ODT) 4 MG disintegrating tablet 4mg  ODT q4 hours prn nausea/vomit 06/15/24  Yes Anessa Charley, DO  tamsulosin  (FLOMAX ) 0.4 MG CAPS capsule Take 1 capsule (0.4 mg total) by mouth daily after supper. 06/15/24  Yes Emil Share, DO  acetaminophen  (TYLENOL ) 500 MG tablet Take 500 mg by mouth every 6 (six) hours as needed for moderate pain.    [provider]  amLODipine  (NORVASC ) 2.5 MG tablet Take 1 tablet (2.5 mg total) by mouth daily. 07/28/23 07/27/24  Croitoru, Mihai, MD  aspirin  81 MG tablet Take 81 mg by mouth daily.    [provider]  buPROPion (WELLBUTRIN XL) 150 MG 24 hr tablet Take 1 tablet by mouth every morning.    [provider]  cholecalciferol (VITAMIN D) 1000 units tablet Take 1,000 Units by mouth daily.    [provider]  Continuous Glucose Sensor (FREESTYLE LIBRE 14 DAY SENSOR) MISC USE 1 SENSOR EVERY TWO WEEKS for 4    [provider]  furosemide  (LASIX ) 80 MG tablet TAKE 1 TABLET BY MOUTH EVERY DAY 10/30/19   Meng, Hao, PA  gabapentin  (NEURONTIN ) 100 MG capsule  Take 1 capsule (100 mg total) by mouth at bedtime. 03/30/24   Williams, Megan E, NP  glucose blood (ONETOUCH ULTRA TEST) test strip 1 each as needed.    [provider]  insulin  NPH-regular Human (NOVOLIN 70/30) (70-30) 100 UNIT/ML injection Inject into the skin 2 (two) times daily with a meal.    [provider]  Insulin  Pen Needle (BD PEN NEEDLE NANO 2ND GEN) 32G X 4 MM MISC as directed subcutaneous Once a day for 30 days 05/04/22   [provider]  olmesartan  (BENICAR ) 40 MG tablet TAKE 1 TABLET BY MOUTH EVERY DAY 11/19/19   Croitoru, Mihai, MD  omeprazole (PRILOSEC) 40 MG capsule Take 40 mg by mouth daily. Reported on 01/19/2016 07/18/13   [provider]  ondansetron  (ZOFRAN ) 8 MG tablet Take 8 mg by mouth 2 (two) times daily as needed for nausea or vomiting.    [provider]  rosuvastatin  (CRESTOR ) 20 MG tablet Take 20 mg by mouth daily with breakfast.     [provider]  sertraline (ZOLOFT) 100 MG tablet Take 100 mg by mouth daily.    [provider]    Allergies: Pioglitazone, Semaglutide, and Tape    Review of Systems  Genitourinary:  Positive for flank pain.    Updated Vital Signs BP (!) 145/64  Pulse 79   Temp 98.1 F (36.7 C)   Resp 18   Ht 5' 6 (1.676 m)   Wt 100.2 kg   SpO2 94%   BMI 35.67 kg/m   Physical Exam Vitals and nursing note reviewed.  Constitutional:      General: She is not in acute distress.    Appearance: She is well-developed. She is not diaphoretic.  HENT:     Head: Normocephalic and atraumatic.  Eyes:     Pupils: Pupils are equal, round, and reactive to light.  Cardiovascular:     Rate and Rhythm: Normal rate and regular rhythm.     Heart sounds: No murmur heard.    No friction rub. No gallop.  Pulmonary:     Effort: Pulmonary effort is normal.     Breath sounds: No wheezing or rales.  Abdominal:     General: There is no distension.     Palpations: Abdomen is soft.      Tenderness: There is no abdominal tenderness.     Comments: Benign abdominal exam  Musculoskeletal:        General: No tenderness.     Cervical back: Normal range of motion and neck supple.  Skin:    General: Skin is warm and dry.  Neurological:     Mental Status: She is alert and oriented to person, place, and time.  Psychiatric:        Behavior: Behavior normal.     (all labs ordered are listed, but only abnormal results are displayed) Labs Reviewed  URINALYSIS, ROUTINE W REFLEX MICROSCOPIC - Abnormal; Notable for the following components:      Result Value   Glucose, UA 100 (*)    Hgb urine dipstick TRACE (*)    Leukocytes,Ua TRACE (*)    All other components within normal limits  URINALYSIS, MICROSCOPIC (REFLEX) - Abnormal; Notable for the following components:   Bacteria, UA RARE (*)    All other components within normal limits    EKG: None  Radiology: CT Renal Stone Study Result Date: 06/15/2024 CLINICAL DATA:  Acute left flank pain. EXAM: CT ABDOMEN AND PELVIS WITHOUT CONTRAST TECHNIQUE: Multidetector CT imaging of the abdomen and pelvis was performed following the standard protocol without IV contrast. RADIATION DOSE REDUCTION: This exam was performed according to the departmental dose-optimization program which includes automated exposure control, adjustment of the mA and/or kV according to patient size and/or use of iterative reconstruction technique. COMPARISON:  March 04, 2021. FINDINGS: Lower chest: No acute abnormality. Hepatobiliary: No focal liver abnormality is seen. Status post cholecystectomy. No biliary dilatation. Pancreas: Unremarkable. No pancreatic ductal dilatation or surrounding inflammatory changes. Spleen: Normal in size without focal abnormality. Adrenals/Urinary Tract: Adrenal glands appear normal. Mild left hydronephrosis is noted secondary to 7 mm calculus in proximal left ureter just beyond ureteropelvic junction. Urinary bladder is unremarkable. Right  kidney and ureter are unremarkable. Stomach/Bowel: Stomach is unremarkable. There is no evidence of bowel obstruction or inflammation. Vascular/Lymphatic: Aortic atherosclerosis. No enlarged abdominal or pelvic lymph nodes. Reproductive: Status post hysterectomy. No adnexal masses. Other: No ascites or hernia. Musculoskeletal: No acute or significant osseous findings. IMPRESSION: Mild left hydronephrosis is noted secondary to 7 mm calculus in proximal left ureter just beyond ureteropelvic junction. Aortic Atherosclerosis (ICD10-I70.0). Electronically Signed   By: Lynwood Landy Raddle M.D.   On: 06/15/2024 17:24     Procedures   Medications Ordered in the ED  morphine  (PF) 4 MG/ML injection 4 mg (4 mg Intravenous Given  06/15/24 1655)  ondansetron  (ZOFRAN ) injection 4 mg (4 mg Intravenous Given 06/15/24 1655)  sodium chloride  0.9 % bolus 1,000 mL (0 mLs Intravenous Stopped 06/15/24 1741)  ketorolac  (TORADOL ) 15 MG/ML injection 15 mg (15 mg Intravenous Given 06/15/24 1829)                                    Medical Decision Making Amount and/or Complexity of Data Reviewed Labs: ordered. Radiology: ordered.  Risk Prescription drug management.   73 yo F with a chief complaints of left-sided back pain.  This been going on since this morning.  Feels like a prior kidney stone.  Having nausea and vomiting with that as well.  No urinary symptoms no fevers.  Will obtain a CT stone study.  Treat pain and nausea.  IV fluids.  Reassess.  UA without infection.  CT renal stone study with a proximal left UVJ stone 7 mm.  Discussed results with patient and family.  Discharge home.  Urology follow-up.  7:21 PM:  I have discussed the diagnosis/risks/treatment options with the patient and family.  Evaluation and diagnostic testing in the emergency department does not suggest an emergent condition requiring admission or immediate intervention beyond what has been performed at this time.  They will follow up with  Urology. We also discussed returning to the ED immediately if new or worsening sx occur. We discussed the sx which are most concerning (e.g., sudden worsening pain, fever, inability to tolerate by mouth) that necessitate immediate return. Medications administered to the patient during their visit and any new prescriptions provided to the patient are listed below.  Medications given during this visit Medications  morphine  (PF) 4 MG/ML injection 4 mg (4 mg Intravenous Given 06/15/24 1655)  ondansetron  (ZOFRAN ) injection 4 mg (4 mg Intravenous Given 06/15/24 1655)  sodium chloride  0.9 % bolus 1,000 mL (0 mLs Intravenous Stopped 06/15/24 1741)  ketorolac  (TORADOL ) 15 MG/ML injection 15 mg (15 mg Intravenous Given 06/15/24 1829)     The patient appears reasonably screen and/or stabilized for discharge and I doubt any other medical condition or other [facility] requiring further screening, evaluation, or treatment in the ED at this time prior to discharge.       Final diagnoses:  Ureterolithiasis    ED Discharge Orders          Ordered    morphine  (MSIR) 15 MG tablet  Every 4 hours PRN        06/15/24 1913    ondansetron  (ZOFRAN -ODT) 4 MG disintegrating tablet        06/15/24 1913    tamsulosin  (FLOMAX ) 0.4 MG CAPS capsule  Daily after supper        06/15/24 1913               Emil Share, DO 06/15/24 1922

## 2024-06-15 NOTE — ED Notes (Signed)
 Asked patient to urinate. Patient stated not having to urinate at the time. Will check back shortly to obtain sample

## 2024-06-18 ENCOUNTER — Inpatient Hospital Stay (HOSPITAL_BASED_OUTPATIENT_CLINIC_OR_DEPARTMENT_OTHER)
Admission: EM | Admit: 2024-06-18 | Discharge: 2024-06-28 | DRG: 853 | Disposition: A | Discharge status: Skilled Nursing Facility | Attending: Internal Medicine | Admitting: Internal Medicine

## 2024-06-18 ENCOUNTER — Inpatient Hospital Stay (HOSPITAL_COMMUNITY): Admitting: Anesthesiology

## 2024-06-18 ENCOUNTER — Encounter (HOSPITAL_BASED_OUTPATIENT_CLINIC_OR_DEPARTMENT_OTHER): Payer: Self-pay | Admitting: Emergency Medicine

## 2024-06-18 ENCOUNTER — Inpatient Hospital Stay (HOSPITAL_COMMUNITY)

## 2024-06-18 ENCOUNTER — Encounter (HOSPITAL_COMMUNITY)
Admission: EM | Disposition: A | Discharge status: Skilled Nursing Facility | Payer: Self-pay | Source: Home / Self Care | Attending: Internal Medicine

## 2024-06-18 ENCOUNTER — Emergency Department (HOSPITAL_BASED_OUTPATIENT_CLINIC_OR_DEPARTMENT_OTHER)

## 2024-06-18 ENCOUNTER — Other Ambulatory Visit: Payer: Self-pay

## 2024-06-18 DIAGNOSIS — I5033 Acute on chronic diastolic (congestive) heart failure: Secondary | ICD-10-CM | POA: Diagnosis not present

## 2024-06-18 DIAGNOSIS — A419 Sepsis, unspecified organism: Secondary | ICD-10-CM | POA: Diagnosis not present

## 2024-06-18 DIAGNOSIS — N201 Calculus of ureter: Secondary | ICD-10-CM

## 2024-06-18 DIAGNOSIS — E1165 Type 2 diabetes mellitus with hyperglycemia: Secondary | ICD-10-CM | POA: Diagnosis present

## 2024-06-18 DIAGNOSIS — I110 Hypertensive heart disease with heart failure: Secondary | ICD-10-CM | POA: Diagnosis present

## 2024-06-18 DIAGNOSIS — I5032 Chronic diastolic (congestive) heart failure: Secondary | ICD-10-CM | POA: Diagnosis present

## 2024-06-18 DIAGNOSIS — E785 Hyperlipidemia, unspecified: Secondary | ICD-10-CM | POA: Diagnosis present

## 2024-06-18 DIAGNOSIS — N390 Urinary tract infection, site not specified: Secondary | ICD-10-CM | POA: Diagnosis present

## 2024-06-18 DIAGNOSIS — N209 Urinary calculus, unspecified: Secondary | ICD-10-CM | POA: Diagnosis not present

## 2024-06-18 DIAGNOSIS — N179 Acute kidney failure, unspecified: Secondary | ICD-10-CM | POA: Diagnosis present

## 2024-06-18 DIAGNOSIS — E66812 Obesity, class 2: Secondary | ICD-10-CM | POA: Diagnosis present

## 2024-06-18 DIAGNOSIS — K219 Gastro-esophageal reflux disease without esophagitis: Secondary | ICD-10-CM | POA: Diagnosis present

## 2024-06-18 DIAGNOSIS — Z0389 Encounter for observation for other suspected diseases and conditions ruled out: Secondary | ICD-10-CM | POA: Diagnosis not present

## 2024-06-18 DIAGNOSIS — M25551 Pain in right hip: Secondary | ICD-10-CM | POA: Diagnosis present

## 2024-06-18 DIAGNOSIS — I959 Hypotension, unspecified: Secondary | ICD-10-CM | POA: Diagnosis not present

## 2024-06-18 DIAGNOSIS — Z79890 Hormone replacement therapy (postmenopausal): Secondary | ICD-10-CM

## 2024-06-18 DIAGNOSIS — F3289 Other specified depressive episodes: Secondary | ICD-10-CM | POA: Diagnosis not present

## 2024-06-18 DIAGNOSIS — F419 Anxiety disorder, unspecified: Secondary | ICD-10-CM | POA: Diagnosis present

## 2024-06-18 DIAGNOSIS — N1330 Unspecified hydronephrosis: Secondary | ICD-10-CM | POA: Diagnosis present

## 2024-06-18 DIAGNOSIS — I2721 Secondary pulmonary arterial hypertension: Secondary | ICD-10-CM | POA: Diagnosis present

## 2024-06-18 DIAGNOSIS — Z8249 Family history of ischemic heart disease and other diseases of the circulatory system: Secondary | ICD-10-CM

## 2024-06-18 DIAGNOSIS — E869 Volume depletion, unspecified: Secondary | ICD-10-CM | POA: Diagnosis present

## 2024-06-18 DIAGNOSIS — R6521 Severe sepsis with septic shock: Secondary | ICD-10-CM | POA: Diagnosis present

## 2024-06-18 DIAGNOSIS — G4733 Obstructive sleep apnea (adult) (pediatric): Secondary | ICD-10-CM | POA: Diagnosis not present

## 2024-06-18 DIAGNOSIS — D696 Thrombocytopenia, unspecified: Secondary | ICD-10-CM

## 2024-06-18 DIAGNOSIS — E039 Hypothyroidism, unspecified: Secondary | ICD-10-CM | POA: Diagnosis present

## 2024-06-18 DIAGNOSIS — R296 Repeated falls: Secondary | ICD-10-CM | POA: Diagnosis present

## 2024-06-18 DIAGNOSIS — N136 Pyonephrosis: Secondary | ICD-10-CM | POA: Diagnosis present

## 2024-06-18 DIAGNOSIS — Z7401 Bed confinement status: Secondary | ICD-10-CM | POA: Diagnosis not present

## 2024-06-18 DIAGNOSIS — Z8614 Personal history of Methicillin resistant Staphylococcus aureus infection: Secondary | ICD-10-CM

## 2024-06-18 DIAGNOSIS — Z79899 Other long term (current) drug therapy: Secondary | ICD-10-CM

## 2024-06-18 DIAGNOSIS — Z794 Long term (current) use of insulin: Secondary | ICD-10-CM

## 2024-06-18 DIAGNOSIS — E66813 Obesity, class 3: Secondary | ICD-10-CM | POA: Diagnosis present

## 2024-06-18 DIAGNOSIS — N139 Obstructive and reflux uropathy, unspecified: Secondary | ICD-10-CM | POA: Diagnosis not present

## 2024-06-18 DIAGNOSIS — Z888 Allergy status to other drugs, medicaments and biological substances status: Secondary | ICD-10-CM

## 2024-06-18 DIAGNOSIS — Z91048 Other nonmedicinal substance allergy status: Secondary | ICD-10-CM

## 2024-06-18 DIAGNOSIS — B9620 Unspecified Escherichia coli [E. coli] as the cause of diseases classified elsewhere: Secondary | ICD-10-CM

## 2024-06-18 DIAGNOSIS — I10 Essential (primary) hypertension: Secondary | ICD-10-CM | POA: Diagnosis not present

## 2024-06-18 DIAGNOSIS — Z833 Family history of diabetes mellitus: Secondary | ICD-10-CM

## 2024-06-18 DIAGNOSIS — E876 Hypokalemia: Secondary | ICD-10-CM | POA: Diagnosis present

## 2024-06-18 DIAGNOSIS — Z6841 Body Mass Index (BMI) 40.0 and over, adult: Secondary | ICD-10-CM | POA: Diagnosis not present

## 2024-06-18 DIAGNOSIS — N138 Other obstructive and reflux uropathy: Secondary | ICD-10-CM | POA: Diagnosis not present

## 2024-06-18 DIAGNOSIS — D6959 Other secondary thrombocytopenia: Secondary | ICD-10-CM | POA: Diagnosis present

## 2024-06-18 DIAGNOSIS — R531 Weakness: Secondary | ICD-10-CM | POA: Diagnosis not present

## 2024-06-18 DIAGNOSIS — A4151 Sepsis due to Escherichia coli [E. coli]: Principal | ICD-10-CM | POA: Diagnosis present

## 2024-06-18 DIAGNOSIS — E871 Hypo-osmolality and hyponatremia: Secondary | ICD-10-CM | POA: Diagnosis present

## 2024-06-18 DIAGNOSIS — R7881 Bacteremia: Secondary | ICD-10-CM

## 2024-06-18 DIAGNOSIS — I509 Heart failure, unspecified: Secondary | ICD-10-CM

## 2024-06-18 DIAGNOSIS — R7401 Elevation of levels of liver transaminase levels: Secondary | ICD-10-CM

## 2024-06-18 DIAGNOSIS — Z800 Family history of malignant neoplasm of digestive organs: Secondary | ICD-10-CM

## 2024-06-18 DIAGNOSIS — R6520 Severe sepsis without septic shock: Secondary | ICD-10-CM | POA: Diagnosis present

## 2024-06-18 DIAGNOSIS — Z1152 Encounter for screening for COVID-19: Secondary | ICD-10-CM

## 2024-06-18 DIAGNOSIS — Z7982 Long term (current) use of aspirin: Secondary | ICD-10-CM

## 2024-06-18 DIAGNOSIS — M6281 Muscle weakness (generalized): Secondary | ICD-10-CM | POA: Diagnosis not present

## 2024-06-18 DIAGNOSIS — R109 Unspecified abdominal pain: Secondary | ICD-10-CM | POA: Diagnosis not present

## 2024-06-18 DIAGNOSIS — M25559 Pain in unspecified hip: Secondary | ICD-10-CM | POA: Diagnosis not present

## 2024-06-18 DIAGNOSIS — Z7985 Long-term (current) use of injectable non-insulin antidiabetic drugs: Secondary | ICD-10-CM

## 2024-06-18 DIAGNOSIS — Z885 Allergy status to narcotic agent status: Secondary | ICD-10-CM

## 2024-06-18 DIAGNOSIS — Z960 Presence of urogenital implants: Secondary | ICD-10-CM | POA: Diagnosis not present

## 2024-06-18 DIAGNOSIS — D649 Anemia, unspecified: Secondary | ICD-10-CM | POA: Diagnosis present

## 2024-06-18 DIAGNOSIS — Z801 Family history of malignant neoplasm of trachea, bronchus and lung: Secondary | ICD-10-CM

## 2024-06-18 DIAGNOSIS — Z96641 Presence of right artificial hip joint: Secondary | ICD-10-CM | POA: Diagnosis present

## 2024-06-18 DIAGNOSIS — Z9049 Acquired absence of other specified parts of digestive tract: Secondary | ICD-10-CM | POA: Diagnosis not present

## 2024-06-18 DIAGNOSIS — F32A Depression, unspecified: Secondary | ICD-10-CM | POA: Diagnosis not present

## 2024-06-18 DIAGNOSIS — Z803 Family history of malignant neoplasm of breast: Secondary | ICD-10-CM

## 2024-06-18 DIAGNOSIS — N132 Hydronephrosis with renal and ureteral calculous obstruction: Secondary | ICD-10-CM | POA: Diagnosis not present

## 2024-06-18 HISTORY — PX: CYSTOSCOPY WITH STENT PLACEMENT: SHX5790

## 2024-06-18 LAB — COMPREHENSIVE METABOLIC PANEL WITH GFR
ALT: 49 U/L — ABNORMAL HIGH (ref 0–44)
AST: 209 U/L — ABNORMAL HIGH (ref 15–41)
Albumin: 3.2 g/dL — ABNORMAL LOW (ref 3.5–5.0)
Alkaline Phosphatase: 81 U/L (ref 38–126)
Anion gap: 21 — ABNORMAL HIGH (ref 5–15)
BUN: 44 mg/dL — ABNORMAL HIGH (ref 8–23)
CO2: 21 mmol/L — ABNORMAL LOW (ref 22–32)
Calcium: 8.7 mg/dL — ABNORMAL LOW (ref 8.9–10.3)
Chloride: 88 mmol/L — ABNORMAL LOW (ref 98–111)
Creatinine, Ser: 3.61 mg/dL — ABNORMAL HIGH (ref 0.44–1.00)
GFR, Estimated: 13 mL/min — ABNORMAL LOW (ref >60)
Glucose, Bld: 302 mg/dL — ABNORMAL HIGH (ref 70–99)
Potassium: 3.5 mmol/L (ref 3.5–5.1)
Sodium: 130 mmol/L — ABNORMAL LOW (ref 135–145)
Total Bilirubin: 0.8 mg/dL (ref 0.0–1.2)
Total Protein: 6.8 g/dL (ref 6.5–8.1)

## 2024-06-18 LAB — CBC WITH DIFFERENTIAL/PLATELET
Abs Granulocyte: 12.7 K/uL — ABNORMAL HIGH (ref 1.5–6.5)
Abs Immature Granulocytes: 0.27 K/uL — ABNORMAL HIGH (ref 0.00–0.07)
Basophils Absolute: 0.1 K/uL (ref 0.0–0.1)
Basophils Relative: 0 %
Eosinophils Absolute: 0 K/uL (ref 0.0–0.5)
Eosinophils Relative: 0 %
HCT: 36.2 % (ref 36.0–46.0)
Hemoglobin: 12 g/dL (ref 12.0–15.0)
Immature Granulocytes: 2 %
Lymphocytes Relative: 6 %
Lymphs Abs: 0.8 K/uL (ref 0.7–4.0)
MCH: 30.3 pg (ref 26.0–34.0)
MCHC: 33.1 g/dL (ref 30.0–36.0)
MCV: 91.4 fL (ref 80.0–100.0)
Monocytes Absolute: 1 K/uL (ref 0.1–1.0)
Monocytes Relative: 7 %
Neutro Abs: 12.7 K/uL — ABNORMAL HIGH (ref 1.7–7.7)
Neutrophils Relative %: 85 %
Platelets: 159 K/uL (ref 150–400)
RBC: 3.96 MIL/uL (ref 3.87–5.11)
RDW: 14.1 % (ref 11.5–15.5)
Smear Review: NORMAL
WBC: 14.8 K/uL — ABNORMAL HIGH (ref 4.0–10.5)
nRBC: 0 % (ref 0.0–0.2)

## 2024-06-18 LAB — URINALYSIS, W/ REFLEX TO CULTURE (INFECTION SUSPECTED)
Bilirubin Urine: NEGATIVE
Glucose, UA: NEGATIVE mg/dL
Ketones, ur: NEGATIVE mg/dL
Nitrite: NEGATIVE
Protein, ur: >=300 mg/dL — AB
Specific Gravity, Urine: 1.015 (ref 1.005–1.030)
WBC, UA: >50 WBC/hpf (ref 0–5)
pH: 5 (ref 5.0–8.0)

## 2024-06-18 LAB — RESP PANEL BY RT-PCR (RSV, FLU A&B, COVID)  RVPGX2
Influenza A by PCR: NEGATIVE
Influenza B by PCR: NEGATIVE
Resp Syncytial Virus by PCR: NEGATIVE
SARS Coronavirus 2 by RT PCR: NEGATIVE

## 2024-06-18 LAB — GLUCOSE, CAPILLARY
Glucose-Capillary: 265 mg/dL — ABNORMAL HIGH (ref 70–99)
Glucose-Capillary: 255 mg/dL — ABNORMAL HIGH (ref 70–99)
Glucose-Capillary: 263 mg/dL — ABNORMAL HIGH (ref 70–99)
Glucose-Capillary: 240 mg/dL — ABNORMAL HIGH (ref 70–99)
Glucose-Capillary: 191 mg/dL — ABNORMAL HIGH (ref 70–99)
Glucose-Capillary: 184 mg/dL — ABNORMAL HIGH (ref 70–99)

## 2024-06-18 LAB — PROTIME-INR
INR: 1.3 — ABNORMAL HIGH (ref 0.8–1.2)
Prothrombin Time: 17.2 s — ABNORMAL HIGH (ref 11.4–15.2)

## 2024-06-18 LAB — MRSA NEXT GEN BY PCR, NASAL: MRSA by PCR Next Gen: NOT DETECTED

## 2024-06-18 LAB — LACTIC ACID, PLASMA
Lactic Acid, Venous: 5.9 mmol/L (ref 0.5–1.9)
Lactic Acid, Venous: 4.6 mmol/L (ref 0.5–1.9)

## 2024-06-18 SURGERY — CYSTOSCOPY, WITH STENT INSERTION
Anesthesia: General | Laterality: Left

## 2024-06-18 MED ORDER — PHENYLEPHRINE 80 MCG/ML (10ML) SYRINGE FOR IV PUSH (FOR BLOOD PRESSURE SUPPORT)
PREFILLED_SYRINGE | INTRAVENOUS | Status: AC
  Filled 2024-06-18: qty 10

## 2024-06-18 MED ORDER — PROPOFOL 10 MG/ML IV BOLUS
INTRAVENOUS | Status: DC | PRN
  Administered 2024-06-18 (×2): 120 mg via INTRAVENOUS

## 2024-06-18 MED ORDER — LACTATED RINGERS IV BOLUS (SEPSIS)
1000.0000 mL | Freq: Once | INTRAVENOUS | Status: AC
  Administered 2024-06-18 (×2): 1000 mL via INTRAVENOUS

## 2024-06-18 MED ORDER — FENTANYL CITRATE PF 50 MCG/ML IJ SOSY: 25.0000 ug | PREFILLED_SYRINGE | INTRAMUSCULAR | Status: DC | PRN

## 2024-06-18 MED ORDER — INSULIN ASPART 100 UNIT/ML IJ SOLN
INTRAMUSCULAR | Status: AC
  Filled 2024-06-18: qty 1

## 2024-06-18 MED ORDER — PROCHLORPERAZINE EDISYLATE 10 MG/2ML IJ SOLN
5.0000 mg | Freq: Four times a day (QID) | INTRAMUSCULAR | Status: DC | PRN
  Administered 2024-06-18 – 2024-06-22 (×3): 5 mg via INTRAVENOUS
  Filled 2024-06-18 (×2): qty 2

## 2024-06-18 MED ORDER — BUPROPION HCL ER (XL) 150 MG PO TB24
150.0000 mg | ORAL_TABLET | Freq: Every morning | ORAL | Status: DC
  Administered 2024-06-18 – 2024-06-28 (×14): 150 mg via ORAL
  Filled 2024-06-18 (×11): qty 1

## 2024-06-18 MED ORDER — ASPIRIN 81 MG PO TBEC
81.0000 mg | DELAYED_RELEASE_TABLET | Freq: Every day | ORAL | Status: DC
  Administered 2024-06-19 – 2024-06-28 (×12): 81 mg via ORAL
  Filled 2024-06-18 (×10): qty 1

## 2024-06-18 MED ORDER — ACETAMINOPHEN 10 MG/ML IV SOLN
1000.0000 mg | Freq: Four times a day (QID) | INTRAVENOUS | Status: DC
  Administered 2024-06-18 (×2): 1000 mg via INTRAVENOUS
  Filled 2024-06-18: qty 100

## 2024-06-18 MED ORDER — LIDOCAINE HCL (PF) 2 % IJ SOLN
INTRAMUSCULAR | Status: DC | PRN
Start: 2024-06-18 — End: 2024-06-18
  Administered 2024-06-18 (×2): 100 mg via INTRADERMAL

## 2024-06-18 MED ORDER — LACTATED RINGERS IV SOLN: INTRAVENOUS | Status: DC

## 2024-06-18 MED ORDER — OXYCODONE HCL 5 MG/5ML PO SOLN: 5.0000 mg | Freq: Once | ORAL | Status: DC | PRN

## 2024-06-18 MED ORDER — SODIUM CHLORIDE 0.9 % IV SOLN: INTRAVENOUS | Status: AC

## 2024-06-18 MED ORDER — INSULIN ASPART 100 UNIT/ML IJ SOLN
0.0000 [IU] | INTRAMUSCULAR | Status: DC
  Administered 2024-06-18: 4 [IU] via SUBCUTANEOUS
  Administered 2024-06-18: 11 [IU] via SUBCUTANEOUS
  Administered 2024-06-18: 4 [IU] via SUBCUTANEOUS
  Administered 2024-06-18 (×2): 7 [IU] via SUBCUTANEOUS
  Administered 2024-06-18: 11 [IU] via SUBCUTANEOUS
  Administered 2024-06-19: 7 [IU] via SUBCUTANEOUS
  Administered 2024-06-19 (×7): 4 [IU] via SUBCUTANEOUS
  Administered 2024-06-19: 7 [IU] via SUBCUTANEOUS
  Administered 2024-06-19: 4 [IU] via SUBCUTANEOUS
  Filled 2024-06-18: qty 0.2

## 2024-06-18 MED ORDER — OXYCODONE HCL 5 MG PO TABS: 5.0000 mg | ORAL_TABLET | Freq: Once | ORAL | Status: DC | PRN

## 2024-06-18 MED ORDER — TAMSULOSIN HCL 0.4 MG PO CAPS
0.4000 mg | ORAL_CAPSULE | Freq: Every day | ORAL | Status: DC
  Administered 2024-06-18 – 2024-06-27 (×13): 0.4 mg via ORAL
  Filled 2024-06-18 (×10): qty 1

## 2024-06-18 MED ORDER — LEVOTHYROXINE SODIUM 75 MCG PO TABS
75.0000 ug | ORAL_TABLET | Freq: Every day | ORAL | Status: DC
  Administered 2024-06-19 – 2024-06-28 (×12): 75 ug via ORAL
  Filled 2024-06-18 (×10): qty 1

## 2024-06-18 MED ORDER — DEXAMETHASONE SODIUM PHOSPHATE 10 MG/ML IJ SOLN
INTRAMUSCULAR | Status: DC | PRN
Start: 2024-06-18 — End: 2024-06-18
  Administered 2024-06-18 (×2): 5 mg via INTRAVENOUS

## 2024-06-18 MED ORDER — CHLORHEXIDINE GLUCONATE 0.12 % MT SOLN
15.0000 mL | Freq: Once | OROMUCOSAL | Status: AC
  Administered 2024-06-18 (×2): 15 mL via OROMUCOSAL

## 2024-06-18 MED ORDER — PANTOPRAZOLE SODIUM 40 MG PO TBEC
40.0000 mg | DELAYED_RELEASE_TABLET | Freq: Every day | ORAL | Status: DC
  Administered 2024-06-18 – 2024-06-28 (×14): 40 mg via ORAL
  Filled 2024-06-18 (×11): qty 1

## 2024-06-18 MED ORDER — LIDOCAINE HCL (PF) 2 % IJ SOLN
INTRAMUSCULAR | Status: AC
  Filled 2024-06-18: qty 5

## 2024-06-18 MED ORDER — PROPOFOL 1000 MG/100ML IV EMUL
INTRAVENOUS | Status: AC
  Filled 2024-06-18: qty 100

## 2024-06-18 MED ORDER — FENTANYL CITRATE (PF) 100 MCG/2ML IJ SOLN
INTRAMUSCULAR | Status: DC | PRN
  Administered 2024-06-18 (×2): 50 ug via INTRAVENOUS

## 2024-06-18 MED ORDER — INSULIN ASPART 100 UNIT/ML IJ SOLN
0.0000 [IU] | INTRAMUSCULAR | Status: AC | PRN
  Administered 2024-06-18 (×4): 4 [IU] via SUBCUTANEOUS
  Filled 2024-06-18: qty 1

## 2024-06-18 MED ORDER — ONDANSETRON HCL 4 MG/2ML IJ SOLN
INTRAMUSCULAR | Status: DC | PRN
Start: 2024-06-18 — End: 2024-06-18
  Administered 2024-06-18 (×2): 4 mg via INTRAVENOUS

## 2024-06-18 MED ORDER — HYDROMORPHONE HCL 1 MG/ML IJ SOLN
0.5000 mg | INTRAMUSCULAR | Status: DC | PRN
  Administered 2024-06-26 – 2024-06-27 (×3): 0.5 mg via INTRAVENOUS
  Filled 2024-06-18 (×6): qty 0.5

## 2024-06-18 MED ORDER — ACETAMINOPHEN 10 MG/ML IV SOLN
1000.0000 mg | Freq: Once | INTRAVENOUS | Status: DC | PRN
Start: 2024-06-18 — End: 2024-06-18

## 2024-06-18 MED ORDER — EPHEDRINE SULFATE-NACL 50-0.9 MG/10ML-% IV SOSY
PREFILLED_SYRINGE | INTRAVENOUS | Status: DC | PRN
Start: 2024-06-18 — End: 2024-06-18
  Administered 2024-06-18 (×2): 10 mg via INTRAVENOUS

## 2024-06-18 MED ORDER — GABAPENTIN 100 MG PO CAPS
100.0000 mg | ORAL_CAPSULE | Freq: Every day | ORAL | Status: DC
  Administered 2024-06-25: 100 mg via ORAL
  Filled 2024-06-18 (×5): qty 1

## 2024-06-18 MED ORDER — DEXAMETHASONE SODIUM PHOSPHATE 10 MG/ML IJ SOLN
INTRAMUSCULAR | Status: AC
  Filled 2024-06-18: qty 1

## 2024-06-18 MED ORDER — PHENYLEPHRINE HCL-NACL 20-0.9 MG/250ML-% IV SOLN
INTRAVENOUS | Status: AC
  Filled 2024-06-18: qty 250

## 2024-06-18 MED ORDER — SODIUM CHLORIDE 0.9 % IV SOLN
2.0000 g | INTRAVENOUS | Status: DC
  Administered 2024-06-19 (×2): 2 g via INTRAVENOUS
  Filled 2024-06-18 (×2): qty 20

## 2024-06-18 MED ORDER — AMLODIPINE BESYLATE 5 MG PO TABS
2.5000 mg | ORAL_TABLET | Freq: Every day | ORAL | Status: DC
  Administered 2024-06-18 – 2024-06-20 (×6): 2.5 mg via ORAL
  Filled 2024-06-18 (×3): qty 1

## 2024-06-18 MED ORDER — ONDANSETRON HCL 4 MG/2ML IJ SOLN
INTRAMUSCULAR | Status: AC
Start: 2024-06-18 — End: 2024-06-18
  Filled 2024-06-18: qty 2

## 2024-06-18 MED ORDER — FENTANYL CITRATE (PF) 100 MCG/2ML IJ SOLN
INTRAMUSCULAR | Status: AC
  Filled 2024-06-18: qty 2

## 2024-06-18 MED ORDER — SUCCINYLCHOLINE CHLORIDE 200 MG/10ML IV SOSY
PREFILLED_SYRINGE | INTRAVENOUS | Status: AC
Start: 2024-06-18 — End: 2024-06-18
  Filled 2024-06-18: qty 10

## 2024-06-18 MED ORDER — ROSUVASTATIN CALCIUM 10 MG PO TABS
10.0000 mg | ORAL_TABLET | Freq: Every day | ORAL | Status: DC
  Administered 2024-06-19 – 2024-06-28 (×12): 10 mg via ORAL
  Filled 2024-06-18 (×10): qty 1

## 2024-06-18 MED ORDER — PHENYLEPHRINE 80 MCG/ML (10ML) SYRINGE FOR IV PUSH (FOR BLOOD PRESSURE SUPPORT)
PREFILLED_SYRINGE | INTRAVENOUS | Status: DC | PRN
Start: 2024-06-18 — End: 2024-06-18
  Administered 2024-06-18 (×2): 240 ug via INTRAVENOUS
  Administered 2024-06-18 (×2): 160 ug via INTRAVENOUS
  Administered 2024-06-18 (×2): 240 ug via INTRAVENOUS
  Administered 2024-06-18: 160 ug via INTRAVENOUS
  Administered 2024-06-18: 240 ug via INTRAVENOUS
  Administered 2024-06-18: 160 ug via INTRAVENOUS
  Administered 2024-06-18: 240 ug via INTRAVENOUS

## 2024-06-18 MED ORDER — POLYETHYLENE GLYCOL 3350 17 G PO PACK: 17.0000 g | PACK | Freq: Every day | ORAL | Status: DC | PRN

## 2024-06-18 MED ORDER — SERTRALINE HCL 100 MG PO TABS
100.0000 mg | ORAL_TABLET | Freq: Every day | ORAL | Status: DC
  Administered 2024-06-18 – 2024-06-28 (×14): 100 mg via ORAL
  Filled 2024-06-18 (×12): qty 1

## 2024-06-18 MED ORDER — PHENYLEPHRINE HCL-NACL 20-0.9 MG/250ML-% IV SOLN
INTRAVENOUS | Status: DC | PRN
  Administered 2024-06-18 (×2): 50 ug/min via INTRAVENOUS

## 2024-06-18 MED ORDER — EPHEDRINE SULFATE (PRESSORS) 50 MG/ML IJ SOLN
INTRAMUSCULAR | Status: AC
Start: 2024-06-18 — End: 2024-06-18
  Filled 2024-06-18: qty 1

## 2024-06-18 MED ORDER — SODIUM CHLORIDE 0.9 % IV SOLN
2.0000 g | Freq: Once | INTRAVENOUS | Status: AC
  Administered 2024-06-18 (×2): 2 g via INTRAVENOUS
  Filled 2024-06-18: qty 20

## 2024-06-18 MED ORDER — CHLORHEXIDINE GLUCONATE CLOTH 2 % EX PADS
6.0000 | MEDICATED_PAD | Freq: Every day | CUTANEOUS | Status: DC
  Administered 2024-06-18 – 2024-06-28 (×14): 6 via TOPICAL

## 2024-06-18 MED ORDER — ONDANSETRON HCL 4 MG/2ML IJ SOLN: 4.0000 mg | Freq: Once | INTRAMUSCULAR | Status: DC | PRN

## 2024-06-18 MED ORDER — ACETAMINOPHEN 10 MG/ML IV SOLN: 1000.0000 mg | Freq: Once | INTRAVENOUS | Status: AC

## 2024-06-18 MED ORDER — OXYCODONE HCL 5 MG PO TABS: 5.0000 mg | ORAL_TABLET | Freq: Four times a day (QID) | ORAL | Status: DC | PRN

## 2024-06-18 MED ORDER — PROPOFOL 500 MG/50ML IV EMUL
INTRAVENOUS | Status: DC | PRN
  Administered 2024-06-18 (×2): 15 ug/kg/min via INTRAVENOUS

## 2024-06-18 MED ORDER — ORAL CARE MOUTH RINSE
15.0000 mL | OROMUCOSAL | Status: DC | PRN
Start: 2024-06-18 — End: 2024-06-28

## 2024-06-18 MED ORDER — PROPOFOL 10 MG/ML IV BOLUS
INTRAVENOUS | Status: AC
  Filled 2024-06-18: qty 20

## 2024-06-18 MED ORDER — STERILE WATER FOR IRRIGATION IR SOLN
Status: DC | PRN
  Administered 2024-06-18 (×2): 3000 mL

## 2024-06-18 SURGICAL SUPPLY — 12 items
BAG URO CATCHER STRL LF (MISCELLANEOUS) ×1 IMPLANT
CATH URETL OPEN END 6FR 70 (CATHETERS) IMPLANT
CLOTH BEACON ORANGE TIMEOUT ST (SAFETY) ×1 IMPLANT
GLOVE SURG LX STRL 7.5 STRW (GLOVE) ×1 IMPLANT
GOWN STRL REUS W/ TWL XL LVL3 (GOWN DISPOSABLE) ×1 IMPLANT
GUIDEWIRE STR DUAL SENSOR (WIRE) ×1 IMPLANT
GUIDEWIRE ZIPWRE .038 STRAIGHT (WIRE) IMPLANT
KIT TURNOVER KIT A (KITS) ×1 IMPLANT
MANIFOLD NEPTUNE II (INSTRUMENTS) ×1 IMPLANT
PACK CYSTO (CUSTOM PROCEDURE TRAY) ×1 IMPLANT
STENT URET 6FRX24 CONTOUR (STENTS) IMPLANT
TUBING CONNECTING 10 (TUBING) ×1 IMPLANT

## 2024-06-18 NOTE — Progress Notes (Signed)
 CRNA at bedside.

## 2024-06-18 NOTE — ED Notes (Signed)
 EDP made aware of lactic acid 5.9.

## 2024-06-18 NOTE — ED Notes (Signed)
 [facility] called for transport.

## 2024-06-18 NOTE — ED Notes (Signed)
 Pt is High Fall Risk. Yellow armband placed on patient. Nonskid socks/patient shoes on. Fall Risk magnet placed on pt room door. Pt given call light and instructed to call for assistance before getting up. Pt/visitor verbalized understanding.

## 2024-06-18 NOTE — ED Triage Notes (Signed)
 EDP at bedside

## 2024-06-18 NOTE — Inpatient Diabetes Management (Signed)
 Inpatient Diabetes Program Recommendations  AACE/ADA: New Consensus Statement on Inpatient Glycemic Control (2015)  Target Ranges:  Prepandial:   less than 140 mg/dL      Peak postprandial:   less than 180 mg/dL (1-2 hours)      Critically ill patients:  140 - 180 mg/dL   Lab Results  Component Value Date   GLUCAP 263 (H) 06/18/2024   HGBA1C 8.6 (H) 04/15/2017    Review of Glycemic Control  Diabetes history: DM2 Outpatient Diabetes medications: Novolin 70/30 10 units in am, 15 units at lunch if CBG > 110, 20 units with dinner meal Current orders for Inpatient glycemic control: Novolog  0-20 units Q4H  HgbA1C - 8.6% CBGs today: 265, 255, 263 mg/dL  Inpatient Diabetes Program Recommendations:    Consider adding Semglee  10 units BID  Will continue to follow.  Thank you. Shona Brandy, RD, LDN, CDCES Inpatient Diabetes Coordinator [phone]

## 2024-06-18 NOTE — ED Provider Notes (Signed)
 Care of patient received from prior provider at 6:47 AM, please see their note for complete H/P and care plan.  Received handoff per ED course.  Clinical Course as of 06/18/24 0647  Mon Jun 18, 2024  0501 Urology paged [CC]    Clinical Course User Index [CC] Jerral Meth, MD    Reassessment: Urosepsis.  Urology consulted due to infected stone.  Planning for stenting.  Already covered for antibiotics.  Treating fever here.  Medicine paged for admission following procedure.     Jerral Meth, MD 06/18/24 [phone]

## 2024-06-18 NOTE — Anesthesia Procedure Notes (Signed)
 Procedure Name: LMA Insertion Date/Time: 06/18/2024 7:15 AM  Performed by: Carleton Garnette SAUNDERS, CRNAPre-anesthesia Checklist: Patient identified, Emergency Drugs available, Suction available, Patient being monitored and Timeout performed Patient Re-evaluated:Patient Re-evaluated prior to induction Oxygen Delivery Method: Circle system utilized Preoxygenation: Pre-oxygenation with 100% oxygen Induction Type: IV induction LMA: LMA inserted LMA Size: 4.0 Tube type: Oral Number of attempts: 1 Placement Confirmation: positive ETCO2 and breath sounds checked- equal and bilateral Tube secured with: Tape Dental Injury: Teeth and Oropharynx as per pre-operative assessment

## 2024-06-18 NOTE — ED Notes (Signed)
 [facility] at bedside

## 2024-06-18 NOTE — Anesthesia Postprocedure Evaluation (Signed)
 Anesthesia Post Note  Patient: Sue Davidson  Procedure(s) Performed: CYSTOSCOPY, WITH STENT INSERTION (Left)     Patient location during evaluation: PACU Anesthesia Type: General Level of consciousness: awake and alert Pain management: pain level controlled Vital Signs Assessment: post-procedure vital signs reviewed and stable Respiratory status: spontaneous breathing, nonlabored ventilation, respiratory function stable and patient connected to nasal cannula oxygen Cardiovascular status: blood pressure returned to baseline and stable Postop Assessment: no apparent nausea or vomiting Anesthetic complications: no   No notable events documented.  Last Vitals:  Vitals:   06/18/24 0830 06/18/24 0845  BP: (!) 125/53 (!) 125/55  Pulse: 82 80  Resp: 15 12  Temp:    SpO2: 97% 97%    Last Pain:  Vitals:   06/18/24 0830  TempSrc:   PainSc: 0-No pain                 [name]

## 2024-06-18 NOTE — Op Note (Signed)
 Preoperative diagnosis:  Left ureteral calculus and sepsis  Postoperative diagnosis:  Left ureteral calculus and sepsis  Procedure:  Cystoscopy Left ureteral stent placement (6 x 24-no string)   Surgeon: Gretel CANDIE Ferrara, Mickey. M.D.  Anesthesia: General  Complications: None  EBL: Minimal  Specimens: Urine culture  Disposition of specimen: Microbiology lab  Indication: Sue Davidson is a 73 y.o. patient with a 6 mm proximal left ureteral calculus and sepsis. After reviewing the management options for treatment, she elected to proceed with the above surgical procedure(s). We have discussed the potential benefits and risks of the procedure, side effects of the proposed treatment, the likelihood of the patient achieving the goals of the procedure, and any potential problems that might occur during the procedure or recuperation. Informed consent has been obtained.  Description of procedure:  The patient was taken to the operating room and general anesthesia was induced.  The patient was placed in the dorsal lithotomy position, prepped and draped in the usual sterile fashion, and preoperative antibiotics were administered. A preoperative time-out was performed.   Cystourethroscopy was performed.  The patient's urethra was examined and was normal. The bladder was then systematically examined in its entirety. There was no evidence for any bladder tumors, stones, or other mucosal pathology.   A urine culture was obtained and sent.  A 0.38 sensor guidewire was then advanced up the left ureter into the renal pelvis under fluoroscopic guidance.  The wire was then backloaded through the cystoscope and a ureteral stent was advance over the wire using Seldinger technique.  The stent was positioned appropriately under fluoroscopic and cystoscopic guidance.  The wire was then removed with an adequate stent curl noted in the renal pelvis as well as in the bladder.  The bladder was then emptied and  the procedure ended.  The patient appeared to tolerate the procedure well and without complications.  The patient was able to be awakened and transferred to the recovery unit in satisfactory condition.    Gretel CANDIE Ferrara Teddie MD

## 2024-06-18 NOTE — H&P (Signed)
 History and Physical    Patient: Sue Davidson FMW:[id number] DOB: 1951-11-06 DOA: 06/18/2024 DOS: the patient was seen and examined on 06/18/2024 PCP: Clarice Nottingham, MD  Patient coming from: Home  Chief Complaint:  Chief Complaint  Patient presents with   Code Sepsis   HPI: Sue Davidson is a 73 y.o. female with medical history significant of anemia, angina, anxiety, depression, bilateral carotid arterial disease, carpal tunnel syndrome, chronic cholecystitis, type 2 diabetes, class II obesity, GERD, grade 1 diastolic dysfunction, heart murmur, pulmonary artery hypertension hypertension, hyperlipidemia, MRSA abdominal wound, scoliosis of spine, lumbar disc disease, sleep apnea, vitamin D deficiency, history of urolithiasis who went to the emergency department on 06/15/2024 due to left flank pain, diagnosed with left ureterolithiasis with mild left hydronephrosis secondary to 7 mm calculus in proximal left ureter discharged home on tamsulosin , which she did not take.  She had multiple falls at home after having generalized weakness despite her left flank pain improving, but no fever, chills, dyspnea, nausea or emesis.  However, the patient was febrile after arriving to the emergency department at [facility]. and at 1 point became hypotensive with blood pressure reading going as low as 71/46 mmHg.  Sepsis protocol ordered and urology consulted. She denied rhinorrhea, sore throat, wheezing or hemoptysis.  No chest pain, palpitations, diaphoresis, PND, orthopnea or pitting edema of the lower extremities.  No abdominal pain, nausea, emesis, diarrhea, constipation, melena or hematochezia.  No polyuria, polydipsia, polyphagia or blurred vision.   Lab work: Urinalysis showed large hemoglobin, moderate leukocyte esterase, greater than 300 mg/dL protein, 88-79 RBC, greater than 50 WBC and many bacteria.  Coronavirus, influenza and RSV PCR test was negative.  CBC showed a white count of 14.8 with  85% neutrophils, hemoglobin 12.0 g/dL and platelets 840.  PT was 17.2 and INR 1.3.  Lactic acid was 5.9 then 4.6 mmol/L.  CMP showed sodium 130 (corrected 135), potassium 3.5, chloride 88 CO2 21 mmol/L, glucose 302, BUN 44 and creatinine 3.61 mg/dL (18 months ago creatinine was 0.84 mg/dL), calcium  normalizes after correction; total protein 6.8 and albumin 3.2 g/dL.  AST 209 and ALT 49 units/L, normal alkaline phosphatase and total bilirubin.  Imaging: CT renal stone study showed a stable proximal left ureteral stone similar to what was seen on the prior exam with mild hydronephrosis seen.  There was no new focal abnormality. Portable 1 view chest radiograph with no acute abnormality.   ED course: Initial vital signs were temperature 101.9 F, pulse 110, respiration 24, BP 125/56 mmHg O2 sat 92% on nasal cannula oxygen at 3 LPM.  The patient received ceftriaxone  2 g IVPB, LR 2000 mL bolus and regular insulin  4 units SQ.  While in the OR she received dexamethasone  5 mg IVP, ephedrine  10 mg IVP, fentanyl  50 mcg IVP, lidocaine  100 mg intradermal, propofol  120 mg followed by infusion, Neo-Synephrine 160 mcg IVP x 2 and 240 mcg IVP x 3.  Review of Systems: As mentioned in the history of present illness. All other systems reviewed and are negative. Past Medical History:  Diagnosis Date   Anemia    hx of   Anginal pain (HCC)    hx of   Anxiety    Depression    Diabetes mellitus without complication (HCC)    GERD (gastroesophageal reflux disease)    Headache(784.0)    Heart murmur    Hyperlipidemia    Hypertension    MRSA (methicillin resistant staph aureus) culture positive  many years ago-abdominal wound- no issuses since.   PONV (postoperative nausea and vomiting)    severe   Sleep apnea    Study done -remains under evaluation- no cpap yet.   Vitamin D deficiency    Past Surgical History:  Procedure Laterality Date   ABDOMINAL HYSTERECTOMY  1970's   APPENDECTOMY  1970's   BREAST BIOPSY  Left 2018   CARDIAC CATHETERIZATION     CHOLECYSTECTOMY N/A 10/02/2013   Procedure: LAPAROSCOPIC CHOLECYSTECTOMY;  Surgeon: Krystal CHRISTELLA Spinner, MD;  Location: [facility];  Service: General;  Laterality: N/A;   cyst removed Left    wristganglion   ELBOW SURGERY Left    HARDWARE REMOVAL Left    ankle   KNEE ARTHROSCOPY Right 06/30/2016   Procedure: ARTHROSCOPY RIGHT KNEE WITH MEDIAL AND LATERAL MENSICAL DEBRIDEMENT;  Surgeon: Dempsey Moan, MD;  Location: [facility];  Service: Orthopedics;  Laterality: Right;  LMA   LEG SURGERY Left 1980's   broke leg and ankle   SHOULDER ARTHROSCOPY Bilateral    TONSILLECTOMY  1970's   TUBAL LIGATION  1970's   Social History:  reports that she has never smoked. She has never used smokeless tobacco. She reports that she does not drink alcohol and does not use drugs.  Allergies  Allergen Reactions   Codeine Nausea And Vomiting   Pioglitazone     Other Reaction(s): Unknown   Semaglutide Nausea And Vomiting   Tape Rash    Family History  Problem Relation Age of Onset   Diabetes Mother    Heart disease Mother        CABG age 85s   Cancer Father        throat and lung   Heart disease Father    Cancer Paternal Aunt        breast   Breast cancer Paternal Aunt    Cancer Paternal Aunt        colon    Prior to Admission medications   Medication Sig Start Date End Date Taking? Authorizing Provider  acetaminophen  (TYLENOL ) 500 MG tablet Take 500 mg by mouth every 6 (six) hours as needed for moderate pain.   Yes [provider]  aspirin  81 MG tablet Take 81 mg by mouth daily.   Yes [provider]  insulin  NPH-regular Human (NOVOLIN 70/30) (70-30) 100 UNIT/ML injection Inject into the skin 2 (two) times daily with a meal.   Yes [provider]  amLODipine  (NORVASC ) 2.5 MG tablet Take 1 tablet (2.5 mg total) by mouth daily. 07/28/23 07/27/24  Croitoru, Mihai, MD  buPROPion  (WELLBUTRIN  XL) 150 MG 24 hr tablet Take 1 tablet by mouth every  morning.    [provider]  cholecalciferol (VITAMIN D) 1000 units tablet Take 1,000 Units by mouth daily.    [provider]  Continuous Glucose Sensor (FREESTYLE LIBRE 14 DAY SENSOR) MISC USE 1 SENSOR EVERY TWO WEEKS for 59    [provider]  furosemide  (LASIX ) 80 MG tablet TAKE 1 TABLET BY MOUTH EVERY DAY 10/30/19   Meng, Hao, PA  gabapentin  (NEURONTIN ) 100 MG capsule Take 1 capsule (100 mg total) by mouth at bedtime. 03/30/24   Williams, Megan E, NP  glucose blood (ONETOUCH ULTRA TEST) test strip 1 each as needed.    [provider]  Insulin  Pen Needle (BD PEN NEEDLE NANO 2ND GEN) 32G X 4 MM MISC as directed subcutaneous Once a day for 30 days 05/04/22   [provider]  morphine  (MSIR) 15  MG tablet Take 0.5 tablets (7.5 mg total) by mouth every 4 (four) hours as needed for severe pain (pain score 7-10). 06/15/24   Emil Share, DO  olmesartan  (BENICAR ) 40 MG tablet TAKE 1 TABLET BY MOUTH EVERY DAY 11/19/19   Croitoru, Mihai, MD  omeprazole (PRILOSEC) 40 MG capsule Take 40 mg by mouth daily. Reported on 01/19/2016 07/18/13   [provider]  ondansetron  (ZOFRAN ) 8 MG tablet Take 8 mg by mouth 2 (two) times daily as needed for nausea or vomiting.    [provider]  ondansetron  (ZOFRAN -ODT) 4 MG disintegrating tablet 4mg  ODT q4 hours prn nausea/vomit 06/15/24   Floyd, Dan, DO  rosuvastatin  (CRESTOR ) 20 MG tablet Take 20 mg by mouth daily with breakfast.     [provider]  sertraline  (ZOLOFT ) 100 MG tablet Take 100 mg by mouth daily.    [provider]  tamsulosin  (FLOMAX ) 0.4 MG CAPS capsule Take 1 capsule (0.4 mg total) by mouth daily after supper. 06/15/24   Emil Share, DO    Physical Exam: Vitals:   06/18/24 0330 06/18/24 0345 06/18/24 0400 06/18/24 0415  BP: (!) 136/52 (!) 138/54 (!) 140/55 (!) 134/59  Pulse:  92 91 89  Resp: (!) 22 20 19  (!) 22  Temp:      TempSrc:      SpO2:  97% 99% 98%  Weight:      Height:        Physical Exam Vitals and nursing note reviewed.  Constitutional:      General: She is awake. She is not in acute distress.    Appearance: Normal appearance. She is ill-appearing.  HENT:     Head: Normocephalic.     Nose: No rhinorrhea.     Mouth/Throat:     Mouth: Mucous membranes are moist.  Eyes:     General: No scleral icterus.    Pupils: Pupils are equal, round, and reactive to light.  Neck:     Vascular: No JVD.  Cardiovascular:     Rate and Rhythm: Normal rate and regular rhythm.     Heart sounds: S1 normal and S2 normal.  Pulmonary:     Effort: Pulmonary effort is normal.     Breath sounds: Normal breath sounds. No wheezing or rales.  Abdominal:     General: Bowel sounds are normal. There is no distension.     Palpations: Abdomen is soft.     Tenderness: There is no right CVA tenderness or left CVA tenderness.  Musculoskeletal:     Cervical back: Neck supple.     Right lower leg: No edema.     Left lower leg: No edema.  Skin:    General: Skin is warm and dry.  Neurological:     General: No focal deficit present.     Mental Status: She is alert.  Psychiatric:        Mood and Affect: Mood normal.        Behavior: Behavior normal. Behavior is cooperative.     Data Reviewed:  Results are pending, will review when available. 08/10/2023 echocardiogram IMPRESSIONS:   1. Left ventricular ejection fraction, by estimation, is 60 to 65%. The  left ventricle has normal function. The left ventricle has no regional  wall motion abnormalities. There is mild left ventricular hypertrophy.  Left ventricular diastolic parameters  are consistent with Grade I diastolic dysfunction (impaired relaxation).   2. Right ventricular systolic function is normal. The right ventricular  size is normal.  There is normal pulmonary artery systolic pressure. The  estimated right ventricular systolic pressure is 29.0 mmHg.   3. The mitral valve is degenerative. Trivial mitral valve  regurgitation.  No evidence of mitral stenosis.   4. The aortic valve is tricuspid. There is mild calcification of the  aortic valve. Aortic valve regurgitation is not visualized. No aortic  stenosis is present.   5. The inferior vena cava is normal in size with greater than 50%  respiratory variability, suggesting right atrial pressure of 3 mmHg.   EKG: Vent. rate 100 BPM PR interval 162 ms QRS duration 129 ms QT/QTcB 377/487 ms P-R-T axes 25 -57 108 Sinus tachycardia LVH with IVCD, LAD and secondary repol abnrm  Assessment and Plan: Principal Problem:   Sepsis (HCC) Due to:   Acute UTI (urinary tract infection) In the setting of:   Urolithiasis Complicated by:   Hydronephrosis of left kidney Admit to SDU/inpatient. Continue IV fluids. Analgesics as needed. Antiemetics as needed. Continue ceftriaxone  2 g IVPB. Follow-up urine culture and sensitivity. Follow-up blood culture and sensitivity Follow CBC and CMP in a.m.  Active Problems:   AKI (acute kidney injury) (HCC) Continue IV fluids. Hold ARB/ACE. Hold diuretic. Avoid hypotension. Avoid nephrotoxins. Monitor intake and output. Monitor renal function electrolytes.    Depression  Continue bupropion  150 mg p.o. daily. Continue sertraline  100 mg p.o. daily.    Hypothyroidism  Continue levothyroxine  75 mcg p.o. daily.    OSA (obstructive sleep apnea) Does not use CPAP at home. Still working to obtain Emogene. Declined to use a hospital CPAP device.    Essential hypertension Continue amlodipine  2.5 mg p.o. daily.    Dyslipidemia Continue rosuvastatin  adjusted for kidney function.    Gastroesophageal reflux disease Continue home PPI or formulary equivalent.    Class 2 obesity Current BMI 38.39 kg/m. Would benefit from lifestyle modifications. Follow-up closely with PCP.     Advance Care Planning:   Code Status: Full Code   Consults:   Family Communication:   Severity of Illness: The  appropriate patient status for this patient is INPATIENT. Inpatient status is judged to be reasonable and necessary in order to provide the required intensity of service to ensure the patient's safety. The patient's presenting symptoms, physical exam findings, and initial radiographic and laboratory data in the context of their chronic comorbidities is felt to place them at high risk for further clinical deterioration. Furthermore, it is not anticipated that the patient will be medically stable for discharge from the hospital within 2 midnights of admission.   * I certify that at the point of admission it is my clinical judgment that the patient will require inpatient hospital care spanning beyond 2 midnights from the point of admission due to high intensity of service, high risk for further deterioration and high frequency of surveillance required.*  Author: Alm Dorn Castor, MD 06/18/2024 7:49 AM  For on call review [email or web address].   This document was prepared using Dragon voice recognition software and may contain some unintended transcription errors.

## 2024-06-18 NOTE — ED Provider Notes (Addendum)
 [facility] Provider Note   CSN: [id number] Arrival date & time: 06/18/24  0147     Patient presents with: Weakness  Sue Davidson is a 73 y.o. female.   The history is provided by the patient.   She has history of hypertension, diabetes, hyperlipidemia and was in the emergency department 2 days ago and diagnosed with a left-sided kidney stone.  She comes in tonight because she was very weak and could not stand.  She states that her flank pain is still present but significantly improved.  She denies fever, chills, sweats.  She denies nausea or vomiting.    Prior to Admission medications   Medication Sig Start Date End Date Taking? Authorizing Provider  acetaminophen  (TYLENOL ) 500 MG tablet Take 500 mg by mouth every 6 (six) hours as needed for moderate pain.    [provider]  amLODipine  (NORVASC ) 2.5 MG tablet Take 1 tablet (2.5 mg total) by mouth daily. 07/28/23 07/27/24  Croitoru, Mihai, MD  aspirin  81 MG tablet Take 81 mg by mouth daily.    [provider]  buPROPion  (WELLBUTRIN  XL) 150 MG 24 hr tablet Take 1 tablet by mouth every morning.    [provider]  cholecalciferol (VITAMIN D) 1000 units tablet Take 1,000 Units by mouth daily.    [provider]  Continuous Glucose Sensor (FREESTYLE LIBRE 14 DAY SENSOR) MISC USE 1 SENSOR EVERY TWO WEEKS for 107    [provider]  furosemide  (LASIX ) 80 MG tablet TAKE 1 TABLET BY MOUTH EVERY DAY 10/30/19   Meng, Hao, PA  gabapentin  (NEURONTIN ) 100 MG capsule Take 1 capsule (100 mg total) by mouth at bedtime. 03/30/24   Williams, Megan E, NP  glucose blood (ONETOUCH ULTRA TEST) test strip 1 each as needed.    [provider]  insulin  NPH-regular Human (NOVOLIN 70/30) (70-30) 100 UNIT/ML injection Inject into the skin 2 (two) times daily with a meal.    [provider]  Insulin  Pen Needle (BD PEN NEEDLE NANO 2ND GEN) 32G X 4 MM MISC  as directed subcutaneous Once a day for 30 days 05/04/22   [provider]  morphine  (MSIR) 15 MG tablet Take 0.5 tablets (7.5 mg total) by mouth every 4 (four) hours as needed for severe pain (pain score 7-10). 06/15/24   Emil Share, DO  olmesartan  (BENICAR ) 40 MG tablet TAKE 1 TABLET BY MOUTH EVERY DAY 11/19/19   Croitoru, Mihai, MD  omeprazole (PRILOSEC) 40 MG capsule Take 40 mg by mouth daily. Reported on 01/19/2016 07/18/13   [provider]  ondansetron  (ZOFRAN ) 8 MG tablet Take 8 mg by mouth 2 (two) times daily as needed for nausea or vomiting.    [provider]  ondansetron  (ZOFRAN -ODT) 4 MG disintegrating tablet 4mg  ODT q4 hours prn nausea/vomit 06/15/24   Floyd, Dan, DO  rosuvastatin  (CRESTOR ) 20 MG tablet Take 20 mg by mouth daily with breakfast.     [provider]  sertraline  (ZOLOFT ) 100 MG tablet Take 100 mg by mouth daily.    [provider]  tamsulosin  (FLOMAX ) 0.4 MG CAPS capsule Take 1 capsule (0.4 mg total) by mouth daily after supper. 06/15/24   Emil Share, DO    Allergies: Pioglitazone, Semaglutide, and Tape    Review of Systems  All other systems reviewed and are negative.   Updated Vital Signs Pulse (!) 110   Temp (!) 101.9 F (38.8 C) (Oral)   SpO2 92%  Physical Exam Vitals and nursing note reviewed.   73 year old female, resting comfortably and in no acute distress. Vital signs are significant for elevated temperature and heart rate. Oxygen saturation is 92%, which is normal. Head is normocephalic and atraumatic. PERRLA, EOMI. Neck is nontender and supple. Back is nontender and there is no CVA tenderness. Lungs are clear without rales, wheezes, or rhonchi. Chest is nontender. Heart has regular rate and rhythm without murmur. Abdomen is soft, flat, nontender. Extremities have no cyanosis or edema. Skin is warm and dry without rash. Neurologic: Awake and alert, moves all extremities equally.  (all labs ordered are  listed, but only abnormal results are displayed) Labs Reviewed  LACTIC ACID, PLASMA - Abnormal; Notable for the following components:      Result Value   Lactic Acid, Venous 5.9 (*)    All other components within normal limits  COMPREHENSIVE METABOLIC PANEL WITH GFR - Abnormal; Notable for the following components:   Sodium 130 (*)    Chloride 88 (*)    CO2 21 (*)    Glucose, Bld 302 (*)    BUN 44 (*)    Creatinine, Ser 3.61 (*)    Calcium  8.7 (*)    Albumin 3.2 (*)    AST 209 (*)    ALT 49 (*)    GFR, Estimated 13 (*)    Anion gap 21 (*)    All other components within normal limits  CBC WITH DIFFERENTIAL/PLATELET - Abnormal; Notable for the following components:   WBC 14.8 (*)    Neutro Abs 12.7 (*)    Abs Immature Granulocytes 0.27 (*)    Abs Granulocyte 12.7 (*)    All other components within normal limits  PROTIME-INR - Abnormal; Notable for the following components:   Prothrombin Time 17.2 (*)    INR 1.3 (*)    All other components within normal limits  RESP PANEL BY RT-PCR (RSV, FLU A&B, COVID)  RVPGX2  CULTURE, BLOOD (ROUTINE X 2)  CULTURE, BLOOD (ROUTINE X 2)  LACTIC ACID, PLASMA  URINALYSIS, W/ REFLEX TO CULTURE (INFECTION SUSPECTED)    EKG: EKG Interpretation Date/Time:  Monday June 18 2024 02:23:17 EDT Ventricular Rate:  100 PR Interval:  162 QRS Duration:  129 QT Interval:  377 QTC Calculation: 487 R Axis:   -57  Text Interpretation: Sinus tachycardia LVH with IVCD, LAD and secondary repol abnrm When compared with ECG of 07/20/2023, HEART RATE has increased Confirmed by Raford Lenis ([id number]) on 06/18/2024 2:39:57 AM  Radiology: CT Renal Stone Study Result Date: 06/18/2024 CLINICAL DATA:  Flank pain EXAM: CT ABDOMEN AND PELVIS WITHOUT CONTRAST TECHNIQUE: Multidetector CT imaging of the abdomen and pelvis was performed following the standard protocol without IV contrast. RADIATION DOSE REDUCTION: This exam was performed according to the departmental  dose-optimization program which includes automated exposure control, adjustment of the mA and/or kV according to patient size and/or use of iterative reconstruction technique. COMPARISON:  06/15/2024 FINDINGS: Lower chest: Mild left basilar atelectasis is noted. Hepatobiliary: No focal liver abnormality is seen. Status post cholecystectomy. No biliary dilatation. Pancreas: Unremarkable. No pancreatic ductal dilatation or surrounding inflammatory changes. Spleen: Normal in size without focal abnormality. Adrenals/Urinary Tract: Adrenal glands are within normal limits. Right kidney shows no calculi or obstructive changes. Left kidney demonstrates proximal ureteral stone stable in appearance from the prior exam measuring 6 mm. Mild perinephric stranding is seen. No left renal calculi are noted. The bladder is within normal limits. Stomach/Bowel: No obstructive or  inflammatory changes of the colon are noted. The appendix is not visualized consistent with a prior surgical history. Small bowel and stomach are unremarkable. Vascular/Lymphatic: Aortic atherosclerosis. No enlarged abdominal or pelvic lymph nodes. Reproductive: Status post hysterectomy. No adnexal masses. Other: No abdominal wall hernia or abnormality. No abdominopelvic ascites. Musculoskeletal: No acute or significant osseous findings. IMPRESSION: Stable proximal left ureteral stone similar to that seen on the prior exam. Mild hydronephrosis is noted. No new focal abnormality is seen. Electronically Signed   By: Oneil Devonshire M.D.   On: 06/18/2024 02:54     Procedures  Cardiac monitor shows sinus tachycardia, per my interpretation. Medications Ordered in the ED  lactated ringers  infusion (has no administration in time range)  lactated ringers  bolus 1,000 mL (1,000 mLs Intravenous New Bag/Given 06/18/24 0215)    And  lactated ringers  bolus 1,000 mL (has no administration in time range)    And  lactated ringers  bolus 1,000 mL (has no administration  in time range)  cefTRIAXone  (ROCEPHIN ) 2 g in sodium chloride  0.9 % 100 mL IVPB (0 g Intravenous Stopped 06/18/24 0258)                                    Medical Decision Making Amount and/or Complexity of Data Reviewed Labs: ordered. Radiology: ordered.  Risk Prescription drug management.   Fever inpatient with known kidney stone with obstructive uropathy.  This is a presentation with a wide range of treatment options and carries with it a high risk of morbidity and complications.  This is a closed space infection which needs to be treated very aggressively.  I have reviewed her past records and note ED visit on 06/15/2024 at which time CT scan showed 7 mm calculus in the proximal left ureter with mild left hydronephrosis.  With fever and tachycardia and known source of infection, she meets criteria for sepsis and I have initiated code sepsis is and initiated antibiotics.  I have discussed case with Dr. Jesusa of urology service who requests that we get a repeat CT scan to see if the stone has moved and to transfer her to [facility] emergency department where he can see her and arrange for emergent stent placement.  I have discussed the case with Dr. Jerral who is the ED physician it was a long hospital emergency department who agrees to accept the patient in transfer.  CT scan shows stable proximal left ureteral calculus with mild hydronephrosis.  Have independently viewed the images, and agree with the radiologist's interpretation.  I have reviewed her chest x-ray and my interpretation is cardiomegaly with no acute findings-specifically, no pneumonia.  Radiologist interpretation is pending.  I have reviewed her laboratory tests, and my interpretation is moderate leukocytosis with left shift consistent with sepsis, acute kidney injury with creatinine 3.61 with last creatinine normal on 12/13/2022, elevated transaminase levels AST greater than ALT, mild hyponatremia which is not felt to be  clinically significant, markedly elevated lactic acid level consistent with sepsis.  I have already ordered early goal-directed fluids for sepsis.  She is being transferred emergently to [facility] emergency department for emergent stent placement.  CRITICAL CARE Performed by: Alm Lias Total critical care time: 90 minutes Critical care time was exclusive of separately billable procedures and treating other patients. Critical care was necessary to treat or prevent imminent or life-threatening deterioration. Critical care was time spent personally by me  on the following activities: development of treatment plan with patient and/or surrogate as well as nursing, discussions with consultants, evaluation of patient's response to treatment, examination of patient, obtaining history from patient or surrogate, ordering and performing treatments and interventions, ordering and review of laboratory studies, ordering and review of radiographic studies, pulse oximetry and re-evaluation of patient's condition.     Final diagnoses:  Sepsis due to undetermined organism [facility])  Obstructive uropathy  Ureterolithiasis  Acute kidney injury (nontraumatic) (HCC)  Hyponatremia  Elevated transaminase level    ED Discharge Orders     None          Raford Lenis, MD 06/18/24 [id number]    Raford Lenis, MD 06/18/24 SHERLEEN    Raford Lenis, MD 06/18/24 [phone]

## 2024-06-18 NOTE — ED Notes (Signed)
 Verbal consent to transfer was completed during work up, daughters at bedside as witness.

## 2024-06-18 NOTE — ED Triage Notes (Addendum)
 Pt BIB [facility] per pt dx with kidney stone on Friday. Has not been taking her Flomax . Per EMS Pt family reports concern for dehydration. Pt has also had multiple falls in the last couple of days requiring EMS assistance. Triage deferred at this time due to patient condition.

## 2024-06-18 NOTE — Consult Note (Signed)
 Urology Consult   Physician requesting consult: Dr. Raford  Reason for consult: Infected obstructing stone  History of Present Illness: Sue Davidson is a 73 y.o. hx of HTN, T2DM, HLD presenting with weakness and malaise, presented 3 days ago with flank pain found to have a 6mm left proximal ureteral stone with mild hydronephrosis.  She was febrile and tachycardic on initial presentation requiring 3L [place], heart rate has responded to fluid resuscitation. Labs notable for, UA pending, leukocytosis to 14.8, Cr 3.61 from ~1 baseline, initial lactate 5.9, down to 4.6 with fluid resuscitation and imaging redemonstrating stone. Urine and blood cultures pending  She denies a history of voiding or storage urinary symptoms, hematuria, UTIs, STDs, urolithiasis, GU malignancy/trauma/surgery.  Past Medical History:  Diagnosis Date   Anemia    hx of   Anginal pain (HCC)    hx of   Anxiety    Depression    Diabetes mellitus without complication (HCC)    GERD (gastroesophageal reflux disease)    Headache(784.0)    Heart murmur    Hyperlipidemia    Hypertension    MRSA (methicillin resistant staph aureus) culture positive    many years ago-abdominal wound- no issuses since.   PONV (postoperative nausea and vomiting)    severe   Sleep apnea    Study done -remains under evaluation- no cpap yet.   Vitamin D deficiency     Past Surgical History:  Procedure Laterality Date   ABDOMINAL HYSTERECTOMY  1970's   APPENDECTOMY  1970's   BREAST BIOPSY Left 2018   CARDIAC CATHETERIZATION     CHOLECYSTECTOMY N/A 10/02/2013   Procedure: LAPAROSCOPIC CHOLECYSTECTOMY;  Surgeon: Krystal CHRISTELLA Spinner, MD;  Location: [facility];  Service: General;  Laterality: N/A;   cyst removed Left    wristganglion   ELBOW SURGERY Left    HARDWARE REMOVAL Left    ankle   KNEE ARTHROSCOPY Right 06/30/2016   Procedure: ARTHROSCOPY RIGHT KNEE WITH MEDIAL AND LATERAL MENSICAL DEBRIDEMENT;  Surgeon: Dempsey Moan, MD;  Location: [facility]  [facility];  Service: Orthopedics;  Laterality: Right;  LMA   LEG SURGERY Left 1980's   broke leg and ankle   SHOULDER ARTHROSCOPY Bilateral    TONSILLECTOMY  1970's   TUBAL LIGATION  1970's     Current Hospital Medications:  Home meds:  No current facility-administered medications on file prior to encounter.   Current Outpatient Medications on File Prior to Encounter  Medication Sig Dispense Refill   acetaminophen  (TYLENOL ) 500 MG tablet Take 500 mg by mouth every 6 (six) hours as needed for moderate pain.     amLODipine  (NORVASC ) 2.5 MG tablet Take 1 tablet (2.5 mg total) by mouth daily. 90 tablet 3   aspirin  81 MG tablet Take 81 mg by mouth daily.     buPROPion  (WELLBUTRIN  XL) 150 MG 24 hr tablet Take 1 tablet by mouth every morning.     cholecalciferol (VITAMIN D) 1000 units tablet Take 1,000 Units by mouth daily.     Continuous Glucose Sensor (FREESTYLE LIBRE 14 DAY SENSOR) MISC USE 1 SENSOR EVERY TWO WEEKS for 84     furosemide  (LASIX ) 80 MG tablet TAKE 1 TABLET BY MOUTH EVERY DAY 90 tablet 3   gabapentin  (NEURONTIN ) 100 MG capsule Take 1 capsule (100 mg total) by mouth at bedtime. 60 capsule 0   glucose blood (ONETOUCH ULTRA TEST) test strip 1 each as needed.     insulin  NPH-regular Human (NOVOLIN 70/30) (70-30) 100 UNIT/ML injection Inject into  the skin 2 (two) times daily with a meal.     Insulin  Pen Needle (BD PEN NEEDLE NANO 2ND GEN) 32G X 4 MM MISC as directed subcutaneous Once a day for 30 days     morphine  (MSIR) 15 MG tablet Take 0.5 tablets (7.5 mg total) by mouth every 4 (four) hours as needed for severe pain (pain score 7-10). 7 tablet 0   olmesartan  (BENICAR ) 40 MG tablet TAKE 1 TABLET BY MOUTH EVERY DAY 90 tablet 3   omeprazole (PRILOSEC) 40 MG capsule Take 40 mg by mouth daily. Reported on 01/19/2016     ondansetron  (ZOFRAN ) 8 MG tablet Take 8 mg by mouth 2 (two) times daily as needed for nausea or vomiting.     ondansetron  (ZOFRAN -ODT) 4 MG disintegrating tablet 4mg  ODT  q4 hours prn nausea/vomit 20 tablet 0   rosuvastatin  (CRESTOR ) 20 MG tablet Take 20 mg by mouth daily with breakfast.      sertraline  (ZOLOFT ) 100 MG tablet Take 100 mg by mouth daily.     tamsulosin  (FLOMAX ) 0.4 MG CAPS capsule Take 1 capsule (0.4 mg total) by mouth daily after supper. 30 capsule 0     Scheduled Meds: Continuous Infusions:  acetaminophen      lactated ringers      PRN Meds:.  Allergies:  Allergies  Allergen Reactions   Codeine Nausea And Vomiting   Pioglitazone     Other Reaction(s): Unknown   Semaglutide Nausea And Vomiting   Tape Rash    Family History  Problem Relation Age of Onset   Diabetes Mother    Heart disease Mother        CABG age 34s   Cancer Father        throat and lung   Heart disease Father    Cancer Paternal Aunt        breast   Breast cancer Paternal Aunt    Cancer Paternal Aunt        colon    Social History:  reports that she has never smoked. She has never used smokeless tobacco. She reports that she does not drink alcohol and does not use drugs.  ROS: A complete review of systems was performed.  All systems are negative except for pertinent findings as noted.  Physical Exam:  Vital signs in last 24 hours: Temp:  [101.9 F (38.8 C)] 101.9 F (38.8 C) (08/11 0152) Pulse Rate:  [89-110] 89 (08/11 0415) Resp:  [13-24] 22 (08/11 0415) BP: (125-140)/(50-59) 134/59 (08/11 0415) SpO2:  [92 %-100 %] 98 % (08/11 0415) Weight:  [105.1 kg] 105.1 kg (08/11 0254) Constitutional:  Alert and oriented, No acute distress Cardiovascular: Sinus tachycardia, No JVD Respiratory: Normal respiratory effort, Lungs clear bilaterally GI: Abdomen is soft, nontender, nondistended, no abdominal masses GU: No CVA tenderness Lymphatic: No lymphadenopathy Neurologic: Grossly intact, no focal deficits Psychiatric: Normal mood and affect  Laboratory Data:  Recent Labs    06/18/24 0201  WBC 14.8*  HGB 12.0  HCT 36.2  PLT 159    Recent Labs     06/18/24 0201  NA 130*  K 3.5  CL 88*  GLUCOSE 302*  BUN 44*  CALCIUM  8.7*  CREATININE 3.61*     Results for orders placed or performed during the hospital encounter of 06/18/24 (from the past 24 hours)  Lactic acid, plasma     Status: Abnormal   Collection Time: 06/18/24  2:01 AM  Result Value Ref Range   Lactic Acid, Venous 5.9 (HH)  0.5 - 1.9 mmol/L  Comprehensive metabolic panel     Status: Abnormal   Collection Time: 06/18/24  2:01 AM  Result Value Ref Range   Sodium 130 (L) 135 - 145 mmol/L   Potassium 3.5 3.5 - 5.1 mmol/L   Chloride 88 (L) 98 - 111 mmol/L   CO2 21 (L) 22 - 32 mmol/L   Glucose, Bld 302 (H) 70 - 99 mg/dL   BUN 44 (H) 8 - 23 mg/dL   Creatinine, Ser 6.38 (H) 0.44 - 1.00 mg/dL   Calcium  8.7 (L) 8.9 - 10.3 mg/dL   Total Protein 6.8 6.5 - 8.1 g/dL   Albumin 3.2 (L) 3.5 - 5.0 g/dL   AST 790 (H) 15 - 41 U/L   ALT 49 (H) 0 - 44 U/L   Alkaline Phosphatase 81 38 - 126 U/L   Total Bilirubin 0.8 0.0 - 1.2 mg/dL   GFR, Estimated 13 (L) >60 mL/min   Anion gap 21 (H) 5 - 15  CBC with Differential     Status: Abnormal   Collection Time: 06/18/24  2:01 AM  Result Value Ref Range   WBC 14.8 (H) 4.0 - 10.5 K/uL   RBC 3.96 3.87 - 5.11 MIL/uL   Hemoglobin 12.0 12.0 - 15.0 g/dL   HCT 63.7 63.9 - 53.9 %   MCV 91.4 80.0 - 100.0 fL   MCH 30.3 26.0 - 34.0 pg   MCHC 33.1 30.0 - 36.0 g/dL   RDW 85.8 88.4 - 84.4 %   Platelets 159 150 - 400 K/uL   nRBC 0.0 0.0 - 0.2 %   Neutrophils Relative % 85 %   Neutro Abs 12.7 (H) 1.7 - 7.7 K/uL   Lymphocytes Relative 6 %   Lymphs Abs 0.8 0.7 - 4.0 K/uL   Monocytes Relative 7 %   Monocytes Absolute 1.0 0.1 - 1.0 K/uL   Eosinophils Relative 0 %   Eosinophils Absolute 0.0 0.0 - 0.5 K/uL   Basophils Relative 0 %   Basophils Absolute 0.1 0.0 - 0.1 K/uL   WBC Morphology See Note    RBC Morphology MORPHOLOGY UNREMARKABLE    Smear Review Normal platelet morphology    Immature Granulocytes 2 %   Abs Immature Granulocytes 0.27 (H)  0.00 - 0.07 K/uL   Abs Granulocyte 12.7 (H) 1.5 - 6.5 K/uL  Protime-INR     Status: Abnormal   Collection Time: 06/18/24  2:01 AM  Result Value Ref Range   Prothrombin Time 17.2 (H) 11.4 - 15.2 seconds   INR 1.3 (H) 0.8 - 1.2  Resp panel by RT-PCR (RSV, Flu A&B, Covid) Anterior Nasal Swab     Status: None   Collection Time: 06/18/24  3:11 AM   Specimen: Anterior Nasal Swab  Result Value Ref Range   SARS Coronavirus 2 by RT PCR NEGATIVE NEGATIVE   Influenza A by PCR NEGATIVE NEGATIVE   Influenza B by PCR NEGATIVE NEGATIVE   Resp Syncytial Virus by PCR NEGATIVE NEGATIVE  Lactic acid, plasma     Status: Abnormal   Collection Time: 06/18/24  4:05 AM  Result Value Ref Range   Lactic Acid, Venous 4.6 (HH) 0.5 - 1.9 mmol/L   Recent Results (from the past 240 hours)  Resp panel by RT-PCR (RSV, Flu A&B, Covid) Anterior Nasal Swab     Status: None   Collection Time: 06/18/24  3:11 AM   Specimen: Anterior Nasal Swab  Result Value Ref Range Status   SARS Coronavirus 2 by  RT PCR NEGATIVE NEGATIVE Final    Comment: (NOTE) SARS-CoV-2 target nucleic acids are NOT DETECTED.  The SARS-CoV-2 RNA is generally detectable in upper respiratory specimens during the acute phase of infection. The lowest concentration of SARS-CoV-2 viral copies this assay can detect is 138 copies/mL. A negative result does not preclude SARS-Cov-2 infection and should not be used as the sole basis for treatment or other patient management decisions. A negative result may occur with  improper specimen collection/handling, submission of specimen other than nasopharyngeal swab, presence of viral mutation(s) within the areas targeted by this assay, and inadequate number of viral copies(<138 copies/mL). A negative result must be combined with clinical observations, patient history, and epidemiological information. The expected result is Negative.  Fact Sheet for Patients:  BloggerCourse.com  Fact  Sheet for Healthcare Providers:  SeriousBroker.it  This test is no t yet approved or cleared by the [place]  FDA and  has been authorized for detection and/or diagnosis of SARS-CoV-2 by FDA under an Emergency Use Authorization (EUA). This EUA will remain  in effect (meaning this test can be used) for the duration of the COVID-19 declaration under Section 564(b)(1) of the Act, 21 U.S.C.section 360bbb-3(b)(1), unless the authorization is terminated  or revoked sooner.       Influenza A by PCR NEGATIVE NEGATIVE Final   Influenza B by PCR NEGATIVE NEGATIVE Final    Comment: (NOTE) The Xpert Xpress SARS-CoV-2/FLU/RSV plus assay is intended as an aid in the diagnosis of influenza from Nasopharyngeal swab specimens and should not be used as a sole basis for treatment. Nasal washings and aspirates are unacceptable for Xpert Xpress SARS-CoV-2/FLU/RSV testing.  Fact Sheet for Patients: BloggerCourse.com  Fact Sheet for Healthcare Providers: SeriousBroker.it  This test is not yet approved or cleared by the [place]  FDA and has been authorized for detection and/or diagnosis of SARS-CoV-2 by FDA under an Emergency Use Authorization (EUA). This EUA will remain in effect (meaning this test can be used) for the duration of the COVID-19 declaration under Section 564(b)(1) of the Act, 21 U.S.C. section 360bbb-3(b)(1), unless the authorization is terminated or revoked.     Resp Syncytial Virus by PCR NEGATIVE NEGATIVE Final    Comment: (NOTE) Fact Sheet for Patients: BloggerCourse.com  Fact Sheet for Healthcare Providers: SeriousBroker.it  This test is not yet approved or cleared by the [place]  FDA and has been authorized for detection and/or diagnosis of SARS-CoV-2 by FDA under an Emergency Use Authorization (EUA). This EUA will remain in effect  (meaning this test can be used) for the duration of the COVID-19 declaration under Section 564(b)(1) of the Act, 21 U.S.C. section 360bbb-3(b)(1), unless the authorization is terminated or revoked.  Performed at [facility], [address] [place], [place] [address]     Renal Function: Recent Labs    06/18/24 0201  CREATININE 3.61*   Estimated Creatinine Clearance: 16.7 mL/min (A) (by C-G formula based on SCr of 3.61 mg/dL (H)).  Radiologic Imaging: DG Chest Port 1 View Result Date: 06/18/2024 CLINICAL DATA:  Possible sepsis EXAM: PORTABLE CHEST 1 VIEW COMPARISON:  04/13/2017 FINDINGS: Cardiac shadow is prominent but accentuated by the frontal technique. The lungs are clear. No bony abnormality is noted. IMPRESSION: No acute abnormality noted. Electronically Signed   By: Oneil Devonshire M.D.   On: 06/18/2024 03:13   CT Renal Stone Study Result Date: 06/18/2024 CLINICAL DATA:  Flank pain EXAM: CT ABDOMEN AND PELVIS WITHOUT CONTRAST TECHNIQUE: Multidetector CT imaging of  the abdomen and pelvis was performed following the standard protocol without IV contrast. RADIATION DOSE REDUCTION: This exam was performed according to the departmental dose-optimization program which includes automated exposure control, adjustment of the mA and/or kV according to patient size and/or use of iterative reconstruction technique. COMPARISON:  06/15/2024 FINDINGS: Lower chest: Mild left basilar atelectasis is noted. Hepatobiliary: No focal liver abnormality is seen. Status post cholecystectomy. No biliary dilatation. Pancreas: Unremarkable. No pancreatic ductal dilatation or surrounding inflammatory changes. Spleen: Normal in size without focal abnormality. Adrenals/Urinary Tract: Adrenal glands are within normal limits. Right kidney shows no calculi or obstructive changes. Left kidney demonstrates proximal ureteral stone stable in appearance from the prior exam measuring 6 mm. Mild perinephric stranding  is seen. No left renal calculi are noted. The bladder is within normal limits. Stomach/Bowel: No obstructive or inflammatory changes of the colon are noted. The appendix is not visualized consistent with a prior surgical history. Small bowel and stomach are unremarkable. Vascular/Lymphatic: Aortic atherosclerosis. No enlarged abdominal or pelvic lymph nodes. Reproductive: Status post hysterectomy. No adnexal masses. Other: No abdominal wall hernia or abnormality. No abdominopelvic ascites. Musculoskeletal: No acute or significant osseous findings. IMPRESSION: Stable proximal left ureteral stone similar to that seen on the prior exam. Mild hydronephrosis is noted. No new focal abnormality is seen. Electronically Signed   By: Oneil Devonshire M.D.   On: 06/18/2024 02:54    I independently reviewed the above imaging studies.  Impression/Recommendation: 73yo presenting with concern for sepsis secondary to a urinary source with an obstructing left proximal ureteral stone. While there is no urine yet collected, patient is clearly septic with a known stone and would benefit from emergent stent placement to decompress her left collecting system. We discussed the risks, benefits, and alternatives and the patient and family are aware and agreeable to proceed -Plan for emergent left ureteral stent placement -Please keep patient NPO -Please continue to attempt to obtain a UA -Patient will need admission to medicine team   Jacqulyn CHRISTELLA Bound 06/18/2024, 5:45 AM

## 2024-06-18 NOTE — Sepsis Progress Note (Signed)
 Elink following for sepsis protocol.

## 2024-06-18 NOTE — Anesthesia Preprocedure Evaluation (Signed)
 Anesthesia Evaluation  Patient identified by MRN, date of birth, ID band Patient awake    Reviewed: Allergy & Precautions, NPO status , Patient's Chart, lab work & pertinent test results  History of Anesthesia Complications (+) PONV and history of anesthetic complications  Airway Mallampati: II  TM Distance: >3 FB Neck ROM: Full    Dental  (+) Dental Advisory Given, Edentulous Upper, Edentulous Lower   Pulmonary shortness of breath, sleep apnea , neg COPD, Patient abstained from smoking.Not current smoker   Pulmonary exam normal breath sounds clear to auscultation       Cardiovascular Exercise Tolerance: Good METShypertension, Pt. on medications (-) CAD and (-) Past MI Normal cardiovascular exam(-) dysrhythmias  Rhythm:Regular Rate:Normal - Systolic murmurs TTE 2024: 1. Left ventricular ejection fraction, by estimation, is 60 to 65%. The  left ventricle has normal function. The left ventricle has no regional  wall motion abnormalities. There is mild left ventricular hypertrophy.  Left ventricular diastolic parameters  are consistent with Grade I diastolic dysfunction (impaired relaxation).   2. Right ventricular systolic function is normal. The right ventricular  size is normal. There is normal pulmonary artery systolic pressure. The  estimated right ventricular systolic pressure is 29.0 mmHg.   3. The mitral valve is degenerative. Trivial mitral valve regurgitation.  No evidence of mitral stenosis.   4. The aortic valve is tricuspid. There is mild calcification of the  aortic valve. Aortic valve regurgitation is not visualized. No aortic  stenosis is present.   5. The inferior vena cava is normal in size with greater than 50%  respiratory variability, suggesting right atrial pressure of 3 mmHg.     Neuro/Psych  Headaches PSYCHIATRIC DISORDERS Anxiety Depression       GI/Hepatic Neg liver ROS,GERD  ,,  Endo/Other   diabetes, Insulin  Dependent  Class 3 obesityGLP1 last taken  7 days ago. Denies GI symptoms for the past two days  Renal/GU ARFRenal disease  negative genitourinary   Musculoskeletal negative musculoskeletal ROS (+)    Abdominal  (+) + obese  Peds negative pediatric ROS (+)  Hematology negative hematology ROS (+)   Anesthesia Other Findings Past Medical History: No date: Anemia     Comment:  hx of No date: Anginal pain (HCC)     Comment:  hx of No date: Anxiety No date: Depression No date: Diabetes mellitus without complication (HCC) No date: GERD (gastroesophageal reflux disease) No date: Headache(784.0) No date: Heart murmur No date: Hyperlipidemia No date: Hypertension No date: MRSA (methicillin resistant staph aureus) culture positive     Comment:  many years ago-abdominal wound- no issuses since. No date: PONV (postoperative nausea and vomiting)     Comment:  severe No date: Sleep apnea     Comment:  Study done -remains under evaluation- no cpap yet. No date: Vitamin D deficiency  Reproductive/Obstetrics negative OB ROS                              Anesthesia Physical Anesthesia Plan  ASA: 3  Anesthesia Plan: General   Post-op Pain Management:    Induction: Intravenous  PONV Risk Score and Plan: 4 or greater and Ondansetron , Dexamethasone , Treatment may vary due to age or medical condition, TIVA and Propofol  infusion  Airway Management Planned: LMA  Additional Equipment: None  Intra-op Plan:   Post-operative Plan: Extubation in OR  Informed Consent: I have reviewed the patients History and Physical, chart, labs and  discussed the procedure including the risks, benefits and alternatives for the proposed anesthesia with the patient or authorized representative who has indicated his/her understanding and acceptance.     Dental advisory given  Plan Discussed with: CRNA  Anesthesia Plan Comments: (Last vomiting/nausea was 3  days prior. NPO appropriate. GLP1 held for 7 days. Reasonable for LMA with GETA backup.  Discussed risks of anesthesia with patient, including PONV, sore throat, lip/dental/eye damage. Rare risks discussed as well, such as cardiorespiratory and neurological sequelae, and allergic reactions. Discussed the role of CRNA in patient's perioperative care. Patient understands.)         Anesthesia Quick Evaluation

## 2024-06-18 NOTE — Transfer of Care (Signed)
 Immediate Anesthesia Transfer of Care Note  Patient: Sue Davidson  Procedure(s) Performed: CYSTOSCOPY, WITH STENT INSERTION (Left)  Patient Location: PACU  Anesthesia Type:General  Level of Consciousness: sedated  Airway & Oxygen Therapy: Patient Spontanous Breathing and Patient connected to face mask oxygen  Post-op Assessment: Patient BP lower treated with Phenylephrine  and ephedrine , placed on phenylephrine  gtt 50 mcg/;min  Post vital signs: Reviewed and stable  Last Vitals:  Vitals Value Taken Time  BP 115/48 06/18/24 07:55  Temp 36.9 C 06/18/24 07:39  Pulse 80 06/18/24 07:55  Resp 11 06/18/24 07:55  SpO2 100 % 06/18/24 07:55    Last Pain:  Vitals:   06/18/24 0739  TempSrc:   PainSc: Asleep      Patients Stated Pain Goal: 0 (06/18/24 0704)  Complications: No notable events documented.

## 2024-06-19 ENCOUNTER — Encounter (HOSPITAL_COMMUNITY): Payer: Self-pay | Admitting: Urology

## 2024-06-19 DIAGNOSIS — A419 Sepsis, unspecified organism: Secondary | ICD-10-CM | POA: Diagnosis not present

## 2024-06-19 DIAGNOSIS — N179 Acute kidney failure, unspecified: Secondary | ICD-10-CM | POA: Diagnosis not present

## 2024-06-19 DIAGNOSIS — R6520 Severe sepsis without septic shock: Secondary | ICD-10-CM | POA: Diagnosis not present

## 2024-06-19 LAB — BLOOD CULTURE ID PANEL (REFLEXED) - BCID2

## 2024-06-19 LAB — GLUCOSE, CAPILLARY
Glucose-Capillary: 199 mg/dL — ABNORMAL HIGH (ref 70–99)
Glucose-Capillary: 189 mg/dL — ABNORMAL HIGH (ref 70–99)
Glucose-Capillary: 191 mg/dL — ABNORMAL HIGH (ref 70–99)
Glucose-Capillary: 237 mg/dL — ABNORMAL HIGH (ref 70–99)
Glucose-Capillary: 200 mg/dL — ABNORMAL HIGH (ref 70–99)

## 2024-06-19 LAB — CBC
HCT: 35.6 % — ABNORMAL LOW (ref 36.0–46.0)
Hemoglobin: 11.3 g/dL — ABNORMAL LOW (ref 12.0–15.0)
MCH: 30 pg (ref 26.0–34.0)
MCHC: 31.7 g/dL (ref 30.0–36.0)
MCV: 94.4 fL (ref 80.0–100.0)
Platelets: 123 K/uL — ABNORMAL LOW (ref 150–400)
RBC: 3.77 MIL/uL — ABNORMAL LOW (ref 3.87–5.11)
RDW: 14.1 % (ref 11.5–15.5)
WBC: 13.2 K/uL — ABNORMAL HIGH (ref 4.0–10.5)
nRBC: 0 % (ref 0.0–0.2)

## 2024-06-19 LAB — COMPREHENSIVE METABOLIC PANEL WITH GFR
ALT: 73 U/L — ABNORMAL HIGH (ref 0–44)
AST: 261 U/L — ABNORMAL HIGH (ref 15–41)
Albumin: 2.2 g/dL — ABNORMAL LOW (ref 3.5–5.0)
Alkaline Phosphatase: 64 U/L (ref 38–126)
Anion gap: 13 (ref 5–15)
BUN: 61 mg/dL — ABNORMAL HIGH (ref 8–23)
CO2: 23 mmol/L (ref 22–32)
Calcium: 8 mg/dL — ABNORMAL LOW (ref 8.9–10.3)
Chloride: 96 mmol/L — ABNORMAL LOW (ref 98–111)
Creatinine, Ser: 4.03 mg/dL — ABNORMAL HIGH (ref 0.44–1.00)
GFR, Estimated: 11 mL/min — ABNORMAL LOW (ref >60)
Glucose, Bld: 208 mg/dL — ABNORMAL HIGH (ref 70–99)
Potassium: 3.4 mmol/L — ABNORMAL LOW (ref 3.5–5.1)
Sodium: 132 mmol/L — ABNORMAL LOW (ref 135–145)
Total Bilirubin: 0.4 mg/dL (ref 0.0–1.2)
Total Protein: 6 g/dL — ABNORMAL LOW (ref 6.5–8.1)

## 2024-06-19 LAB — HEMOGLOBIN A1C
Hgb A1c MFr Bld: 8 % — ABNORMAL HIGH (ref 4.8–5.6)
Mean Plasma Glucose: 183 mg/dL

## 2024-06-19 LAB — MAGNESIUM: Magnesium: 2 mg/dL (ref 1.7–2.4)

## 2024-06-19 LAB — PHOSPHORUS: Phosphorus: 4.6 mg/dL (ref 2.5–4.6)

## 2024-06-19 MED ORDER — INSULIN ASPART 100 UNIT/ML IJ SOLN
0.0000 [IU] | Freq: Three times a day (TID) | INTRAMUSCULAR | Status: DC
  Administered 2024-06-20: 7 [IU] via SUBCUTANEOUS
  Administered 2024-06-20 (×4): 4 [IU] via SUBCUTANEOUS
  Administered 2024-06-20: 7 [IU] via SUBCUTANEOUS
  Administered 2024-06-21 – 2024-06-23 (×5): 3 [IU] via SUBCUTANEOUS
  Administered 2024-06-26: 4 [IU] via SUBCUTANEOUS
  Administered 2024-06-27: 3 [IU] via SUBCUTANEOUS
  Administered 2024-06-27: 4 [IU] via SUBCUTANEOUS
  Administered 2024-06-27: 3 [IU] via SUBCUTANEOUS
  Administered 2024-06-28: 4 [IU] via SUBCUTANEOUS
  Administered 2024-06-28: 3 [IU] via SUBCUTANEOUS

## 2024-06-19 MED ORDER — SODIUM CHLORIDE 0.9 % IV SOLN: INTRAVENOUS | Status: AC

## 2024-06-19 MED ORDER — INSULIN ASPART 100 UNIT/ML IJ SOLN: 0.0000 [IU] | Freq: Every day | INTRAMUSCULAR | Status: DC

## 2024-06-19 MED ORDER — POTASSIUM CHLORIDE CRYS ER 20 MEQ PO TBCR
40.0000 meq | EXTENDED_RELEASE_TABLET | Freq: Once | ORAL | Status: AC
  Administered 2024-06-19 (×2): 40 meq via ORAL
  Filled 2024-06-19: qty 2

## 2024-06-19 NOTE — Plan of Care (Signed)

## 2024-06-19 NOTE — Hospital Course (Addendum)
 73 y.o. female with medical history significant of anemia,  anxiety, depression, bilateral carotid arterial disease, type 2 diabetes, class II obesity, GERD, grade 1 diastolic dysfunction,  pulmonary artery hypertension, hyperlipidemia,  sleep apnea,  history of urolithiasis presented to the ED with left flank pain.  In the ED patient was febrile tachycardic tachypneic, hypotensive and required supplemental oxygen.  Urinalysis showed abnormal urinalysis with more than 50 white cells.  COVID influenza and RSV was negative.  CBC showed elevated WBC at 14.8 with 85% neutrophils.  Lactic acid was initially elevated at 5.9 followed by 4.6.  CMP showed hyponatremia with sodium of 130 and creatinine elevation at 3.6 from baseline 0.8.  AST and ALT mildly elevated.  CT renal study showed left hydroureteronephrosis with 7 mm calculus in the proximal left ureter.  She had multiple falls at home after having generalized weakness.    Sepsis protocol ordered and urology was consulted.  Patient was given IV Rocephin  and vasopressors initially.  Patient was then seen by urology and underwent cystoscopy with left ureteral stent placement on 8/11.  8/16.  Creatinine continues to rise at 6.38.  Nephrology stopped IV fluid hydration. 8/17.  Creatinine up at 6.67 today.  Patient still making urine.  No nausea or vomiting but poor appetite.

## 2024-06-19 NOTE — Progress Notes (Signed)
 PHARMACY - PHYSICIAN COMMUNICATION CRITICAL VALUE ALERT - BLOOD CULTURE IDENTIFICATION (BCID)  Sue Davidson is an 73 y.o. female who presented to [facility] on 06/18/2024 with sepsis due to acute UTI  Assessment:  BCID + E. Coli (no ESBL detected) in 2/4 bottles   Name of physician (or Provider) Contacted: Lynwood Kipper, FNP  Current antibiotics: Ceftriaxone  2g IV q24h  Changes to prescribed antibiotics recommended:  Patient is on recommended antibiotics - No changes needed  Results for orders placed or performed during the hospital encounter of 06/18/24  Blood Culture ID Panel (Reflexed) (Collected: 06/18/2024  2:10 AM)  Result Value Ref Range   Enterococcus faecalis NOT DETECTED NOT DETECTED   Enterococcus Faecium NOT DETECTED NOT DETECTED   Listeria monocytogenes NOT DETECTED NOT DETECTED   Staphylococcus species NOT DETECTED NOT DETECTED   Staphylococcus aureus (BCID) NOT DETECTED NOT DETECTED   Staphylococcus epidermidis NOT DETECTED NOT DETECTED   Staphylococcus lugdunensis NOT DETECTED NOT DETECTED   Streptococcus species NOT DETECTED NOT DETECTED   Streptococcus agalactiae NOT DETECTED NOT DETECTED   Streptococcus pneumoniae NOT DETECTED NOT DETECTED   Streptococcus pyogenes NOT DETECTED NOT DETECTED   A.calcoaceticus-baumannii NOT DETECTED NOT DETECTED   Bacteroides fragilis NOT DETECTED NOT DETECTED   Enterobacterales DETECTED (A) NOT DETECTED   Enterobacter cloacae complex NOT DETECTED NOT DETECTED   Escherichia coli DETECTED (A) NOT DETECTED   Klebsiella aerogenes NOT DETECTED NOT DETECTED   Klebsiella oxytoca NOT DETECTED NOT DETECTED   Klebsiella pneumoniae NOT DETECTED NOT DETECTED   Proteus species NOT DETECTED NOT DETECTED   Salmonella species NOT DETECTED NOT DETECTED   Serratia marcescens NOT DETECTED NOT DETECTED   Haemophilus influenzae NOT DETECTED NOT DETECTED   Neisseria meningitidis NOT DETECTED NOT DETECTED   Pseudomonas aeruginosa NOT DETECTED  NOT DETECTED   Stenotrophomonas maltophilia NOT DETECTED NOT DETECTED   Candida albicans NOT DETECTED NOT DETECTED   Candida auris NOT DETECTED NOT DETECTED   Candida glabrata NOT DETECTED NOT DETECTED   Candida krusei NOT DETECTED NOT DETECTED   Candida parapsilosis NOT DETECTED NOT DETECTED   Candida tropicalis NOT DETECTED NOT DETECTED   Cryptococcus neoformans/gattii NOT DETECTED NOT DETECTED   CTX-M ESBL NOT DETECTED NOT DETECTED   Carbapenem resistance IMP NOT DETECTED NOT DETECTED   Carbapenem resistance KPC NOT DETECTED NOT DETECTED   Carbapenem resistance NDM NOT DETECTED NOT DETECTED   Carbapenem resist OXA 48 LIKE NOT DETECTED NOT DETECTED   Carbapenem resistance VIM NOT DETECTED NOT DETECTED    Kemp Arvin Fletcher, PharmD 06/19/2024  12:26 AM

## 2024-06-19 NOTE — Plan of Care (Signed)
  Problem: Education: Goal: Knowledge of General Education information will improve Description: Including pain rating scale, medication(s)/side effects and non-pharmacologic comfort measures Outcome: Progressing   Problem: Health Behavior/Discharge Planning: Goal: Ability to manage health-related needs will improve Outcome: Progressing   Problem: Clinical Measurements: Goal: Diagnostic test results will improve Outcome: Progressing   Problem: Nutrition: Goal: Adequate nutrition will be maintained Outcome: Progressing   Problem: Elimination: Goal: Will not experience complications related to urinary retention Outcome: Progressing   Problem: Pain Managment: Goal: General experience of comfort will improve and/or be controlled Outcome: Progressing   Problem: Fluid Volume: Goal: Ability to maintain a balanced intake and output will improve Outcome: Progressing

## 2024-06-19 NOTE — Progress Notes (Signed)
 PROGRESS NOTE  Sue Davidson FMW:[id number] DOB: 27-Jun-1951 DOA: 06/18/2024 PCP: Clarice Nottingham, MD   LOS: 1 day   Brief narrative:  Sue Davidson is a 73 y.o. female with medical history significant of anemia,  anxiety, depression, bilateral carotid arterial disease, type 2 diabetes, class II obesity, GERD, grade 1 diastolic dysfunction,  pulmonary artery hypertension, hyperlipidemia,  sleep apnea,  history of urolithiasis presented to the ED with left flank pain.  In the ED patient was febrile tachycardic tachypneic, hypotensive and required supplemental oxygen.  Urinalysis showed abnormal urinalysis with more than 50 white cells.  COVID influenza and RSV was negative.  CBC showed elevated WBC at 14.8 with 85% neutrophils.  Lactic acid was initially elevated at 5.9 followed by 4.6.  CMP showed hyponatremia with sodium of 130 and creatinine elevation at 3.6 from baseline 0.8.  AST and ALT mildly elevated.  CT renal study showed left hydroureteronephrosis with 7 mm calculus in the proximal left ureter.  She had multiple falls at home after having generalized weakness.    Sepsis protocol ordered and urology was consulted.  Patient was given IV Rocephin  and vasopressors initially.  Patient was then seen by urology and underwent cystoscopy with left ureteral stent placement.  Patient was then admitted hospital for further evaluation and treatment.    Assessment/Plan: Principal Problem:   Sepsis (HCC) Active Problems:   OSA (obstructive sleep apnea)   Essential hypertension   Dyslipidemia   AKI (acute kidney injury) (HCC)   Gastroesophageal reflux disease   Class 2 obesity   Acute UTI (urinary tract infection)   Urolithiasis   Hydronephrosis of left kidney   Depression   Hypothyroidism  Sepsis secondary to  Acute UTI (urinary tract infection) E. coli bacteremia.  Urolithiasis with  Hydronephrosis of left kidney Seen by urology and patient underwent cystoscopy with left ureteral stent  placement on 06/18/2024.  Continue IV fluids, antiemetics and analgesics.  Continue Rocephin .  Blood cultures now showing E. coli.  Follow urine cultures.  Leukocytosis today at 13.2 from initial 14.8.  Temperature max of 98.5 F.  Follow urology recommendation.    AKI (acute kidney injury)  Likely multifactorial in a combination of hypotension, use of medications, sepsis, poor oral intake and obstructive pathology since she was not eating and drinking for several days prior to presentation.   Continue IV fluids.  Hold ARB and ACE inhibitors, diuretics.  Continue to monitor intake and output charting ,Daily weights.  Creatinine today at 4.0 from initial 3.6.  Continue IV hydration.  If not improved might need nephrology evaluation.  Patient is positive balance for 4768 mL with total urinary output of 650 mL in the last 24 hours.  Urinalysis done yesterday showed more than 300 protein with large hemoglobin and RBC with WBC.  Hypokalemia.  Potassium of 3.4 today.  Will replace orally.  Levels in AM.     Depression  Continue bupropion  and sertraline .     Hypothyroidism  Continue Synthroid .     OSA (obstructive sleep apnea) Still working to obtain Emogene. Declined to use a hospital CPAP device.  Currently on supplemental oxygen by nasal cannula.     Essential hypertension Continue amlodipine .  Hold olmesartan      Dyslipidemia Continue Crestor      Gastroesophageal reflux disease Continue PPI     Class 2 obesity Current BMI 38.39 kg/m.  Would benefit from lifestyle modification.  DVT prophylaxis: SCDs Start: 06/18/24 9375   Disposition: Likely home, in 2 to 3  days pending clinical improvement.  Status is: Inpatient  Remains inpatient appropriate because: Pending clinical improvement, IV antibiotic, status post urological intervention    Code Status:     Code Status: Full Code  Family Communication: None at bedside  Consultants: Urology  Procedures: Cystoscopy with left  ureteric stent placement on 06/18/2024  Anti-infectives:  Rocephin  IV  Anti-infectives (From admission, onward)    Start     Dose/Rate Route Frequency Ordered Stop   06/19/24 0200  cefTRIAXone  (ROCEPHIN ) 2 g in sodium chloride  0.9 % 100 mL IVPB        2 g 200 mL/hr over 30 Minutes Intravenous Every 24 hours 06/18/24 0622     06/18/24 0215  cefTRIAXone  (ROCEPHIN ) 2 g in sodium chloride  0.9 % 100 mL IVPB        2 g 200 mL/hr over 30 Minutes Intravenous Once 06/18/24 0205 06/18/24 0258        Subjective: Today, patient was seen and examined at bedside.  Patient still complains of mild abdominal pain.  Denies any nausea, vomiting, fever, chills or rigor.  Has been having some headache.  Denies any shortness of breath or dyspnea but wearing oxygen and wishes to take it off.  States that she was supposed to wear CPAP which is still in process.  Objective: Vitals:   06/19/24 0600 06/19/24 0800  BP: 111/77   Pulse: 65   Resp: 14   Temp:  97.8 F (36.6 C)  SpO2: 95%     Intake/Output Summary (Last 24 hours) at 06/19/2024 0943 Last data filed at 06/19/2024 9347 Gross per 24 hour  Intake 2217.58 ml  Output 650 ml  Net 1567.58 ml   Filed Weights   06/18/24 0254 06/18/24 1059  Weight: 105.1 kg 107.9 kg   Body mass index is 38.39 kg/m.   Physical Exam:  GENERAL: Patient is alert awake and oriented. Not in obvious distress.  Obese build, on nasal cannula oxygen HENT: No scleral pallor or icterus. Pupils equally reactive to light. Oral mucosa is moist NECK: is supple, no gross swelling noted. CHEST: Clear to auscultation. No crackles or wheezes.  Diminished breath sounds bilaterally. CVS: S1 and S2 heard, no murmur. Regular rate and rhythm.  ABDOMEN: Soft, bowel sounds are present.  Mild tenderness of the left upper quadrant, Foley catheter in place. EXTREMITIES: No edema. CNS: Cranial nerves are intact. No focal motor deficits. SKIN: warm and dry without rashes.  Data  Review: I have personally reviewed the following laboratory data and studies,  CBC: Recent Labs  Lab 06/18/24 0201 06/19/24 0304  WBC 14.8* 13.2*  NEUTROABS 12.7*  --   HGB 12.0 11.3*  HCT 36.2 35.6*  MCV 91.4 94.4  PLT 159 123*   Basic Metabolic Panel: Recent Labs  Lab 06/18/24 0201 06/19/24 0304  NA 130* 132*  K 3.5 3.4*  CL 88* 96*  CO2 21* 23  GLUCOSE 302* 208*  BUN 44* 61*  CREATININE 3.61* 4.03*  CALCIUM  8.7* 8.0*  MG  --  2.0  PHOS  --  4.6   Liver Function Tests: Recent Labs  Lab 06/18/24 0201 06/19/24 0304  AST 209* 261*  ALT 49* 73*  ALKPHOS 81 64  BILITOT 0.8 0.4  PROT 6.8 6.0*  ALBUMIN 3.2* 2.2*   No results for input(s): LIPASE, AMYLASE in the last 168 hours. No results for input(s): AMMONIA in the last 168 hours. Cardiac Enzymes: No results for input(s): CKTOTAL, CKMB, CKMBINDEX, TROPONINI in the last 168  hours. BNP (last 3 results) No results for input(s): BNP in the last 8760 hours.  ProBNP (last 3 results) No results for input(s): PROBNP in the last 8760 hours.  CBG: Recent Labs  Lab 06/18/24 1546 06/18/24 1943 06/18/24 2322 06/19/24 0339 06/19/24 0735  GLUCAP 240* 191* 184* 199* 189*   Recent Results (from the past 240 hours)  Blood Culture (routine x 2)     Status: None (Preliminary result)   Collection Time: 06/18/24  2:00 AM   Specimen: BLOOD  Result Value Ref Range Status   Specimen Description   Final    BLOOD RIGHT ANTECUBITAL Performed at [facility], [address] [place], [place] [address]    Special Requests   Final    BOTTLES DRAWN AEROBIC AND ANAEROBIC Blood Culture adequate volume Performed at [facility], [address] [place], [place] [address]    Culture  Setup Time   Final    GRAM NEGATIVE RODS IN BOTH AEROBIC AND ANAEROBIC BOTTLES CRITICAL VALUE NOTED.  VALUE IS CONSISTENT WITH PREVIOUSLY REPORTED AND CALLED VALUE. Performed at [facility], [address] [place], [place] [address]    Culture GRAM NEGATIVE RODS  Final   Report Status PENDING  Incomplete  Blood Culture (routine x 2)     Status: Abnormal (Preliminary result)   Collection Time: 06/18/24  2:10 AM   Specimen: BLOOD LEFT WRIST  Result Value Ref Range Status   Specimen Description   Final    BLOOD LEFT WRIST Performed at [facility], [address] [place], [place] [address]    Special Requests   Final    BOTTLES DRAWN AEROBIC AND ANAEROBIC Blood Culture adequate volume Performed at [facility], [address] [place], [place] [address]    Culture  Setup Time   Final    GRAM NEGATIVE RODS IN BOTH AEROBIC AND ANAEROBIC BOTTLES CRITICAL RESULT CALLED TO, READ BACK BY AND VERIFIED WITH: PHARMD L POINTDEXTER 06/19/2024 @ 0022 BY AB    Culture (A)  Final    ESCHERICHIA COLI SUSCEPTIBILITIES TO FOLLOW Performed at [facility], [address], [place], [place] [address]    Report Status PENDING  Incomplete  Blood Culture ID Panel (Reflexed)     Status: Abnormal   Collection Time: 06/18/24  2:10 AM  Result Value Ref Range Status   Enterococcus faecalis NOT DETECTED NOT DETECTED Final   Enterococcus Faecium NOT DETECTED NOT DETECTED Final   Listeria monocytogenes NOT DETECTED NOT DETECTED Final   Staphylococcus species NOT DETECTED NOT DETECTED Final   Staphylococcus aureus (BCID) NOT DETECTED NOT DETECTED Final   Staphylococcus epidermidis NOT DETECTED NOT DETECTED Final   Staphylococcus lugdunensis NOT DETECTED NOT DETECTED Final   Streptococcus species NOT DETECTED NOT DETECTED Final   Streptococcus agalactiae NOT DETECTED NOT DETECTED Final   Streptococcus pneumoniae NOT DETECTED NOT DETECTED Final   Streptococcus pyogenes NOT DETECTED NOT DETECTED Final   A.calcoaceticus-baumannii NOT DETECTED NOT DETECTED Final   Bacteroides fragilis NOT DETECTED NOT DETECTED Final   Enterobacterales DETECTED (A) NOT DETECTED Final    Comment:  Enterobacterales represent a large order of gram negative bacteria, not a single organism. CRITICAL RESULT CALLED TO, READ BACK BY AND VERIFIED WITH: PHARMD L POINTDEXTER 06/19/2024 @ 0022 BY AB    Enterobacter cloacae complex NOT DETECTED NOT DETECTED Final   Escherichia coli DETECTED (A) NOT DETECTED Final  Comment: CRITICAL RESULT CALLED TO, READ BACK BY AND VERIFIED WITH: PHARMD L POINTDEXTER 06/19/2024 @ 0022 BY AB    Klebsiella aerogenes NOT DETECTED NOT DETECTED Final   Klebsiella oxytoca NOT DETECTED NOT DETECTED Final   Klebsiella pneumoniae NOT DETECTED NOT DETECTED Final   Proteus species NOT DETECTED NOT DETECTED Final   Salmonella species NOT DETECTED NOT DETECTED Final   Serratia marcescens NOT DETECTED NOT DETECTED Final   Haemophilus influenzae NOT DETECTED NOT DETECTED Final   Neisseria meningitidis NOT DETECTED NOT DETECTED Final   Pseudomonas aeruginosa NOT DETECTED NOT DETECTED Final   Stenotrophomonas maltophilia NOT DETECTED NOT DETECTED Final   Candida albicans NOT DETECTED NOT DETECTED Final   Candida auris NOT DETECTED NOT DETECTED Final   Candida glabrata NOT DETECTED NOT DETECTED Final   Candida krusei NOT DETECTED NOT DETECTED Final   Candida parapsilosis NOT DETECTED NOT DETECTED Final   Candida tropicalis NOT DETECTED NOT DETECTED Final   Cryptococcus neoformans/gattii NOT DETECTED NOT DETECTED Final   CTX-M ESBL NOT DETECTED NOT DETECTED Final   Carbapenem resistance IMP NOT DETECTED NOT DETECTED Final   Carbapenem resistance KPC NOT DETECTED NOT DETECTED Final   Carbapenem resistance NDM NOT DETECTED NOT DETECTED Final   Carbapenem resist OXA 48 LIKE NOT DETECTED NOT DETECTED Final   Carbapenem resistance VIM NOT DETECTED NOT DETECTED Final    Comment: Performed at [facility], [address], [place], [place] [address]  Resp panel by RT-PCR (RSV, Flu A&B, Covid) Anterior Nasal Swab     Status: None   Collection Time: 06/18/24  3:11 AM    Specimen: Anterior Nasal Swab  Result Value Ref Range Status   SARS Coronavirus 2 by RT PCR NEGATIVE NEGATIVE Final    Comment: (NOTE) SARS-CoV-2 target nucleic acids are NOT DETECTED.  The SARS-CoV-2 RNA is generally detectable in upper respiratory specimens during the acute phase of infection. The lowest concentration of SARS-CoV-2 viral copies this assay can detect is 138 copies/mL. A negative result does not preclude SARS-Cov-2 infection and should not be used as the sole basis for treatment or other patient management decisions. A negative result may occur with  improper specimen collection/handling, submission of specimen other than nasopharyngeal swab, presence of viral mutation(s) within the areas targeted by this assay, and inadequate number of viral copies(<138 copies/mL). A negative result must be combined with clinical observations, patient history, and epidemiological information. The expected result is Negative.  Fact Sheet for Patients:  BloggerCourse.com  Fact Sheet for Healthcare Providers:  SeriousBroker.it  This test is no t yet approved or cleared by the [place]  FDA and  has been authorized for detection and/or diagnosis of SARS-CoV-2 by FDA under an Emergency Use Authorization (EUA). This EUA will remain  in effect (meaning this test can be used) for the duration of the COVID-19 declaration under Section 564(b)(1) of the Act, 21 U.S.C.section 360bbb-3(b)(1), unless the authorization is terminated  or revoked sooner.       Influenza A by PCR NEGATIVE NEGATIVE Final   Influenza B by PCR NEGATIVE NEGATIVE Final    Comment: (NOTE) The Xpert Xpress SARS-CoV-2/FLU/RSV plus assay is intended as an aid in the diagnosis of influenza from Nasopharyngeal swab specimens and should not be used as a sole basis for treatment. Nasal washings and aspirates are unacceptable for Xpert Xpress  SARS-CoV-2/FLU/RSV testing.  Fact Sheet for Patients: BloggerCourse.com  Fact Sheet for Healthcare Providers: SeriousBroker.it  This test is not yet approved or  cleared by the [place]  FDA and has been authorized for detection and/or diagnosis of SARS-CoV-2 by FDA under an Emergency Use Authorization (EUA). This EUA will remain in effect (meaning this test can be used) for the duration of the COVID-19 declaration under Section 564(b)(1) of the Act, 21 U.S.C. section 360bbb-3(b)(1), unless the authorization is terminated or revoked.     Resp Syncytial Virus by PCR NEGATIVE NEGATIVE Final    Comment: (NOTE) Fact Sheet for Patients: BloggerCourse.com  Fact Sheet for Healthcare Providers: SeriousBroker.it  This test is not yet approved or cleared by the [place]  FDA and has been authorized for detection and/or diagnosis of SARS-CoV-2 by FDA under an Emergency Use Authorization (EUA). This EUA will remain in effect (meaning this test can be used) for the duration of the COVID-19 declaration under Section 564(b)(1) of the Act, 21 U.S.C. section 360bbb-3(b)(1), unless the authorization is terminated or revoked.  Performed at [facility], [address] [place], [place] [address]   Urine Culture     Status: Abnormal (Preliminary result)   Collection Time: 06/18/24  7:24 AM   Specimen: Urine, Cystoscope  Result Value Ref Range Status   Specimen Description   Final    CYSTOSCOPY URINE CYSTOSCOPE, PT ON CEFTRIAXONE  Performed at [facility], [address] [place], [place] [address]    Special Requests   Final    NONE Performed at [facility], [address], [place], [place] [address]    Culture (A)  Final    20,000 COLONIES/mL GRAM NEGATIVE RODS SUSCEPTIBILITIES TO FOLLOW Performed at [facility], [address] [place], [place] [address]    Report Status PENDING  Incomplete  MRSA Next Gen by PCR, Nasal     Status: None   Collection Time: 06/18/24 10:53 AM   Specimen: Nasal Mucosa; Nasal Swab  Result Value Ref Range Status   MRSA by PCR Next Gen NOT DETECTED NOT DETECTED Final    Comment: (NOTE) The GeneXpert MRSA Assay (FDA approved for NASAL specimens only), is one component of a comprehensive MRSA colonization surveillance program. It is not intended to diagnose MRSA infection nor to guide or monitor treatment for MRSA infections. Test performance is not FDA approved in patients less than 69 years old. Performed at [facility], [address], [place], [place] [address]      Studies: DG C-Arm 1-60 Min-No Report Result Date: 06/18/2024 Fluoroscopy was utilized by the requesting physician.  No radiographic interpretation.   DG Chest Port 1 View Result Date: 06/18/2024 CLINICAL DATA:  Possible sepsis EXAM: PORTABLE CHEST 1 VIEW COMPARISON:  04/13/2017 FINDINGS: Cardiac shadow is prominent but accentuated by the frontal technique. The lungs are clear. No bony abnormality is noted. IMPRESSION: No acute abnormality noted. Electronically Signed   By: Oneil Devonshire M.D.   On: 06/18/2024 03:13   CT Renal Stone Study Result Date: 06/18/2024 CLINICAL DATA:  Flank pain EXAM: CT ABDOMEN AND PELVIS WITHOUT CONTRAST TECHNIQUE: Multidetector CT imaging of the abdomen and pelvis was performed following the standard protocol without IV contrast. RADIATION DOSE REDUCTION: This exam was performed according to the departmental dose-optimization program which includes automated exposure control, adjustment of the mA and/or kV according to patient size and/or use of iterative reconstruction technique. COMPARISON:  06/15/2024 FINDINGS: Lower chest: Mild left basilar atelectasis is noted. Hepatobiliary: No focal liver abnormality is seen. Status post cholecystectomy. No biliary dilatation.  Pancreas: Unremarkable. No pancreatic  ductal dilatation or surrounding inflammatory changes. Spleen: Normal in size without focal abnormality. Adrenals/Urinary Tract: Adrenal glands are within normal limits. Right kidney shows no calculi or obstructive changes. Left kidney demonstrates proximal ureteral stone stable in appearance from the prior exam measuring 6 mm. Mild perinephric stranding is seen. No left renal calculi are noted. The bladder is within normal limits. Stomach/Bowel: No obstructive or inflammatory changes of the colon are noted. The appendix is not visualized consistent with a prior surgical history. Small bowel and stomach are unremarkable. Vascular/Lymphatic: Aortic atherosclerosis. No enlarged abdominal or pelvic lymph nodes. Reproductive: Status post hysterectomy. No adnexal masses. Other: No abdominal wall hernia or abnormality. No abdominopelvic ascites. Musculoskeletal: No acute or significant osseous findings. IMPRESSION: Stable proximal left ureteral stone similar to that seen on the prior exam. Mild hydronephrosis is noted. No new focal abnormality is seen. Electronically Signed   By: Oneil Devonshire M.D.   On: 06/18/2024 02:54      Vernal Alstrom, MD  [facility] 06/19/2024  If 7PM-7AM, please contact night-coverage

## 2024-06-19 NOTE — Consult Note (Signed)
 Urology Consult   Physician requesting consult: Dr. Raford  Reason for consult: Infected obstructing stone  History of Present Illness: Sue Davidson is a 73 y.o. hx of HTN, T2DM, HLD presenting with weakness and malaise, presented 3 days ago with flank pain found to have a 6mm left proximal ureteral stone with mild hydronephrosis.  She was febrile and tachycardic on initial presentation requiring 3L [place], heart rate has responded to fluid resuscitation. Labs notable for, UA pending, leukocytosis to 14.8, Cr 3.61 from ~1 baseline, initial lactate 5.9, down to 4.6 with fluid resuscitation and imaging redemonstrating stone. Urine and blood cultures pending  She denies a history of voiding or storage urinary symptoms, hematuria, UTIs, STDs, urolithiasis, GU malignancy/trauma/surgery.  Interval 8/12: S/p L ureteral stent placement yesterday. Has remained afebrile with vital signs stable, on 2L [place], leukocytosis improving, hgb stable, Cr continues to uptrend to 4 today from 3.6 on admission. Blood cultures with E. Coli, urine culture pending.   Impression/Recommendation: 73yo now s/p L ureteral stent placement with blood cultures positive for E. Coli  -Continue broad spectrum antibiotics and tailor to culture data when possible -Expect combination of pre-renal and obstructive component of AKI as patient stated she wasn't eating or drinking well for several days prior to admission -Patient will require outpatient definitive surgery for stone treatment  Past Medical History:  Diagnosis Date   Anemia    hx of   Anginal pain (HCC)    hx of   Anxiety    Depression    Diabetes mellitus without complication (HCC)    GERD (gastroesophageal reflux disease)    Headache(784.0)    Heart murmur    Hyperlipidemia    Hypertension    MRSA (methicillin resistant staph aureus) culture positive    many years ago-abdominal wound- no issuses since.   PONV (postoperative nausea and vomiting)    severe    Sleep apnea    Study done -remains under evaluation- no cpap yet.   Vitamin D deficiency     Past Surgical History:  Procedure Laterality Date   ABDOMINAL HYSTERECTOMY  1970's   APPENDECTOMY  1970's   BREAST BIOPSY Left 2018   CARDIAC CATHETERIZATION     CHOLECYSTECTOMY N/A 10/02/2013   Procedure: LAPAROSCOPIC CHOLECYSTECTOMY;  Surgeon: Krystal CHRISTELLA Spinner, MD;  Location: [facility];  Service: General;  Laterality: N/A;   cyst removed Left    wristganglion   CYSTOSCOPY WITH STENT PLACEMENT Left 06/18/2024   Procedure: CYSTOSCOPY, WITH STENT INSERTION;  Surgeon: Renda Glance, MD;  Location: [facility];  Service: Urology;  Laterality: Left;   ELBOW SURGERY Left    HARDWARE REMOVAL Left    ankle   KNEE ARTHROSCOPY Right 06/30/2016   Procedure: ARTHROSCOPY RIGHT KNEE WITH MEDIAL AND LATERAL MENSICAL DEBRIDEMENT;  Surgeon: Dempsey Moan, MD;  Location: [facility];  Service: Orthopedics;  Laterality: Right;  LMA   LEG SURGERY Left 1980's   broke leg and ankle   SHOULDER ARTHROSCOPY Bilateral    TONSILLECTOMY  1970's   TUBAL LIGATION  1970's     Current Hospital Medications:  Home meds:  No current facility-administered medications on file prior to encounter.   Current Outpatient Medications on File Prior to Encounter  Medication Sig Dispense Refill   acetaminophen  (TYLENOL ) 650 MG CR tablet Take 1,300 mg by mouth every 8 (eight) hours as needed for pain.     amLODipine  (NORVASC ) 2.5 MG tablet Take 1 tablet (2.5 mg total) by mouth daily. 90 tablet 3  aspirin  81 MG tablet Take 81 mg by mouth daily.     buPROPion  (WELLBUTRIN  XL) 150 MG 24 hr tablet Take 150 mg by mouth daily.     Cholecalciferol (VITAMIN D-3 PO) Take 1 capsule by mouth daily.     furosemide  (LASIX ) 80 MG tablet TAKE 1 TABLET BY MOUTH EVERY DAY 90 tablet 3   insulin  NPH-regular Human (NOVOLIN 70/30) (70-30) 100 UNIT/ML injection Inject 10-20 Units into the skin See admin instructions. Inject 10-20 units into the skin with  breakfast, inject 15 units with lunch if BS > 110 (otherwise no insulin ) and inject 20 units with dinner.     levothyroxine  (SYNTHROID ) 75 MCG tablet Take 75 mcg by mouth daily before breakfast.     olmesartan  (BENICAR ) 40 MG tablet TAKE 1 TABLET BY MOUTH EVERY DAY 90 tablet 3   omeprazole (PRILOSEC) 40 MG capsule Take 40 mg by mouth 2 (two) times daily.     ondansetron  (ZOFRAN ) 8 MG tablet Take 8 mg by mouth daily as needed for nausea or vomiting.     rosuvastatin  (CRESTOR ) 40 MG tablet Take 40 mg by mouth daily.     sertraline  (ZOLOFT ) 100 MG tablet Take 100 mg by mouth daily.     tamsulosin  (FLOMAX ) 0.4 MG CAPS capsule Take 1 capsule (0.4 mg total) by mouth daily after supper. (Patient taking differently: Take 0.4 mg by mouth daily.) 30 capsule 0   tirzepatide (MOUNJARO) 5 MG/0.5ML Pen Inject 5 mg into the skin every Monday.     morphine  (MSIR) 15 MG tablet Take 0.5 tablets (7.5 mg total) by mouth every 4 (four) hours as needed for severe pain (pain score 7-10). (Patient not taking: Reported on 06/18/2024) 7 tablet 0   ondansetron  (ZOFRAN -ODT) 4 MG disintegrating tablet 4mg  ODT q4 hours prn nausea/vomit (Patient not taking: Reported on 06/18/2024) 20 tablet 0     Scheduled Meds:  amLODipine   2.5 mg Oral Daily   aspirin  EC  81 mg Oral Daily   buPROPion   150 mg Oral q morning   Chlorhexidine  Gluconate Cloth  6 each Topical Daily   gabapentin   100 mg Oral QHS   insulin  aspart  0-20 Units Subcutaneous Q4H   levothyroxine   75 mcg Oral Q0600   pantoprazole   40 mg Oral Daily   rosuvastatin   10 mg Oral Q breakfast   sertraline   100 mg Oral Daily   tamsulosin   0.4 mg Oral QPC supper   Continuous Infusions:  cefTRIAXone  (ROCEPHIN )  IV Stopped (06/19/24 0442)   PRN Meds:.HYDROmorphone  (DILAUDID ) injection, mouth rinse, oxyCODONE , polyethylene glycol, prochlorperazine   Allergies:  Allergies  Allergen Reactions   Actos [Pioglitazone] Other (See Comments)    Unknown reaction   Codeine Nausea  And Vomiting   Ms Contin  [Morphine ] Other (See Comments)    Altered mental state Somnolence   Neurontin  [Gabapentin ] Other (See Comments)    Confusion Altered mental state   Ozempic (0.25 Or 0.5 Mg-Dose) [Semaglutide(0.25 Or 0.5mg -Dos)] Nausea And Vomiting   Tape Rash    Family History  Problem Relation Age of Onset   Diabetes Mother    Heart disease Mother        CABG age 70s   Cancer Father        throat and lung   Heart disease Father    Cancer Paternal Aunt        breast   Breast cancer Paternal Aunt    Cancer Paternal Aunt        colon  Social History:  reports that she has never smoked. She has never used smokeless tobacco. She reports that she does not drink alcohol and does not use drugs.  ROS: A complete review of systems was performed.  All systems are negative except for pertinent findings as noted.  Physical Exam:  Vital signs in last 24 hours: Temp:  [96.4 F (35.8 C)-98.5 F (36.9 C)] 96.4 F (35.8 C) (08/12 0339) Pulse Rate:  [63-84] 73 (08/12 0500) Resp:  [7-25] 15 (08/12 0500) BP: (71-174)/(39-91) 119/52 (08/12 0500) SpO2:  [90 %-100 %] 94 % (08/12 0500) Weight:  [107.9 kg] 107.9 kg (08/11 1059) Constitutional:  Alert and oriented, No acute distress Cardiovascular: Sinus tachycardia, No JVD Respiratory: Normal respiratory effort, Lungs clear bilaterally GI: Abdomen is soft, nontender, nondistended, no abdominal masses GU: No CVA tenderness Lymphatic: No lymphadenopathy Neurologic: Grossly intact, no focal deficits Psychiatric: Normal mood and affect  Laboratory Data:  Recent Labs    06/18/24 0201 06/19/24 0304  WBC 14.8* 13.2*  HGB 12.0 11.3*  HCT 36.2 35.6*  PLT 159 123*    Recent Labs    06/18/24 0201 06/19/24 0304  NA 130* 132*  K 3.5 3.4*  CL 88* 96*  GLUCOSE 302* 208*  BUN 44* 61*  CALCIUM  8.7* 8.0*  CREATININE 3.61* 4.03*     Results for orders placed or performed during the hospital encounter of 06/18/24 (from the  past 24 hours)  Glucose, capillary     Status: Abnormal   Collection Time: 06/18/24  6:54 AM  Result Value Ref Range   Glucose-Capillary 265 (H) 70 - 99 mg/dL  Hemoglobin J8r     Status: Abnormal   Collection Time: 06/18/24  8:14 AM  Result Value Ref Range   Hgb A1c MFr Bld 8.0 (H) 4.8 - 5.6 %   Mean Plasma Glucose 183 mg/dL  Glucose, capillary     Status: Abnormal   Collection Time: 06/18/24  9:07 AM  Result Value Ref Range   Glucose-Capillary 255 (H) 70 - 99 mg/dL  MRSA Next Gen by PCR, Nasal     Status: None   Collection Time: 06/18/24 10:53 AM   Specimen: Nasal Mucosa; Nasal Swab  Result Value Ref Range   MRSA by PCR Next Gen NOT DETECTED NOT DETECTED  Glucose, capillary     Status: Abnormal   Collection Time: 06/18/24 11:48 AM  Result Value Ref Range   Glucose-Capillary 263 (H) 70 - 99 mg/dL  Glucose, capillary     Status: Abnormal   Collection Time: 06/18/24  3:46 PM  Result Value Ref Range   Glucose-Capillary 240 (H) 70 - 99 mg/dL  Glucose, capillary     Status: Abnormal   Collection Time: 06/18/24  7:43 PM  Result Value Ref Range   Glucose-Capillary 191 (H) 70 - 99 mg/dL  Glucose, capillary     Status: Abnormal   Collection Time: 06/18/24 11:22 PM  Result Value Ref Range   Glucose-Capillary 184 (H) 70 - 99 mg/dL  CBC     Status: Abnormal   Collection Time: 06/19/24  3:04 AM  Result Value Ref Range   WBC 13.2 (H) 4.0 - 10.5 K/uL   RBC 3.77 (L) 3.87 - 5.11 MIL/uL   Hemoglobin 11.3 (L) 12.0 - 15.0 g/dL   HCT 64.3 (L) 63.9 - 53.9 %   MCV 94.4 80.0 - 100.0 fL   MCH 30.0 26.0 - 34.0 pg   MCHC 31.7 30.0 - 36.0 g/dL   RDW 85.8 88.4 -  15.5 %   Platelets 123 (L) 150 - 400 K/uL   nRBC 0.0 0.0 - 0.2 %  Comprehensive metabolic panel     Status: Abnormal   Collection Time: 06/19/24  3:04 AM  Result Value Ref Range   Sodium 132 (L) 135 - 145 mmol/L   Potassium 3.4 (L) 3.5 - 5.1 mmol/L   Chloride 96 (L) 98 - 111 mmol/L   CO2 23 22 - 32 mmol/L   Glucose, Bld 208 (H) 70 -  99 mg/dL   BUN 61 (H) 8 - 23 mg/dL   Creatinine, Ser 5.96 (H) 0.44 - 1.00 mg/dL   Calcium  8.0 (L) 8.9 - 10.3 mg/dL   Total Protein 6.0 (L) 6.5 - 8.1 g/dL   Albumin 2.2 (L) 3.5 - 5.0 g/dL   AST 738 (H) 15 - 41 U/L   ALT 73 (H) 0 - 44 U/L   Alkaline Phosphatase 64 38 - 126 U/L   Total Bilirubin 0.4 0.0 - 1.2 mg/dL   GFR, Estimated 11 (L) >60 mL/min   Anion gap 13 5 - 15  Magnesium     Status: None   Collection Time: 06/19/24  3:04 AM  Result Value Ref Range   Magnesium 2.0 1.7 - 2.4 mg/dL  Phosphorus     Status: None   Collection Time: 06/19/24  3:04 AM  Result Value Ref Range   Phosphorus 4.6 2.5 - 4.6 mg/dL  Glucose, capillary     Status: Abnormal   Collection Time: 06/19/24  3:39 AM  Result Value Ref Range   Glucose-Capillary 199 (H) 70 - 99 mg/dL   Recent Results (from the past 240 hours)  Blood Culture (routine x 2)     Status: None (Preliminary result)   Collection Time: 06/18/24  2:00 AM   Specimen: BLOOD  Result Value Ref Range Status   Specimen Description   Final    BLOOD RIGHT ANTECUBITAL Performed at [facility], [address] [place], [place] [address]    Special Requests   Final    BOTTLES DRAWN AEROBIC AND ANAEROBIC Blood Culture adequate volume Performed at [facility], [address] [place], [place] [address]    Culture  Setup Time   Final    GRAM NEGATIVE RODS IN BOTH AEROBIC AND ANAEROBIC BOTTLES CRITICAL VALUE NOTED.  VALUE IS CONSISTENT WITH PREVIOUSLY REPORTED AND CALLED VALUE. Performed at [facility], [address], [place], [place] [address]    Culture GRAM NEGATIVE RODS  Final   Report Status PENDING  Incomplete  Blood Culture (routine x 2)     Status: None (Preliminary result)   Collection Time: 06/18/24  2:10 AM   Specimen: BLOOD LEFT WRIST  Result Value Ref Range Status   Specimen Description   Final    BLOOD LEFT WRIST Performed at [facility], [address] [place], [place] [address]     Special Requests   Final    BOTTLES DRAWN AEROBIC AND ANAEROBIC Blood Culture adequate volume Performed at [facility], [address] [place], [place] [address]    Culture  Setup Time   Final    GRAM NEGATIVE RODS IN BOTH AEROBIC AND ANAEROBIC BOTTLES CRITICAL RESULT CALLED TO, READ BACK BY AND VERIFIED WITH: PHARMD L POINTDEXTER 06/19/2024 @ 0022 BY AB Performed at [facility], [address] [place], [place] [address]    Culture GRAM NEGATIVE RODS  Final  Report Status PENDING  Incomplete  Blood Culture ID Panel (Reflexed)     Status: Abnormal   Collection Time: 06/18/24  2:10 AM  Result Value Ref Range Status   Enterococcus faecalis NOT DETECTED NOT DETECTED Final   Enterococcus Faecium NOT DETECTED NOT DETECTED Final   Listeria monocytogenes NOT DETECTED NOT DETECTED Final   Staphylococcus species NOT DETECTED NOT DETECTED Final   Staphylococcus aureus (BCID) NOT DETECTED NOT DETECTED Final   Staphylococcus epidermidis NOT DETECTED NOT DETECTED Final   Staphylococcus lugdunensis NOT DETECTED NOT DETECTED Final   Streptococcus species NOT DETECTED NOT DETECTED Final   Streptococcus agalactiae NOT DETECTED NOT DETECTED Final   Streptococcus pneumoniae NOT DETECTED NOT DETECTED Final   Streptococcus pyogenes NOT DETECTED NOT DETECTED Final   A.calcoaceticus-baumannii NOT DETECTED NOT DETECTED Final   Bacteroides fragilis NOT DETECTED NOT DETECTED Final   Enterobacterales DETECTED (A) NOT DETECTED Final    Comment: Enterobacterales represent a large order of gram negative bacteria, not a single organism. CRITICAL RESULT CALLED TO, READ BACK BY AND VERIFIED WITH: PHARMD L POINTDEXTER 06/19/2024 @ 0022 BY AB    Enterobacter cloacae complex NOT DETECTED NOT DETECTED Final   Escherichia coli DETECTED (A) NOT DETECTED Final    Comment: CRITICAL RESULT CALLED TO, READ BACK BY AND VERIFIED WITH: PHARMD L POINTDEXTER 06/19/2024 @ 0022 BY AB    Klebsiella  aerogenes NOT DETECTED NOT DETECTED Final   Klebsiella oxytoca NOT DETECTED NOT DETECTED Final   Klebsiella pneumoniae NOT DETECTED NOT DETECTED Final   Proteus species NOT DETECTED NOT DETECTED Final   Salmonella species NOT DETECTED NOT DETECTED Final   Serratia marcescens NOT DETECTED NOT DETECTED Final   Haemophilus influenzae NOT DETECTED NOT DETECTED Final   Neisseria meningitidis NOT DETECTED NOT DETECTED Final   Pseudomonas aeruginosa NOT DETECTED NOT DETECTED Final   Stenotrophomonas maltophilia NOT DETECTED NOT DETECTED Final   Candida albicans NOT DETECTED NOT DETECTED Final   Candida auris NOT DETECTED NOT DETECTED Final   Candida glabrata NOT DETECTED NOT DETECTED Final   Candida krusei NOT DETECTED NOT DETECTED Final   Candida parapsilosis NOT DETECTED NOT DETECTED Final   Candida tropicalis NOT DETECTED NOT DETECTED Final   Cryptococcus neoformans/gattii NOT DETECTED NOT DETECTED Final   CTX-M ESBL NOT DETECTED NOT DETECTED Final   Carbapenem resistance IMP NOT DETECTED NOT DETECTED Final   Carbapenem resistance KPC NOT DETECTED NOT DETECTED Final   Carbapenem resistance NDM NOT DETECTED NOT DETECTED Final   Carbapenem resist OXA 48 LIKE NOT DETECTED NOT DETECTED Final   Carbapenem resistance VIM NOT DETECTED NOT DETECTED Final    Comment: Performed at [facility], [address], [place], [place] [address]  Resp panel by RT-PCR (RSV, Flu A&B, Covid) Anterior Nasal Swab     Status: None   Collection Time: 06/18/24  3:11 AM   Specimen: Anterior Nasal Swab  Result Value Ref Range Status   SARS Coronavirus 2 by RT PCR NEGATIVE NEGATIVE Final    Comment: (NOTE) SARS-CoV-2 target nucleic acids are NOT DETECTED.  The SARS-CoV-2 RNA is generally detectable in upper respiratory specimens during the acute phase of infection. The lowest concentration of SARS-CoV-2 viral copies this assay can detect is 138 copies/mL. A negative result does not preclude  SARS-Cov-2 infection and should not be used as the sole basis for treatment or other patient management decisions. A negative result may occur with  improper specimen collection/handling, submission of specimen other than nasopharyngeal swab,  presence of viral mutation(s) within the areas targeted by this assay, and inadequate number of viral copies(<138 copies/mL). A negative result must be combined with clinical observations, patient history, and epidemiological information. The expected result is Negative.  Fact Sheet for Patients:  BloggerCourse.com  Fact Sheet for Healthcare Providers:  SeriousBroker.it  This test is no t yet approved or cleared by the [place]  FDA and  has been authorized for detection and/or diagnosis of SARS-CoV-2 by FDA under an Emergency Use Authorization (EUA). This EUA will remain  in effect (meaning this test can be used) for the duration of the COVID-19 declaration under Section 564(b)(1) of the Act, 21 U.S.C.section 360bbb-3(b)(1), unless the authorization is terminated  or revoked sooner.       Influenza A by PCR NEGATIVE NEGATIVE Final   Influenza B by PCR NEGATIVE NEGATIVE Final    Comment: (NOTE) The Xpert Xpress SARS-CoV-2/FLU/RSV plus assay is intended as an aid in the diagnosis of influenza from Nasopharyngeal swab specimens and should not be used as a sole basis for treatment. Nasal washings and aspirates are unacceptable for Xpert Xpress SARS-CoV-2/FLU/RSV testing.  Fact Sheet for Patients: BloggerCourse.com  Fact Sheet for Healthcare Providers: SeriousBroker.it  This test is not yet approved or cleared by the [place]  FDA and has been authorized for detection and/or diagnosis of SARS-CoV-2 by FDA under an Emergency Use Authorization (EUA). This EUA will remain in effect (meaning this test can be used) for the duration of  the COVID-19 declaration under Section 564(b)(1) of the Act, 21 U.S.C. section 360bbb-3(b)(1), unless the authorization is terminated or revoked.     Resp Syncytial Virus by PCR NEGATIVE NEGATIVE Final    Comment: (NOTE) Fact Sheet for Patients: BloggerCourse.com  Fact Sheet for Healthcare Providers: SeriousBroker.it  This test is not yet approved or cleared by the [place]  FDA and has been authorized for detection and/or diagnosis of SARS-CoV-2 by FDA under an Emergency Use Authorization (EUA). This EUA will remain in effect (meaning this test can be used) for the duration of the COVID-19 declaration under Section 564(b)(1) of the Act, 21 U.S.C. section 360bbb-3(b)(1), unless the authorization is terminated or revoked.  Performed at [facility], [address] [place], [place] [address]   MRSA Next Gen by PCR, Nasal     Status: None   Collection Time: 06/18/24 10:53 AM   Specimen: Nasal Mucosa; Nasal Swab  Result Value Ref Range Status   MRSA by PCR Next Gen NOT DETECTED NOT DETECTED Final    Comment: (NOTE) The GeneXpert MRSA Assay (FDA approved for NASAL specimens only), is one component of a comprehensive MRSA colonization surveillance program. It is not intended to diagnose MRSA infection nor to guide or monitor treatment for MRSA infections. Test performance is not FDA approved in patients less than 64 years old. Performed at [facility], [address] [place], [place] [address]     Renal Function: Recent Labs    06/18/24 0201 06/19/24 0304  CREATININE 3.61* 4.03*   Estimated Creatinine Clearance: 15.2 mL/min (A) (by C-G formula based on SCr of 4.03 mg/dL (H)).  Radiologic Imaging: DG C-Arm 1-60 Min-No Report Result Date: 06/18/2024 Fluoroscopy was utilized by the requesting physician.  No radiographic interpretation.   DG Chest Port 1 View Result Date:  06/18/2024 CLINICAL DATA:  Possible sepsis EXAM: PORTABLE CHEST 1 VIEW COMPARISON:  04/13/2017 FINDINGS: Cardiac shadow is prominent but accentuated by the frontal technique. The lungs are clear. No  bony abnormality is noted. IMPRESSION: No acute abnormality noted. Electronically Signed   By: Oneil Devonshire M.D.   On: 06/18/2024 03:13   CT Renal Stone Study Result Date: 06/18/2024 CLINICAL DATA:  Flank pain EXAM: CT ABDOMEN AND PELVIS WITHOUT CONTRAST TECHNIQUE: Multidetector CT imaging of the abdomen and pelvis was performed following the standard protocol without IV contrast. RADIATION DOSE REDUCTION: This exam was performed according to the departmental dose-optimization program which includes automated exposure control, adjustment of the mA and/or kV according to patient size and/or use of iterative reconstruction technique. COMPARISON:  06/15/2024 FINDINGS: Lower chest: Mild left basilar atelectasis is noted. Hepatobiliary: No focal liver abnormality is seen. Status post cholecystectomy. No biliary dilatation. Pancreas: Unremarkable. No pancreatic ductal dilatation or surrounding inflammatory changes. Spleen: Normal in size without focal abnormality. Adrenals/Urinary Tract: Adrenal glands are within normal limits. Right kidney shows no calculi or obstructive changes. Left kidney demonstrates proximal ureteral stone stable in appearance from the prior exam measuring 6 mm. Mild perinephric stranding is seen. No left renal calculi are noted. The bladder is within normal limits. Stomach/Bowel: No obstructive or inflammatory changes of the colon are noted. The appendix is not visualized consistent with a prior surgical history. Small bowel and stomach are unremarkable. Vascular/Lymphatic: Aortic atherosclerosis. No enlarged abdominal or pelvic lymph nodes. Reproductive: Status post hysterectomy. No adnexal masses. Other: No abdominal wall hernia or abnormality. No abdominopelvic ascites. Musculoskeletal: No acute  or significant osseous findings. IMPRESSION: Stable proximal left ureteral stone similar to that seen on the prior exam. Mild hydronephrosis is noted. No new focal abnormality is seen. Electronically Signed   By: Oneil Devonshire M.D.   On: 06/18/2024 02:54    I independently reviewed the above imaging studies.   Jacqulyn CHRISTELLA Bound 06/19/2024, 6:50 AM

## 2024-06-20 DIAGNOSIS — N201 Calculus of ureter: Secondary | ICD-10-CM

## 2024-06-20 DIAGNOSIS — N179 Acute kidney failure, unspecified: Secondary | ICD-10-CM | POA: Diagnosis not present

## 2024-06-20 LAB — BASIC METABOLIC PANEL WITH GFR
Anion gap: 14 (ref 5–15)
BUN: 70 mg/dL — ABNORMAL HIGH (ref 8–23)
CO2: 20 mmol/L — ABNORMAL LOW (ref 22–32)
Calcium: 7.7 mg/dL — ABNORMAL LOW (ref 8.9–10.3)
Chloride: 96 mmol/L — ABNORMAL LOW (ref 98–111)
Creatinine, Ser: 4.62 mg/dL — ABNORMAL HIGH (ref 0.44–1.00)
GFR, Estimated: 9 mL/min — ABNORMAL LOW (ref >60)
Glucose, Bld: 225 mg/dL — ABNORMAL HIGH (ref 70–99)
Potassium: 4.1 mmol/L (ref 3.5–5.1)
Sodium: 130 mmol/L — ABNORMAL LOW (ref 135–145)

## 2024-06-20 LAB — CBC
HCT: 35.1 % — ABNORMAL LOW (ref 36.0–46.0)
Hemoglobin: 11.2 g/dL — ABNORMAL LOW (ref 12.0–15.0)
MCH: 29.9 pg (ref 26.0–34.0)
MCHC: 31.9 g/dL (ref 30.0–36.0)
MCV: 93.6 fL (ref 80.0–100.0)
Platelets: 117 K/uL — ABNORMAL LOW (ref 150–400)
RBC: 3.75 MIL/uL — ABNORMAL LOW (ref 3.87–5.11)
RDW: 14.2 % (ref 11.5–15.5)
WBC: 13.9 K/uL — ABNORMAL HIGH (ref 4.0–10.5)
nRBC: 0.1 % (ref 0.0–0.2)

## 2024-06-20 LAB — GLUCOSE, CAPILLARY
Glucose-Capillary: 201 mg/dL — ABNORMAL HIGH (ref 70–99)
Glucose-Capillary: 184 mg/dL — ABNORMAL HIGH (ref 70–99)
Glucose-Capillary: 182 mg/dL — ABNORMAL HIGH (ref 70–99)
Glucose-Capillary: 146 mg/dL — ABNORMAL HIGH (ref 70–99)

## 2024-06-20 LAB — CULTURE, BLOOD (ROUTINE X 2): Special Requests: ADEQUATE

## 2024-06-20 LAB — PHOSPHORUS: Phosphorus: 4.8 mg/dL — ABNORMAL HIGH (ref 2.5–4.6)

## 2024-06-20 LAB — MAGNESIUM: Magnesium: 2.1 mg/dL (ref 1.7–2.4)

## 2024-06-20 LAB — URINE CULTURE: Culture: 20000 — AB

## 2024-06-20 MED ORDER — CEFAZOLIN SODIUM-DEXTROSE 2-4 GM/100ML-% IV SOLN
2.0000 g | Freq: Two times a day (BID) | INTRAVENOUS | Status: DC
  Administered 2024-06-20 – 2024-06-22 (×8): 2 g via INTRAVENOUS
  Filled 2024-06-20 (×6): qty 100

## 2024-06-20 MED ORDER — SODIUM CHLORIDE 0.9 % IV SOLN: INTRAVENOUS | Status: AC

## 2024-06-20 MED ORDER — INSULIN GLARGINE-YFGN 100 UNIT/ML ~~LOC~~ SOLN
12.0000 [IU] | Freq: Every day | SUBCUTANEOUS | Status: DC
  Administered 2024-06-20 – 2024-06-24 (×6): 12 [IU] via SUBCUTANEOUS
  Filled 2024-06-20 (×6): qty 0.12

## 2024-06-20 NOTE — Inpatient Diabetes Management (Signed)
 Inpatient Diabetes Program Recommendations  AACE/ADA: New Consensus Statement on Inpatient Glycemic Control (2015)  Target Ranges:  Prepandial:   less than 140 mg/dL      Peak postprandial:   less than 180 mg/dL (1-2 hours)      Critically ill patients:  140 - 180 mg/dL   Lab Results  Component Value Date   GLUCAP 201 (H) 06/20/2024   HGBA1C 8.0 (H) 06/18/2024    Review of Glycemic Control  Latest Reference Range & Units 06/18/24 23:22 06/19/24 03:39 06/19/24 07:35 06/19/24 11:27 06/19/24 15:21 06/19/24 21:45 06/20/24 07:51  Glucose-Capillary 70 - 99 mg/dL 815 (H) 800 (H) 810 (H) 191 (H) 237 (H) 200 (H) 201 (H)  (H): Data is abnormally high Diabetes history: Type 2 DM Outpatient Diabetes medications: Novolog  70/30 - 10-20 units QA, 35 units QP Current orders for Inpatient glycemic control: Novolog  0-20 units TID & HS  Inpatient Diabetes Program Recommendations:    Consider adding Semglee  15 units every day.  Thanks, Tinnie Minus, MSN, RNC-OB Diabetes Coordinator [phone] (8a-5p)

## 2024-06-20 NOTE — Progress Notes (Signed)
 PROGRESS NOTE  Maicee Ullman FMW:[id number] DOB: Oct 14, 1951 DOA: 06/18/2024 PCP: Clarice Nottingham, MD   LOS: 2 days   Brief narrative:  Marylen Zuk is a 73 y.o. female with medical history significant of anemia,  anxiety, depression, bilateral carotid arterial disease, type 2 diabetes, class II obesity, GERD, grade 1 diastolic dysfunction,  pulmonary artery hypertension, hyperlipidemia,  sleep apnea,  history of urolithiasis presented to the ED with left flank pain.  In the ED patient was febrile tachycardic tachypneic, hypotensive and required supplemental oxygen.  Urinalysis showed abnormal urinalysis with more than 50 white cells.  COVID influenza and RSV was negative.  CBC showed elevated WBC at 14.8 with 85% neutrophils.  Lactic acid was initially elevated at 5.9 followed by 4.6.  CMP showed hyponatremia with sodium of 130 and creatinine elevation at 3.6 from baseline 0.8.  AST and ALT mildly elevated.  CT renal study showed left hydroureteronephrosis with 7 mm calculus in the proximal left ureter.  She had multiple falls at home after having generalized weakness.    Sepsis protocol ordered and urology was consulted.  Patient was given IV Rocephin  and vasopressors initially.  Patient was then seen by urology and underwent cystoscopy with left ureteral stent placement.  Patient was then admitted hospital for further evaluation and treatment.    Assessment/Plan:  Sepsis secondary to  Acute UTI (urinary tract infection) E. coli bacteremia. Urolithiasis with  Hydronephrosis of left kidney Seen by urology and patient underwent cystoscopy with left ureteral stent placement on 06/18/2024.   Blood cultures growing E. coli.  Sensitivities reviewed.  Change over from ceftriaxone  to cefazolin  today.  Hopefully transition to oral antibiotics once her renal function starts improving. Noted to be afebrile.  WBC is noted to be elevated.  Continue to monitor.  AKI (acute kidney injury)  Creatinine was  0.84 in 2024 and she came in with a creatinine of 3.61. Likely multifactorial in a combination of hypotension, use of medications, sepsis, poor oral intake and obstructive pathology since she was not eating and drinking for several days prior to presentation.    Hold ARB and ACE inhibitors, diuretics.   Anticipate creatinine will plateau soon and start improving.  Noted to be higher today at 4.6 compared to 4.0 yesterday.  Does have urine output.  Continue to monitor closely. UA reviewed.    Diabetes mellitus type 2, uncontrolled with hyperglycemia HbA1c 8.0. Patient is on 70/30 insulin  at home.  Currently on SSI.  Will add glargine.  Hypokalemia.   Supplemented.  Continue to monitor.     Depression  Continue bupropion  and sertraline .   Hypothyroidism  Continue Synthroid .   OSA (obstructive sleep apnea) Still working to obtain Emogene. Declined to use a hospital CPAP device.  Currently on supplemental oxygen by nasal cannula.   Essential hypertension Continue amlodipine .  Hold olmesartan    Dyslipidemia Continue Crestor    Gastroesophageal reflux disease Continue PPI   Class 2 obesity Current BMI 38.39 kg/m.  Would benefit from lifestyle modification.  DVT prophylaxis: SCDs Start: 06/18/24 9375 Disposition: Likely home, in 2 to 3 days pending clinical improvement. Code Status:    Code Status: Full Code Family Communication: None at bedside  Consultants: Urology  Procedures: Cystoscopy with left ureteric stent placement on 06/18/2024   Subjective: Patient feels well.  Denies any abdominal pain.  Has been making urine.  No nausea or vomiting.  No shortness of breath.    Objective: Vitals:   06/20/24 0800 06/20/24 0803  BP:  ROLLEN)  131/56  Pulse:  69  Resp:  16  Temp: 97.8 F (36.6 C)   SpO2:  96%    Intake/Output Summary (Last 24 hours) at 06/20/2024 0957 Last data filed at 06/20/2024 0805 Gross per 24 hour  Intake 2220.51 ml  Output 500 ml  Net 1720.51 ml    Filed Weights   06/18/24 0254 06/18/24 1059  Weight: 105.1 kg 107.9 kg   Body mass index is 38.39 kg/m.   Physical Exam:  General appearance: Awake alert.  In no distress Resp: Clear to auscultation bilaterally.  Normal effort Cardio: S1-S2 is normal regular.  No S3-S4.  No rubs murmurs or bruit GI: Abdomen is soft.  Nontender nondistended.  Bowel sounds are present normal.  No masses organomegaly Extremities: No edema.  Full range of motion of lower extremities. Neurologic: Alert and oriented x3.  No focal neurological deficits.    Data Review: I have personally reviewed the following laboratory data and studies,  CBC: Recent Labs  Lab 06/18/24 0201 06/19/24 0304 06/20/24 0305  WBC 14.8* 13.2* 13.9*  NEUTROABS 12.7*  --   --   HGB 12.0 11.3* 11.2*  HCT 36.2 35.6* 35.1*  MCV 91.4 94.4 93.6  PLT 159 123* 117*   Basic Metabolic Panel: Recent Labs  Lab 06/18/24 0201 06/19/24 0304 06/20/24 0305  NA 130* 132* 130*  K 3.5 3.4* 4.1  CL 88* 96* 96*  CO2 21* 23 20*  GLUCOSE 302* 208* 225*  BUN 44* 61* 70*  CREATININE 3.61* 4.03* 4.62*  CALCIUM  8.7* 8.0* 7.7*  MG  --  2.0 2.1  PHOS  --  4.6 4.8*   Liver Function Tests: Recent Labs  Lab 06/18/24 0201 06/19/24 0304  AST 209* 261*  ALT 49* 73*  ALKPHOS 81 64  BILITOT 0.8 0.4  PROT 6.8 6.0*  ALBUMIN 3.2* 2.2*   CBG: Recent Labs  Lab 06/19/24 0735 06/19/24 1127 06/19/24 1521 06/19/24 2145 06/20/24 0751  GLUCAP 189* 191* 237* 200* 201*   Recent Results (from the past 240 hours)  Blood Culture (routine x 2)     Status: None (Preliminary result)   Collection Time: 06/18/24  2:00 AM   Specimen: BLOOD  Result Value Ref Range Status   Specimen Description   Final    BLOOD RIGHT ANTECUBITAL Performed at [facility], [address] [place], [place] [address]    Special Requests   Final    BOTTLES DRAWN AEROBIC AND ANAEROBIC Blood Culture adequate volume Performed at [facility],  [address] [place], [place] [address]    Culture  Setup Time   Final    GRAM NEGATIVE RODS IN BOTH AEROBIC AND ANAEROBIC BOTTLES CRITICAL VALUE NOTED.  VALUE IS CONSISTENT WITH PREVIOUSLY REPORTED AND CALLED VALUE. Performed at [facility], [address], [place], [place] [address]    Culture GRAM NEGATIVE RODS  Final   Report Status PENDING  Incomplete  Blood Culture (routine x 2)     Status: Abnormal   Collection Time: 06/18/24  2:10 AM   Specimen: BLOOD LEFT WRIST  Result Value Ref Range Status   Specimen Description   Final    BLOOD LEFT WRIST Performed at [facility], [address] [place], [place] [address]    Special Requests   Final    BOTTLES DRAWN AEROBIC AND ANAEROBIC Blood Culture adequate volume Performed at [facility], [address] [place],  [place] [address]    Culture  Setup Time   Final    GRAM NEGATIVE RODS IN BOTH AEROBIC AND ANAEROBIC BOTTLES CRITICAL RESULT CALLED TO, READ BACK BY AND VERIFIED WITH: PHARMD L POINTDEXTER 06/19/2024 @ 0022 BY AB Performed at [facility], [address], [place], [place] [address]    Culture ESCHERICHIA COLI (A)  Final   Report Status 06/20/2024 FINAL  Final   Organism ID, Bacteria ESCHERICHIA COLI  Final      Susceptibility   Escherichia coli - MIC*    AMPICILLIN 4 SENSITIVE Sensitive     CEFAZOLIN  Value in next row Sensitive      <=1 SENSITIVEThis is a modified FDA-approved test that has been validated and its performance characteristics determined by the reporting laboratory.  This laboratory is certified under the Clinical Laboratory Improvement Amendments CLIA as qualified to perform high complexity clinical laboratory testing.    CEFEPIME Value in next row Sensitive      <=1 SENSITIVEThis is a modified FDA-approved test that has been validated and its performance characteristics determined by the reporting laboratory.  This laboratory is certified under the Clinical  Laboratory Improvement Amendments CLIA as qualified to perform high complexity clinical laboratory testing.    ERTAPENEM Value in next row Sensitive      <=1 SENSITIVEThis is a modified FDA-approved test that has been validated and its performance characteristics determined by the reporting laboratory.  This laboratory is certified under the Clinical Laboratory Improvement Amendments CLIA as qualified to perform high complexity clinical laboratory testing.    CEFTRIAXONE  Value in next row Sensitive      <=1 SENSITIVEThis is a modified FDA-approved test that has been validated and its performance characteristics determined by the reporting laboratory.  This laboratory is certified under the Clinical Laboratory Improvement Amendments CLIA as qualified to perform high complexity clinical laboratory testing.    CIPROFLOXACIN Value in next row Sensitive      <=1 SENSITIVEThis is a modified FDA-approved test that has been validated and its performance characteristics determined by the reporting laboratory.  This laboratory is certified under the Clinical Laboratory Improvement Amendments CLIA as qualified to perform high complexity clinical laboratory testing.    GENTAMICIN Value in next row Sensitive      <=1 SENSITIVEThis is a modified FDA-approved test that has been validated and its performance characteristics determined by the reporting laboratory.  This laboratory is certified under the Clinical Laboratory Improvement Amendments CLIA as qualified to perform high complexity clinical laboratory testing.    MEROPENEM Value in next row Sensitive      <=1 SENSITIVEThis is a modified FDA-approved test that has been validated and its performance characteristics determined by the reporting laboratory.  This laboratory is certified under the Clinical Laboratory Improvement Amendments CLIA as qualified to perform high complexity clinical laboratory testing.    TRIMETH/SULFA Value in next row Sensitive      <=1  SENSITIVEThis is a modified FDA-approved test that has been validated and its performance characteristics determined by the reporting laboratory.  This laboratory is certified under the Clinical Laboratory Improvement Amendments CLIA as qualified to perform high complexity clinical laboratory testing.    AMPICILLIN/SULBACTAM Value in next row Sensitive      <=1 SENSITIVEThis is a modified FDA-approved test that has been validated and its performance characteristics determined by the reporting laboratory.  This laboratory is certified under the Clinical Laboratory Improvement Amendments CLIA as qualified to perform high complexity clinical laboratory testing.  PIP/TAZO Value in next row Sensitive ug/mL     <=4 SENSITIVEThis is a modified FDA-approved test that has been validated and its performance characteristics determined by the reporting laboratory.  This laboratory is certified under the Clinical Laboratory Improvement Amendments CLIA as qualified to perform high complexity clinical laboratory testing.    * ESCHERICHIA COLI  Blood Culture ID Panel (Reflexed)     Status: Abnormal   Collection Time: 06/18/24  2:10 AM  Result Value Ref Range Status   Enterococcus faecalis NOT DETECTED NOT DETECTED Final   Enterococcus Faecium NOT DETECTED NOT DETECTED Final   Listeria monocytogenes NOT DETECTED NOT DETECTED Final   Staphylococcus species NOT DETECTED NOT DETECTED Final   Staphylococcus aureus (BCID) NOT DETECTED NOT DETECTED Final   Staphylococcus epidermidis NOT DETECTED NOT DETECTED Final   Staphylococcus lugdunensis NOT DETECTED NOT DETECTED Final   Streptococcus species NOT DETECTED NOT DETECTED Final   Streptococcus agalactiae NOT DETECTED NOT DETECTED Final   Streptococcus pneumoniae NOT DETECTED NOT DETECTED Final   Streptococcus pyogenes NOT DETECTED NOT DETECTED Final   A.calcoaceticus-baumannii NOT DETECTED NOT DETECTED Final   Bacteroides fragilis NOT DETECTED NOT DETECTED Final    Enterobacterales DETECTED (A) NOT DETECTED Final    Comment: Enterobacterales represent a large order of gram negative bacteria, not a single organism. CRITICAL RESULT CALLED TO, READ BACK BY AND VERIFIED WITH: PHARMD L POINTDEXTER 06/19/2024 @ 0022 BY AB    Enterobacter cloacae complex NOT DETECTED NOT DETECTED Final   Escherichia coli DETECTED (A) NOT DETECTED Final    Comment: CRITICAL RESULT CALLED TO, READ BACK BY AND VERIFIED WITH: PHARMD L POINTDEXTER 06/19/2024 @ 0022 BY AB    Klebsiella aerogenes NOT DETECTED NOT DETECTED Final   Klebsiella oxytoca NOT DETECTED NOT DETECTED Final   Klebsiella pneumoniae NOT DETECTED NOT DETECTED Final   Proteus species NOT DETECTED NOT DETECTED Final   Salmonella species NOT DETECTED NOT DETECTED Final   Serratia marcescens NOT DETECTED NOT DETECTED Final   Haemophilus influenzae NOT DETECTED NOT DETECTED Final   Neisseria meningitidis NOT DETECTED NOT DETECTED Final   Pseudomonas aeruginosa NOT DETECTED NOT DETECTED Final   Stenotrophomonas maltophilia NOT DETECTED NOT DETECTED Final   Candida albicans NOT DETECTED NOT DETECTED Final   Candida auris NOT DETECTED NOT DETECTED Final   Candida glabrata NOT DETECTED NOT DETECTED Final   Candida krusei NOT DETECTED NOT DETECTED Final   Candida parapsilosis NOT DETECTED NOT DETECTED Final   Candida tropicalis NOT DETECTED NOT DETECTED Final   Cryptococcus neoformans/gattii NOT DETECTED NOT DETECTED Final   CTX-M ESBL NOT DETECTED NOT DETECTED Final   Carbapenem resistance IMP NOT DETECTED NOT DETECTED Final   Carbapenem resistance KPC NOT DETECTED NOT DETECTED Final   Carbapenem resistance NDM NOT DETECTED NOT DETECTED Final   Carbapenem resist OXA 48 LIKE NOT DETECTED NOT DETECTED Final   Carbapenem resistance VIM NOT DETECTED NOT DETECTED Final    Comment: Performed at [facility], [address] [place], [place] [address]  Resp panel by RT-PCR (RSV, Flu A&B, Covid) Anterior Nasal Swab      Status: None   Collection Time: 06/18/24  3:11 AM   Specimen: Anterior Nasal Swab  Result Value Ref Range Status   SARS Coronavirus 2 by RT PCR NEGATIVE NEGATIVE Final    Comment: (NOTE) SARS-CoV-2 target nucleic acids are NOT DETECTED.  The SARS-CoV-2 RNA is generally detectable in upper respiratory specimens during the acute phase of infection. The lowest  concentration of SARS-CoV-2 viral copies this assay can detect is 138 copies/mL. A negative result does not preclude SARS-Cov-2 infection and should not be used as the sole basis for treatment or other patient management decisions. A negative result may occur with  improper specimen collection/handling, submission of specimen other than nasopharyngeal swab, presence of viral mutation(s) within the areas targeted by this assay, and inadequate number of viral copies(<138 copies/mL). A negative result must be combined with clinical observations, patient history, and epidemiological information. The expected result is Negative.  Fact Sheet for Patients:  BloggerCourse.com  Fact Sheet for Healthcare Providers:  SeriousBroker.it  This test is no t yet approved or cleared by the [place]  FDA and  has been authorized for detection and/or diagnosis of SARS-CoV-2 by FDA under an Emergency Use Authorization (EUA). This EUA will remain  in effect (meaning this test can be used) for the duration of the COVID-19 declaration under Section 564(b)(1) of the Act, 21 U.S.C.section 360bbb-3(b)(1), unless the authorization is terminated  or revoked sooner.       Influenza A by PCR NEGATIVE NEGATIVE Final   Influenza B by PCR NEGATIVE NEGATIVE Final    Comment: (NOTE) The Xpert Xpress SARS-CoV-2/FLU/RSV plus assay is intended as an aid in the diagnosis of influenza from Nasopharyngeal swab specimens and should not be used as a sole basis for treatment. Nasal washings and aspirates are  unacceptable for Xpert Xpress SARS-CoV-2/FLU/RSV testing.  Fact Sheet for Patients: BloggerCourse.com  Fact Sheet for Healthcare Providers: SeriousBroker.it  This test is not yet approved or cleared by the [place]  FDA and has been authorized for detection and/or diagnosis of SARS-CoV-2 by FDA under an Emergency Use Authorization (EUA). This EUA will remain in effect (meaning this test can be used) for the duration of the COVID-19 declaration under Section 564(b)(1) of the Act, 21 U.S.C. section 360bbb-3(b)(1), unless the authorization is terminated or revoked.     Resp Syncytial Virus by PCR NEGATIVE NEGATIVE Final    Comment: (NOTE) Fact Sheet for Patients: BloggerCourse.com  Fact Sheet for Healthcare Providers: SeriousBroker.it  This test is not yet approved or cleared by the [place]  FDA and has been authorized for detection and/or diagnosis of SARS-CoV-2 by FDA under an Emergency Use Authorization (EUA). This EUA will remain in effect (meaning this test can be used) for the duration of the COVID-19 declaration under Section 564(b)(1) of the Act, 21 U.S.C. section 360bbb-3(b)(1), unless the authorization is terminated or revoked.  Performed at [facility], [address], [place], [place] [address]   Urine Culture     Status: Abnormal   Collection Time: 06/18/24  7:24 AM   Specimen: Urine, Cystoscope  Result Value Ref Range Status   Specimen Description   Final    CYSTOSCOPY URINE CYSTOSCOPE, PT ON CEFTRIAXONE  Performed at [facility], [address], [place], [place] [address]    Special Requests   Final    NONE Performed at [facility], [address] [place], [place] [address]    Culture 20,000 COLONIES/mL ESCHERICHIA COLI (A)  Final   Report Status 06/20/2024 FINAL  Final   Organism ID, Bacteria  ESCHERICHIA COLI (A)  Final      Susceptibility   Escherichia coli - MIC*    AMPICILLIN 4 SENSITIVE Sensitive     CEFAZOLIN  Value in next row Sensitive      <=1 SENSITIVEThis is a modified FDA-approved test that has been validated and its  performance characteristics determined by the reporting laboratory.  This laboratory is certified under the Clinical Laboratory Improvement Amendments CLIA as qualified to perform high complexity clinical laboratory testing.    CEFEPIME Value in next row Sensitive      <=1 SENSITIVEThis is a modified FDA-approved test that has been validated and its performance characteristics determined by the reporting laboratory.  This laboratory is certified under the Clinical Laboratory Improvement Amendments CLIA as qualified to perform high complexity clinical laboratory testing.    ERTAPENEM Value in next row Sensitive      <=1 SENSITIVEThis is a modified FDA-approved test that has been validated and its performance characteristics determined by the reporting laboratory.  This laboratory is certified under the Clinical Laboratory Improvement Amendments CLIA as qualified to perform high complexity clinical laboratory testing.    CEFTRIAXONE  Value in next row Sensitive      <=1 SENSITIVEThis is a modified FDA-approved test that has been validated and its performance characteristics determined by the reporting laboratory.  This laboratory is certified under the Clinical Laboratory Improvement Amendments CLIA as qualified to perform high complexity clinical laboratory testing.    CIPROFLOXACIN Value in next row Sensitive      <=1 SENSITIVEThis is a modified FDA-approved test that has been validated and its performance characteristics determined by the reporting laboratory.  This laboratory is certified under the Clinical Laboratory Improvement Amendments CLIA as qualified to perform high complexity clinical laboratory testing.    GENTAMICIN Value in next row Sensitive      <=1  SENSITIVEThis is a modified FDA-approved test that has been validated and its performance characteristics determined by the reporting laboratory.  This laboratory is certified under the Clinical Laboratory Improvement Amendments CLIA as qualified to perform high complexity clinical laboratory testing.    MEROPENEM Value in next row Sensitive      <=1 SENSITIVEThis is a modified FDA-approved test that has been validated and its performance characteristics determined by the reporting laboratory.  This laboratory is certified under the Clinical Laboratory Improvement Amendments CLIA as qualified to perform high complexity clinical laboratory testing.    TRIMETH/SULFA Value in next row Sensitive      <=1 SENSITIVEThis is a modified FDA-approved test that has been validated and its performance characteristics determined by the reporting laboratory.  This laboratory is certified under the Clinical Laboratory Improvement Amendments CLIA as qualified to perform high complexity clinical laboratory testing.    AMPICILLIN/SULBACTAM Value in next row Sensitive      <=1 SENSITIVEThis is a modified FDA-approved test that has been validated and its performance characteristics determined by the reporting laboratory.  This laboratory is certified under the Clinical Laboratory Improvement Amendments CLIA as qualified to perform high complexity clinical laboratory testing.    PIP/TAZO Value in next row Sensitive ug/mL     <=4 SENSITIVEThis is a modified FDA-approved test that has been validated and its performance characteristics determined by the reporting laboratory.  This laboratory is certified under the Clinical Laboratory Improvement Amendments CLIA as qualified to perform high complexity clinical laboratory testing.    * 20,000 COLONIES/mL ESCHERICHIA COLI  MRSA Next Gen by PCR, Nasal     Status: None   Collection Time: 06/18/24 10:53 AM   Specimen: Nasal Mucosa; Nasal Swab  Result Value Ref Range Status   MRSA by  PCR Next Gen NOT DETECTED NOT DETECTED Final    Comment: (NOTE) The GeneXpert MRSA Assay (FDA approved for NASAL specimens only), is one component of a comprehensive MRSA  colonization surveillance program. It is not intended to diagnose MRSA infection nor to guide or monitor treatment for MRSA infections. Test performance is not FDA approved in patients less than 81 years old. Performed at [facility], [address], [place], [place] [address]      Studies: No results found.   Joette Pebbles, MD  [facility] 06/20/2024  If 7PM-7AM, please contact night-coverage

## 2024-06-20 NOTE — Plan of Care (Signed)
 ?  Problem: Clinical Measurements: ?Goal: Ability to maintain clinical measurements within normal limits will improve ?Outcome: Progressing ?Goal: Will remain free from infection ?Outcome: Progressing ?Goal: Diagnostic test results will improve ?Outcome: Progressing ?  ?

## 2024-06-20 NOTE — Progress Notes (Signed)
 Patient ID: Sue Davidson, female   DOB: 1951/08/26, 73 y.o.   MRN: [id number]  2 Days Post-Op Subjective: Doing better.  Still weak.  Objective: Vital signs in last 24 hours: Temp:  [96.3 F (35.7 C)-97.8 F (36.6 C)] 97.4 F (36.3 C) (08/13 1112) Pulse Rate:  [67-75] 75 (08/13 1112) Resp:  [12-17] 16 (08/13 1112) BP: (96-140)/(44-85) 96/85 (08/13 1112) SpO2:  [93 %-99 %] 99 % (08/13 1112)  Intake/Output from previous day: 08/12 0701 - 08/13 0700 In: 2220.5 [I.V.:2220.5] Out: 400 [Urine:400] Intake/Output this shift: Total I/O In: 271 [I.V.:271] Out: 100 [Urine:100]  Physical Exam:  General: Alert and oriented   Lab Results: Recent Labs    06/18/24 0201 06/19/24 0304 06/20/24 0305  HGB 12.0 11.3* 11.2*  HCT 36.2 35.6* 35.1*      Latest Ref Rng & Units 06/20/2024    3:05 AM 06/19/2024    3:04 AM 06/18/2024    2:01 AM  CBC  WBC 4.0 - 10.5 K/uL 13.9  13.2  14.8   Hemoglobin 12.0 - 15.0 g/dL 88.7  88.6  87.9   Hematocrit 36.0 - 46.0 % 35.1  35.6  36.2   Platelets 150 - 400 K/uL 117  123  159      BMET Recent Labs    06/19/24 0304 06/20/24 0305  NA 132* 130*  K 3.4* 4.1  CL 96* 96*  CO2 23 20*  GLUCOSE 208* 225*  BUN 61* 70*  CREATININE 4.03* 4.62*  CALCIUM  8.0* 7.7*     Studies/Results: Urine culture: E coli pansensitive  Assessment/Plan: 1) Left ureteral calculus/sepsis: Transition to oral antibiotics and will need 7-10 days of appropriate antibiotic therapy.  Will arrange outpatient follow up for definitive stone treatment.  2) AKI: S/P left ureteral stent 8/11.  This is likely prerenal and hopefully will begin improving tomorrow.   LOS: 2 days   Noretta Ferrara 06/20/2024, 4:13 PM

## 2024-06-21 ENCOUNTER — Inpatient Hospital Stay (HOSPITAL_COMMUNITY)

## 2024-06-21 DIAGNOSIS — N201 Calculus of ureter: Secondary | ICD-10-CM | POA: Diagnosis not present

## 2024-06-21 DIAGNOSIS — N179 Acute kidney failure, unspecified: Secondary | ICD-10-CM | POA: Diagnosis not present

## 2024-06-21 LAB — BASIC METABOLIC PANEL WITH GFR
Anion gap: 13 (ref 5–15)
BUN: 76 mg/dL — ABNORMAL HIGH (ref 8–23)
CO2: 20 mmol/L — ABNORMAL LOW (ref 22–32)
Calcium: 7.7 mg/dL — ABNORMAL LOW (ref 8.9–10.3)
Chloride: 99 mmol/L (ref 98–111)
Creatinine, Ser: 5.24 mg/dL — ABNORMAL HIGH (ref 0.44–1.00)
GFR, Estimated: 8 mL/min — ABNORMAL LOW (ref >60)
Glucose, Bld: 120 mg/dL — ABNORMAL HIGH (ref 70–99)
Potassium: 3.1 mmol/L — ABNORMAL LOW (ref 3.5–5.1)
Sodium: 132 mmol/L — ABNORMAL LOW (ref 135–145)

## 2024-06-21 LAB — URINALYSIS, ROUTINE W REFLEX MICROSCOPIC
Bilirubin Urine: NEGATIVE
Glucose, UA: NEGATIVE mg/dL
Ketones, ur: NEGATIVE mg/dL
Nitrite: NEGATIVE
Protein, ur: 100 mg/dL — AB
Specific Gravity, Urine: 1.005 (ref 1.005–1.030)
WBC, UA: >50 WBC/hpf (ref 0–5)
pH: 5 (ref 5.0–8.0)

## 2024-06-21 LAB — CULTURE, BLOOD (ROUTINE X 2): Special Requests: ADEQUATE

## 2024-06-21 LAB — GLUCOSE, CAPILLARY
Glucose-Capillary: 127 mg/dL — ABNORMAL HIGH (ref 70–99)
Glucose-Capillary: 108 mg/dL — ABNORMAL HIGH (ref 70–99)
Glucose-Capillary: 124 mg/dL — ABNORMAL HIGH (ref 70–99)
Glucose-Capillary: 126 mg/dL — ABNORMAL HIGH (ref 70–99)

## 2024-06-21 LAB — CBC
HCT: 32 % — ABNORMAL LOW (ref 36.0–46.0)
Hemoglobin: 10.2 g/dL — ABNORMAL LOW (ref 12.0–15.0)
MCH: 29.3 pg (ref 26.0–34.0)
MCHC: 31.9 g/dL (ref 30.0–36.0)
MCV: 92 fL (ref 80.0–100.0)
Platelets: 128 K/uL — ABNORMAL LOW (ref 150–400)
RBC: 3.48 MIL/uL — ABNORMAL LOW (ref 3.87–5.11)
RDW: 14.4 % (ref 11.5–15.5)
WBC: 10.2 K/uL (ref 4.0–10.5)
nRBC: 0 % (ref 0.0–0.2)

## 2024-06-21 LAB — SODIUM, URINE, RANDOM: Sodium, Ur: 31 mmol/L

## 2024-06-21 LAB — LACTATE DEHYDROGENASE: LDH: 268 U/L — ABNORMAL HIGH (ref 98–192)

## 2024-06-21 MED ORDER — SODIUM CHLORIDE 0.9 % IV BOLUS
500.0000 mL | Freq: Once | INTRAVENOUS | Status: AC
  Administered 2024-06-21: 500 mL via INTRAVENOUS

## 2024-06-21 MED ORDER — POTASSIUM CHLORIDE CRYS ER 20 MEQ PO TBCR
20.0000 meq | EXTENDED_RELEASE_TABLET | Freq: Once | ORAL | Status: AC
  Administered 2024-06-21: 20 meq via ORAL
  Filled 2024-06-21: qty 1

## 2024-06-21 MED ORDER — SODIUM CHLORIDE 0.9 % IV SOLN: INTRAVENOUS | Status: DC

## 2024-06-21 NOTE — Evaluation (Signed)
 Physical Therapy Evaluation Patient Details Name: Mandy Peeks MRN: [id number] DOB: 1951/08/27 Today's Date: 06/21/2024  History of Present Illness  ita Ramla Hase is a 73 y.o. female admitted 06/18/24 with L flank pain, multiple falls, weakness.CT renal study showed left hydroureteronephrosis with 7 mm calculus in the proximal left ureter, S/P cystoscopy with L ureteral stent placed.  PMH: anemia,  anxiety, depression, bilateral carotid arterial disease, type 2 diabetes, class II obesity, GERD, grade 1 diastolic dysfunction,  pulmonary artery hypertension, hyperlipidemia,  sleep apnea,  history of urolithiasis presented to the ED with left flank pain.  In the ED patient was febrile tachycardic tachypneic, hypotensive and required supplemental oxygen.  Clinical Impression  Pt admitted with above diagnosis.  Pt currently with functional limitations due to the deficits listed below (see PT Problem List). Pt will benefit from acute skilled PT to increase their independence and safety with mobility to allow discharge.       The patient reports feeling weak, reporting right hip pain. Reports had a fall on  8/9. Patient required mod assistance to mobilize to sitting. Attempted multiple times to stand at Rw, finally able to stand  with +2 max assist. When making a step the right leg buckled and patient sat down onto bed. Patient reportedly has been a + 1 assist and ambulated in the hall yesterday. Appears significant change.  Patient will benefit from continued inpatient follow up therapy, <3 hours/day.  Patient resides with  family and was independent in ambulation with no device in the apartment and used RW when going out.    If plan is discharge home, recommend the following: Two people to help with walking and/or transfers;A lot of help with bathing/dressing/bathroom   Can travel by private vehicle        Equipment Recommendations None recommended by PT  Recommendations for Other Services        Functional Status Assessment Patient has had a recent decline in their functional status and demonstrates the ability to make significant improvements in function in a reasonable and predictable amount of time.     Precautions / Restrictions Precautions Precautions: Fall Precaution/Restrictions Comments: right leg buckled Restrictions Weight Bearing Restrictions Per Provider Order: No      Mobility  Bed Mobility Overal bed mobility: Needs Assistance Bed Mobility: Supine to Sit, Sit to Supine     Supine to sit: Mod assist Sit to supine: Max assist, +2 for safety/equipment   General bed mobility comments: assist legs assist trunk to sit up and move to bed edge with momentum. Max +2 to stand from bed, made attempt to step and right leg buckled and pt sat down to bed.    Transfers                        Ambulation/Gait                  Stairs            Wheelchair Mobility     Tilt Bed    Modified Rankin (Stroke Patients Only)       Balance Overall balance assessment: Needs assistance Sitting-balance support: Single extremity supported, Feet supported Sitting balance-Leahy Scale: Fair     Standing balance support: Bilateral upper extremity supported, During functional activity, Reliant on assistive device for balance Standing balance-Leahy Scale: Poor  Pertinent Vitals/Pain Pain Assessment Pain Assessment: Faces Faces Pain Scale: Hurts whole lot Pain Location: right  hip. low back Pain Descriptors / Indicators: Discomfort Pain Intervention(s): Patient requesting pain meds-RN notified, Repositioned, Premedicated before session, Limited activity within patient's tolerance, Monitored during session    Home Living Family/patient expects to be discharged to:: Private residence Living Arrangements: Children Available Help at Discharge: Family Type of Home: Apartment Home Access: Level entry        Home Layout: One level Home Equipment: Agricultural consultant (2 wheels);Rollator (4 wheels)      Prior Function Prior Level of Function : Independent/Modified Independent             Mobility Comments: usually does not use AD in apt, rollator outside ADLs Comments: ind.     Extremity/Trunk Assessment   Upper Extremity Assessment Upper Extremity Assessment: Overall WFL for tasks assessed    Lower Extremity Assessment Lower Extremity Assessment: RLE deficits/detail RLE Deficits / Details: decreased active hip flexion in sitting, leg buckled when standing RLE: Unable to fully assess due to pain    Cervical / Trunk Assessment Cervical / Trunk Assessment: Normal  Communication   Communication Communication: No apparent difficulties    Cognition Arousal: Alert Behavior During Therapy: WFL for tasks assessed/performed   PT - Cognitive impairments: No apparent impairments                         Following commands: Intact       Cueing Cueing Techniques: Verbal cues     General Comments      Exercises     Assessment/Plan    PT Assessment Patient needs continued PT services  PT Problem List Decreased strength;Decreased range of motion;Decreased knowledge of use of DME;Decreased activity tolerance;Decreased safety awareness;Decreased balance;Decreased mobility;Pain       PT Treatment Interventions DME instruction;Gait training;Patient/family education;Functional mobility training;Therapeutic activities;Therapeutic exercise    PT Goals (Current goals can be found in the Care Plan section)  Acute Rehab PT Goals Patient Stated Goal: to go home PT Goal Formulation: With patient/family Time For Goal Achievement: 07/05/24 Potential to Achieve Goals: Good    Frequency Min 2X/week     Co-evaluation               AM-PAC PT 6 Clicks Mobility  Outcome Measure Help needed turning from your back to your side while in a flat bed without using  bedrails?: A Lot Help needed moving from lying on your back to sitting on the side of a flat bed without using bedrails?: A Lot Help needed moving to and from a bed to a chair (including a wheelchair)?: A Lot Help needed standing up from a chair using your arms (e.g., wheelchair or bedside chair)?: Total Help needed to walk in hospital room?: Total Help needed climbing 3-5 steps with a railing? : Total 6 Click Score: 9    End of Session Equipment Utilized During Treatment: Gait belt Activity Tolerance: Patient limited by fatigue;Patient limited by pain Patient left: in bed;with family/visitor present;with call bell/phone within reach;with bed alarm set Nurse Communication: Mobility status PT Visit Diagnosis: Unsteadiness on feet (R26.81);Other abnormalities of gait and mobility (R26.89);Muscle weakness (generalized) (M62.81);History of falling (Z91.81);Pain Pain - Right/Left: Right Pain - part of body: Hip    Time: 1127-1149 PT Time Calculation (min) (ACUTE ONLY): 22 min   Charges:   PT Evaluation $PT Eval Low Complexity: 1 Low   PT General Charges $$ ACUTE PT  VISIT: 1 Visit         Darice Potters PT Acute Rehabilitation Services Office [phone]   Potters Darice Norris 06/21/2024, 2:39 PM

## 2024-06-21 NOTE — Consult Note (Signed)
 Reason for Consult: AKI Referring Physician: Verdene, MD  Sue Davidson is an 73 y.o. female has a PMH significant for HTN, DM type 2, HLD, bilateral carotid artery stenosis, obesity, HFpEF, pulmonary HTN, and nephrolithiasis who presented to [facility] ED via EMS on 06/18/24 with recent diagnosis of left-sided kidney stone and poor po intake.  Family reported multiple falls prior to admission.  She was also complaining of left flank pain.  In the ED, temp 101.9, HR 110, Bp 138/54, RR 20, SpO2 97%.  Labs were notable for WBC 14.8, Na 130, Cl 88, Co2 21, BUN 44, Cr 3.61, gluc 302, alb 3.2, AST 209, ALT 49, alk phos 49, lactate 5.9 and blood cultures were + for E coli.  She was admitted for urosepsis and started on pressors initially.  Urology was consulted and performed cystoscopy and left ureteral stent placement on 06/18/24.  She slowly improved, however her AKI has continued to worsen which prompted our consultation.  The trend in Scr is seen below.  Of note, she had been on olmesartan  and furosemide  prior to admission.  Her blood pressures have also continued to be on the low side and several of her anti-hypertensive meds were stopped.   UOP has ranged 400-650 mL/day.  She is feeling a little better but doesn't have an appetite.    Trend in Creatinine: Creatinine, Ser  Date/Time Value Ref Range Status  06/21/2024 05:12 AM 5.24 (H) 0.44 - 1.00 mg/dL Final  91/86/7974 96:94 AM 4.62 (H) 0.44 - 1.00 mg/dL Final  91/87/7974 96:95 AM 4.03 (H) 0.44 - 1.00 mg/dL Final  91/88/7974 97:98 AM 3.61 (H) 0.44 - 1.00 mg/dL Final  97/94/7975 92:75 PM 0.84 0.44 - 1.00 mg/dL Final  90/74/7979 91:51 AM 1.03 (H) 0.57 - 1.00 mg/dL Final  90/76/7980 91:54 AM 0.89 0.57 - 1.00 mg/dL Final  92/87/7981 96:55 PM 0.85 0.57 - 1.00 mg/dL Final  93/91/7981 96:88 AM 1.30 (H) 0.44 - 1.00 mg/dL Final  93/92/7981 92:88 AM 1.83 (H) 0.44 - 1.00 mg/dL Final  93/93/7981 93:49 PM 1.73 (H) 0.44 - 1.00 mg/dL Final  91/84/7982 88:69 AM  0.76 0.44 - 1.00 mg/dL Final  88/82/7985 98:94 PM 0.95 0.50 - 1.10 mg/dL Final  90/94/7987 96:49 PM 1.11 (H) 0.50 - 1.10 mg/dL Final  90/94/7987 88:60 AM 1.10 0.50 - 1.10 mg/dL Final    PMH:   Past Medical History:  Diagnosis Date   Anemia    hx of   Anginal pain (HCC)    hx of   Anxiety    Depression    Diabetes mellitus without complication (HCC)    GERD (gastroesophageal reflux disease)    Headache(784.0)    Heart murmur    Hyperlipidemia    Hypertension    MRSA (methicillin resistant staph aureus) culture positive    many years ago-abdominal wound- no issuses since.   PONV (postoperative nausea and vomiting)    severe   Sleep apnea    Study done -remains under evaluation- no cpap yet.   Vitamin D deficiency     PSH:   Past Surgical History:  Procedure Laterality Date   ABDOMINAL HYSTERECTOMY  1970's   APPENDECTOMY  1970's   BREAST BIOPSY Left 2018   CARDIAC CATHETERIZATION     CHOLECYSTECTOMY N/A 10/02/2013   Procedure: LAPAROSCOPIC CHOLECYSTECTOMY;  Surgeon: Krystal CHRISTELLA Spinner, MD;  Location: [facility];  Service: General;  Laterality: N/A;   cyst removed Left    wristganglion   CYSTOSCOPY WITH STENT PLACEMENT Left  06/18/2024   Procedure: CYSTOSCOPY, WITH STENT INSERTION;  Surgeon: Renda Glance, MD;  Location: [facility];  Service: Urology;  Laterality: Left;   ELBOW SURGERY Left    HARDWARE REMOVAL Left    ankle   KNEE ARTHROSCOPY Right 06/30/2016   Procedure: ARTHROSCOPY RIGHT KNEE WITH MEDIAL AND LATERAL MENSICAL DEBRIDEMENT;  Surgeon: Dempsey Moan, MD;  Location: [facility];  Service: Orthopedics;  Laterality: Right;  LMA   LEG SURGERY Left 1980's   broke leg and ankle   SHOULDER ARTHROSCOPY Bilateral    TONSILLECTOMY  1970's   TUBAL LIGATION  1970's    Allergies:  Allergies  Allergen Reactions   Actos [Pioglitazone] Other (See Comments)    Unknown reaction   Codeine Nausea And Vomiting   Ms Contin  [Morphine ] Other (See Comments)    Altered mental  state Somnolence   Neurontin  [Gabapentin ] Other (See Comments)    Confusion Altered mental state   Ozempic (0.25 Or 0.5 Mg-Dose) [Semaglutide(0.25 Or 0.5mg -Dos)] Nausea And Vomiting   Tape Rash    Medications:   Prior to Admission medications   Medication Sig Start Date End Date Taking? Authorizing Provider  acetaminophen  (TYLENOL ) 650 MG CR tablet Take 1,300 mg by mouth every 8 (eight) hours as needed for pain.   Yes [provider]  amLODipine  (NORVASC ) 2.5 MG tablet Take 1 tablet (2.5 mg total) by mouth daily. 07/28/23 07/27/24 Yes Croitoru, Mihai, MD  aspirin  81 MG tablet Take 81 mg by mouth daily.   Yes [provider]  buPROPion  (WELLBUTRIN  XL) 150 MG 24 hr tablet Take 150 mg by mouth daily.   Yes [provider]  Cholecalciferol (VITAMIN D-3 PO) Take 1 capsule by mouth daily.   Yes [provider]  furosemide  (LASIX ) 80 MG tablet TAKE 1 TABLET BY MOUTH EVERY DAY 10/30/19  Yes Meng, Hao, PA  insulin  NPH-regular Human (NOVOLIN 70/30) (70-30) 100 UNIT/ML injection Inject 10-20 Units into the skin See admin instructions. Inject 10-20 units into the skin with breakfast, inject 15 units with lunch if BS > 110 (otherwise no insulin ) and inject 20 units with dinner.   Yes [provider]  levothyroxine  (SYNTHROID ) 75 MCG tablet Take 75 mcg by mouth daily before breakfast.   Yes [provider]  olmesartan  (BENICAR ) 40 MG tablet TAKE 1 TABLET BY MOUTH EVERY DAY 11/19/19  Yes Croitoru, Mihai, MD  omeprazole (PRILOSEC) 40 MG capsule Take 40 mg by mouth 2 (two) times daily. 07/18/13  Yes [provider]  ondansetron  (ZOFRAN ) 8 MG tablet Take 8 mg by mouth daily as needed for nausea or vomiting.   Yes [provider]  rosuvastatin  (CRESTOR ) 40 MG tablet Take 40 mg by mouth daily.   Yes [provider]  sertraline  (ZOLOFT ) 100 MG tablet Take 100 mg by mouth daily.   Yes [provider]  tamsulosin  (FLOMAX ) 0.4 MG  CAPS capsule Take 1 capsule (0.4 mg total) by mouth daily after supper. Patient taking differently: Take 0.4 mg by mouth daily. 06/15/24  Yes Emil Share, DO  tirzepatide (MOUNJARO) 5 MG/0.5ML Pen Inject 5 mg into the skin every Monday.   Yes [provider]  morphine  (MSIR) 15 MG tablet Take 0.5 tablets (7.5 mg total) by mouth every 4 (four) hours as needed for severe pain (pain score 7-10). Patient not taking: Reported on 06/18/2024 06/15/24   Emil Share, DO  ondansetron  (ZOFRAN -ODT) 4 MG disintegrating tablet 4mg  ODT q4 hours prn nausea/vomit Patient not taking: Reported on 06/18/2024  06/15/24   Emil Share, DO    Inpatient medications:  aspirin  EC  81 mg Oral Daily   buPROPion   150 mg Oral q morning   Chlorhexidine  Gluconate Cloth  6 each Topical Daily   gabapentin   100 mg Oral QHS   insulin  aspart  0-20 Units Subcutaneous TID WC   insulin  aspart  0-5 Units Subcutaneous QHS   insulin  glargine-yfgn  12 Units Subcutaneous Daily   levothyroxine   75 mcg Oral Q0600   pantoprazole   40 mg Oral Daily   potassium chloride   20 mEq Oral Once   rosuvastatin   10 mg Oral Q breakfast   sertraline   100 mg Oral Daily   tamsulosin   0.4 mg Oral QPC supper    Discontinued Meds:   Medications Discontinued During This Encounter  Medication Reason   lactated ringers  infusion    acetaminophen  (OFIRMEV ) IV 1,000 mg    sterile water  for irrigation Patient Discharge   lactated ringers  infusion Patient Transfer   oxyCODONE  (Oxy IR/ROXICODONE ) immediate release tablet 5 mg Patient Transfer   oxyCODONE  (ROXICODONE ) 5 MG/5ML solution 5 mg Patient Transfer   fentaNYL  (SUBLIMAZE ) injection 25-50 mcg Patient Transfer   acetaminophen  (OFIRMEV ) IV 1,000 mg Patient Transfer   ondansetron  (ZOFRAN ) injection 4 mg Patient Transfer   cholecalciferol (VITAMIN D) 1000 units tablet Duplicate   Continuous Glucose Sensor (FREESTYLE LIBRE 14 DAY SENSOR) MISC    gabapentin  (NEURONTIN ) 100 MG capsule Side effect (s)    glucose blood (ONETOUCH ULTRA TEST) test strip    Insulin  Pen Needle (BD PEN NEEDLE NANO 2ND GEN) 32G X 4 MM MISC    rosuvastatin  (CRESTOR ) 20 MG tablet Dose change   acetaminophen  (TYLENOL ) 500 MG tablet Patient Preference   insulin  aspart (novoLOG ) injection 0-20 Units    cefTRIAXone  (ROCEPHIN ) 2 g in sodium chloride  0.9 % 100 mL IVPB    amLODipine  (NORVASC ) tablet 2.5 mg     Social History:  reports that she has never smoked. She has never used smokeless tobacco. She reports that she does not drink alcohol and does not use drugs.  Family History:   Family History  Problem Relation Age of Onset   Diabetes Mother    Heart disease Mother        CABG age 1s   Cancer Father        throat and lung   Heart disease Father    Cancer Paternal Aunt        breast   Breast cancer Paternal Aunt    Cancer Paternal Aunt        colon    Pertinent items are noted in HPI. Weight change:   Intake/Output Summary (Last 24 hours) at 06/21/2024 1044 Last data filed at 06/21/2024 0600 Gross per 24 hour  Intake 360 ml  Output 400 ml  Net -40 ml   BP (!) 107/53 (BP Location: Left Arm)   Pulse 81   Temp 97.9 F (36.6 C)   Resp 16   Ht 5' 6 (1.676 m)   Wt 114.7 kg   SpO2 95%   BMI 40.81 kg/m  Vitals:   06/20/24 2152 06/20/24 2201 06/21/24 0429 06/21/24 0500  BP:  124/66 (!) 107/53   Pulse:  83 81   Resp:  16 16   Temp:  97.7 F (36.5 C) 97.9 F (36.6 C)   TempSrc:      SpO2:  97% 95%   Weight: 114.7 kg   114.7 kg  Height:  General appearance: alert, cooperative, no distress, and moderately obese Head: Normocephalic, without obvious abnormality, atraumatic Resp: clear to auscultation bilaterally Cardio: regular rate and rhythm, S1, S2 normal, no murmur, click, rub or gallop GI: soft, non-tender; bowel sounds normal; no masses,  no organomegaly Extremities: extremities normal, atraumatic, no cyanosis or edema  Labs: Basic Metabolic Panel: Recent Labs  Lab  06/18/24 0201 06/19/24 0304 06/20/24 0305 06/21/24 0512  NA 130* 132* 130* 132*  K 3.5 3.4* 4.1 3.1*  CL 88* 96* 96* 99  CO2 21* 23 20* 20*  GLUCOSE 302* 208* 225* 120*  BUN 44* 61* 70* 76*  CREATININE 3.61* 4.03* 4.62* 5.24*  ALBUMIN 3.2* 2.2*  --   --   CALCIUM  8.7* 8.0* 7.7* 7.7*  PHOS  --  4.6 4.8*  --    Liver Function Tests: Recent Labs  Lab 06/18/24 0201 06/19/24 0304  AST 209* 261*  ALT 49* 73*  ALKPHOS 81 64  BILITOT 0.8 0.4  PROT 6.8 6.0*  ALBUMIN 3.2* 2.2*   No results for input(s): LIPASE, AMYLASE in the last 168 hours. No results for input(s): AMMONIA in the last 168 hours. CBC: Recent Labs  Lab 06/18/24 0201 06/19/24 0304 06/20/24 0305 06/21/24 0512  WBC 14.8* 13.2* 13.9* 10.2  NEUTROABS 12.7*  --   --   --   HGB 12.0 11.3* 11.2* 10.2*  HCT 36.2 35.6* 35.1* 32.0*  MCV 91.4 94.4 93.6 92.0  PLT 159 123* 117* 128*   PT/INR: @LABRCNTIP (inr:5) Cardiac Enzymes: )No results for input(s): CKTOTAL, CKMB, CKMBINDEX, TROPONINI in the last 168 hours. CBG: Recent Labs  Lab 06/20/24 0751 06/20/24 1136 06/20/24 1614 06/20/24 2155 06/21/24 0745  GLUCAP 201* 184* 182* 146* 127*    Iron Studies: No results for input(s): IRON, TIBC, TRANSFERRIN, FERRITIN in the last 168 hours.  Xrays/Other Studies: No results found.   Assessment/Plan:  AKI - multifactorial with ischemic ATN in setting of urosepsis, volume depletion, and concomitant ARB, as well as left sided hydronephrosis.  Despite stent placement, BUN/Cr have continued to climb, however her blood pressure has also been low and required stopping several agents.  Agree with renal US  to evaluate for proper stent placement and resolution of hydronephrosis.  Continue to hold olmesartan  and furosemide .  It may take days to weeks before her renal function returns to baseline.  Platelet count is dropping but doubt she has HUS from E. Coli but will start workup.  Continue with supportive  care for now and follow up on renal US .  No urgent indication for RRT at this time.  She does have some anorexia but otherwise is without uremic symptoms.  Renal dose meds for eGFR <15.   Avoid nephrotoxic medications including NSAIDs and iodinated intravenous contrast exposure unless the latter is absolutely indicated.   Preferred narcotic agents for pain control are hydromorphone , fentanyl , and methadone. Morphine  should not be used.  Avoid Baclofen and avoid oral sodium phosphate  and magnesium citrate based laxatives / bowel preps.  Continue strict Input and Output monitoring. Will monitor the patient closely with you and intervene or adjust therapy as indicated by changes in clinical status/labs  E. Coli urosepsis - off of pressors and switched to ancef  given sensitivities. Urolithiasis with hydronephrosis of left kidney - s/p cystoscopy and left ureteral stent placement on 06/18/24.  Renal US  as per above. DM type 2 - per primary svc Hyponatremia - likely due to AKI and impaired free water  handling.  Slowly improving.  Continue to follow.  OSA - per primary HTN - bp's have been low and all meds have been held for now.   Fairy LABOR Veroncia Jezek 06/21/2024, 10:44 AM

## 2024-06-21 NOTE — Plan of Care (Signed)
 No acute events overnight. Problem: Education: Goal: Knowledge of General Education information will improve Description: Including pain rating scale, medication(s)/side effects and non-pharmacologic comfort measures Outcome: Progressing   Problem: Health Behavior/Discharge Planning: Goal: Ability to manage health-related needs will improve Outcome: Progressing   Problem: Clinical Measurements: Goal: Ability to maintain clinical measurements within normal limits will improve Outcome: Progressing Goal: Will remain free from infection Outcome: Progressing Goal: Diagnostic test results will improve Outcome: Progressing Goal: Respiratory complications will improve Outcome: Progressing Goal: Cardiovascular complication will be avoided Outcome: Progressing   Problem: Activity: Goal: Risk for activity intolerance will decrease Outcome: Progressing   Problem: Nutrition: Goal: Adequate nutrition will be maintained Outcome: Progressing   Problem: Coping: Goal: Level of anxiety will decrease Outcome: Progressing   Problem: Elimination: Goal: Will not experience complications related to bowel motility Outcome: Progressing Goal: Will not experience complications related to urinary retention Outcome: Progressing   Problem: Pain Managment: Goal: General experience of comfort will improve and/or be controlled Outcome: Progressing   Problem: Safety: Goal: Ability to remain free from injury will improve Outcome: Progressing   Problem: Skin Integrity: Goal: Risk for impaired skin integrity will decrease Outcome: Progressing   Problem: Education: Goal: Ability to describe self-care measures that may prevent or decrease complications (Diabetes Survival Skills Education) will improve Outcome: Progressing Goal: Individualized Educational Video(s) Outcome: Progressing   Problem: Coping: Goal: Ability to adjust to condition or change in health will improve Outcome: Progressing    Problem: Fluid Volume: Goal: Ability to maintain a balanced intake and output will improve Outcome: Progressing   Problem: Health Behavior/Discharge Planning: Goal: Ability to identify and utilize available resources and services will improve Outcome: Progressing Goal: Ability to manage health-related needs will improve Outcome: Progressing   Problem: Metabolic: Goal: Ability to maintain appropriate glucose levels will improve Outcome: Progressing   Problem: Nutritional: Goal: Maintenance of adequate nutrition will improve Outcome: Progressing Goal: Progress toward achieving an optimal weight will improve Outcome: Progressing   Problem: Skin Integrity: Goal: Risk for impaired skin integrity will decrease Outcome: Progressing   Problem: Tissue Perfusion: Goal: Adequacy of tissue perfusion will improve Outcome: Progressing

## 2024-06-21 NOTE — Progress Notes (Signed)
 PROGRESS NOTE  Sue Davidson FMW:[id number] DOB: Mar 13, 1951 DOA: 06/18/2024 PCP: Clarice Nottingham, MD   LOS: 3 days   Brief narrative: Sue Davidson is a 73 y.o. female with medical history significant of anemia,  anxiety, depression, bilateral carotid arterial disease, type 2 diabetes, class II obesity, GERD, grade 1 diastolic dysfunction,  pulmonary artery hypertension, hyperlipidemia,  sleep apnea,  history of urolithiasis presented to the ED with left flank pain.  In the ED patient was febrile tachycardic tachypneic, hypotensive and required supplemental oxygen.  Urinalysis showed abnormal urinalysis with more than 50 white cells.  COVID influenza and RSV was negative.  CBC showed elevated WBC at 14.8 with 85% neutrophils.  Lactic acid was initially elevated at 5.9 followed by 4.6.  CMP showed hyponatremia with sodium of 130 and creatinine elevation at 3.6 from baseline 0.8.  AST and ALT mildly elevated.  CT renal study showed left hydroureteronephrosis with 7 mm calculus in the proximal left ureter.  She had multiple falls at home after having generalized weakness.    Sepsis protocol ordered and urology was consulted.  Patient was given IV Rocephin  and vasopressors initially.  Patient was then seen by urology and underwent cystoscopy with left ureteral stent placement.      Assessment/Plan:  Sepsis secondary to  Acute UTI (urinary tract infection) E. coli bacteremia. Urolithiasis with  Hydronephrosis of left kidney Seen by urology and patient underwent cystoscopy with left ureteral stent placement on 06/18/2024.   Blood cultures growing E. coli.  Sensitivities reviewed.  Change over from ceftriaxone  to cefazolin .  Hopefully transition to oral antibiotics once her renal function starts improving. Remains afebrile.  WBC noted to be normal today.  Continue to monitor.  AKI (acute kidney injury)  Creatinine was 0.84 in 2024 and she came in with a creatinine of 3.61. Likely multifactorial in a  combination of hypotension, use of medications, sepsis, poor oral intake and obstructive pathology since she was not eating and drinking for several days prior to presentation.    Holding ARB and ACE inhibitors, diuretics.   Creatinine continues to worsen.  She is producing about 400 to 500 mL of urine every day.  Feels well otherwise.  Potassium is 3.1 today.  Hopefully renal function will plateau soon.  W will discuss this with nephrology as well. Will discontinue her amlodipine  since she has soft blood pressures. Continue to monitor closely.  Diabetes mellitus type 2, uncontrolled with hyperglycemia HbA1c 8.0. Patient is on 70/30 insulin  at home.  Continue glargine and SSI.  Hypokalemia/Hyponatremia Potassium noted to be low today.  Will be supplemented.   Depression  Continue bupropion  and sertraline .   Hypothyroidism  Continue Synthroid .   OSA (obstructive sleep apnea) Still working to obtain Emogene. Declined to use a hospital CPAP device.  Currently on supplemental oxygen by nasal cannula.   Essential hypertension Off of her medications due to soft blood pressures.   Dyslipidemia Continue Crestor    Gastroesophageal reflux disease Continue PPI   Class 2 obesity Current BMI 38.39 kg/m.  Would benefit from lifestyle modification.  DVT prophylaxis: SCDs Start: 06/18/24 9375 Disposition: Likely home, in 2 to 3 days pending clinical improvement. Code Status:    Code Status: Full Code Family Communication: None at bedside  Consultants: Urology  Procedures: Cystoscopy with left ureteric stent placement on 06/18/2024   Subjective: Patient feels well.  Wants to go home.  Understands the reason why she has to stay in the hospital.  Denies any complaints.  Objective:  Vitals:   06/20/24 2201 06/21/24 0429  BP: 124/66 (!) 107/53  Pulse: 83 81  Resp: 16 16  Temp: 97.7 F (36.5 C) 97.9 F (36.6 C)  SpO2: 97% 95%    Intake/Output Summary (Last 24 hours) at 06/21/2024  1032 Last data filed at 06/21/2024 0600 Gross per 24 hour  Intake 360 ml  Output 400 ml  Net -40 ml   Filed Weights   06/18/24 1059 06/20/24 2152 06/21/24 0500  Weight: 107.9 kg 114.7 kg 114.7 kg   Body mass index is 40.81 kg/m.   Physical Exam:  General appearance: Awake alert.  In no distress Resp: Clear to auscultation bilaterally.  Normal effort Cardio: S1-S2 is normal regular.  No S3-S4.  No rubs murmurs or bruit GI: Abdomen is soft.  Nontender nondistended.  Bowel sounds are present normal.  No masses organomegaly Extremities: No edema.  Full range of motion of lower extremities. Neurologic: Alert and oriented x3.  No focal neurological deficits.     Data Review: I have personally reviewed the following laboratory data and studies,  CBC: Recent Labs  Lab 06/18/24 0201 06/19/24 0304 06/20/24 0305 06/21/24 0512  WBC 14.8* 13.2* 13.9* 10.2  NEUTROABS 12.7*  --   --   --   HGB 12.0 11.3* 11.2* 10.2*  HCT 36.2 35.6* 35.1* 32.0*  MCV 91.4 94.4 93.6 92.0  PLT 159 123* 117* 128*   Basic Metabolic Panel: Recent Labs  Lab 06/18/24 0201 06/19/24 0304 06/20/24 0305 06/21/24 0512  NA 130* 132* 130* 132*  K 3.5 3.4* 4.1 3.1*  CL 88* 96* 96* 99  CO2 21* 23 20* 20*  GLUCOSE 302* 208* 225* 120*  BUN 44* 61* 70* 76*  CREATININE 3.61* 4.03* 4.62* 5.24*  CALCIUM  8.7* 8.0* 7.7* 7.7*  MG  --  2.0 2.1  --   PHOS  --  4.6 4.8*  --    Liver Function Tests: Recent Labs  Lab 06/18/24 0201 06/19/24 0304  AST 209* 261*  ALT 49* 73*  ALKPHOS 81 64  BILITOT 0.8 0.4  PROT 6.8 6.0*  ALBUMIN 3.2* 2.2*   CBG: Recent Labs  Lab 06/20/24 0751 06/20/24 1136 06/20/24 1614 06/20/24 2155 06/21/24 0745  GLUCAP 201* 184* 182* 146* 127*   Recent Results (from the past 240 hours)  Blood Culture (routine x 2)     Status: Abnormal   Collection Time: 06/18/24  2:00 AM   Specimen: BLOOD  Result Value Ref Range Status   Specimen Description   Final    BLOOD RIGHT  ANTECUBITAL Performed at [facility], [address] [place], [place] [address]    Special Requests   Final    BOTTLES DRAWN AEROBIC AND ANAEROBIC Blood Culture adequate volume Performed at [facility], [address] [place], [place] [address]    Culture  Setup Time   Final    GRAM NEGATIVE RODS IN BOTH AEROBIC AND ANAEROBIC BOTTLES CRITICAL VALUE NOTED.  VALUE IS CONSISTENT WITH PREVIOUSLY REPORTED AND CALLED VALUE.    Culture (A)  Final    ESCHERICHIA COLI SUSCEPTIBILITIES PERFORMED ON PREVIOUS CULTURE WITHIN THE LAST 5 DAYS. Performed at [facility], [address] [place], [place] [address]    Report Status 06/21/2024 FINAL  Final  Blood Culture (routine x 2)     Status: Abnormal   Collection Time: 06/18/24  2:10 AM   Specimen: BLOOD LEFT WRIST  Result Value Ref Range Status  Specimen Description   Final    BLOOD LEFT WRIST Performed at [facility], [address] [place], [place] [address]    Special Requests   Final    BOTTLES DRAWN AEROBIC AND ANAEROBIC Blood Culture adequate volume Performed at [facility], [address] [place], [place] [address]    Culture  Setup Time   Final    GRAM NEGATIVE RODS IN BOTH AEROBIC AND ANAEROBIC BOTTLES CRITICAL RESULT CALLED TO, READ BACK BY AND VERIFIED WITH: PHARMD L POINTDEXTER 06/19/2024 @ 0022 BY AB Performed at [facility], [address], [place], [place] [address]    Culture ESCHERICHIA COLI (A)  Final   Report Status 06/20/2024 FINAL  Final   Organism ID, Bacteria ESCHERICHIA COLI  Final      Susceptibility   Escherichia coli - MIC*    AMPICILLIN 4 SENSITIVE Sensitive     CEFAZOLIN  Value in next row Sensitive      <=1 SENSITIVEThis is a modified FDA-approved test that has been validated and its performance characteristics determined by the reporting laboratory.  This laboratory is certified under the Clinical Laboratory Improvement Amendments CLIA as  qualified to perform high complexity clinical laboratory testing.    CEFEPIME Value in next row Sensitive      <=1 SENSITIVEThis is a modified FDA-approved test that has been validated and its performance characteristics determined by the reporting laboratory.  This laboratory is certified under the Clinical Laboratory Improvement Amendments CLIA as qualified to perform high complexity clinical laboratory testing.    ERTAPENEM Value in next row Sensitive      <=1 SENSITIVEThis is a modified FDA-approved test that has been validated and its performance characteristics determined by the reporting laboratory.  This laboratory is certified under the Clinical Laboratory Improvement Amendments CLIA as qualified to perform high complexity clinical laboratory testing.    CEFTRIAXONE  Value in next row Sensitive      <=1 SENSITIVEThis is a modified FDA-approved test that has been validated and its performance characteristics determined by the reporting laboratory.  This laboratory is certified under the Clinical Laboratory Improvement Amendments CLIA as qualified to perform high complexity clinical laboratory testing.    CIPROFLOXACIN Value in next row Sensitive      <=1 SENSITIVEThis is a modified FDA-approved test that has been validated and its performance characteristics determined by the reporting laboratory.  This laboratory is certified under the Clinical Laboratory Improvement Amendments CLIA as qualified to perform high complexity clinical laboratory testing.    GENTAMICIN Value in next row Sensitive      <=1 SENSITIVEThis is a modified FDA-approved test that has been validated and its performance characteristics determined by the reporting laboratory.  This laboratory is certified under the Clinical Laboratory Improvement Amendments CLIA as qualified to perform high complexity clinical laboratory testing.    MEROPENEM Value in next row Sensitive      <=1 SENSITIVEThis is a modified FDA-approved test that  has been validated and its performance characteristics determined by the reporting laboratory.  This laboratory is certified under the Clinical Laboratory Improvement Amendments CLIA as qualified to perform high complexity clinical laboratory testing.    TRIMETH/SULFA Value in next row Sensitive      <=1 SENSITIVEThis is a modified FDA-approved test that has been validated and its performance characteristics determined by the reporting laboratory.  This laboratory is certified under the Clinical Laboratory Improvement Amendments CLIA as qualified to perform high complexity clinical laboratory testing.  AMPICILLIN/SULBACTAM Value in next row Sensitive      <=1 SENSITIVEThis is a modified FDA-approved test that has been validated and its performance characteristics determined by the reporting laboratory.  This laboratory is certified under the Clinical Laboratory Improvement Amendments CLIA as qualified to perform high complexity clinical laboratory testing.    PIP/TAZO Value in next row Sensitive ug/mL     <=4 SENSITIVEThis is a modified FDA-approved test that has been validated and its performance characteristics determined by the reporting laboratory.  This laboratory is certified under the Clinical Laboratory Improvement Amendments CLIA as qualified to perform high complexity clinical laboratory testing.    * ESCHERICHIA COLI  Blood Culture ID Panel (Reflexed)     Status: Abnormal   Collection Time: 06/18/24  2:10 AM  Result Value Ref Range Status   Enterococcus faecalis NOT DETECTED NOT DETECTED Final   Enterococcus Faecium NOT DETECTED NOT DETECTED Final   Listeria monocytogenes NOT DETECTED NOT DETECTED Final   Staphylococcus species NOT DETECTED NOT DETECTED Final   Staphylococcus aureus (BCID) NOT DETECTED NOT DETECTED Final   Staphylococcus epidermidis NOT DETECTED NOT DETECTED Final   Staphylococcus lugdunensis NOT DETECTED NOT DETECTED Final   Streptococcus species NOT DETECTED NOT  DETECTED Final   Streptococcus agalactiae NOT DETECTED NOT DETECTED Final   Streptococcus pneumoniae NOT DETECTED NOT DETECTED Final   Streptococcus pyogenes NOT DETECTED NOT DETECTED Final   A.calcoaceticus-baumannii NOT DETECTED NOT DETECTED Final   Bacteroides fragilis NOT DETECTED NOT DETECTED Final   Enterobacterales DETECTED (A) NOT DETECTED Final    Comment: Enterobacterales represent a large order of gram negative bacteria, not a single organism. CRITICAL RESULT CALLED TO, READ BACK BY AND VERIFIED WITH: PHARMD L POINTDEXTER 06/19/2024 @ 0022 BY AB    Enterobacter cloacae complex NOT DETECTED NOT DETECTED Final   Escherichia coli DETECTED (A) NOT DETECTED Final    Comment: CRITICAL RESULT CALLED TO, READ BACK BY AND VERIFIED WITH: PHARMD L POINTDEXTER 06/19/2024 @ 0022 BY AB    Klebsiella aerogenes NOT DETECTED NOT DETECTED Final   Klebsiella oxytoca NOT DETECTED NOT DETECTED Final   Klebsiella pneumoniae NOT DETECTED NOT DETECTED Final   Proteus species NOT DETECTED NOT DETECTED Final   Salmonella species NOT DETECTED NOT DETECTED Final   Serratia marcescens NOT DETECTED NOT DETECTED Final   Haemophilus influenzae NOT DETECTED NOT DETECTED Final   Neisseria meningitidis NOT DETECTED NOT DETECTED Final   Pseudomonas aeruginosa NOT DETECTED NOT DETECTED Final   Stenotrophomonas maltophilia NOT DETECTED NOT DETECTED Final   Candida albicans NOT DETECTED NOT DETECTED Final   Candida auris NOT DETECTED NOT DETECTED Final   Candida glabrata NOT DETECTED NOT DETECTED Final   Candida krusei NOT DETECTED NOT DETECTED Final   Candida parapsilosis NOT DETECTED NOT DETECTED Final   Candida tropicalis NOT DETECTED NOT DETECTED Final   Cryptococcus neoformans/gattii NOT DETECTED NOT DETECTED Final   CTX-M ESBL NOT DETECTED NOT DETECTED Final   Carbapenem resistance IMP NOT DETECTED NOT DETECTED Final   Carbapenem resistance KPC NOT DETECTED NOT DETECTED Final   Carbapenem resistance NDM  NOT DETECTED NOT DETECTED Final   Carbapenem resist OXA 48 LIKE NOT DETECTED NOT DETECTED Final   Carbapenem resistance VIM NOT DETECTED NOT DETECTED Final    Comment: Performed at [facility], [address] [place], [place] [address]  Resp panel by RT-PCR (RSV, Flu A&B, Covid) Anterior Nasal Swab     Status: None   Collection Time: 06/18/24  3:11  AM   Specimen: Anterior Nasal Swab  Result Value Ref Range Status   SARS Coronavirus 2 by RT PCR NEGATIVE NEGATIVE Final    Comment: (NOTE) SARS-CoV-2 target nucleic acids are NOT DETECTED.  The SARS-CoV-2 RNA is generally detectable in upper respiratory specimens during the acute phase of infection. The lowest concentration of SARS-CoV-2 viral copies this assay can detect is 138 copies/mL. A negative result does not preclude SARS-Cov-2 infection and should not be used as the sole basis for treatment or other patient management decisions. A negative result may occur with  improper specimen collection/handling, submission of specimen other than nasopharyngeal swab, presence of viral mutation(s) within the areas targeted by this assay, and inadequate number of viral copies(<138 copies/mL). A negative result must be combined with clinical observations, patient history, and epidemiological information. The expected result is Negative.  Fact Sheet for Patients:  BloggerCourse.com  Fact Sheet for Healthcare Providers:  SeriousBroker.it  This test is no t yet approved or cleared by the [place]  FDA and  has been authorized for detection and/or diagnosis of SARS-CoV-2 by FDA under an Emergency Use Authorization (EUA). This EUA will remain  in effect (meaning this test can be used) for the duration of the COVID-19 declaration under Section 564(b)(1) of the Act, 21 U.S.C.section 360bbb-3(b)(1), unless the authorization is terminated  or revoked sooner.       Influenza A by PCR  NEGATIVE NEGATIVE Final   Influenza B by PCR NEGATIVE NEGATIVE Final    Comment: (NOTE) The Xpert Xpress SARS-CoV-2/FLU/RSV plus assay is intended as an aid in the diagnosis of influenza from Nasopharyngeal swab specimens and should not be used as a sole basis for treatment. Nasal washings and aspirates are unacceptable for Xpert Xpress SARS-CoV-2/FLU/RSV testing.  Fact Sheet for Patients: BloggerCourse.com  Fact Sheet for Healthcare Providers: SeriousBroker.it  This test is not yet approved or cleared by the [place]  FDA and has been authorized for detection and/or diagnosis of SARS-CoV-2 by FDA under an Emergency Use Authorization (EUA). This EUA will remain in effect (meaning this test can be used) for the duration of the COVID-19 declaration under Section 564(b)(1) of the Act, 21 U.S.C. section 360bbb-3(b)(1), unless the authorization is terminated or revoked.     Resp Syncytial Virus by PCR NEGATIVE NEGATIVE Final    Comment: (NOTE) Fact Sheet for Patients: BloggerCourse.com  Fact Sheet for Healthcare Providers: SeriousBroker.it  This test is not yet approved or cleared by the [place]  FDA and has been authorized for detection and/or diagnosis of SARS-CoV-2 by FDA under an Emergency Use Authorization (EUA). This EUA will remain in effect (meaning this test can be used) for the duration of the COVID-19 declaration under Section 564(b)(1) of the Act, 21 U.S.C. section 360bbb-3(b)(1), unless the authorization is terminated or revoked.  Performed at [facility], [address] [place], [place] [address]   Urine Culture     Status: Abnormal   Collection Time: 06/18/24  7:24 AM   Specimen: Urine, Cystoscope  Result Value Ref Range Status   Specimen Description   Final    CYSTOSCOPY URINE CYSTOSCOPE, PT ON CEFTRIAXONE  Performed at [facility], [address] [place], [place] [address]    Special Requests   Final    NONE Performed at [facility], [address], [place], [place] [address]    Culture 20,000 COLONIES/mL ESCHERICHIA COLI (A)  Final   Report Status 06/20/2024 FINAL  Final  Organism ID, Bacteria ESCHERICHIA COLI (A)  Final      Susceptibility   Escherichia coli - MIC*    AMPICILLIN 4 SENSITIVE Sensitive     CEFAZOLIN  Value in next row Sensitive      <=1 SENSITIVEThis is a modified FDA-approved test that has been validated and its performance characteristics determined by the reporting laboratory.  This laboratory is certified under the Clinical Laboratory Improvement Amendments CLIA as qualified to perform high complexity clinical laboratory testing.    CEFEPIME Value in next row Sensitive      <=1 SENSITIVEThis is a modified FDA-approved test that has been validated and its performance characteristics determined by the reporting laboratory.  This laboratory is certified under the Clinical Laboratory Improvement Amendments CLIA as qualified to perform high complexity clinical laboratory testing.    ERTAPENEM Value in next row Sensitive      <=1 SENSITIVEThis is a modified FDA-approved test that has been validated and its performance characteristics determined by the reporting laboratory.  This laboratory is certified under the Clinical Laboratory Improvement Amendments CLIA as qualified to perform high complexity clinical laboratory testing.    CEFTRIAXONE  Value in next row Sensitive      <=1 SENSITIVEThis is a modified FDA-approved test that has been validated and its performance characteristics determined by the reporting laboratory.  This laboratory is certified under the Clinical Laboratory Improvement Amendments CLIA as qualified to perform high complexity clinical laboratory testing.    CIPROFLOXACIN Value in next row Sensitive      <=1 SENSITIVEThis is a modified  FDA-approved test that has been validated and its performance characteristics determined by the reporting laboratory.  This laboratory is certified under the Clinical Laboratory Improvement Amendments CLIA as qualified to perform high complexity clinical laboratory testing.    GENTAMICIN Value in next row Sensitive      <=1 SENSITIVEThis is a modified FDA-approved test that has been validated and its performance characteristics determined by the reporting laboratory.  This laboratory is certified under the Clinical Laboratory Improvement Amendments CLIA as qualified to perform high complexity clinical laboratory testing.    MEROPENEM Value in next row Sensitive      <=1 SENSITIVEThis is a modified FDA-approved test that has been validated and its performance characteristics determined by the reporting laboratory.  This laboratory is certified under the Clinical Laboratory Improvement Amendments CLIA as qualified to perform high complexity clinical laboratory testing.    TRIMETH/SULFA Value in next row Sensitive      <=1 SENSITIVEThis is a modified FDA-approved test that has been validated and its performance characteristics determined by the reporting laboratory.  This laboratory is certified under the Clinical Laboratory Improvement Amendments CLIA as qualified to perform high complexity clinical laboratory testing.    AMPICILLIN/SULBACTAM Value in next row Sensitive      <=1 SENSITIVEThis is a modified FDA-approved test that has been validated and its performance characteristics determined by the reporting laboratory.  This laboratory is certified under the Clinical Laboratory Improvement Amendments CLIA as qualified to perform high complexity clinical laboratory testing.    PIP/TAZO Value in next row Sensitive ug/mL     <=4 SENSITIVEThis is a modified FDA-approved test that has been validated and its performance characteristics determined by the reporting laboratory.  This laboratory is certified under  the Clinical Laboratory Improvement Amendments CLIA as qualified to perform high complexity clinical laboratory testing.    * 20,000 COLONIES/mL ESCHERICHIA COLI  MRSA Next Gen by PCR, Nasal     Status:  None   Collection Time: 06/18/24 10:53 AM   Specimen: Nasal Mucosa; Nasal Swab  Result Value Ref Range Status   MRSA by PCR Next Gen NOT DETECTED NOT DETECTED Final    Comment: (NOTE) The GeneXpert MRSA Assay (FDA approved for NASAL specimens only), is one component of a comprehensive MRSA colonization surveillance program. It is not intended to diagnose MRSA infection nor to guide or monitor treatment for MRSA infections. Test performance is not FDA approved in patients less than 40 years old. Performed at [facility], [address], [place], [place] [address]      Studies: No results found.   Joette Pebbles, MD  [facility] 06/21/2024  If 7PM-7AM, please contact night-coverage

## 2024-06-21 NOTE — TOC Initial Note (Signed)
 Transition of Care [facility]) - Initial/Assessment Note    Patient Details  Name: Sue Davidson MRN: [id number] Date of Birth: [birth year]  Transition of Care [facility]) CM/SW Contact:    Sonda Manuella Quill, RN Phone Number: 06/21/2024, 5:06 PM  Clinical Narrative:                 TOC for d/c planning; spoke w/ pt and sister Tammy at bedside; pt said she shares apt w/ her dtr Donny Goods; she plans to return at d/c;  her POCs are Tammy Nix (sister) [phone] / Donny Goods (dtr) [phone]; family will provide transportation; she verified insurance/PCP; pt said she has difficulty paying for food; she denied other SDOH risks; pt has cane walker, shower chair, and BSC; pt also has glasses, dentures (upper/lower); pt declined recc SNF; she agreed to receiving [facility] services; pt will discuss w/ dtr; will pass on to oncoming TOC for follow up.  Expected Discharge Plan: Home/Self Care Barriers to Discharge: Continued Medical Work up   Patient Goals and CMS Choice Patient states their goals for this hospitalization and ongoing recovery are:: home CMS Medicare.gov Compare Post Acute Care list provided to:: Patient   [place] ownership interest in [facility].provided to:: Patient    Expected Discharge Plan and Services   Discharge Planning Services: CM Consult   Living arrangements for the past 2 months: Apartment                                      Prior Living Arrangements/Services Living arrangements for the past 2 months: Apartment Lives with:: Adult Children Patient language and need for interpreter reviewed:: Yes Do you feel safe going back to the place where you live?: Yes      Need for Family Participation in Patient Care: Yes (Comment) Care giver support system in place?: Yes (comment) Current home services: DME (cane, walker, shower chair, BSC) Criminal Activity/Legal Involvement Pertinent to Current Situation/Hospitalization: No - Comment as  needed  Activities of Daily Living   ADL Screening (condition at time of admission) Independently performs ADLs?: Yes (appropriate for developmental age) Is the patient deaf or have difficulty hearing?: No Does the patient have difficulty seeing, even when wearing glasses/contacts?: No Does the patient have difficulty concentrating, remembering, or making decisions?: No  Permission Sought/Granted Permission sought to share information with : Case Manager Permission granted to share information with : Yes, Verbal Permission Granted  Share Information with NAME: Case Manager     Permission granted to share info w Relationship: [name] (sister) [phone] / Donny Goods (dtr) [phone]     Emotional Assessment Appearance:: Appears stated age Attitude/Demeanor/Rapport: Gracious Affect (typically observed): Accepting Orientation: : Oriented to Self, Oriented to Place, Oriented to  Time, Oriented to Situation Alcohol / Substance Use: Not Applicable Psych Involvement: No (comment)  Admission diagnosis:  Ureterolithiasis [N20.1] Hyponatremia [E87.1] Obstructive uropathy [N13.9] Acute kidney injury (nontraumatic) (HCC) [N17.9] Elevated transaminase level [R74.01] Sepsis (HCC) [A41.9] Sepsis due to undetermined organism [facility]) [A41.9] Patient Active Problem List   Diagnosis Date Noted   Sepsis (HCC) 06/18/2024   Gastroesophageal reflux disease 06/18/2024   Class 2 obesity 06/18/2024   Acute UTI (urinary tract infection) 06/18/2024   Urolithiasis 06/18/2024   Hydronephrosis of left kidney 06/18/2024   Depression 06/18/2024   Hypothyroidism 06/18/2024   CHF (congestive heart failure) (HCC) 09/24/2020   History of gout 12/20/2018  Lumbar radiculopathy 12/19/2018   Carpal tunnel syndrome 11/28/2017   Chest pain 04/14/2017   AKI (acute kidney injury) (HCC) 04/14/2017   Hyponatremia 04/14/2017   Carotid disease, bilateral (HCC) 10/06/2016   Normal coronary arteries 10/06/2016    Lateral meniscal tear 06/29/2016   Pulmonary artery hypertension (HCC) 04/23/2016   Exertional dyspnea 01/26/2016   Bilateral leg edema 01/26/2016   Morbid obesity (HCC) 01/26/2016   Dyslipidemia 01/26/2016   Chronic cholecystitis 10/25/2013   DYSPNEA 06/05/2009   Insulin  dependent diabetes mellitus (HCC) 06/04/2009   OSA (obstructive sleep apnea) 06/04/2009   Essential hypertension 06/04/2009   EDEMA LEG 06/04/2009   PCP:  Clarice Nottingham, MD Pharmacy:   [facility] [address] [place], [place] - [address] [place] [address] Phone: [phone] Fax: [phone]     Social Drivers of Health (SDOH) Social History: SDOH Screenings   Food Insecurity: Food Insecurity Present (06/21/2024)  Housing: Low Risk  (06/21/2024)  Transportation Needs: No Transportation Needs (06/21/2024)  Utilities: Not At Risk (06/21/2024)  Social Connections: Moderately Integrated (06/18/2024)  Tobacco Use: Low Risk  (06/18/2024)   SDOH Interventions: Food Insecurity Interventions: [employer] Provided, Inpatient TOC Housing Interventions: Intervention Not Indicated, Inpatient TOC Transportation Interventions: Intervention Not Indicated, Inpatient TOC Utilities Interventions: Intervention Not Indicated, Inpatient TOC   Readmission Risk Interventions    06/21/2024    5:03 PM  Readmission Risk Prevention Plan  Transportation Screening Complete  PCP or Specialist Appt within 3-5 Days Complete  HRI or Home Care Consult Complete  Social Work Consult for Recovery Care Planning/Counseling Complete  Palliative Care Screening Not Applicable  Medication Review [employer]) Complete

## 2024-06-21 NOTE — Progress Notes (Signed)
 Patient ID: Sue Davidson, female   DOB: [birth year], 73 y.o.   MRN: [id number]  3 Days Post-Op Subjective: Pt continues to feel better.  Denies pain.  Objective: Vital signs in last 24 hours: Temp:  [97.4 F (36.3 C)-97.9 F (36.6 C)] 97.9 F (36.6 C) (08/14 0429) Pulse Rate:  [75-83] 81 (08/14 0429) Resp:  [16] 16 (08/14 0429) BP: (96-124)/(53-85) 107/53 (08/14 0429) SpO2:  [95 %-99 %] 95 % (08/14 0429) Weight:  [114.7 kg] 114.7 kg (08/14 0500)  Intake/Output from previous day: 08/13 0701 - 08/14 0700 In: 631 [P.O.:360; I.V.:271] Out: 500 [Urine:500] Intake/Output this shift: No intake/output data recorded.  Physical Exam:  General: Alert and oriented   Lab Results: Recent Labs    06/19/24 0304 06/20/24 0305 06/21/24 0512  HGB 11.3* 11.2* 10.2*  HCT 35.6* 35.1* 32.0*      Latest Ref Rng & Units 06/21/2024    5:12 AM 06/20/2024    3:05 AM 06/19/2024    3:04 AM  CBC  WBC 4.0 - 10.5 K/uL 10.2  13.9  13.2   Hemoglobin 12.0 - 15.0 g/dL 89.7  88.7  88.6   Hematocrit 36.0 - 46.0 % 32.0  35.1  35.6   Platelets 150 - 400 K/uL 128  117  123      BMET Recent Labs    06/20/24 0305 06/21/24 0512  NA 130* 132*  K 4.1 3.1*  CL 96* 99  CO2 20* 20*  GLUCOSE 225* 120*  BUN 70* 76*  CREATININE 4.62* 5.24*  CALCIUM  7.7* 7.7*     Studies/Results: No results found.  Assessment/Plan: 1) Left ureteral calculus/sepsis/UTI/AKI:  Patient continues to improve from an infectious standpoint.  Continue culture specific antibiotics for 10-14 days.  Will arrange outpatient follow up and definitive stone treatment.   Renal function continues to worsen.  Will check renal ultrasound to ensure proper stent position.  Likely this is pre-renal and hopefully will begin to improve soon.  UOP improving.   LOS: 3 days   Noretta Ferrara 06/21/2024, 10:50 AM

## 2024-06-22 DIAGNOSIS — N179 Acute kidney failure, unspecified: Secondary | ICD-10-CM | POA: Diagnosis not present

## 2024-06-22 DIAGNOSIS — N201 Calculus of ureter: Secondary | ICD-10-CM | POA: Diagnosis not present

## 2024-06-22 LAB — BASIC METABOLIC PANEL WITH GFR
Anion gap: 14 (ref 5–15)
BUN: 74 mg/dL — ABNORMAL HIGH (ref 8–23)
CO2: 17 mmol/L — ABNORMAL LOW (ref 22–32)
Calcium: 7.6 mg/dL — ABNORMAL LOW (ref 8.9–10.3)
Chloride: 100 mmol/L (ref 98–111)
Creatinine, Ser: 5.79 mg/dL — ABNORMAL HIGH (ref 0.44–1.00)
GFR, Estimated: 7 mL/min — ABNORMAL LOW (ref >60)
Glucose, Bld: 92 mg/dL (ref 70–99)
Potassium: 3.2 mmol/L — ABNORMAL LOW (ref 3.5–5.1)
Sodium: 131 mmol/L — ABNORMAL LOW (ref 135–145)

## 2024-06-22 LAB — C3 COMPLEMENT: C3 Complement: 116 mg/dL (ref 82–167)

## 2024-06-22 LAB — C4 COMPLEMENT: Complement C4, Body Fluid: 33 mg/dL (ref 12–38)

## 2024-06-22 LAB — HAPTOGLOBIN: Haptoglobin: 371 mg/dL — ABNORMAL HIGH (ref 42–346)

## 2024-06-22 LAB — GLUCOSE, CAPILLARY
Glucose-Capillary: 92 mg/dL (ref 70–99)
Glucose-Capillary: 94 mg/dL (ref 70–99)
Glucose-Capillary: 145 mg/dL — ABNORMAL HIGH (ref 70–99)
Glucose-Capillary: 148 mg/dL — ABNORMAL HIGH (ref 70–99)

## 2024-06-22 MED ORDER — POTASSIUM CHLORIDE CRYS ER 20 MEQ PO TBCR
20.0000 meq | EXTENDED_RELEASE_TABLET | Freq: Once | ORAL | Status: AC
  Administered 2024-06-22: 20 meq via ORAL
  Filled 2024-06-22: qty 1

## 2024-06-22 MED ORDER — SODIUM CHLORIDE 0.9 % IV SOLN: INTRAVENOUS | Status: DC

## 2024-06-22 NOTE — Progress Notes (Signed)
 Patient ID: Sue Davidson, female   DOB: 28-Apr-1951, 73 y.o.   MRN: [id number] S: Feels better today and eating more. O:BP (!) 141/53 (BP Location: Right Arm)   Pulse 77   Temp 98.3 F (36.8 C)   Resp 18   Ht 5' 6 (1.676 m)   Wt 114.9 kg   SpO2 96%   BMI 40.89 kg/m   Intake/Output Summary (Last 24 hours) at 06/22/2024 1308 Last data filed at 06/22/2024 0533 Gross per 24 hour  Intake 1789.38 ml  Output 1000 ml  Net 789.38 ml   Intake/Output: I/O last 3 completed shifts: In: 2029.4 [P.O.:240; I.V.:1392.2; IV Piggyback:397.2] Out: 1250 [Urine:1250]  Intake/Output this shift:  No intake/output data recorded. Weight change: 0.2 kg Gen: NAD CVS: RRR Resp:CTA Abd: +BS, soft, NT/ND Ext: no edema  Recent Labs  Lab 06/18/24 0201 06/19/24 0304 06/20/24 0305 06/21/24 0512 06/22/24 0457  NA 130* 132* 130* 132* 131*  K 3.5 3.4* 4.1 3.1* 3.2*  CL 88* 96* 96* 99 100  CO2 21* 23 20* 20* 17*  GLUCOSE 302* 208* 225* 120* 92  BUN 44* 61* 70* 76* 74*  CREATININE 3.61* 4.03* 4.62* 5.24* 5.79*  ALBUMIN 3.2* 2.2*  --   --   --   CALCIUM  8.7* 8.0* 7.7* 7.7* 7.6*  PHOS  --  4.6 4.8*  --   --   AST 209* 261*  --   --   --   ALT 49* 73*  --   --   --    Liver Function Tests: Recent Labs  Lab 06/18/24 0201 06/19/24 0304  AST 209* 261*  ALT 49* 73*  ALKPHOS 81 64  BILITOT 0.8 0.4  PROT 6.8 6.0*  ALBUMIN 3.2* 2.2*   No results for input(s): LIPASE, AMYLASE in the last 168 hours. No results for input(s): AMMONIA in the last 168 hours. CBC: Recent Labs  Lab 06/18/24 0201 06/19/24 0304 06/20/24 0305 06/21/24 0512  WBC 14.8* 13.2* 13.9* 10.2  NEUTROABS 12.7*  --   --   --   HGB 12.0 11.3* 11.2* 10.2*  HCT 36.2 35.6* 35.1* 32.0*  MCV 91.4 94.4 93.6 92.0  PLT 159 123* 117* 128*   Cardiac Enzymes: No results for input(s): CKTOTAL, CKMB, CKMBINDEX, TROPONINI in the last 168 hours. CBG: Recent Labs  Lab 06/21/24 1151 06/21/24 1634 06/21/24 2042  06/22/24 0728 06/22/24 1150  GLUCAP 108* 124* 126* 92 94    Iron Studies: No results for input(s): IRON, TIBC, TRANSFERRIN, FERRITIN in the last 72 hours. Studies/Results: US  RENAL Result Date: 06/21/2024 CLINICAL DATA:  [id number] Acute kidney injury [facility]) [id number] EXAM: RENAL / URINARY TRACT ULTRASOUND COMPLETE COMPARISON:  June 18, 2024 FINDINGS: Right Kidney: Renal measurements: 12.3 x 6.3 x 6.1 cm = volume: 246 mL.Normal echogenicity. No mass. No hydronephrosis or visualized nephrolithiasis. Left Kidney: Renal measurements: 13.6 x 6.9 x 6 cm = volume: 293 mL. Normal echogenicity. No mass. No hydronephrosis or visualized nephrolithiasis. The left ureteral stent was not well visualized by ultrasound. Bladder: Completely decompressed with a urinary catheter in place. Other: None. IMPRESSION: No hydronephrosis or visualized nephrolithiasis in either kidney. Electronically Signed   By: Rogelia Myers M.D.   On: 06/21/2024 19:58   DG Pelvis Portable Result Date: 06/21/2024 CLINICAL DATA:  Pain. EXAM: PORTABLE PELVIS 1-2 VIEWS COMPARISON:  CT 06/18/2024 FINDINGS: Portions of the left ureteral stent are in place, the distal pigtail in the region of the pelvis. No definite stone or stone fragments along  the course of the stent. Bony pelvis is intact. No fracture. No evidence of erosion or focal bone abnormality. Mild degenerative change of the pubic symphysis and sacroiliac joints. Vascular calcifications are seen. IMPRESSION: Portions of the left ureteral stent are in place. No acute osseous abnormality. Electronically Signed   By: Andrea Gasman M.D.   On: 06/21/2024 16:13    aspirin  EC  81 mg Oral Daily   buPROPion   150 mg Oral q morning   Chlorhexidine  Gluconate Cloth  6 each Topical Daily   gabapentin   100 mg Oral QHS   insulin  aspart  0-20 Units Subcutaneous TID WC   insulin  aspart  0-5 Units Subcutaneous QHS   insulin  glargine-yfgn  12 Units Subcutaneous Daily   levothyroxine   75 mcg  Oral Q0600   pantoprazole   40 mg Oral Daily   rosuvastatin   10 mg Oral Q breakfast   sertraline   100 mg Oral Daily   tamsulosin   0.4 mg Oral QPC supper    BMET    Component Value Date/Time   NA 131 (L) 06/22/2024 0457   NA 140 08/03/2019 0848   NA 137 06/18/2013 0846   K 3.2 (L) 06/22/2024 0457   K 4.1 06/18/2013 0846   CL 100 06/22/2024 0457   CL 104 06/18/2013 0846   CO2 17 (L) 06/22/2024 0457   CO2 30 06/18/2013 0846   GLUCOSE 92 06/22/2024 0457   GLUCOSE 159 (H) 06/18/2013 0846   BUN 74 (H) 06/22/2024 0457   BUN 19 08/03/2019 0848   BUN 16 06/18/2013 0846   CREATININE 5.79 (H) 06/22/2024 0457   CREATININE 0.94 06/18/2013 0846   CALCIUM  7.6 (L) 06/22/2024 0457   CALCIUM  9.0 06/18/2013 0846   GFRNONAA 7 (L) 06/22/2024 0457   GFRNONAA >60 06/18/2013 0846   GFRAA 64 08/03/2019 0848   GFRAA >60 06/18/2013 0846   CBC    Component Value Date/Time   WBC 10.2 06/21/2024 0512   RBC 3.48 (L) 06/21/2024 0512   HGB 10.2 (L) 06/21/2024 0512   HGB 12.5 06/18/2013 0846   HCT 32.0 (L) 06/21/2024 0512   HCT 36.7 06/18/2013 0846   PLT 128 (L) 06/21/2024 0512   PLT 230 06/18/2013 0846   MCV 92.0 06/21/2024 0512   MCV 85 06/18/2013 0846   MCH 29.3 06/21/2024 0512   MCHC 31.9 06/21/2024 0512   RDW 14.4 06/21/2024 0512   RDW 15.5 (H) 06/18/2013 0846   LYMPHSABS 0.8 06/18/2024 0201   MONOABS 1.0 06/18/2024 0201   EOSABS 0.0 06/18/2024 0201   BASOSABS 0.1 06/18/2024 0201    Assessment/Plan:  AKI - multifactorial with ischemic ATN in setting of urosepsis, volume depletion, and concomitant ARB, as well as left sided hydronephrosis.  Despite stent placement, BUN/Cr have continued to climb, however her blood pressure has also been low and required stopping several agents.  Agree with renal US  to evaluate for proper stent placement and resolution of hydronephrosis.  Continue to hold olmesartan  and furosemide .  It may take days to weeks before her renal function returns to baseline.   Platelet count is dropping but doubt she has HUS from E. Coli but will start workup.  Complement levels WNL, however LDH and haptoglobin mildly elevated.  Continue with supportive care for now.  No urgent indication for RRT at this time.  She does have some anorexia but otherwise is without uremic symptoms.  Renal dose meds for eGFR <15.  Her UOP has picked up over the last 24 hours and  the rate of rise in Scr has slowed.  Hopefully will start to see some improvement of Scr over the next 24-48 hours.  Renal US  was without hydronephrosis. Avoid nephrotoxic medications including NSAIDs and iodinated intravenous contrast exposure unless the latter is absolutely indicated.   Preferred narcotic agents for pain control are hydromorphone , fentanyl , and methadone. Morphine  should not be used.  Avoid Baclofen and avoid oral sodium phosphate  and magnesium citrate based laxatives / bowel preps.  Continue strict Input and Output monitoring. Will monitor the patient closely with you and intervene or adjust therapy as indicated by changes in clinical status/labs  E. Coli urosepsis - off of pressors and switched to ancef  given sensitivities. Urolithiasis with hydronephrosis of left kidney - s/p cystoscopy and left ureteral stent placement on 06/18/24.  Renal US  without hydro. Hypokalemia - will replace and follow.  DM type 2 - per primary svc Hyponatremia - likely due to AKI and impaired free water  handling.  Slowly improving.  Continue to follow. OSA - per primary HTN - bp's have been low and all meds have been held for now.  Fairy RONAL Sellar, MD [facility]

## 2024-06-22 NOTE — Plan of Care (Signed)
  Problem: Pain Managment: Goal: General experience of comfort will improve and/or be controlled Outcome: Progressing   Problem: Safety: Goal: Ability to remain free from injury will improve Outcome: Progressing   Problem: Education: Goal: Ability to describe self-care measures that may prevent or decrease complications (Diabetes Survival Skills Education) will improve Outcome: Progressing   Problem: Coping: Goal: Ability to adjust to condition or change in health will improve Outcome: Progressing   Problem: Health Behavior/Discharge Planning: Goal: Ability to identify and utilize available resources and services will improve Outcome: Progressing   Problem: Nutritional: Goal: Maintenance of adequate nutrition will improve Outcome: Progressing   Problem: Tissue Perfusion: Goal: Adequacy of tissue perfusion will improve Outcome: Progressing

## 2024-06-22 NOTE — Progress Notes (Signed)
 Patient ID: Sue Davidson, female   DOB: [birth year], 73 y.o.   MRN: [id number]  4 Days Post-Op Subjective: No new complaints.  Objective: Vital signs in last 24 hours: Temp:  [97.3 F (36.3 C)-98.3 F (36.8 C)] 98.3 F (36.8 C) (08/15 0443) Pulse Rate:  [77-81] 77 (08/15 0443) Resp:  [14-18] 18 (08/15 0443) BP: (128-142)/(51-59) 141/53 (08/15 0443) SpO2:  [96 %-97 %] 96 % (08/15 0443) Weight:  [114.9 kg] 114.9 kg (08/15 0532)  Intake/Output from previous day: 08/14 0701 - 08/15 0700 In: 1789.4 [I.V.:1392.2; IV Piggyback:397.2] Out: 1000 [Urine:1000] Intake/Output this shift: No intake/output data recorded.  Physical Exam:  General: Alert and oriented GU: No CVAT  Lab Results: Recent Labs    06/20/24 0305 06/21/24 0512  HGB 11.2* 10.2*  HCT 35.1* 32.0*      Latest Ref Rng & Units 06/21/2024    5:12 AM 06/20/2024    3:05 AM 06/19/2024    3:04 AM  CBC  WBC 4.0 - 10.5 K/uL 10.2  13.9  13.2   Hemoglobin 12.0 - 15.0 g/dL 89.7  88.7  88.6   Hematocrit 36.0 - 46.0 % 32.0  35.1  35.6   Platelets 150 - 400 K/uL 128  117  123      BMET Recent Labs    06/21/24 0512 06/22/24 0457  NA 132* 131*  K 3.1* 3.2*  CL 99 100  CO2 20* 17*  GLUCOSE 120* 92  BUN 76* 74*  CREATININE 5.24* 5.79*  CALCIUM  7.7* 7.6*     Studies/Results: US  RENAL Result Date: 06/21/2024 CLINICAL DATA:  [id number] Acute kidney injury (HCC) [id number] EXAM: RENAL / URINARY TRACT ULTRASOUND COMPLETE COMPARISON:  June 18, 2024 FINDINGS: Right Kidney: Renal measurements: 12.3 x 6.3 x 6.1 cm = volume: 246 mL.Normal echogenicity. No mass. No hydronephrosis or visualized nephrolithiasis. Left Kidney: Renal measurements: 13.6 x 6.9 x 6 cm = volume: 293 mL. Normal echogenicity. No mass. No hydronephrosis or visualized nephrolithiasis. The left ureteral stent was not well visualized by ultrasound. Bladder: Completely decompressed with a urinary catheter in place. Other: None. IMPRESSION: No hydronephrosis or  visualized nephrolithiasis in either kidney. Electronically Signed   By: Rogelia Myers M.D.   On: 06/21/2024 19:58   DG Pelvis Portable Result Date: 06/21/2024 CLINICAL DATA:  Pain. EXAM: PORTABLE PELVIS 1-2 VIEWS COMPARISON:  CT 06/18/2024 FINDINGS: Portions of the left ureteral stent are in place, the distal pigtail in the region of the pelvis. No definite stone or stone fragments along the course of the stent. Bony pelvis is intact. No fracture. No evidence of erosion or focal bone abnormality. Mild degenerative change of the pubic symphysis and sacroiliac joints. Vascular calcifications are seen. IMPRESSION: Portions of the left ureteral stent are in place. No acute osseous abnormality. Electronically Signed   By: Andrea Gasman M.D.   On: 06/21/2024 16:13    Assessment/Plan: 1) Left ureteral stone/sepsis: S/P left ureteral stent 8/11.  On cefazolin  for pansensitive E coli culture.  Continue appropriate antibiotic therapy for 10-14 days.  Will arrange outpatient followup and treatment once acute medical issues and infection resolved. 2) AKI: Renal function still worsening.  Renal ultrasound without hydronephrosis suggesting no ongoing obstructive component.   LOS: 4 days   Noretta Ferrara 06/22/2024, 8:05 AM

## 2024-06-22 NOTE — Progress Notes (Addendum)
 PROGRESS NOTE  Ilean Spradlin FMW:[id number] DOB: [birth year] DOA: 06/18/2024 PCP: Clarice Nottingham, MD   LOS: 4 days   Brief narrative: Ahlani Wickes is a 73 y.o. female with medical history significant of anemia,  anxiety, depression, bilateral carotid arterial disease, type 2 diabetes, class II obesity, GERD, grade 1 diastolic dysfunction,  pulmonary artery hypertension, hyperlipidemia,  sleep apnea,  history of urolithiasis presented to the ED with left flank pain.  In the ED patient was febrile tachycardic tachypneic, hypotensive and required supplemental oxygen.  Urinalysis showed abnormal urinalysis with more than 50 white cells.  COVID influenza and RSV was negative.  CBC showed elevated WBC at 14.8 with 85% neutrophils.  Lactic acid was initially elevated at 5.9 followed by 4.6.  CMP showed hyponatremia with sodium of 130 and creatinine elevation at 3.6 from baseline 0.8.  AST and ALT mildly elevated.  CT renal study showed left hydroureteronephrosis with 7 mm calculus in the proximal left ureter.  She had multiple falls at home after having generalized weakness.    Sepsis protocol ordered and urology was consulted.  Patient was given IV Rocephin  and vasopressors initially.  Patient was then seen by urology and underwent cystoscopy with left ureteral stent placement.      Assessment/Plan:  Sepsis secondary to  Acute UTI (urinary tract infection) E. coli bacteremia. Urolithiasis with  Hydronephrosis of left kidney Seen by urology and patient underwent cystoscopy with left ureteral stent placement on 06/18/2024.   Blood cultures growing E. coli.  Sensitivities reviewed.  Change over from ceftriaxone  to cefazolin .   Hopefully transition to oral antibiotics once her renal function starts improving. Remains afebrile.  WBC was normal yesterday.  AKI (acute kidney injury)  Creatinine was 0.84 in 2024 and she came in with a creatinine of 3.61. Likely multifactorial in a combination of  hypotension, use of medications, sepsis, poor oral intake and obstructive pathology since she was not eating and drinking for several days prior to presentation.    Holding ARB and ACE inhibitors, diuretics.   Creatinine continues to worsen however it looks like her urine output has picked up in the last 24 hours. Nephrology was consulted yesterday.  They continue to follow. Hopefully creatinine will plateau soon and then start improving.  Bicarbonate noted to be 17 this morning.  Potassium is 3.2.  Sodium is 131.  Diabetes mellitus type 2, uncontrolled with hyperglycemia HbA1c 8.0. Patient is on 70/30 insulin  at home.  Continue glargine and SSI. Monitor CBGs  Normocytic anemia/mild thrombocytopenia Stable for the most part.  No evidence of overt bleeding.  Check labs intermittently.  Hypokalemia/Hyponatremia Sodium level stable.  Potassium is stable as well.  Will give additional supplement today.     Depression  Continue bupropion  and sertraline .   Hypothyroidism  Continue Synthroid .  Right hip pain Pelvic x-rays did not show any acute findings.  Recently done CT scan of the abdomen pelvis was also reviewed.  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   OSA (obstructive sleep apnea) Still working to obtain Emogene. Declined to use a hospital CPAP device.  Currently on supplemental oxygen by nasal cannula.   Essential hypertension Holding her antihypertensives.   Dyslipidemia Continue Crestor    Gastroesophageal reflux disease Continue PPI   Class 2 obesity Current BMI 38.39 kg/m.  Would benefit from lifestyle modification.  DVT prophylaxis: SCDs Start: 06/18/24 9375 Disposition: SNF for  short-term rehab recommended by PT but patient is hesitant. Code Status:    Code Status: Full Code Family Communication: None at bedside  Consultants: Urology  Procedures: Cystoscopy  with left ureteric stent placement on 06/18/2024   Subjective: Patient feels well.  Complains of pain in the right hip area.  Denies any abdominal pain.  Does not want to go to rehab.  Okay with home health.  Objective: Vitals:   06/21/24 1909 06/22/24 0443  BP: (!) 142/51 (!) 141/53  Pulse: 81 77  Resp: 16 18  Temp: 97.7 F (36.5 C) 98.3 F (36.8 C)  SpO2: 96% 96%    Intake/Output Summary (Last 24 hours) at 06/22/2024 0951 Last data filed at 06/22/2024 0533 Gross per 24 hour  Intake 1789.38 ml  Output 1000 ml  Net 789.38 ml   Filed Weights   06/20/24 2152 06/21/24 0500 06/22/24 0532  Weight: 114.7 kg 114.7 kg 114.9 kg   Body mass index is 40.89 kg/m.   Physical Exam:  General appearance: Awake alert.  In no distress Resp: Clear to auscultation bilaterally.  Normal effort Cardio: S1-S2 is normal regular.  No S3-S4.  No rubs murmurs or bruit GI: Abdomen is soft.  Nontender nondistended.  Bowel sounds are present normal.  No masses organomegaly Extremities: Limited range of motion of the right hip noted.  Data Review: I have personally reviewed the following laboratory data and studies,  CBC: Recent Labs  Lab 06/18/24 0201 06/19/24 0304 06/20/24 0305 06/21/24 0512  WBC 14.8* 13.2* 13.9* 10.2  NEUTROABS 12.7*  --   --   --   HGB 12.0 11.3* 11.2* 10.2*  HCT 36.2 35.6* 35.1* 32.0*  MCV 91.4 94.4 93.6 92.0  PLT 159 123* 117* 128*   Basic Metabolic Panel: Recent Labs  Lab 06/18/24 0201 06/19/24 0304 06/20/24 0305 06/21/24 0512 06/22/24 0457  NA 130* 132* 130* 132* 131*  K 3.5 3.4* 4.1 3.1* 3.2*  CL 88* 96* 96* 99 100  CO2 21* 23 20* 20* 17*  GLUCOSE 302* 208* 225* 120* 92  BUN 44* 61* 70* 76* 74*  CREATININE 3.61* 4.03* 4.62* 5.24* 5.79*  CALCIUM  8.7* 8.0* 7.7* 7.7* 7.6*  MG  --  2.0 2.1  --   --   PHOS  --  4.6 4.8*  --   --    Liver Function Tests: Recent Labs  Lab 06/18/24 0201 06/19/24 0304  AST 209* 261*  ALT 49* 73*  ALKPHOS 81 64   BILITOT 0.8 0.4  PROT 6.8 6.0*  ALBUMIN 3.2* 2.2*   CBG: Recent Labs  Lab 06/21/24 0745 06/21/24 1151 06/21/24 1634 06/21/24 2042 06/22/24 0728  GLUCAP 127* 108* 124* 126* 92   Recent Results (from the past 240 hours)  Blood Culture (routine x 2)     Status: Abnormal   Collection Time: 06/18/24  2:00 AM   Specimen: BLOOD  Result Value Ref Range Status   Specimen Description   Final    BLOOD RIGHT ANTECUBITAL Performed at [facility], [address] [place], [place] [address]    Special Requests   Final    BOTTLES DRAWN AEROBIC AND ANAEROBIC Blood Culture adequate volume Performed at [facility], [address] [place], [place] [address]    Culture  Setup Time   Final    GRAM NEGATIVE RODS IN BOTH AEROBIC AND ANAEROBIC BOTTLES CRITICAL VALUE NOTED.  VALUE IS CONSISTENT WITH PREVIOUSLY REPORTED AND CALLED VALUE.  Culture (A)  Final    ESCHERICHIA COLI SUSCEPTIBILITIES PERFORMED ON PREVIOUS CULTURE WITHIN THE LAST 5 DAYS. Performed at [facility], [address], [place], [place] [address]    Report Status 06/21/2024 FINAL  Final  Blood Culture (routine x 2)     Status: Abnormal   Collection Time: 06/18/24  2:10 AM   Specimen: BLOOD LEFT WRIST  Result Value Ref Range Status   Specimen Description   Final    BLOOD LEFT WRIST Performed at [facility], [address] [place], [place] [address]    Special Requests   Final    BOTTLES DRAWN AEROBIC AND ANAEROBIC Blood Culture adequate volume Performed at [facility], [address] [place], [place] [address]    Culture  Setup Time   Final    GRAM NEGATIVE RODS IN BOTH AEROBIC AND ANAEROBIC BOTTLES CRITICAL RESULT CALLED TO, READ BACK BY AND VERIFIED WITH: PHARMD L POINTDEXTER 06/19/2024 @ 0022 BY AB Performed at [facility], [address] [place], [place] [address]    Culture ESCHERICHIA COLI (A)  Final   Report Status 06/20/2024 FINAL  Final    Organism ID, Bacteria ESCHERICHIA COLI  Final      Susceptibility   Escherichia coli - MIC*    AMPICILLIN 4 SENSITIVE Sensitive     CEFAZOLIN  Value in next row Sensitive      <=1 SENSITIVEThis is a modified FDA-approved test that has been validated and its performance characteristics determined by the reporting laboratory.  This laboratory is certified under the Clinical Laboratory Improvement Amendments CLIA as qualified to perform high complexity clinical laboratory testing.    CEFEPIME Value in next row Sensitive      <=1 SENSITIVEThis is a modified FDA-approved test that has been validated and its performance characteristics determined by the reporting laboratory.  This laboratory is certified under the Clinical Laboratory Improvement Amendments CLIA as qualified to perform high complexity clinical laboratory testing.    ERTAPENEM Value in next row Sensitive      <=1 SENSITIVEThis is a modified FDA-approved test that has been validated and its performance characteristics determined by the reporting laboratory.  This laboratory is certified under the Clinical Laboratory Improvement Amendments CLIA as qualified to perform high complexity clinical laboratory testing.    CEFTRIAXONE  Value in next row Sensitive      <=1 SENSITIVEThis is a modified FDA-approved test that has been validated and its performance characteristics determined by the reporting laboratory.  This laboratory is certified under the Clinical Laboratory Improvement Amendments CLIA as qualified to perform high complexity clinical laboratory testing.    CIPROFLOXACIN Value in next row Sensitive      <=1 SENSITIVEThis is a modified FDA-approved test that has been validated and its performance characteristics determined by the reporting laboratory.  This laboratory is certified under the Clinical Laboratory Improvement Amendments CLIA as qualified to perform high complexity clinical laboratory testing.    GENTAMICIN Value in next row  Sensitive      <=1 SENSITIVEThis is a modified FDA-approved test that has been validated and its performance characteristics determined by the reporting laboratory.  This laboratory is certified under the Clinical Laboratory Improvement Amendments CLIA as qualified to perform high complexity clinical laboratory testing.    MEROPENEM Value in next row Sensitive      <=1 SENSITIVEThis is a modified FDA-approved test that has been validated and its performance characteristics determined by the reporting laboratory.  This laboratory is certified under the Clinical Laboratory Improvement Amendments CLIA as qualified to perform high complexity clinical laboratory testing.    TRIMETH/SULFA Value in next row Sensitive      <=1 SENSITIVEThis is a modified FDA-approved test that has been validated and its performance characteristics determined by the reporting laboratory.  This laboratory is certified under the Clinical Laboratory Improvement Amendments CLIA as qualified to perform high complexity clinical laboratory testing.    AMPICILLIN/SULBACTAM Value in next row Sensitive      <=1 SENSITIVEThis is a modified FDA-approved test that has been validated and its performance characteristics determined by the reporting laboratory.  This laboratory is certified under the Clinical Laboratory Improvement Amendments CLIA as qualified to perform high complexity clinical laboratory testing.    PIP/TAZO Value in next row Sensitive ug/mL     <=4 SENSITIVEThis is a modified FDA-approved test that has been validated and its performance characteristics determined by the reporting laboratory.  This laboratory is certified under the Clinical Laboratory Improvement Amendments CLIA as qualified to perform high complexity clinical laboratory testing.    * ESCHERICHIA COLI  Blood Culture ID Panel (Reflexed)     Status: Abnormal   Collection Time: 06/18/24  2:10 AM  Result Value Ref Range Status   Enterococcus faecalis NOT DETECTED  NOT DETECTED Final   Enterococcus Faecium NOT DETECTED NOT DETECTED Final   Listeria monocytogenes NOT DETECTED NOT DETECTED Final   Staphylococcus species NOT DETECTED NOT DETECTED Final   Staphylococcus aureus (BCID) NOT DETECTED NOT DETECTED Final   Staphylococcus epidermidis NOT DETECTED NOT DETECTED Final   Staphylococcus lugdunensis NOT DETECTED NOT DETECTED Final   Streptococcus species NOT DETECTED NOT DETECTED Final   Streptococcus agalactiae NOT DETECTED NOT DETECTED Final   Streptococcus pneumoniae NOT DETECTED NOT DETECTED Final   Streptococcus pyogenes NOT DETECTED NOT DETECTED Final   A.calcoaceticus-baumannii NOT DETECTED NOT DETECTED Final   Bacteroides fragilis NOT DETECTED NOT DETECTED Final   Enterobacterales DETECTED (A) NOT DETECTED Final    Comment: Enterobacterales represent a large order of gram negative bacteria, not a single organism. CRITICAL RESULT CALLED TO, READ BACK BY AND VERIFIED WITH: PHARMD L POINTDEXTER 06/19/2024 @ 0022 BY AB    Enterobacter cloacae complex NOT DETECTED NOT DETECTED Final   Escherichia coli DETECTED (A) NOT DETECTED Final    Comment: CRITICAL RESULT CALLED TO, READ BACK BY AND VERIFIED WITH: PHARMD L POINTDEXTER 06/19/2024 @ 0022 BY AB    Klebsiella aerogenes NOT DETECTED NOT DETECTED Final   Klebsiella oxytoca NOT DETECTED NOT DETECTED Final   Klebsiella pneumoniae NOT DETECTED NOT DETECTED Final   Proteus species NOT DETECTED NOT DETECTED Final   Salmonella species NOT DETECTED NOT DETECTED Final   Serratia marcescens NOT DETECTED NOT DETECTED Final   Haemophilus influenzae NOT DETECTED NOT DETECTED Final   Neisseria meningitidis NOT DETECTED NOT DETECTED Final   Pseudomonas aeruginosa NOT DETECTED NOT DETECTED Final   Stenotrophomonas maltophilia NOT DETECTED NOT DETECTED Final   Candida albicans NOT DETECTED NOT DETECTED Final   Candida auris NOT DETECTED NOT DETECTED Final   Candida glabrata NOT DETECTED NOT DETECTED Final    Candida krusei NOT DETECTED NOT DETECTED Final   Candida parapsilosis NOT DETECTED NOT DETECTED Final   Candida tropicalis NOT DETECTED NOT DETECTED Final   Cryptococcus neoformans/gattii NOT DETECTED NOT DETECTED Final   CTX-M ESBL NOT DETECTED NOT DETECTED Final   Carbapenem resistance IMP NOT DETECTED NOT DETECTED Final   Carbapenem resistance KPC NOT  DETECTED NOT DETECTED Final   Carbapenem resistance NDM NOT DETECTED NOT DETECTED Final   Carbapenem resist OXA 48 LIKE NOT DETECTED NOT DETECTED Final   Carbapenem resistance VIM NOT DETECTED NOT DETECTED Final    Comment: Performed at [facility], [address], [place], [place] [address]  Resp panel by RT-PCR (RSV, Flu A&B, Covid) Anterior Nasal Swab     Status: None   Collection Time: 06/18/24  3:11 AM   Specimen: Anterior Nasal Swab  Result Value Ref Range Status   SARS Coronavirus 2 by RT PCR NEGATIVE NEGATIVE Final    Comment: (NOTE) SARS-CoV-2 target nucleic acids are NOT DETECTED.  The SARS-CoV-2 RNA is generally detectable in upper respiratory specimens during the acute phase of infection. The lowest concentration of SARS-CoV-2 viral copies this assay can detect is 138 copies/mL. A negative result does not preclude SARS-Cov-2 infection and should not be used as the sole basis for treatment or other patient management decisions. A negative result may occur with  improper specimen collection/handling, submission of specimen other than nasopharyngeal swab, presence of viral mutation(s) within the areas targeted by this assay, and inadequate number of viral copies(<138 copies/mL). A negative result must be combined with clinical observations, patient history, and epidemiological information. The expected result is Negative.  Fact Sheet for Patients:  BloggerCourse.com  Fact Sheet for Healthcare Providers:  SeriousBroker.it  This test is no t yet approved or cleared  by the [place]  FDA and  has been authorized for detection and/or diagnosis of SARS-CoV-2 by FDA under an Emergency Use Authorization (EUA). This EUA will remain  in effect (meaning this test can be used) for the duration of the COVID-19 declaration under Section 564(b)(1) of the Act, 21 U.S.C.section 360bbb-3(b)(1), unless the authorization is terminated  or revoked sooner.       Influenza A by PCR NEGATIVE NEGATIVE Final   Influenza B by PCR NEGATIVE NEGATIVE Final    Comment: (NOTE) The Xpert Xpress SARS-CoV-2/FLU/RSV plus assay is intended as an aid in the diagnosis of influenza from Nasopharyngeal swab specimens and should not be used as a sole basis for treatment. Nasal washings and aspirates are unacceptable for Xpert Xpress SARS-CoV-2/FLU/RSV testing.  Fact Sheet for Patients: BloggerCourse.com  Fact Sheet for Healthcare Providers: SeriousBroker.it  This test is not yet approved or cleared by the [place]  FDA and has been authorized for detection and/or diagnosis of SARS-CoV-2 by FDA under an Emergency Use Authorization (EUA). This EUA will remain in effect (meaning this test can be used) for the duration of the COVID-19 declaration under Section 564(b)(1) of the Act, 21 U.S.C. section 360bbb-3(b)(1), unless the authorization is terminated or revoked.     Resp Syncytial Virus by PCR NEGATIVE NEGATIVE Final    Comment: (NOTE) Fact Sheet for Patients: BloggerCourse.com  Fact Sheet for Healthcare Providers: SeriousBroker.it  This test is not yet approved or cleared by the [place]  FDA and has been authorized for detection and/or diagnosis of SARS-CoV-2 by FDA under an Emergency Use Authorization (EUA). This EUA will remain in effect (meaning this test can be used) for the duration of the COVID-19 declaration under Section 564(b)(1) of the Act, 21  U.S.C. section 360bbb-3(b)(1), unless the authorization is terminated or revoked.  Performed at [facility], [address], [place], [place] [address]   Urine Culture     Status: Abnormal   Collection Time: 06/18/24  7:24 AM   Specimen: Urine, Cystoscope  Result Value  Ref Range Status   Specimen Description   Final    CYSTOSCOPY URINE CYSTOSCOPE, PT ON CEFTRIAXONE  Performed at [facility], [address], [place], [place] [address]    Special Requests   Final    NONE Performed at [facility], [address] [place], [place] [address]    Culture 20,000 COLONIES/mL ESCHERICHIA COLI (A)  Final   Report Status 06/20/2024 FINAL  Final   Organism ID, Bacteria ESCHERICHIA COLI (A)  Final      Susceptibility   Escherichia coli - MIC*    AMPICILLIN 4 SENSITIVE Sensitive     CEFAZOLIN  Value in next row Sensitive      <=1 SENSITIVEThis is a modified FDA-approved test that has been validated and its performance characteristics determined by the reporting laboratory.  This laboratory is certified under the Clinical Laboratory Improvement Amendments CLIA as qualified to perform high complexity clinical laboratory testing.    CEFEPIME Value in next row Sensitive      <=1 SENSITIVEThis is a modified FDA-approved test that has been validated and its performance characteristics determined by the reporting laboratory.  This laboratory is certified under the Clinical Laboratory Improvement Amendments CLIA as qualified to perform high complexity clinical laboratory testing.    ERTAPENEM Value in next row Sensitive      <=1 SENSITIVEThis is a modified FDA-approved test that has been validated and its performance characteristics determined by the reporting laboratory.  This laboratory is certified under the Clinical Laboratory Improvement Amendments CLIA as qualified to perform high complexity clinical laboratory testing.    CEFTRIAXONE  Value in next row  Sensitive      <=1 SENSITIVEThis is a modified FDA-approved test that has been validated and its performance characteristics determined by the reporting laboratory.  This laboratory is certified under the Clinical Laboratory Improvement Amendments CLIA as qualified to perform high complexity clinical laboratory testing.    CIPROFLOXACIN Value in next row Sensitive      <=1 SENSITIVEThis is a modified FDA-approved test that has been validated and its performance characteristics determined by the reporting laboratory.  This laboratory is certified under the Clinical Laboratory Improvement Amendments CLIA as qualified to perform high complexity clinical laboratory testing.    GENTAMICIN Value in next row Sensitive      <=1 SENSITIVEThis is a modified FDA-approved test that has been validated and its performance characteristics determined by the reporting laboratory.  This laboratory is certified under the Clinical Laboratory Improvement Amendments CLIA as qualified to perform high complexity clinical laboratory testing.    MEROPENEM Value in next row Sensitive      <=1 SENSITIVEThis is a modified FDA-approved test that has been validated and its performance characteristics determined by the reporting laboratory.  This laboratory is certified under the Clinical Laboratory Improvement Amendments CLIA as qualified to perform high complexity clinical laboratory testing.    TRIMETH/SULFA Value in next row Sensitive      <=1 SENSITIVEThis is a modified FDA-approved test that has been validated and its performance characteristics determined by the reporting laboratory.  This laboratory is certified under the Clinical Laboratory Improvement Amendments CLIA as qualified to perform high complexity clinical laboratory testing.    AMPICILLIN/SULBACTAM Value in next row Sensitive      <=1 SENSITIVEThis is a modified FDA-approved test that has been validated and its performance characteristics determined by the reporting  laboratory.  This laboratory is certified under the Clinical Laboratory Improvement Amendments CLIA as qualified to perform high complexity  clinical laboratory testing.    PIP/TAZO Value in next row Sensitive ug/mL     <=4 SENSITIVEThis is a modified FDA-approved test that has been validated and its performance characteristics determined by the reporting laboratory.  This laboratory is certified under the Clinical Laboratory Improvement Amendments CLIA as qualified to perform high complexity clinical laboratory testing.    * 20,000 COLONIES/mL ESCHERICHIA COLI  MRSA Next Gen by PCR, Nasal     Status: None   Collection Time: 06/18/24 10:53 AM   Specimen: Nasal Mucosa; Nasal Swab  Result Value Ref Range Status   MRSA by PCR Next Gen NOT DETECTED NOT DETECTED Final    Comment: (NOTE) The GeneXpert MRSA Assay (FDA approved for NASAL specimens only), is one component of a comprehensive MRSA colonization surveillance program. It is not intended to diagnose MRSA infection nor to guide or monitor treatment for MRSA infections. Test performance is not FDA approved in patients less than 47 years old. Performed at [facility], [address] [place], [place] [address]      Studies: US  RENAL Result Date: 06/21/2024 CLINICAL DATA:  [id number] Acute kidney injury [facility]) [id number] EXAM: RENAL / URINARY TRACT ULTRASOUND COMPLETE COMPARISON:  June 18, 2024 FINDINGS: Right Kidney: Renal measurements: 12.3 x 6.3 x 6.1 cm = volume: 246 mL.Normal echogenicity. No mass. No hydronephrosis or visualized nephrolithiasis. Left Kidney: Renal measurements: 13.6 x 6.9 x 6 cm = volume: 293 mL. Normal echogenicity. No mass. No hydronephrosis or visualized nephrolithiasis. The left ureteral stent was not well visualized by ultrasound. Bladder: Completely decompressed with a urinary catheter in place. Other: None. IMPRESSION: No hydronephrosis or visualized nephrolithiasis in either kidney. Electronically Signed    By: Rogelia Myers M.D.   On: 06/21/2024 19:58   DG Pelvis Portable Result Date: 06/21/2024 CLINICAL DATA:  Pain. EXAM: PORTABLE PELVIS 1-2 VIEWS COMPARISON:  CT 06/18/2024 FINDINGS: Portions of the left ureteral stent are in place, the distal pigtail in the region of the pelvis. No definite stone or stone fragments along the course of the stent. Bony pelvis is intact. No fracture. No evidence of erosion or focal bone abnormality. Mild degenerative change of the pubic symphysis and sacroiliac joints. Vascular calcifications are seen. IMPRESSION: Portions of the left ureteral stent are in place. No acute osseous abnormality. Electronically Signed   By: Andrea Gasman M.D.   On: 06/21/2024 16:13     Joette Pebbles, MD  [facility] 06/22/2024  If 7PM-7AM, please contact night-coverage

## 2024-06-22 NOTE — Plan of Care (Signed)
  Problem: Education: Goal: Knowledge of General Education information will improve Description: Including pain rating scale, medication(s)/side effects and non-pharmacologic comfort measures Outcome: Progressing   Problem: Clinical Measurements: Goal: Ability to maintain clinical measurements within normal limits will improve Outcome: Progressing Goal: Will remain free from infection Outcome: Progressing Goal: Diagnostic test results will improve Outcome: Progressing   Problem: Activity: Goal: Risk for activity intolerance will decrease Outcome: Progressing   Problem: Elimination: Goal: Will not experience complications related to urinary retention Outcome: Progressing   Problem: Pain Managment: Goal: General experience of comfort will improve and/or be controlled Outcome: Progressing   Problem: Education: Goal: Ability to describe self-care measures that may prevent or decrease complications (Diabetes Survival Skills Education) will improve Outcome: Progressing   Problem: Fluid Volume: Goal: Ability to maintain a balanced intake and output will improve Outcome: Progressing   Problem: Metabolic: Goal: Ability to maintain appropriate glucose levels will improve Outcome: Progressing   Problem: Nutritional: Goal: Progress toward achieving an optimal weight will improve Outcome: Progressing

## 2024-06-23 DIAGNOSIS — E871 Hypo-osmolality and hyponatremia: Secondary | ICD-10-CM | POA: Diagnosis not present

## 2024-06-23 DIAGNOSIS — I10 Essential (primary) hypertension: Secondary | ICD-10-CM

## 2024-06-23 DIAGNOSIS — K219 Gastro-esophageal reflux disease without esophagitis: Secondary | ICD-10-CM

## 2024-06-23 DIAGNOSIS — N139 Obstructive and reflux uropathy, unspecified: Secondary | ICD-10-CM

## 2024-06-23 DIAGNOSIS — E785 Hyperlipidemia, unspecified: Secondary | ICD-10-CM

## 2024-06-23 DIAGNOSIS — E039 Hypothyroidism, unspecified: Secondary | ICD-10-CM

## 2024-06-23 DIAGNOSIS — G4733 Obstructive sleep apnea (adult) (pediatric): Secondary | ICD-10-CM

## 2024-06-23 DIAGNOSIS — R6520 Severe sepsis without septic shock: Secondary | ICD-10-CM

## 2024-06-23 DIAGNOSIS — E66813 Obesity, class 3: Secondary | ICD-10-CM

## 2024-06-23 DIAGNOSIS — D696 Thrombocytopenia, unspecified: Secondary | ICD-10-CM

## 2024-06-23 DIAGNOSIS — N179 Acute kidney failure, unspecified: Secondary | ICD-10-CM | POA: Diagnosis not present

## 2024-06-23 LAB — BASIC METABOLIC PANEL WITH GFR
Anion gap: 12 (ref 5–15)
Anion gap: 15 (ref 5–15)
BUN: 72 mg/dL — ABNORMAL HIGH (ref 8–23)
BUN: 75 mg/dL — ABNORMAL HIGH (ref 8–23)
CO2: 18 mmol/L — ABNORMAL LOW (ref 22–32)
CO2: 17 mmol/L — ABNORMAL LOW (ref 22–32)
Calcium: 7.8 mg/dL — ABNORMAL LOW (ref 8.9–10.3)
Calcium: 8.2 mg/dL — ABNORMAL LOW (ref 8.9–10.3)
Chloride: 102 mmol/L (ref 98–111)
Chloride: 102 mmol/L (ref 98–111)
Creatinine, Ser: 6.38 mg/dL — ABNORMAL HIGH (ref 0.44–1.00)
Creatinine, Ser: 6.4 mg/dL — ABNORMAL HIGH (ref 0.44–1.00)
GFR, Estimated: 6 mL/min — ABNORMAL LOW (ref >60)
GFR, Estimated: 6 mL/min — ABNORMAL LOW (ref >60)
Glucose, Bld: 105 mg/dL — ABNORMAL HIGH (ref 70–99)
Glucose, Bld: 133 mg/dL — ABNORMAL HIGH (ref 70–99)
Potassium: 3.7 mmol/L (ref 3.5–5.1)
Potassium: 3.5 mmol/L (ref 3.5–5.1)
Sodium: 132 mmol/L — ABNORMAL LOW (ref 135–145)
Sodium: 134 mmol/L — ABNORMAL LOW (ref 135–145)

## 2024-06-23 LAB — TECHNOLOGIST SMEAR REVIEW

## 2024-06-23 LAB — CBC WITH DIFFERENTIAL/PLATELET
Abs Immature Granulocytes: 1.21 K/uL — ABNORMAL HIGH (ref 0.00–0.07)
Band Neutrophils: 0 %
Basophils Absolute: 0.1 K/uL (ref 0.0–0.1)
Basophils Relative: 1 %
Blasts: 0 %
Eosinophils Absolute: 0.2 K/uL (ref 0.0–0.5)
Eosinophils Relative: 2 %
HCT: 36.4 % (ref 36.0–46.0)
Hemoglobin: 11.5 g/dL — ABNORMAL LOW (ref 12.0–15.0)
Immature Granulocytes: 10 %
Lymphocytes Relative: 11 %
Lymphs Abs: 1.3 K/uL (ref 0.7–4.0)
MCH: 29.3 pg (ref 26.0–34.0)
MCHC: 31.6 g/dL (ref 30.0–36.0)
MCV: 92.9 fL (ref 80.0–100.0)
Metamyelocytes Relative: 0 %
Monocytes Absolute: 0.6 K/uL (ref 0.1–1.0)
Monocytes Relative: 5 %
Myelocytes: 0 %
Neutro Abs: 8.7 K/uL — ABNORMAL HIGH (ref 1.7–7.7)
Neutrophils Relative %: 71 %
Other: 0 %
Platelets: 193 K/uL (ref 150–400)
Promyelocytes Relative: 0 %
RBC: 3.92 MIL/uL (ref 3.87–5.11)
RDW: 15.2 % (ref 11.5–15.5)
WBC: 12.1 K/uL — ABNORMAL HIGH (ref 4.0–10.5)
nRBC: 0 % (ref 0.0–0.2)
nRBC: 0 /100{WBCs}

## 2024-06-23 LAB — GLUCOSE, CAPILLARY
Glucose-Capillary: 96 mg/dL (ref 70–99)
Glucose-Capillary: 121 mg/dL — ABNORMAL HIGH (ref 70–99)
Glucose-Capillary: 146 mg/dL — ABNORMAL HIGH (ref 70–99)

## 2024-06-23 LAB — COMPLEMENT, TOTAL: Compl, Total (CH50): >60 U/mL (ref >41)

## 2024-06-23 MED ORDER — FUROSEMIDE 10 MG/ML IJ SOLN
40.0000 mg | Freq: Once | INTRAMUSCULAR | Status: AC
  Administered 2024-06-23: 40 mg via INTRAVENOUS
  Filled 2024-06-23: qty 4

## 2024-06-23 MED ORDER — CEFAZOLIN SODIUM-DEXTROSE 1-4 GM/50ML-% IV SOLN
1.0000 g | Freq: Two times a day (BID) | INTRAVENOUS | Status: DC
  Administered 2024-06-23 (×2): 1 g via INTRAVENOUS
  Filled 2024-06-23 (×4): qty 50

## 2024-06-23 MED ORDER — FUROSEMIDE 10 MG/ML IJ SOLN
INTRAMUSCULAR | Status: AC
  Filled 2024-06-23: qty 4

## 2024-06-23 NOTE — Progress Notes (Signed)
 Progress Note   Patient: Sue Davidson FMW:[id number] DOB: May 02, 1951 DOA: 06/18/2024     5 DOS: the patient was seen and examined on 06/23/2024   Brief hospital course: 73 y.o. female with medical history significant of anemia,  anxiety, depression, bilateral carotid arterial disease, type 2 diabetes, class II obesity, GERD, grade 1 diastolic dysfunction,  pulmonary artery hypertension, hyperlipidemia,  sleep apnea,  history of urolithiasis presented to the ED with left flank pain.  In the ED patient was febrile tachycardic tachypneic, hypotensive and required supplemental oxygen.  Urinalysis showed abnormal urinalysis with more than 50 white cells.  COVID influenza and RSV was negative.  CBC showed elevated WBC at 14.8 with 85% neutrophils.  Lactic acid was initially elevated at 5.9 followed by 4.6.  CMP showed hyponatremia with sodium of 130 and creatinine elevation at 3.6 from baseline 0.8.  AST and ALT mildly elevated.  CT renal study showed left hydroureteronephrosis with 7 mm calculus in the proximal left ureter.  She had multiple falls at home after having generalized weakness.    Sepsis protocol ordered and urology was consulted.  Patient was given IV Rocephin  and vasopressors initially.  Patient was then seen by urology and underwent cystoscopy with left ureteral stent placement on 8/11.  8/16.  Creatinine continues to rise at 6.38.  Nephrology stopped IV fluid hydration.   Assessment and Plan: * Severe sepsis (HCC) Present on admission with fever and leukocytosis.  Patient has bacteremia with E. coli from urine source.  Patient with acute kidney injury.  Currently on cefazolin .  AKI (acute kidney injury) (HCC) Creatinine continues to rise on a daily basis.  Today's creatinine 6.38.  Nephrology following.  IV fluids discontinued.  Continue to hold home losartan.  Nephrology gave 1 dose of Lasix  today.  Acute unilateral obstructive uropathy Urology placed a stent in the left ureter.   Patient has left ureteral Calculus and sepsis.  Thrombocytopenia (HCC) Likely secondary to sepsis.  Last platelet count normal range at 193.  Essential hypertension Antihypertensives on hold  Hyponatremia Last sodium 132.  Obesity, Class III, BMI 40-49.9 (morbid obesity) BMI 40.89  Dyslipidemia On Crestor   Gastroesophageal reflux disease On PPI  Hypothyroidism Continue Synthroid   OSA (obstructive sleep apnea) Not on CPAP        Subjective: Patient feeling a little bit better.  Eating a little bit more.  Still very weak.  Physical therapy recommending rehab.  Creatinine continues to rise at 6.38.  Admitted with severe sepsis.  Physical Exam: Vitals:   06/22/24 1700 06/22/24 2040 06/23/24 0458 06/23/24 1239  BP: (!) 139/53 (!) 134/50 (!) 141/68 (!) 149/60  Pulse: 72 78 73 71  Resp: 18 15 15 18   Temp:  97.6 F (36.4 C) (!) 97.5 F (36.4 C) 97.9 F (36.6 C)  TempSrc:   Oral Oral  SpO2: 97% 99% 96% 99%  Weight:      Height:       Physical Exam HENT:     Head: Normocephalic.  Eyes:     General: Lids are normal.     Conjunctiva/sclera: Conjunctivae normal.  Cardiovascular:     Rate and Rhythm: Normal rate and regular rhythm.     Heart sounds: Normal heart sounds, S1 normal and S2 normal.  Pulmonary:     Breath sounds: No decreased breath sounds, wheezing, rhonchi or rales.  Abdominal:     Palpations: Abdomen is soft.     Tenderness: There is no abdominal tenderness.  Musculoskeletal:  Right lower leg: No swelling.     Left lower leg: No swelling.  Skin:    General: Skin is warm.     Findings: No rash.  Neurological:     Mental Status: She is alert and oriented to person, place, and time.     Data Reviewed: Sodium 132, potassium 3.7, creatinine 6.38, BUN 72, white blood count 12.1, hemoglobin 11.5, platelet count 193  Family Communication: Left message for patient's daughter  Disposition: Status is: Inpatient Remains inpatient appropriate  because: Creatinine continues to rise daily.  Will need to see if peak downtrend better into the low threes or twos.  Planned Discharge Destination: Rehab    Time spent: 28 minutes  Author: Charlie Patterson, MD 06/23/2024 1:28 PM  For on call review [email or web address].

## 2024-06-23 NOTE — Assessment & Plan Note (Signed)
 On PPI

## 2024-06-23 NOTE — Assessment & Plan Note (Signed)
 Likely secondary to sepsis.  Last platelet count normal range at 193.

## 2024-06-23 NOTE — Plan of Care (Signed)
  Problem: Education: Goal: Knowledge of General Education information will improve Description: Including pain rating scale, medication(s)/side effects and non-pharmacologic comfort measures Outcome: Progressing   Problem: Health Behavior/Discharge Planning: Goal: Ability to manage health-related needs will improve Outcome: Progressing   Problem: Clinical Measurements: Goal: Ability to maintain clinical measurements within normal limits will improve Outcome: Progressing Goal: Will remain free from infection Outcome: Progressing Goal: Diagnostic test results will improve Outcome: Progressing   Problem: Activity: Goal: Risk for activity intolerance will decrease Outcome: Not Progressing   Problem: Nutrition: Goal: Adequate nutrition will be maintained Outcome: Progressing   Problem: Pain Managment: Goal: General experience of comfort will improve and/or be controlled Outcome: Progressing

## 2024-06-23 NOTE — Assessment & Plan Note (Addendum)
 Present on admission with fever and leukocytosis.  Patient has bacteremia with E. coli from urine source.  Patient with acute kidney injury.  Currently on cefazolin .

## 2024-06-23 NOTE — Assessment & Plan Note (Signed)
 Antihypertensives on hold.

## 2024-06-23 NOTE — Assessment & Plan Note (Addendum)
 BMI 41.77

## 2024-06-23 NOTE — Assessment & Plan Note (Signed)
 On Crestor 

## 2024-06-23 NOTE — Assessment & Plan Note (Addendum)
 Urology placed a stent in the left ureter on 8/11.  Patient has left ureteral Calculus and sepsis.

## 2024-06-23 NOTE — Plan of Care (Signed)
  Problem: Clinical Measurements: Goal: Will remain free from infection Outcome: Progressing Goal: Respiratory complications will improve Outcome: Progressing Goal: Cardiovascular complication will be avoided Outcome: Progressing   Problem: Activity: Goal: Risk for activity intolerance will decrease Outcome: Progressing   Problem: Pain Managment: Goal: General experience of comfort will improve and/or be controlled Outcome: Progressing   Problem: Safety: Goal: Ability to remain free from injury will improve Outcome: Progressing   Problem: Clinical Measurements: Goal: Respiratory complications will improve Outcome: Progressing   Problem: Clinical Measurements: Goal: Cardiovascular complication will be avoided Outcome: Progressing   Problem: Activity: Goal: Risk for activity intolerance will decrease Outcome: Progressing

## 2024-06-23 NOTE — Assessment & Plan Note (Addendum)
 Creatinine continues to rise on a daily basis.  Today's creatinine 6.  6 7.  Nephrology following.  IV fluids discontinued on 8/16.  Continue to hold home losartan.  Nephrology gave 1 dose of Lasix  on 8/16.

## 2024-06-23 NOTE — Assessment & Plan Note (Addendum)
Last sodium 134

## 2024-06-23 NOTE — Assessment & Plan Note (Signed)
 Continue Synthroid 

## 2024-06-23 NOTE — Progress Notes (Signed)
 PHARMACY NOTE:  ANTIMICROBIAL RENAL DOSAGE ADJUSTMENT  Current antimicrobial regimen includes a mismatch between antimicrobial dosage and estimated renal function.  As per policy approved by the Pharmacy & Therapeutics and Medical Executive Committees, the antimicrobial dosage will be adjusted accordingly.  Current antimicrobial dosage:  Cefazolin  2g IV q12h  Indication: Ecoli urosepsis, bacteremia  Renal Function: Estimated Creatinine Clearance: 10 mL/min (A) (by C-G formula based on SCr of 6.38 mg/dL (H)). []      On intermittent HD, scheduled: []      On CRRT    Antimicrobial dosage has been changed to:  Cefazolin  1g IV q12h   Thank you for allowing pharmacy to be a part of this patient's care.  Wanda Hasting PharmD, BCPS [facility] main pharmacy [phone] 06/23/2024 8:48 AM

## 2024-06-23 NOTE — Assessment & Plan Note (Signed)
 Not on CPAP

## 2024-06-23 NOTE — Progress Notes (Signed)
 [facility] Progress Note  Subjective:  UOP yest 2400 cc Creat up to 6.38 K+ 3.7  Vitals:   06/22/24 0532 06/22/24 1700 06/22/24 2040 06/23/24 0458  BP:  (!) 139/53 (!) 134/50 (!) 141/68  Pulse:  72 78 73  Resp:  18 15 15   Temp:   97.6 F (36.4 C) (!) 97.5 F (36.4 C)  TempSrc:    Oral  SpO2:  97% 99% 96%  Weight: 114.9 kg     Height:        Exam: Gen: NAD CVS: RRR Resp:CTA Abd: +BS, soft, NT/ND Ext: diffuse 1-2+ bilat LE > UE edema  Home bp meds:  Norvasc  2.5 every day Furosemide  80mg  every day Olmesartan  40mg  every day Others: asa, zoloft , mounjaro, crestor , PPI, T4, insulin , wellbutrin  and flomax     Assessment/ Plan: AKI - multifactorial with ischemic ATN in setting of urosepsis, volume depletion, and concomitant ARB, as well as left sided hydronephrosis.  Despite stent placement, BUN/Cr have continued to climb, however her blood pressure was also low and required stopping several agents. F/u renal US  on 8/14 showed no hydronephrosis post stent placement on 8/11.  Continue to hold olmesartan  and furosemide . Platelet count drops to 110's -- HUS from E. Coli UTI unlikely but possible; will get smear review.  UOP is better but creat up again today. Starting to develop edema in the LE's- will dc IVF's and give 1 dose IV lasix  40mg . May need HD soon if renal function isn't recovering, have d/w pt / family and questions answered.  E. Coli urosepsis - was in ICU on pressors, now off, getting IV ancef  Urolithiasis with hydronephrosis of left kidney - s/p cystoscopy and left ureteral stent placement on 06/18/24.  F/u US  8/14 was neg for obstruction.  DM type 2 - per primary svc HTN - bp's were low and home meds are on hold. Current bp's 140/70 range, stable.      Sue Fret MD  [facility] 06/23/2024, 11:25 AM  Recent Labs  Lab 06/18/24 0201 06/19/24 0304 06/20/24 0305 06/21/24 0512 06/22/24 0457 06/23/24 0548  HGB 12.0 11.3* 11.2* 10.2*  --   --   ALBUMIN  3.2* 2.2*  --   --   --   --   CALCIUM  8.7* 8.0* 7.7* 7.7* 7.6* 7.8*  PHOS  --  4.6 4.8*  --   --   --   CREATININE 3.61* 4.03* 4.62* 5.24* 5.79* 6.38*  K 3.5 3.4* 4.1 3.1* 3.2* 3.7   No results for input(s): IRON, TIBC, FERRITIN in the last 168 hours. Inpatient medications:  aspirin  EC  81 mg Oral Daily   buPROPion   150 mg Oral q morning   Chlorhexidine  Gluconate Cloth  6 each Topical Daily   gabapentin   100 mg Oral QHS   insulin  aspart  0-20 Units Subcutaneous TID WC   insulin  aspart  0-5 Units Subcutaneous QHS   insulin  glargine-yfgn  12 Units Subcutaneous Daily   levothyroxine   75 mcg Oral Q0600   pantoprazole   40 mg Oral Daily   rosuvastatin   10 mg Oral Q breakfast   sertraline   100 mg Oral Daily   tamsulosin   0.4 mg Oral QPC supper     ceFAZolin  (ANCEF ) IV 1 g (06/23/24 0953)   HYDROmorphone  (DILAUDID ) injection, mouth rinse, oxyCODONE , polyethylene glycol, prochlorperazine 

## 2024-06-24 DIAGNOSIS — B9620 Unspecified Escherichia coli [E. coli] as the cause of diseases classified elsewhere: Secondary | ICD-10-CM

## 2024-06-24 DIAGNOSIS — F3289 Other specified depressive episodes: Secondary | ICD-10-CM

## 2024-06-24 DIAGNOSIS — D696 Thrombocytopenia, unspecified: Secondary | ICD-10-CM | POA: Diagnosis not present

## 2024-06-24 DIAGNOSIS — Z794 Long term (current) use of insulin: Secondary | ICD-10-CM

## 2024-06-24 DIAGNOSIS — E1165 Type 2 diabetes mellitus with hyperglycemia: Secondary | ICD-10-CM

## 2024-06-24 DIAGNOSIS — R7881 Bacteremia: Secondary | ICD-10-CM

## 2024-06-24 DIAGNOSIS — N179 Acute kidney failure, unspecified: Secondary | ICD-10-CM | POA: Diagnosis not present

## 2024-06-24 DIAGNOSIS — N139 Obstructive and reflux uropathy, unspecified: Secondary | ICD-10-CM | POA: Diagnosis not present

## 2024-06-24 LAB — BASIC METABOLIC PANEL WITH GFR
Anion gap: 14 (ref 5–15)
BUN: 77 mg/dL — ABNORMAL HIGH (ref 8–23)
CO2: 16 mmol/L — ABNORMAL LOW (ref 22–32)
Calcium: 8 mg/dL — ABNORMAL LOW (ref 8.9–10.3)
Chloride: 104 mmol/L (ref 98–111)
Creatinine, Ser: 6.67 mg/dL — ABNORMAL HIGH (ref 0.44–1.00)
GFR, Estimated: 6 mL/min — ABNORMAL LOW (ref >60)
Glucose, Bld: 89 mg/dL (ref 70–99)
Potassium: 3.5 mmol/L (ref 3.5–5.1)
Sodium: 134 mmol/L — ABNORMAL LOW (ref 135–145)

## 2024-06-24 LAB — GLUCOSE, CAPILLARY
Glucose-Capillary: 90 mg/dL (ref 70–99)
Glucose-Capillary: 119 mg/dL — ABNORMAL HIGH (ref 70–99)
Glucose-Capillary: 100 mg/dL — ABNORMAL HIGH (ref 70–99)
Glucose-Capillary: 106 mg/dL — ABNORMAL HIGH (ref 70–99)

## 2024-06-24 MED ORDER — CEFAZOLIN SODIUM-DEXTROSE 1-4 GM/50ML-% IV SOLN
1.0000 g | INTRAVENOUS | Status: DC
  Administered 2024-06-24: 1 g via INTRAVENOUS
  Filled 2024-06-24: qty 50

## 2024-06-24 MED ORDER — INSULIN GLARGINE-YFGN 100 UNIT/ML ~~LOC~~ SOLN: 6.0000 [IU] | Freq: Every day | SUBCUTANEOUS | Status: DC

## 2024-06-24 NOTE — Progress Notes (Signed)
 [facility] Progress Note  Subjective:  UOP yest 1100, today 1350 cc Getting IV lasix  80 bid Creat up to 6.6 today (6.3 yesterday) Feeling good  Vitals:   06/23/24 1239 06/23/24 1700 06/23/24 2050 06/24/24 1158  BP: (!) 149/60  (!) 151/70 (!) 155/67  Pulse: 71  72 74  Resp: 18   16  Temp: 97.9 F (36.6 C)  97.9 F (36.6 C) 97.7 F (36.5 C)  TempSrc: Oral  Oral Oral  SpO2: 99%  100% 100%  Weight:  117.4 kg    Height:        Exam: Gen: NAD CVS: RRR Resp:CTA Abd: +BS, soft, NT/ND Ext: diffuse 1-2+ bilat LE > UE edema  Home bp meds:  Norvasc  2.5 every day Furosemide  80mg  every day Olmesartan  40mg  every day Others: asa, zoloft , mounjaro, crestor , PPI, T4, insulin , wellbutrin  and flomax     Assessment/ Plan: AKI - multifactorial with ischemic ATN in setting of urosepsis, volume depletion, and concomitant ARB, as well as left sided hydronephrosis.  Despite stent placement, creat continued to climb, however her blood pressure was also low and required stopping several agents. F/u renal US  on 8/14 showed no hydronephrosis post stent placement on 8/11.  Continuing to hold olmesartan  and furosemide . Plts low 110, now back up to 193 so doubt HUS, no further w/u. UOP much better last 48hrs, and creat only slightly up today at 6.6 (from 6.3). Appears to be starting to recover. No uremic findings. Will cont supportive care, f/u labs in am.  E. Coli urosepsis - was in ICU on pressors, now off, getting IV ancef  Urolithiasis with hydronephrosis of left kidney - s/p cystoscopy and left ureteral stent placement on 06/18/24.  F/u US  8/14 was neg for obstruction.  Volume overload: remains edematous in the LE's, UOP is better, will consider more IV lasix  when creat is improving.  HTN - bp's were low and home meds are on hold. Current bp's 140/70 range, stable.      Rob Alishah Schulte MD  [facility] 06/24/2024, 2:36 PM  Recent Labs  Lab 06/18/24 0201 06/19/24 0304 06/20/24 0305  06/21/24 0512 06/22/24 0457 06/23/24 1159 06/23/24 1809 06/24/24 0604  HGB 12.0 11.3* 11.2* 10.2*  --  11.5*  --   --   ALBUMIN 3.2* 2.2*  --   --   --   --   --   --   CALCIUM  8.7* 8.0* 7.7* 7.7*   < >  --  8.2* 8.0*  PHOS  --  4.6 4.8*  --   --   --   --   --   CREATININE 3.61* 4.03* 4.62* 5.24*   < >  --  6.40* 6.67*  K 3.5 3.4* 4.1 3.1*   < >  --  3.5 3.5   < > = values in this interval not displayed.   No results for input(s): IRON, TIBC, FERRITIN in the last 168 hours. Inpatient medications:  aspirin  EC  81 mg Oral Daily   buPROPion   150 mg Oral q morning   Chlorhexidine  Gluconate Cloth  6 each Topical Daily   gabapentin   100 mg Oral QHS   insulin  aspart  0-20 Units Subcutaneous TID WC   insulin  aspart  0-5 Units Subcutaneous QHS   levothyroxine   75 mcg Oral Q0600   pantoprazole   40 mg Oral Daily   rosuvastatin   10 mg Oral Q breakfast   sertraline   100 mg Oral Daily   tamsulosin   0.4 mg Oral QPC  supper     ceFAZolin  (ANCEF ) IV     HYDROmorphone  (DILAUDID ) injection, mouth rinse, oxyCODONE , polyethylene glycol, prochlorperazine 

## 2024-06-24 NOTE — Progress Notes (Signed)
 Progress Note   Patient: Sue Davidson FMW:[id number] DOB: 1951-02-22 DOA: 06/18/2024     6 DOS: the patient was seen and examined on 06/24/2024   Brief hospital course: 73 y.o. female with medical history significant of anemia,  anxiety, depression, bilateral carotid arterial disease, type 2 diabetes, class II obesity, GERD, grade 1 diastolic dysfunction,  pulmonary artery hypertension, hyperlipidemia,  sleep apnea,  history of urolithiasis presented to the ED with left flank pain.  In the ED patient was febrile tachycardic tachypneic, hypotensive and required supplemental oxygen.  Urinalysis showed abnormal urinalysis with more than 50 white cells.  COVID influenza and RSV was negative.  CBC showed elevated WBC at 14.8 with 85% neutrophils.  Lactic acid was initially elevated at 5.9 followed by 4.6.  CMP showed hyponatremia with sodium of 130 and creatinine elevation at 3.6 from baseline 0.8.  AST and ALT mildly elevated.  CT renal study showed left hydroureteronephrosis with 7 mm calculus in the proximal left ureter.  She had multiple falls at home after having generalized weakness.    Sepsis protocol ordered and urology was consulted.  Patient was given IV Rocephin  and vasopressors initially.  Patient was then seen by urology and underwent cystoscopy with left ureteral stent placement on 8/11.  8/16.  Creatinine continues to rise at 6.38.  Nephrology stopped IV fluid hydration. 8/17.  Creatinine up at 6.67 today.  Patient still making urine.  No nausea or vomiting but poor appetite.  Assessment and Plan: * Severe sepsis (HCC) Present on admission with fever and leukocytosis.  Patient has bacteremia with E. coli from urine source.  Patient with acute kidney injury.  Currently on cefazolin .  Acute kidney injury (nontraumatic) (HCC) Creatinine continues to rise on a daily basis.  Today's creatinine 6.  6 7.  Nephrology following.  IV fluids discontinued on 8/16.  Continue to hold home losartan.   Nephrology gave 1 dose of Lasix  on 8/16.  Acute unilateral obstructive uropathy Urology placed a stent in the left ureter on 8/11.  Patient has left ureteral Calculus and sepsis.  Thrombocytopenia (HCC) Likely secondary to sepsis.  Last platelet count normal range at 193.  Essential hypertension Antihypertensives on hold  Hyponatremia Last sodium 134.  Obesity, Class III, BMI 40-49.9 (morbid obesity) BMI 41.77  Dyslipidemia On Crestor   Gastroesophageal reflux disease On PPI  Hypothyroidism Continue Synthroid   OSA (obstructive sleep apnea) Not on CPAP  Uncontrolled type 2 diabetes mellitus with hyperglycemia, with long-term current use of insulin  (HCC) Sugars on the lower side.  With creatinine rising, I will hold the long-acting insulin  and just do sliding scale for right now.  Depression On Wellbutrin         Subjective: Patient feels okay.  Poor appetite.  No nausea or vomiting.  Admitted with sepsis.  Physical Exam: Vitals:   06/23/24 0736 06/23/24 1239 06/23/24 1700 06/23/24 2050  BP:  (!) 149/60  (!) 151/70  Pulse:  71  72  Resp:  18    Temp:  97.9 F (36.6 C)  97.9 F (36.6 C)  TempSrc:  Oral  Oral  SpO2:  99%  100%  Weight: 117.4 kg  117.4 kg   Height:       Physical Exam HENT:     Head: Normocephalic.  Eyes:     General: Lids are normal.     Conjunctiva/sclera: Conjunctivae normal.  Cardiovascular:     Rate and Rhythm: Normal rate and regular rhythm.     Heart sounds: Normal  heart sounds, S1 normal and S2 normal.  Pulmonary:     Breath sounds: Examination of the right-lower field reveals decreased breath sounds. Examination of the left-lower field reveals decreased breath sounds. Decreased breath sounds present. No wheezing, rhonchi or rales.  Abdominal:     Palpations: Abdomen is soft.     Tenderness: There is no abdominal tenderness.  Musculoskeletal:     Right lower leg: No swelling.     Left lower leg: No swelling.  Skin:     General: Skin is warm.     Findings: No rash.  Neurological:     Mental Status: She is alert and oriented to person, place, and time.     Data Reviewed: Sodium 134, creatinine 6.67, GFR 6  Family Communication: Brother at bedside  Disposition: Status is: Inpatient Remains inpatient appropriate because: Creatinine continues to rise.  Nephrology following on whether dialysis will be needed or not.  Planned Discharge Destination: Rehab    Time spent: 28 minutes  Author: Charlie Patterson, MD 06/24/2024 11:22 AM  For on call review [email or web address].

## 2024-06-24 NOTE — Plan of Care (Signed)

## 2024-06-24 NOTE — Assessment & Plan Note (Signed)
 Sugars on the lower side.  With creatinine rising, I will hold the long-acting insulin  and just do sliding scale for right now.

## 2024-06-24 NOTE — Progress Notes (Signed)
 PHARMACY NOTE:  ANTIMICROBIAL RENAL DOSAGE ADJUSTMENT  Current antimicrobial regimen includes a mismatch between antimicrobial dosage and estimated renal function.  As per policy approved by the Pharmacy & Therapeutics and Medical Executive Committees, the antimicrobial dosage will be adjusted accordingly.  Current antimicrobial dosage:  Cefazolin  1g IV q12h  Indication: Ecoli urosepsis, bacteremia  Renal Function: Estimated Creatinine Clearance: 9.6 mL/min (A) (by C-G formula based on SCr of 6.67 mg/dL (H)). []      On intermittent HD, scheduled: []      On CRRT    Antimicrobial dosage has been changed to:  Cefazolin  1g IV q24h   Thank you for allowing pharmacy to be a part of this patient's care.  Wanda Hasting PharmD, BCPS [facility] main pharmacy [phone] 06/24/2024 7:35 AM

## 2024-06-24 NOTE — Assessment & Plan Note (Signed)
 On Wellbutrin

## 2024-06-25 DIAGNOSIS — R6520 Severe sepsis without septic shock: Secondary | ICD-10-CM | POA: Diagnosis not present

## 2024-06-25 DIAGNOSIS — A419 Sepsis, unspecified organism: Secondary | ICD-10-CM | POA: Diagnosis not present

## 2024-06-25 LAB — BASIC METABOLIC PANEL WITH GFR
Anion gap: 15 (ref 5–15)
Anion gap: 15 (ref 5–15)
BUN: 81 mg/dL — ABNORMAL HIGH (ref 8–23)
BUN: 84 mg/dL — ABNORMAL HIGH (ref 8–23)
CO2: 16 mmol/L — ABNORMAL LOW (ref 22–32)
CO2: 17 mmol/L — ABNORMAL LOW (ref 22–32)
Calcium: 8 mg/dL — ABNORMAL LOW (ref 8.9–10.3)
Calcium: 8 mg/dL — ABNORMAL LOW (ref 8.9–10.3)
Chloride: 106 mmol/L (ref 98–111)
Chloride: 104 mmol/L (ref 98–111)
Creatinine, Ser: 7.17 mg/dL — ABNORMAL HIGH (ref 0.44–1.00)
Creatinine, Ser: 7.33 mg/dL — ABNORMAL HIGH (ref 0.44–1.00)
GFR, Estimated: 6 mL/min — ABNORMAL LOW (ref >60)
GFR, Estimated: 5 mL/min — ABNORMAL LOW (ref >60)
Glucose, Bld: 86 mg/dL (ref 70–99)
Glucose, Bld: 96 mg/dL (ref 70–99)
Potassium: 3.6 mmol/L (ref 3.5–5.1)
Potassium: 3.9 mmol/L (ref 3.5–5.1)
Sodium: 137 mmol/L (ref 135–145)
Sodium: 136 mmol/L (ref 135–145)

## 2024-06-25 LAB — GLUCOSE, CAPILLARY
Glucose-Capillary: 83 mg/dL (ref 70–99)
Glucose-Capillary: 89 mg/dL (ref 70–99)
Glucose-Capillary: 96 mg/dL (ref 70–99)
Glucose-Capillary: 114 mg/dL — ABNORMAL HIGH (ref 70–99)

## 2024-06-25 MED ORDER — FUROSEMIDE 10 MG/ML IJ SOLN: 80.0000 mg | Freq: Two times a day (BID) | INTRAMUSCULAR | Status: DC

## 2024-06-25 MED ORDER — FUROSEMIDE 10 MG/ML IJ SOLN
80.0000 mg | Freq: Three times a day (TID) | INTRAMUSCULAR | Status: DC
  Administered 2024-06-25 – 2024-06-27 (×7): 80 mg via INTRAVENOUS
  Filled 2024-06-25 (×7): qty 8

## 2024-06-25 MED ORDER — CEPHALEXIN 500 MG PO CAPS
500.0000 mg | ORAL_CAPSULE | ORAL | Status: AC
  Administered 2024-06-25 – 2024-06-27 (×3): 500 mg via ORAL
  Filled 2024-06-25 (×3): qty 1

## 2024-06-25 NOTE — [place] FL2 (Signed)
 [place]  MEDICAID FL2 LEVEL OF CARE FORM     IDENTIFICATION  Patient Name: Sue Davidson Birthdate: 1951/05/26 Sex: female Admission Date (Current Location): 06/18/2024  [facility] and [place] Number:  Producer, television/film/video and Address:  [facility],  [address] [place], [place] [address]      Provider Number: [id number]  Attending Physician Name and Address:  Jillian Buttery, MD  Relative Name and Phone Number:  [facility]ollen [facility])  [phone]    Current Level of Care: Hospital Recommended Level of Care: Skilled Nursing Facility Prior Approval Number:    Date Approved/Denied:   PASRR Number: Pending  Discharge Plan: SNF    Current Diagnoses: Patient Active Problem List   Diagnosis Date Noted   Uncontrolled type 2 diabetes mellitus with hyperglycemia, with long-term current use of insulin  (HCC) 06/24/2024   E coli bacteremia 06/24/2024   Severe sepsis (HCC) 06/23/2024   Acute unilateral obstructive uropathy 06/23/2024   Obesity, Class III, BMI 40-49.9 (morbid obesity) 06/23/2024   Thrombocytopenia (HCC) 06/23/2024   Gastroesophageal reflux disease 06/18/2024   Acute UTI (urinary tract infection) 06/18/2024   Urolithiasis 06/18/2024   Hydronephrosis of left kidney 06/18/2024   Depression 06/18/2024   Hypothyroidism 06/18/2024   CHF (congestive heart failure) (HCC) 09/24/2020   History of gout 12/20/2018   Lumbar radiculopathy 12/19/2018   Carpal tunnel syndrome 11/28/2017   Chest pain 04/14/2017   Acute kidney injury (nontraumatic) (HCC) 04/14/2017   Hyponatremia 04/14/2017   Carotid disease, bilateral (HCC) 10/06/2016   Normal coronary arteries 10/06/2016   Lateral meniscal tear 06/29/2016   Pulmonary artery hypertension (HCC) 04/23/2016   Exertional dyspnea 01/26/2016   Bilateral leg edema 01/26/2016   Morbid obesity (HCC) 01/26/2016   Dyslipidemia 01/26/2016   Chronic cholecystitis 10/25/2013   DYSPNEA 06/05/2009   Insulin  dependent diabetes  mellitus (HCC) 06/04/2009   OSA (obstructive sleep apnea) 06/04/2009   Essential hypertension 06/04/2009   EDEMA LEG 06/04/2009    Orientation RESPIRATION BLADDER Height & Weight     Self, Time, Situation, Place  Normal Incontinent Weight: 259 lb 4.2 oz (117.6 kg) Height:  5' 6 (167.6 cm)  BEHAVIORAL SYMPTOMS/MOOD NEUROLOGICAL BOWEL NUTRITION STATUS      Continent Diet (Low Sodium Heart Healthy)  AMBULATORY STATUS COMMUNICATION OF NEEDS Skin   Extensive Assist Verbally Surgical wounds (Wound 06/18/24 0727 Surgical Closed Surgical Incision Vagina)                       Personal Care Assistance Level of Assistance  Bathing, Feeding, Dressing Bathing Assistance: Limited assistance Feeding assistance: Independent Dressing Assistance: Limited assistance     Functional Limitations Info  Sight, Hearing, Speech Sight Info: Adequate Hearing Info: Adequate Speech Info: Adequate    SPECIAL CARE FACTORS FREQUENCY  PT (By licensed PT), OT (By licensed OT)     PT Frequency: 5x per week OT Frequency: 5x per week            Contractures Contractures Info: Not present    Additional Factors Info  Code Status, Allergies Code Status Info: FULL Allergies Info: Actos (Pioglitazone), Codeine, Ms Contin  (Morphine ), Neurontin  (Gabapentin ), Ozempic (0.25 Or 0.5 Mg-dose) (Semaglutide(0.25 Or 0.5mg -dos)), Tape           Current Medications (06/25/2024):  This is the current hospital active medication list Current Facility-Administered Medications  Medication Dose Route Frequency Provider Last Rate Last Admin   aspirin  EC tablet 81 mg  81 mg Oral Daily Celinda Alm Lot,  MD   81 mg at 06/25/24 0908   buPROPion  (WELLBUTRIN  XL) 24 hr tablet 150 mg  150 mg Oral q morning Celinda Alm Lot, MD   150 mg at 06/25/24 9091   cephALEXin  (KEFLEX ) capsule 500 mg  500 mg Oral Q24H Jillian Buttery, MD       Chlorhexidine  Gluconate Cloth 2 % PADS 6 each  6 each Topical Daily Renda Glance,  MD   6 each at 06/25/24 9092   furosemide  (LASIX ) injection 80 mg  80 mg Intravenous Q8H Geralynn Charleston, MD   80 mg at 06/25/24 1503   gabapentin  (NEURONTIN ) capsule 100 mg  100 mg Oral QHS Celinda Alm Lot, MD       HYDROmorphone  (DILAUDID ) injection 0.5 mg  0.5 mg Intravenous Q4H PRN Shona Terry SAILOR, DO       insulin  aspart (novoLOG ) injection 0-20 Units  0-20 Units Subcutaneous TID WC Daniels, James K, NP   3 Units at 06/23/24 1714   insulin  aspart (novoLOG ) injection 0-5 Units  0-5 Units Subcutaneous QHS Daniels, James K, NP       levothyroxine  (SYNTHROID ) tablet 75 mcg  75 mcg Oral Q0600 Celinda Alm Lot, MD   75 mcg at 06/25/24 9441   Oral care mouth rinse  15 mL Mouth Rinse PRN Renda Glance, MD       oxyCODONE  (Oxy IR/ROXICODONE ) immediate release tablet 5 mg  5 mg Oral Q6H PRN Shona Terry SAILOR, DO       pantoprazole  (PROTONIX ) EC tablet 40 mg  40 mg Oral Daily Celinda Alm Lot, MD   40 mg at 06/25/24 0908   polyethylene glycol (MIRALAX  / GLYCOLAX ) packet 17 g  17 g Oral Daily PRN Shona Terry N, DO       prochlorperazine  (COMPAZINE ) injection 5 mg  5 mg Intravenous Q6H PRN Shona Terry N, DO   5 mg at 06/22/24 9140   rosuvastatin  (CRESTOR ) tablet 10 mg  10 mg Oral Q breakfast Celinda Alm Lot, MD   10 mg at 06/25/24 0908   sertraline  (ZOLOFT ) tablet 100 mg  100 mg Oral Daily Celinda Alm Lot, MD   100 mg at 06/25/24 9091   tamsulosin  (FLOMAX ) capsule 0.4 mg  0.4 mg Oral QPC supper Celinda Alm Lot, MD   0.4 mg at 06/24/24 1655     Discharge Medications: Please see discharge summary for a list of discharge medications.  Relevant Imaging Results:  Relevant Lab Results:   Additional Information SSN: [id number]  Heather LABOR Lilliona Blakeney, LCSW

## 2024-06-25 NOTE — Plan of Care (Signed)

## 2024-06-25 NOTE — Progress Notes (Signed)
  [facility] Progress Note  Subjective:  UOP yest 2100 Creat up 7.1 today Feels better, wants to walk today  Vitals:   06/24/24 1158 06/24/24 2009 06/25/24 0407 06/25/24 0414  BP: (!) 155/67 (!) 151/59 (!) 157/50   Pulse: 74 81 80   Resp: 16 20    Temp: 97.7 F (36.5 C) 98 F (36.7 C) 98.4 F (36.9 C)   TempSrc: Oral Oral Oral   SpO2: 100% 97% 97%   Weight:    117.6 kg  Height:        Exam: Gen: NAD CVS: RRR Resp:CTA Abd: +BS, soft, NT/ND Ext: diffuse 2+ bilat LE edema  Home bp meds:  Norvasc  2.5 every day Furosemide  80mg  every day Olmesartan  40mg  every day Others: asa, zoloft , mounjaro, crestor , PPI, T4, insulin , wellbutrin  and flomax     Assessment/ Plan: AKI - multifactorial with ischemic ATN in setting of urosepsis, volume depletion, and concomitant ARB, as well as left sided hydronephrosis.  Despite stent placement, creat continued to climb, however her blood pressure was also low and required stopping several agents. F/u renal US  on 8/14 showed no hydronephrosis post stent placement on 8/11.  Continuing to hold ARB/ lasix . UOP much better last few days however creat up today again to 7.1. Significant LE edema, will do trial of IV lasix . Will need HD if becomes symptomatic, but no need at this time.  E. Coli urosepsis - getting IV ancef  Urolithiasis with hydronephrosis of left kidney - s/p cystoscopy and left ureteral stent placement on 06/18/24.  F/u US  8/14 was neg for obstruction.  Volume overload: remains edematous in the LE's, UOP improved.  HTN - bp's were low and home meds are on hold. Current bp's 140/70 range, stable.      Myer Fret MD  [facility] 06/25/2024, 9:42 AM  Recent Labs  Lab 06/19/24 0304 06/20/24 0305 06/21/24 0512 06/22/24 0457 06/23/24 1159 06/23/24 1809 06/24/24 0604 06/25/24 0506  HGB 11.3* 11.2* 10.2*  --  11.5*  --   --   --   ALBUMIN 2.2*  --   --   --   --   --   --   --   CALCIUM  8.0* 7.7* 7.7*   < >  --    < >  8.0* 8.0*  PHOS 4.6 4.8*  --   --   --   --   --   --   CREATININE 4.03* 4.62* 5.24*   < >  --    < > 6.67* 7.17*  K 3.4* 4.1 3.1*   < >  --    < > 3.5 3.6   < > = values in this interval not displayed.   No results for input(s): IRON, TIBC, FERRITIN in the last 168 hours. Inpatient medications:  aspirin  EC  81 mg Oral Daily   buPROPion   150 mg Oral q morning   Chlorhexidine  Gluconate Cloth  6 each Topical Daily   furosemide   80 mg Intravenous Q8H   gabapentin   100 mg Oral QHS   insulin  aspart  0-20 Units Subcutaneous TID WC   insulin  aspart  0-5 Units Subcutaneous QHS   levothyroxine   75 mcg Oral Q0600   pantoprazole   40 mg Oral Daily   rosuvastatin   10 mg Oral Q breakfast   sertraline   100 mg Oral Daily   tamsulosin   0.4 mg Oral QPC supper     ceFAZolin  (ANCEF ) IV 1 g (06/24/24 2230)   HYDROmorphone  (DILAUDID ) injection, mouth  rinse, oxyCODONE , polyethylene glycol, prochlorperazine 

## 2024-06-25 NOTE — Progress Notes (Signed)
 PROGRESS NOTE  Elissia Spiewak  FMW:[id number] DOB: 1951-09-01 DOA: 06/18/2024 PCP: Clarice Nottingham, MD   Brief Narrative: Patient is 73 y.o. female with medical history significant of anemia,  anxiety, depression, bilateral carotid arterial disease, type 2 diabetes, class II obesity, GERD, grade 1 diastolic dysfunction,  pulmonary artery hypertension, hyperlipidemia,  sleep apnea,  history of urolithiasis presented to the ED with left flank pain.  In the ED patient was febrile tachycardic tachypneic, hypotensive and required supplemental oxygen.  Urinalysis showed abnormal urinalysis with more than 50 white cells.  COVID influenza and RSV was negative.  CBC showed elevated WBC at 14.8 with 85% neutrophils.  Lactic acid was initially elevated at 5.9 followed by 4.6.  CMP showed hyponatremia with sodium of 130 and creatinine elevation at 3.6 from baseline 0.8.  AST and ALT mildly elevated.  CT renal study showed left hydroureteronephrosis with 7 mm calculus in the proximal left ureter.  She had multiple falls at home after having generalized weakness.    Sepsis protocol ordered and urology was consulted.  Patient was given IV Rocephin  and vasopressors initially.  Patient was then seen by urology and underwent cystoscopy with left ureteral stent placement on 8/11.  Hospital course remarkable for worsening renal function.  Nephrology consulted.  Assessment & Plan:  Principal Problem:   Severe sepsis (HCC) Active Problems:   Acute kidney injury (nontraumatic) (HCC)   Acute unilateral obstructive uropathy   Essential hypertension   Thrombocytopenia (HCC)   Hyponatremia   Obesity, Class III, BMI 40-49.9 (morbid obesity)   Dyslipidemia   Gastroesophageal reflux disease   OSA (obstructive sleep apnea)   Hypothyroidism   Acute UTI (urinary tract infection)   Urolithiasis   Hydronephrosis of left kidney   Depression   Uncontrolled type 2 diabetes mellitus with hyperglycemia, with long-term current use  of insulin  (HCC)   E coli bacteremia   Severe sepsis secondary to E. coli bacteremia:Presented with fever, leukocytosis.  Blood cultures/urine cultures showing pansensitive E. coli.  Currently on cefazolin   AKI; Baseline creatinine normal.  Secondary to sepsis, hypotension, she was also on ARB at home.  Creatinine in the range of 7.  Nephrology closely following.  Being given IV diuretics today because of volume excess/LE edema.  No plan for dialysis.  No signs of uremia  Acute unilateral obstructive uropathy: Status post stent placement in the left ureter on 8/11.  Had left ureteral calculus  Hypertension: Currently blood pressure stable.  She was hypertensive on presentation.  Antihypertensives on hold  Thrombocytopenia: Likely from sepsis, resolved  Obesity: BMI of 41.7  Hyperlipidemia: Takes Crestor  at home  GERD: Continue PPI  Hypothyroidism: Continue Synthyroid  OSA: Not on CPAP  Uncontrolled diabetes type 2: Currently on sliding scale.  Depression: oN Wellbutrin   Debility/deconditioning: PT recommending SNF on discharge.  TOC consulted            DVT prophylaxis:SCDs Start: 06/18/24 9375     Code Status: Full Code  Family Communication: Brother at bedside  Patient status:Inpatient  Patient is from :home  Anticipated discharge un:ynfz  Estimated DC date: After improvement in the renal function, nephrology clearance   Consultants: Urology, nephrology  Procedures: Stent placement  Antimicrobials:  Anti-infectives (From admission, onward)    Start     Dose/Rate Route Frequency Ordered Stop   06/24/24 2200  ceFAZolin  (ANCEF ) IVPB 1 g/50 mL premix        1 g 100 mL/hr over 30 Minutes Intravenous Every 24 hours 06/24/24 0737  06/23/24 1000  ceFAZolin  (ANCEF ) IVPB 1 g/50 mL premix  Status:  Discontinued        1 g 100 mL/hr over 30 Minutes Intravenous Every 12 hours 06/23/24 0855 06/24/24 0737   06/20/24 1030  ceFAZolin  (ANCEF ) IVPB 2g/100 mL  premix  Status:  Discontinued        2 g 200 mL/hr over 30 Minutes Intravenous Every 12 hours 06/20/24 0943 06/23/24 0855   06/19/24 0200  cefTRIAXone  (ROCEPHIN ) 2 g in sodium chloride  0.9 % 100 mL IVPB  Status:  Discontinued        2 g 200 mL/hr over 30 Minutes Intravenous Every 24 hours 06/18/24 0622 06/20/24 0943   06/18/24 0215  cefTRIAXone  (ROCEPHIN ) 2 g in sodium chloride  0.9 % 100 mL IVPB        2 g 200 mL/hr over 30 Minutes Intravenous Once 06/18/24 0205 06/18/24 0258       Subjective: Patient seen and examined at bedside today.  Hemodynamically stable.  Overall comfortable complaints.  No shortness of breath, cough, chest pain, nausea or vomiting.  Has lower extremity edema.  Objective: Vitals:   06/24/24 1158 06/24/24 2009 06/25/24 0407 06/25/24 0414  BP: (!) 155/67 (!) 151/59 (!) 157/50   Pulse: 74 81 80   Resp: 16 20    Temp: 97.7 F (36.5 C) 98 F (36.7 C) 98.4 F (36.9 C)   TempSrc: Oral Oral Oral   SpO2: 100% 97% 97%   Weight:    117.6 kg  Height:        Intake/Output Summary (Last 24 hours) at 06/25/2024 1025 Last data filed at 06/25/2024 0414 Gross per 24 hour  Intake 50 ml  Output 1550 ml  Net -1500 ml   Filed Weights   06/23/24 0736 06/23/24 1700 06/25/24 0414  Weight: 117.4 kg 117.4 kg 117.6 kg    Examination:  General exam: Overall comfortable, not in distress, morbidly obese HEENT: PERRL Respiratory system:  no wheezes or crackles  Cardiovascular system: S1 & S2 heard, RRR.  Gastrointestinal system: Abdomen is nondistended, soft and nontender. Central nervous system: Alert and oriented Extremities: Bilateral lower extremity pitting edema, no clubbing ,no cyanosis Skin: No rashes, no ulcers,no icterus     Data Reviewed: I have personally reviewed following labs and imaging studies  CBC: Recent Labs  Lab 06/19/24 0304 06/20/24 0305 06/21/24 0512 06/23/24 1159  WBC 13.2* 13.9* 10.2 12.1*  NEUTROABS  --   --   --  8.7*  HGB 11.3*  11.2* 10.2* 11.5*  HCT 35.6* 35.1* 32.0* 36.4  MCV 94.4 93.6 92.0 92.9  PLT 123* 117* 128* 193   Basic Metabolic Panel: Recent Labs  Lab 06/19/24 0304 06/20/24 0305 06/21/24 0512 06/22/24 0457 06/23/24 0548 06/23/24 1809 06/24/24 0604 06/25/24 0506  NA 132* 130*   < > 131* 132* 134* 134* 137  K 3.4* 4.1   < > 3.2* 3.7 3.5 3.5 3.6  CL 96* 96*   < > 100 102 102 104 106  CO2 23 20*   < > 17* 18* 17* 16* 16*  GLUCOSE 208* 225*   < > 92 105* 133* 89 86  BUN 61* 70*   < > 74* 72* 75* 77* 81*  CREATININE 4.03* 4.62*   < > 5.79* 6.38* 6.40* 6.67* 7.17*  CALCIUM  8.0* 7.7*   < > 7.6* 7.8* 8.2* 8.0* 8.0*  MG 2.0 2.1  --   --   --   --   --   --  PHOS 4.6 4.8*  --   --   --   --   --   --    < > = values in this interval not displayed.     Recent Results (from the past 240 hours)  Blood Culture (routine x 2)     Status: Abnormal   Collection Time: 06/18/24  2:00 AM   Specimen: BLOOD  Result Value Ref Range Status   Specimen Description   Final    BLOOD RIGHT ANTECUBITAL Performed at [facility], [address] [place], [place] [address]    Special Requests   Final    BOTTLES DRAWN AEROBIC AND ANAEROBIC Blood Culture adequate volume Performed at [facility], [address] [place], [place] [address]    Culture  Setup Time   Final    GRAM NEGATIVE RODS IN BOTH AEROBIC AND ANAEROBIC BOTTLES CRITICAL VALUE NOTED.  VALUE IS CONSISTENT WITH PREVIOUSLY REPORTED AND CALLED VALUE.    Culture (A)  Final    ESCHERICHIA COLI SUSCEPTIBILITIES PERFORMED ON PREVIOUS CULTURE WITHIN THE LAST 5 DAYS. Performed at [facility], [address], [place], [place] [address]    Report Status 06/21/2024 FINAL  Final  Blood Culture (routine x 2)     Status: Abnormal   Collection Time: 06/18/24  2:10 AM   Specimen: BLOOD LEFT WRIST  Result Value Ref Range Status   Specimen Description   Final    BLOOD LEFT WRIST Performed at [facility], [address] [place], [place] [address]    Special Requests   Final    BOTTLES DRAWN AEROBIC AND ANAEROBIC Blood Culture adequate volume Performed at [facility], [address] [place], [place] [address]    Culture  Setup Time   Final    GRAM NEGATIVE RODS IN BOTH AEROBIC AND ANAEROBIC BOTTLES CRITICAL RESULT CALLED TO, READ BACK BY AND VERIFIED WITH: PHARMD L POINTDEXTER 06/19/2024 @ 0022 BY AB Performed at [facility], [address] [place], [place] [address]    Culture ESCHERICHIA COLI (A)  Final   Report Status 06/20/2024 FINAL  Final   Organism ID, Bacteria ESCHERICHIA COLI  Final      Susceptibility   Escherichia coli - MIC*    AMPICILLIN 4 SENSITIVE Sensitive     CEFAZOLIN  Value in next row Sensitive      <=1 SENSITIVEThis is a modified FDA-approved test that has been validated and its performance characteristics determined by the reporting laboratory.  This laboratory is certified under the Clinical Laboratory Improvement Amendments CLIA as qualified to perform high complexity clinical laboratory testing.    CEFEPIME Value in next row Sensitive      <=1 SENSITIVEThis is a modified FDA-approved test that has been validated and its performance characteristics determined by the reporting laboratory.  This laboratory is certified under the Clinical Laboratory Improvement Amendments CLIA as qualified to perform high complexity clinical laboratory testing.    ERTAPENEM Value in next row Sensitive      <=1 SENSITIVEThis is a modified FDA-approved test that has been validated and its performance characteristics determined by the reporting laboratory.  This laboratory is certified under the Clinical Laboratory Improvement Amendments CLIA as qualified to perform high complexity clinical laboratory testing.    CEFTRIAXONE  Value in next row Sensitive      <=1 SENSITIVEThis is a modified FDA-approved test that has been validated and its performance characteristics  determined by the  reporting laboratory.  This laboratory is certified under the Clinical Laboratory Improvement Amendments CLIA as qualified to perform high complexity clinical laboratory testing.    CIPROFLOXACIN Value in next row Sensitive      <=1 SENSITIVEThis is a modified FDA-approved test that has been validated and its performance characteristics determined by the reporting laboratory.  This laboratory is certified under the Clinical Laboratory Improvement Amendments CLIA as qualified to perform high complexity clinical laboratory testing.    GENTAMICIN Value in next row Sensitive      <=1 SENSITIVEThis is a modified FDA-approved test that has been validated and its performance characteristics determined by the reporting laboratory.  This laboratory is certified under the Clinical Laboratory Improvement Amendments CLIA as qualified to perform high complexity clinical laboratory testing.    MEROPENEM Value in next row Sensitive      <=1 SENSITIVEThis is a modified FDA-approved test that has been validated and its performance characteristics determined by the reporting laboratory.  This laboratory is certified under the Clinical Laboratory Improvement Amendments CLIA as qualified to perform high complexity clinical laboratory testing.    TRIMETH/SULFA Value in next row Sensitive      <=1 SENSITIVEThis is a modified FDA-approved test that has been validated and its performance characteristics determined by the reporting laboratory.  This laboratory is certified under the Clinical Laboratory Improvement Amendments CLIA as qualified to perform high complexity clinical laboratory testing.    AMPICILLIN/SULBACTAM Value in next row Sensitive      <=1 SENSITIVEThis is a modified FDA-approved test that has been validated and its performance characteristics determined by the reporting laboratory.  This laboratory is certified under the Clinical Laboratory Improvement Amendments CLIA as qualified to perform high complexity  clinical laboratory testing.    PIP/TAZO Value in next row Sensitive ug/mL     <=4 SENSITIVEThis is a modified FDA-approved test that has been validated and its performance characteristics determined by the reporting laboratory.  This laboratory is certified under the Clinical Laboratory Improvement Amendments CLIA as qualified to perform high complexity clinical laboratory testing.    * ESCHERICHIA COLI  Blood Culture ID Panel (Reflexed)     Status: Abnormal   Collection Time: 06/18/24  2:10 AM  Result Value Ref Range Status   Enterococcus faecalis NOT DETECTED NOT DETECTED Final   Enterococcus Faecium NOT DETECTED NOT DETECTED Final   Listeria monocytogenes NOT DETECTED NOT DETECTED Final   Staphylococcus species NOT DETECTED NOT DETECTED Final   Staphylococcus aureus (BCID) NOT DETECTED NOT DETECTED Final   Staphylococcus epidermidis NOT DETECTED NOT DETECTED Final   Staphylococcus lugdunensis NOT DETECTED NOT DETECTED Final   Streptococcus species NOT DETECTED NOT DETECTED Final   Streptococcus agalactiae NOT DETECTED NOT DETECTED Final   Streptococcus pneumoniae NOT DETECTED NOT DETECTED Final   Streptococcus pyogenes NOT DETECTED NOT DETECTED Final   A.calcoaceticus-baumannii NOT DETECTED NOT DETECTED Final   Bacteroides fragilis NOT DETECTED NOT DETECTED Final   Enterobacterales DETECTED (A) NOT DETECTED Final    Comment: Enterobacterales represent a large order of gram negative bacteria, not a single organism. CRITICAL RESULT CALLED TO, READ BACK BY AND VERIFIED WITH: PHARMD L POINTDEXTER 06/19/2024 @ 0022 BY AB    Enterobacter cloacae complex NOT DETECTED NOT DETECTED Final   Escherichia coli DETECTED (A) NOT DETECTED Final    Comment: CRITICAL RESULT CALLED TO, READ BACK BY AND VERIFIED WITH: PHARMD L POINTDEXTER 06/19/2024 @ 0022 BY AB    Klebsiella aerogenes NOT DETECTED NOT  DETECTED Final   Klebsiella oxytoca NOT DETECTED NOT DETECTED Final   Klebsiella pneumoniae NOT  DETECTED NOT DETECTED Final   Proteus species NOT DETECTED NOT DETECTED Final   Salmonella species NOT DETECTED NOT DETECTED Final   Serratia marcescens NOT DETECTED NOT DETECTED Final   Haemophilus influenzae NOT DETECTED NOT DETECTED Final   Neisseria meningitidis NOT DETECTED NOT DETECTED Final   Pseudomonas aeruginosa NOT DETECTED NOT DETECTED Final   Stenotrophomonas maltophilia NOT DETECTED NOT DETECTED Final   Candida albicans NOT DETECTED NOT DETECTED Final   Candida auris NOT DETECTED NOT DETECTED Final   Candida glabrata NOT DETECTED NOT DETECTED Final   Candida krusei NOT DETECTED NOT DETECTED Final   Candida parapsilosis NOT DETECTED NOT DETECTED Final   Candida tropicalis NOT DETECTED NOT DETECTED Final   Cryptococcus neoformans/gattii NOT DETECTED NOT DETECTED Final   CTX-M ESBL NOT DETECTED NOT DETECTED Final   Carbapenem resistance IMP NOT DETECTED NOT DETECTED Final   Carbapenem resistance KPC NOT DETECTED NOT DETECTED Final   Carbapenem resistance NDM NOT DETECTED NOT DETECTED Final   Carbapenem resist OXA 48 LIKE NOT DETECTED NOT DETECTED Final   Carbapenem resistance VIM NOT DETECTED NOT DETECTED Final    Comment: Performed at [facility], [address] [place], [place] [address]  Resp panel by RT-PCR (RSV, Flu A&B, Covid) Anterior Nasal Swab     Status: None   Collection Time: 06/18/24  3:11 AM   Specimen: Anterior Nasal Swab  Result Value Ref Range Status   SARS Coronavirus 2 by RT PCR NEGATIVE NEGATIVE Final    Comment: (NOTE) SARS-CoV-2 target nucleic acids are NOT DETECTED.  The SARS-CoV-2 RNA is generally detectable in upper respiratory specimens during the acute phase of infection. The lowest concentration of SARS-CoV-2 viral copies this assay can detect is 138 copies/mL. A negative result does not preclude SARS-Cov-2 infection and should not be used as the sole basis for treatment or other patient management decisions. A negative result may  occur with  improper specimen collection/handling, submission of specimen other than nasopharyngeal swab, presence of viral mutation(s) within the areas targeted by this assay, and inadequate number of viral copies(<138 copies/mL). A negative result must be combined with clinical observations, patient history, and epidemiological information. The expected result is Negative.  Fact Sheet for Patients:  BloggerCourse.com  Fact Sheet for Healthcare Providers:  SeriousBroker.it  This test is no t yet approved or cleared by the [place]  FDA and  has been authorized for detection and/or diagnosis of SARS-CoV-2 by FDA under an Emergency Use Authorization (EUA). This EUA will remain  in effect (meaning this test can be used) for the duration of the COVID-19 declaration under Section 564(b)(1) of the Act, 21 U.S.C.section 360bbb-3(b)(1), unless the authorization is terminated  or revoked sooner.       Influenza A by PCR NEGATIVE NEGATIVE Final   Influenza B by PCR NEGATIVE NEGATIVE Final    Comment: (NOTE) The Xpert Xpress SARS-CoV-2/FLU/RSV plus assay is intended as an aid in the diagnosis of influenza from Nasopharyngeal swab specimens and should not be used as a sole basis for treatment. Nasal washings and aspirates are unacceptable for Xpert Xpress SARS-CoV-2/FLU/RSV testing.  Fact Sheet for Patients: BloggerCourse.com  Fact Sheet for Healthcare Providers: SeriousBroker.it  This test is not yet approved or cleared by the [place]  FDA and has been authorized for detection and/or diagnosis of SARS-CoV-2 by FDA under an Emergency Use Authorization (EUA). This EUA will  remain in effect (meaning this test can be used) for the duration of the COVID-19 declaration under Section 564(b)(1) of the Act, 21 U.S.C. section 360bbb-3(b)(1), unless the authorization is terminated  or revoked.     Resp Syncytial Virus by PCR NEGATIVE NEGATIVE Final    Comment: (NOTE) Fact Sheet for Patients: BloggerCourse.com  Fact Sheet for Healthcare Providers: SeriousBroker.it  This test is not yet approved or cleared by the [place]  FDA and has been authorized for detection and/or diagnosis of SARS-CoV-2 by FDA under an Emergency Use Authorization (EUA). This EUA will remain in effect (meaning this test can be used) for the duration of the COVID-19 declaration under Section 564(b)(1) of the Act, 21 U.S.C. section 360bbb-3(b)(1), unless the authorization is terminated or revoked.  Performed at [facility], [address], [place], [place] [address]   Urine Culture     Status: Abnormal   Collection Time: 06/18/24  7:24 AM   Specimen: Urine, Cystoscope  Result Value Ref Range Status   Specimen Description   Final    CYSTOSCOPY URINE CYSTOSCOPE, PT ON CEFTRIAXONE  Performed at [facility], [address], [place], [place] [address]    Special Requests   Final    NONE Performed at [facility], [address], [place], [place] [address]    Culture 20,000 COLONIES/mL ESCHERICHIA COLI (A)  Final   Report Status 06/20/2024 FINAL  Final   Organism ID, Bacteria ESCHERICHIA COLI (A)  Final      Susceptibility   Escherichia coli - MIC*    AMPICILLIN 4 SENSITIVE Sensitive     CEFAZOLIN  Value in next row Sensitive      <=1 SENSITIVEThis is a modified FDA-approved test that has been validated and its performance characteristics determined by the reporting laboratory.  This laboratory is certified under the Clinical Laboratory Improvement Amendments CLIA as qualified to perform high complexity clinical laboratory testing.    CEFEPIME Value in next row Sensitive      <=1 SENSITIVEThis is a modified FDA-approved test that has been validated and its performance characteristics  determined by the reporting laboratory.  This laboratory is certified under the Clinical Laboratory Improvement Amendments CLIA as qualified to perform high complexity clinical laboratory testing.    ERTAPENEM Value in next row Sensitive      <=1 SENSITIVEThis is a modified FDA-approved test that has been validated and its performance characteristics determined by the reporting laboratory.  This laboratory is certified under the Clinical Laboratory Improvement Amendments CLIA as qualified to perform high complexity clinical laboratory testing.    CEFTRIAXONE  Value in next row Sensitive      <=1 SENSITIVEThis is a modified FDA-approved test that has been validated and its performance characteristics determined by the reporting laboratory.  This laboratory is certified under the Clinical Laboratory Improvement Amendments CLIA as qualified to perform high complexity clinical laboratory testing.    CIPROFLOXACIN Value in next row Sensitive      <=1 SENSITIVEThis is a modified FDA-approved test that has been validated and its performance characteristics determined by the reporting laboratory.  This laboratory is certified under the Clinical Laboratory Improvement Amendments CLIA as qualified to perform high complexity clinical laboratory testing.    GENTAMICIN Value in next row Sensitive      <=1 SENSITIVEThis is a modified FDA-approved test that has been validated and its performance characteristics determined by the reporting laboratory.  This laboratory is certified under the Clinical Laboratory Improvement Amendments CLIA  as qualified to perform high complexity clinical laboratory testing.    MEROPENEM Value in next row Sensitive      <=1 SENSITIVEThis is a modified FDA-approved test that has been validated and its performance characteristics determined by the reporting laboratory.  This laboratory is certified under the Clinical Laboratory Improvement Amendments CLIA as qualified to perform high complexity  clinical laboratory testing.    TRIMETH/SULFA Value in next row Sensitive      <=1 SENSITIVEThis is a modified FDA-approved test that has been validated and its performance characteristics determined by the reporting laboratory.  This laboratory is certified under the Clinical Laboratory Improvement Amendments CLIA as qualified to perform high complexity clinical laboratory testing.    AMPICILLIN/SULBACTAM Value in next row Sensitive      <=1 SENSITIVEThis is a modified FDA-approved test that has been validated and its performance characteristics determined by the reporting laboratory.  This laboratory is certified under the Clinical Laboratory Improvement Amendments CLIA as qualified to perform high complexity clinical laboratory testing.    PIP/TAZO Value in next row Sensitive ug/mL     <=4 SENSITIVEThis is a modified FDA-approved test that has been validated and its performance characteristics determined by the reporting laboratory.  This laboratory is certified under the Clinical Laboratory Improvement Amendments CLIA as qualified to perform high complexity clinical laboratory testing.    * 20,000 COLONIES/mL ESCHERICHIA COLI  MRSA Next Gen by PCR, Nasal     Status: None   Collection Time: 06/18/24 10:53 AM   Specimen: Nasal Mucosa; Nasal Swab  Result Value Ref Range Status   MRSA by PCR Next Gen NOT DETECTED NOT DETECTED Final    Comment: (NOTE) The GeneXpert MRSA Assay (FDA approved for NASAL specimens only), is one component of a comprehensive MRSA colonization surveillance program. It is not intended to diagnose MRSA infection nor to guide or monitor treatment for MRSA infections. Test performance is not FDA approved in patients less than 68 years old. Performed at [facility], [address], [place], [place] [address]      Radiology Studies: No results found.  Scheduled Meds:  aspirin  EC  81 mg Oral Daily   buPROPion   150 mg Oral q morning   Chlorhexidine   Gluconate Cloth  6 each Topical Daily   furosemide   80 mg Intravenous Q8H   gabapentin   100 mg Oral QHS   insulin  aspart  0-20 Units Subcutaneous TID WC   insulin  aspart  0-5 Units Subcutaneous QHS   levothyroxine   75 mcg Oral Q0600   pantoprazole   40 mg Oral Daily   rosuvastatin   10 mg Oral Q breakfast   sertraline   100 mg Oral Daily   tamsulosin   0.4 mg Oral QPC supper   Continuous Infusions:   ceFAZolin  (ANCEF ) IV 1 g (06/24/24 2230)     LOS: 7 days   Ivonne Mustache, MD [facility] P8/18/2025, 10:25 AM

## 2024-06-25 NOTE — TOC Progression Note (Signed)
 Transition of Care [facility]) - Progression Note    Patient Details  Name: Sue Davidson MRN: [id number] Date of Birth: 1951-04-15  Transition of Care [facility]) CM/SW Contact  Heather DELENA Saltness, LCSW Phone Number: 06/25/2024, 3:55 PM  Clinical Narrative:    CSW met with pt and pt's sister, [name], at bedside to discuss PT's recommendation for short-term SNF rehab. Pt reports she is agreeable to SNF placement. Pt reports preference of SNF being [facility] and [facility]. CSW reached out to [place] at [facility] and [place] at [facility]. CSW completed FL2 and sent referrals to SNF locations in [place] and surrounding areas. Awaiting bed offers. TOC will continue to follow.   Expected Discharge Plan: Home/Self Care Barriers to Discharge: Continued Medical Work up    Expected Discharge Plan and Services SNF   Discharge Planning Services: CM Consult   Living arrangements for the past 2 months: Apartment                         Social Drivers of Health (SDOH) Interventions SDOH Screenings   Food Insecurity: Food Insecurity Present (06/21/2024)  Housing: Low Risk  (06/21/2024)  Transportation Needs: No Transportation Needs (06/21/2024)  Utilities: Not At Risk (06/21/2024)  Social Connections: Moderately Integrated (06/18/2024)  Tobacco Use: Low Risk  (06/18/2024)    Readmission Risk Interventions    06/21/2024    5:03 PM  Readmission Risk Prevention Plan  Transportation Screening Complete  PCP or Specialist Appt within 3-5 Days Complete  HRI or Home Care Consult Complete  Social Work Consult for Recovery Care Planning/Counseling Complete  Palliative Care Screening Not Applicable  Medication Review [facility]) Complete     Signed: Heather Saltness, MSW, LCSW Clinical Social Worker Inpatient Care Management 06/25/2024 4:22 PM

## 2024-06-25 NOTE — TOC PASRR Note (Signed)
 30 Day PASRR Note   Patient Details  Name: Sue Davidson Date of Birth: [birth year]   Transition of Care [facility]) CM/SW Contact:    Heather DELENA Saltness, LCSW Phone Number: 06/25/2024, 5:36 PM  To Whom It May Concern:  Please be advised that this patient will require a short-term nursing home stay - anticipated 30 days or less for rehabilitation and strengthening.   The plan is for return home.

## 2024-06-25 NOTE — Plan of Care (Signed)

## 2024-06-25 NOTE — Progress Notes (Signed)
 Physical Therapy Treatment Patient Details Name: Sue Davidson MRN: [id number] DOB: May 18, 1951 Today's Date: 06/25/2024   History of Present Illness ita Sue Davidson is a 73 y.o. female admitted 06/18/24 with L flank pain, multiple falls, weakness.CT renal study showed left hydroureteronephrosis with 7 mm calculus in the proximal left ureter, S/P cystoscopy with L ureteral stent placed.  PMH: anemia,  anxiety, depression, bilateral carotid arterial disease, type 2 diabetes, class II obesity, GERD, grade 1 diastolic dysfunction,  pulmonary artery hypertension, hyperlipidemia,  sleep apnea,  history of urolithiasis presented to the ED with left flank pain.  In the ED patient was febrile tachycardic tachypneic, hypotensive and required supplemental oxygen.    PT Comments  PT - Cognition Comments: AxO x 3 pleasant Lady who lives home alone.  Was able to amb in her Apartment with NO AD but did use a walker when out limited distances. Assisted OOB to amb was very difficult.  General bed mobility comments: Pt required Max Assist for upper body and Max Assist to guide B LE off bed.  Increased time to scoot to EOB also requiring Max Assist. General transfer comment: Pt required + 2 side by side Max Assist to rise from elevated onto B platform EVA walker.  Heavy lean on walker. General Gait Details: Used EVA walker for Pt's first attempt of amb.  Required + 2 asisst and heavy lean on B platform EVA walker.  Very small short shuffled steps with decreased weight shift to her right.  Right hip is bad.  Limited amb distance of 4 feet due to effort and max c/o fatigue/weakness.  Recliner closely following behind. LPT has rec Pt will need ST Rehab at SNF to address mobility and functional decline prior to safely returning home.    If plan is discharge home, recommend the following: Two people to help with walking and/or transfers;A lot of help with bathing/dressing/bathroom   Can travel by private vehicle     No   Equipment Recommendations  None recommended by PT    Recommendations for Other Services       Precautions / Restrictions Precautions Precautions: Fall Precaution/Restrictions Comments: bad RIGHT hip Restrictions Weight Bearing Restrictions Per Provider Order: No     Mobility  Bed Mobility Overal bed mobility: Needs Assistance Bed Mobility: Supine to Sit     Supine to sit: Max assist, +2 for physical assistance Sit to supine: Total assist, +2 for physical assistance   General bed mobility comments: Pt required Max Assist for upper body and Max Assist to guide B LE off bed.  Increased time to scoot to EOB also requiring Max Assist.    Transfers   Equipment used: Bilateral platform walker               General transfer comment: Pt required + 2 side by side Max Assist to rise from elevated onto B platform EVA walker.  Heavy lean on walker.    Ambulation/Gait Ambulation/Gait assistance: Max assist, Total assist, +2 physical assistance, +2 safety/equipment Gait Distance (Feet): 4 Feet Assistive device: Elyn Finder Gait Pattern/deviations: Step-to pattern, Decreased step length - left, Decreased step length - right, Decreased stance time - right Gait velocity: decreased     General Gait Details: Used EVA walker for Pt's first attempt of amb.  Required + 2 asisst and heavy lean on B platform EVA walker.  Very small short shuffled steps with decreased weight shift to her right.  Right hip is bad.  Limited amb distance  of 4 feet due to effort and max c/o fatigue/weakness.  Recliner closely following behind.   Stairs             Wheelchair Mobility     Tilt Bed    Modified Rankin (Stroke Patients Only)       Balance                                            Communication Communication Communication: No apparent difficulties  Cognition Arousal: Alert Behavior During Therapy: WFL for tasks assessed/performed   PT - Cognitive  impairments: No apparent impairments                       PT - Cognition Comments: AxO x 3 pleasant Lady who lives home alone.  Was able to amb in her Apartment with NO AD but did use a walker when out limited distances. Following commands: Intact      Cueing Cueing Techniques: Verbal cues  Exercises      General Comments        Pertinent Vitals/Pain Pain Assessment Pain Assessment: Faces Faces Pain Scale: Hurts little more Pain Location: right  hip. low back Pain Descriptors / Indicators: Discomfort Pain Intervention(s): Monitored during session, Repositioned    Home Living                          Prior Function            PT Goals (current goals can now be found in the care plan section) Progress towards PT goals: Progressing toward goals    Frequency    Min 2X/week      PT Plan      Co-evaluation              AM-PAC PT 6 Clicks Mobility   Outcome Measure  Help needed turning from your back to your side while in a flat bed without using bedrails?: A Lot Help needed moving from lying on your back to sitting on the side of a flat bed without using bedrails?: A Lot Help needed moving to and from a bed to a chair (including a wheelchair)?: A Lot Help needed standing up from a chair using your arms (e.g., wheelchair or bedside chair)?: A Lot Help needed to walk in hospital room?: A Lot Help needed climbing 3-5 steps with a railing? : Total 6 Click Score: 11    End of Session Equipment Utilized During Treatment: Gait belt Activity Tolerance: Patient limited by fatigue Patient left: in chair;with call bell/phone within reach;with family/visitor present Nurse Communication: Mobility status PT Visit Diagnosis: Unsteadiness on feet (R26.81);Other abnormalities of gait and mobility (R26.89);Muscle weakness (generalized) (M62.81);History of falling (Z91.81);Pain Pain - Right/Left: Right Pain - part of body: Hip     Time:  8866-8854 PT Time Calculation (min) (ACUTE ONLY): 12 min  Charges:    $Gait Training: 8-22 mins PT General Charges $$ ACUTE PT VISIT: 1 Visit                     Katheryn Leap  PTA Acute  Rehabilitation Services Office M-F          [phone]

## 2024-06-25 NOTE — Progress Notes (Signed)
 Physical Therapy Treatment Patient Details Name: Sue Davidson MRN: [id number] DOB: 1950/12/11 Today's Date: 06/25/2024   History of Present Illness Sue Davidson is a 73 y.o. female admitted 06/18/24 with L flank pain, multiple falls, weakness.CT renal study showed left hydroureteronephrosis with 7 mm calculus in the proximal left ureter, S/P cystoscopy with L ureteral stent placed.  PMH: anemia,  anxiety, depression, bilateral carotid arterial disease, type 2 diabetes, class II obesity, GERD, grade 1 diastolic dysfunction,  pulmonary artery hypertension, hyperlipidemia,  sleep apnea,  history of urolithiasis presented to the ED with left flank pain.  In the ED patient was febrile tachycardic tachypneic, hypotensive and required supplemental oxygen.    PT Comments  Called to assist Pt back to bed by Nursing staff.  Used STEDY    If plan is discharge home, recommend the following: Two people to help with walking and/or transfers;A lot of help with bathing/dressing/bathroom   Can travel by private vehicle     No  Equipment Recommendations  None recommended by PT    Recommendations for Other Services       Precautions / Restrictions Precautions Precautions: Fall Precaution/Restrictions Comments: bad RIGHT hip Restrictions Weight Bearing Restrictions Per Provider Order: No     Mobility  Bed Mobility Overal bed mobility: Needs Assistance Bed Mobility: Sit to Supine     Supine to sit: Max assist, +2 for physical assistance Sit to supine: Total assist, +2 for physical assistance   General bed mobility comments: Required Total Assist back to bed full support B LE and position to comfort.    Transfers Overall transfer level: Needs assistance Equipment used: Bilateral platform walker Transfers: Sit to/from Stand             General transfer comment: from lower recliner level, Pt was unable to rise with Nursing staff.  Used a long bed sheet wrapped around Pt's waist and  STEDY to get Pt out of recliner and back to bed.  Required + 2 assist to rise pulling wrapped sheet tether style to assist with anterior weight shift and upright posture.  Stedy flaps placed down such that pt was seated as she was assistsed back to bed.  Pt was more able to rise from elevated STEDY flaps then assisted with lower hips onto EOB. Transfer via Lift Equipment: Stedy  Ambulation/Gait  Assisted back to bed only this session      Stairs             Wheelchair Mobility     Tilt Bed    Modified Rankin (Stroke Patients Only)       Balance                                            Communication Communication Communication: No apparent difficulties  Cognition Arousal: Alert Behavior During Therapy: WFL for tasks assessed/performed   PT - Cognitive impairments: No apparent impairments                       PT - Cognition Comments: AxO x 3 pleasant Lady who lives home alone.  Was able to amb in her Apartment with NO AD but did use a walker when out limited distances. Following commands: Intact      Cueing Cueing Techniques: Verbal cues  Exercises      General Comments  Pertinent Vitals/Pain Pain Assessment Pain Assessment: Faces Faces Pain Scale: Hurts little more Pain Location: right  hip. low back Pain Descriptors / Indicators: Discomfort Pain Intervention(s): Monitored during session, Repositioned    Home Living                          Prior Function            PT Goals (current goals can now be found in the care plan section) Progress towards PT goals: Progressing toward goals    Frequency    Min 2X/week      PT Plan      Co-evaluation              AM-PAC PT 6 Clicks Mobility   Outcome Measure  Help needed turning from your back to your side while in a flat bed without using bedrails?: A Lot Help needed moving from lying on your back to sitting on the side of a flat bed  without using bedrails?: A Lot Help needed moving to and from a bed to a chair (including a wheelchair)?: A Lot Help needed standing up from a chair using your arms (e.g., wheelchair or bedside chair)?: A Lot Help needed to walk in hospital room?: A Lot Help needed climbing 3-5 steps with a railing? : Total 6 Click Score: 11    End of Session Equipment Utilized During Treatment: Gait belt Activity Tolerance: Patient limited by fatigue Patient left: in bed;with call bell/phone within reach;with family/visitor present Nurse Communication: Mobility status PT Visit Diagnosis: Unsteadiness on feet (R26.81);Other abnormalities of gait and mobility (R26.89);Muscle weakness (generalized) (M62.81);History of falling (Z91.81);Pain Pain - Right/Left: Right Pain - part of body: Hip     Time: 8664-8650 PT Time Calculation (min) (ACUTE ONLY): 14 min  Charges:    $Gait Training: 8-22 mins $Therapeutic Activity: 8-22 mins PT General Charges $$ ACUTE PT VISIT: 1 Visit                    Katheryn Leap  PTA Acute  Rehabilitation Services Office M-F          [phone]

## 2024-06-25 NOTE — Progress Notes (Signed)
 Patient ID: Sue Davidson, female   DOB: [birth year], 73 y.o.   MRN: [id number]  7 Days Post-Op Subjective: No changes.  Objective: Vital signs in last 24 hours: Temp:  [98 F (36.7 C)-98.4 F (36.9 C)] 98.4 F (36.9 C) (08/18 0407) Pulse Rate:  [80-81] 80 (08/18 0407) Resp:  [20] 20 (08/17 2009) BP: (151-157)/(50-59) 157/50 (08/18 0407) SpO2:  [97 %] 97 % (08/18 0407) Weight:  [117.6 kg] 117.6 kg (08/18 0414)  Intake/Output from previous day: 08/17 0701 - 08/18 0700 In: 50 [IV Piggyback:50] Out: 2000 [Urine:2000] Intake/Output this shift: No intake/output data recorded.  Physical Exam:  General: Alert and oriented   Lab Results: Recent Labs    06/23/24 1159  HGB 11.5*  HCT 36.4   BMET Recent Labs    06/24/24 0604 06/25/24 0506  NA 134* 137  K 3.5 3.6  CL 104 106  CO2 16* 16*  GLUCOSE 89 86  BUN 77* 81*  CREATININE 6.67* 7.17*  CALCIUM  8.0* 8.0*     Studies/Results: No results found.  Assessment/Plan: 1) Left ureteral stone/sepsis: S/P left ureteral stent 8/11.  On cefazolin  for pansensitive E coli culture.  Continue appropriate antibiotic therapy for 10-14 days.  Will arrange outpatient followup and treatment once acute medical issues and infection resolved. 2) AKI: Renal function still worsening.  Renal ultrasound without hydronephrosis suggesting no ongoing obstructive component.   LOS: 7 days   Noretta Ferrara 06/25/2024, 4:03 PM

## 2024-06-26 DIAGNOSIS — A419 Sepsis, unspecified organism: Secondary | ICD-10-CM | POA: Diagnosis not present

## 2024-06-26 DIAGNOSIS — R6520 Severe sepsis without septic shock: Secondary | ICD-10-CM | POA: Diagnosis not present

## 2024-06-26 LAB — RENAL FUNCTION PANEL
Albumin: 2 g/dL — ABNORMAL LOW (ref 3.5–5.0)
Anion gap: 17 — ABNORMAL HIGH (ref 5–15)
BUN: 88 mg/dL — ABNORMAL HIGH (ref 8–23)
CO2: 16 mmol/L — ABNORMAL LOW (ref 22–32)
Calcium: 8.1 mg/dL — ABNORMAL LOW (ref 8.9–10.3)
Chloride: 105 mmol/L (ref 98–111)
Creatinine, Ser: 7.04 mg/dL — ABNORMAL HIGH (ref 0.44–1.00)
GFR, Estimated: 6 mL/min — ABNORMAL LOW (ref >60)
Glucose, Bld: 110 mg/dL — ABNORMAL HIGH (ref 70–99)
Phosphorus: 6.6 mg/dL — ABNORMAL HIGH (ref 2.5–4.6)
Potassium: 3.6 mmol/L (ref 3.5–5.1)
Sodium: 138 mmol/L (ref 135–145)

## 2024-06-26 LAB — GLUCOSE, CAPILLARY
Glucose-Capillary: 102 mg/dL — ABNORMAL HIGH (ref 70–99)
Glucose-Capillary: 105 mg/dL — ABNORMAL HIGH (ref 70–99)
Glucose-Capillary: 194 mg/dL — ABNORMAL HIGH (ref 70–99)
Glucose-Capillary: 156 mg/dL — ABNORMAL HIGH (ref 70–99)

## 2024-06-26 NOTE — Progress Notes (Signed)
 [facility] Progress Note  Subjective:  UOP 1.6 L yest, 4.7 L today so far Creat down 7.0  Vitals:   06/25/24 0414 06/25/24 2034 06/26/24 0500 06/26/24 0539  BP:  (!) 150/71  (!) 148/51  Pulse:  82  79  Resp:  16  16  Temp:  (!) 97.5 F (36.4 C)  97.8 F (36.6 C)  TempSrc:      SpO2:  99%  97%  Weight: 117.6 kg  114.9 kg   Height:        Exam: Gen: NAD CVS: RRR Resp:CTA Abd: +BS, soft, NT/ND Ext: diffuse 1-2+ bilat LE edema  Home bp meds:  Norvasc  2.5 every day Furosemide  80mg  every day Olmesartan  40mg  every day Others: asa, zoloft , mounjaro, crestor , PPI, T4, insulin , wellbutrin  and flomax     Assessment/ Plan: AKI - multifactorial with ischemic ATN in setting of urosepsis, volume depletion, and concomitant ARB, as well as left sided hydronephrosis.  Despite stent placement, creat continued to climb, however her blood pressure was also low and required stopping several agents. F/u renal US  on 8/14 showed no hydronephrosis post stent placement on 8/11.  Continuing to hold ARB/ lasix . Creatinine down slightly today on trial of IV lasix  for vol overload. No uremic findings, no RRT needed yet. Cont IV lasix .  E. Coli urosepsis - getting IV ancef  Urolithiasis with hydronephrosis of left kidney - s/p cystoscopy and left ureteral stent placement on 06/18/24.  F/u US  8/14 was neg for obstruction.  Volume overload: remains edematous in the LE's, UOP improved.  HTN - bp's were low and home meds are on hold. Current bp's 140/70 range, stable.      Myer Fret MD  [facility] 06/26/2024, 11:40 AM  Recent Labs  Lab 06/20/24 0305 06/21/24 0512 06/22/24 0457 06/23/24 1159 06/23/24 1809 06/25/24 1827 06/26/24 0604  HGB 11.2* 10.2*  --  11.5*  --   --   --   ALBUMIN  --   --   --   --   --   --  2.0*  CALCIUM  7.7* 7.7*   < >  --    < > 8.0* 8.1*  PHOS 4.8*  --   --   --   --   --  6.6*  CREATININE 4.62* 5.24*   < >  --    < > 7.33* 7.04*  K 4.1 3.1*   < >  --    < >  3.9 3.6   < > = values in this interval not displayed.   No results for input(s): IRON, TIBC, FERRITIN in the last 168 hours. Inpatient medications:  aspirin  EC  81 mg Oral Daily   buPROPion   150 mg Oral q morning   cephALEXin   500 mg Oral Q24H   Chlorhexidine  Gluconate Cloth  6 each Topical Daily   furosemide   80 mg Intravenous Q8H   gabapentin   100 mg Oral QHS   insulin  aspart  0-20 Units Subcutaneous TID WC   insulin  aspart  0-5 Units Subcutaneous QHS   levothyroxine   75 mcg Oral Q0600   pantoprazole   40 mg Oral Daily   rosuvastatin   10 mg Oral Q breakfast   sertraline   100 mg Oral Daily   tamsulosin   0.4 mg Oral QPC supper     HYDROmorphone  (DILAUDID ) injection, mouth rinse, oxyCODONE , polyethylene glycol, prochlorperazine 

## 2024-06-26 NOTE — Progress Notes (Addendum)
 PROGRESS NOTE  Sue Davidson  FMW:[id number] DOB: 01/24/1951 DOA: 06/18/2024 PCP: Clarice Nottingham, MD   Brief Narrative: Patient is 73 y.o. female with medical history significant of anemia,  anxiety, depression, bilateral carotid arterial disease, type 2 diabetes, class II obesity, GERD, grade 1 diastolic dysfunction,  pulmonary artery hypertension, hyperlipidemia,  sleep apnea,  history of urolithiasis presented to the ED with left flank pain.  In the ED patient was febrile tachycardic tachypneic, hypotensive and required supplemental oxygen.  Urinalysis showed abnormal urinalysis with more than 50 white cells.  COVID influenza and RSV was negative.  CBC showed elevated WBC at 14.8 with 85% neutrophils.  Lactic acid was initially elevated at 5.9 followed by 4.6.  CMP showed hyponatremia with sodium of 130 and creatinine elevation at 3.6 from baseline 0.8.  AST and ALT mildly elevated.  CT renal study showed left hydroureteronephrosis with 7 mm calculus in the proximal left ureter.  She had multiple falls at home after having generalized weakness.    Sepsis protocol ordered and urology was consulted.  Patient was given IV Rocephin  and vasopressors initially.  Patient was then seen by urology and underwent cystoscopy with left ureteral stent placement on 8/11.  Hospital course remarkable for worsening renal function.  Nephrology consulted.  Slight improvement in the kidney function today  Assessment & Plan:  Principal Problem:   Severe sepsis (HCC) Active Problems:   Acute kidney injury (nontraumatic) (HCC)   Acute unilateral obstructive uropathy   Essential hypertension   Thrombocytopenia (HCC)   Hyponatremia   Obesity, Class III, BMI 40-49.9 (morbid obesity)   Dyslipidemia   Gastroesophageal reflux disease   OSA (obstructive sleep apnea)   Hypothyroidism   Acute UTI (urinary tract infection)   Urolithiasis   Hydronephrosis of left kidney   Depression   Uncontrolled type 2 diabetes  mellitus with hyperglycemia, with long-term current use of insulin  (HCC)   E coli bacteremia   Severe sepsis secondary to E. coli bacteremia:Presented with fever, leukocytosis.  Blood cultures/urine cultures showing pansensitive E. coli.  Antibiotics changed to oral   AKI; Baseline creatinine normal.  AKI secondary to sepsis, hypotension, she was also on ARB at home.  Creatinine in the range of 7.  Creatinine slightly trended down today. nephrology closely following.  Being given IV diuretics today because of volume excess/LE edema.  No plan for dialysis.  No signs of uremia.  Having excellent diuresis  Acute unilateral obstructive uropathy: Status post stent placement in the left ureter on 8/11.  Had left ureteral calculus  Hypertension: Currently blood pressure stable.  She was hypertensive on presentation.  Antihypertensives on hold  Thrombocytopenia: Likely from sepsis, resolved  Obesity: BMI of 41.7  Hyperlipidemia: Takes Crestor  at home  GERD: Continue PPI  Hypothyroidism: Continue Synthyroid  OSA: Not on CPAP  Uncontrolled diabetes type 2: Currently on sliding scale.  Depression: oN Wellbutrin   Debility/deconditioning: PT recommending SNF on discharge.  TOC consulted          DVT prophylaxis:SCDs Start: 06/18/24 9375     Code Status: Full Code  Family Communication: Brother at bedside on 8/19  Patient status:Inpatient  Patient is from :home  Anticipated discharge un:ynfz  Estimated DC date: After improvement in the renal function, nephrology clearance   Consultants: Urology, nephrology  Procedures: Stent placement  Antimicrobials:  Anti-infectives (From admission, onward)    Start     Dose/Rate Route Frequency Ordered Stop   06/25/24 2200  cephALEXin  (KEFLEX ) capsule 500 mg  500 mg Oral Every 24 hours 06/25/24 1317 06/28/24 2159   06/24/24 2200  ceFAZolin  (ANCEF ) IVPB 1 g/50 mL premix  Status:  Discontinued        1 g 100 mL/hr over 30  Minutes Intravenous Every 24 hours 06/24/24 0737 06/25/24 1317   06/23/24 1000  ceFAZolin  (ANCEF ) IVPB 1 g/50 mL premix  Status:  Discontinued        1 g 100 mL/hr over 30 Minutes Intravenous Every 12 hours 06/23/24 0855 06/24/24 0737   06/20/24 1030  ceFAZolin  (ANCEF ) IVPB 2g/100 mL premix  Status:  Discontinued        2 g 200 mL/hr over 30 Minutes Intravenous Every 12 hours 06/20/24 0943 06/23/24 0855   06/19/24 0200  cefTRIAXone  (ROCEPHIN ) 2 g in sodium chloride  0.9 % 100 mL IVPB  Status:  Discontinued        2 g 200 mL/hr over 30 Minutes Intravenous Every 24 hours 06/18/24 0622 06/20/24 0943   06/18/24 0215  cefTRIAXone  (ROCEPHIN ) 2 g in sodium chloride  0.9 % 100 mL IVPB        2 g 200 mL/hr over 30 Minutes Intravenous Once 06/18/24 0205 06/18/24 0258       Subjective: Patient seen and examined at the bedside today.  Remains comfortable.  Lying in bed.  No new complaints.  Had excellent diuresis.  Lower extremity edema slightly better today.  Objective: Vitals:   06/25/24 2034 06/26/24 0500 06/26/24 0539 06/26/24 1207  BP: (!) 150/71  (!) 148/51 (!) 150/51  Pulse: 82  79 74  Resp: 16  16 16   Temp: (!) 97.5 F (36.4 C)  97.8 F (36.6 C) (!) 97.4 F (36.3 C)  TempSrc:      SpO2: 99%  97% 100%  Weight:  114.9 kg    Height:        Intake/Output Summary (Last 24 hours) at 06/26/2024 1238 Last data filed at 06/26/2024 1040 Gross per 24 hour  Intake 360 ml  Output 7950 ml  Net -7590 ml   Filed Weights   06/23/24 1700 06/25/24 0414 06/26/24 0500  Weight: 117.4 kg 117.6 kg 114.9 kg    Examination:    General exam: Overall comfortable, not in distress, obese HEENT: PERRL Respiratory system:  no wheezes or crackles  Cardiovascular system: S1 & S2 heard, RRR.  Gastrointestinal system: Abdomen is nondistended, soft and nontender. Central nervous system: Alert and oriented Extremities:Bilateral lower extremity pitting edema, no clubbing ,no cyanosis Skin: No rashes, no  ulcers,no icterus      Data Reviewed: I have personally reviewed following labs and imaging studies  CBC: Recent Labs  Lab 06/20/24 0305 06/21/24 0512 06/23/24 1159  WBC 13.9* 10.2 12.1*  NEUTROABS  --   --  8.7*  HGB 11.2* 10.2* 11.5*  HCT 35.1* 32.0* 36.4  MCV 93.6 92.0 92.9  PLT 117* 128* 193   Basic Metabolic Panel: Recent Labs  Lab 06/20/24 0305 06/21/24 0512 06/23/24 1809 06/24/24 0604 06/25/24 0506 06/25/24 1827 06/26/24 0604  NA 130*   < > 134* 134* 137 136 138  K 4.1   < > 3.5 3.5 3.6 3.9 3.6  CL 96*   < > 102 104 106 104 105  CO2 20*   < > 17* 16* 16* 17* 16*  GLUCOSE 225*   < > 133* 89 86 96 110*  BUN 70*   < > 75* 77* 81* 84* 88*  CREATININE 4.62*   < > 6.40* 6.67* 7.17*  7.33* 7.04*  CALCIUM  7.7*   < > 8.2* 8.0* 8.0* 8.0* 8.1*  MG 2.1  --   --   --   --   --   --   PHOS 4.8*  --   --   --   --   --  6.6*   < > = values in this interval not displayed.     Recent Results (from the past 240 hours)  Blood Culture (routine x 2)     Status: Abnormal   Collection Time: 06/18/24  2:00 AM   Specimen: BLOOD  Result Value Ref Range Status   Specimen Description   Final    BLOOD RIGHT ANTECUBITAL Performed at [facility], [address] [place], [place] [address]    Special Requests   Final    BOTTLES DRAWN AEROBIC AND ANAEROBIC Blood Culture adequate volume Performed at [facility], [address] [place], [place] [address]    Culture  Setup Time   Final    GRAM NEGATIVE RODS IN BOTH AEROBIC AND ANAEROBIC BOTTLES CRITICAL VALUE NOTED.  VALUE IS CONSISTENT WITH PREVIOUSLY REPORTED AND CALLED VALUE.    Culture (A)  Final    ESCHERICHIA COLI SUSCEPTIBILITIES PERFORMED ON PREVIOUS CULTURE WITHIN THE LAST 5 DAYS. Performed at [facility], [address], [place], [place] [address]    Report Status 06/21/2024 FINAL  Final  Blood Culture (routine x 2)     Status: Abnormal   Collection Time: 06/18/24  2:10 AM   Specimen:  BLOOD LEFT WRIST  Result Value Ref Range Status   Specimen Description   Final    BLOOD LEFT WRIST Performed at [facility], [address] [place], [place] [address]    Special Requests   Final    BOTTLES DRAWN AEROBIC AND ANAEROBIC Blood Culture adequate volume Performed at [facility], [address] [place], [place] [address]    Culture  Setup Time   Final    GRAM NEGATIVE RODS IN BOTH AEROBIC AND ANAEROBIC BOTTLES CRITICAL RESULT CALLED TO, READ BACK BY AND VERIFIED WITH: PHARMD L POINTDEXTER 06/19/2024 @ 0022 BY AB Performed at [facility], [address], [place], [place] [address]    Culture ESCHERICHIA COLI (A)  Final   Report Status 06/20/2024 FINAL  Final   Organism ID, Bacteria ESCHERICHIA COLI  Final      Susceptibility   Escherichia coli - MIC*    AMPICILLIN 4 SENSITIVE Sensitive     CEFAZOLIN  Value in next row Sensitive      <=1 SENSITIVEThis is a modified FDA-approved test that has been validated and its performance characteristics determined by the reporting laboratory.  This laboratory is certified under the Clinical Laboratory Improvement Amendments CLIA as qualified to perform high complexity clinical laboratory testing.    CEFEPIME Value in next row Sensitive      <=1 SENSITIVEThis is a modified FDA-approved test that has been validated and its performance characteristics determined by the reporting laboratory.  This laboratory is certified under the Clinical Laboratory Improvement Amendments CLIA as qualified to perform high complexity clinical laboratory testing.    ERTAPENEM Value in next row Sensitive      <=1 SENSITIVEThis is a modified FDA-approved test that has been validated and its performance characteristics determined by the reporting laboratory.  This laboratory is certified under the Clinical Laboratory Improvement Amendments CLIA as qualified to perform high  complexity clinical laboratory testing.    CEFTRIAXONE  Value in  next row Sensitive      <=1 SENSITIVEThis is a modified FDA-approved test that has been validated and its performance characteristics determined by the reporting laboratory.  This laboratory is certified under the Clinical Laboratory Improvement Amendments CLIA as qualified to perform high complexity clinical laboratory testing.    CIPROFLOXACIN Value in next row Sensitive      <=1 SENSITIVEThis is a modified FDA-approved test that has been validated and its performance characteristics determined by the reporting laboratory.  This laboratory is certified under the Clinical Laboratory Improvement Amendments CLIA as qualified to perform high complexity clinical laboratory testing.    GENTAMICIN Value in next row Sensitive      <=1 SENSITIVEThis is a modified FDA-approved test that has been validated and its performance characteristics determined by the reporting laboratory.  This laboratory is certified under the Clinical Laboratory Improvement Amendments CLIA as qualified to perform high complexity clinical laboratory testing.    MEROPENEM Value in next row Sensitive      <=1 SENSITIVEThis is a modified FDA-approved test that has been validated and its performance characteristics determined by the reporting laboratory.  This laboratory is certified under the Clinical Laboratory Improvement Amendments CLIA as qualified to perform high complexity clinical laboratory testing.    TRIMETH/SULFA Value in next row Sensitive      <=1 SENSITIVEThis is a modified FDA-approved test that has been validated and its performance characteristics determined by the reporting laboratory.  This laboratory is certified under the Clinical Laboratory Improvement Amendments CLIA as qualified to perform high complexity clinical laboratory testing.    AMPICILLIN/SULBACTAM Value in next row Sensitive      <=1 SENSITIVEThis is a modified FDA-approved test that has been validated and its performance characteristics determined by the  reporting laboratory.  This laboratory is certified under the Clinical Laboratory Improvement Amendments CLIA as qualified to perform high complexity clinical laboratory testing.    PIP/TAZO Value in next row Sensitive ug/mL     <=4 SENSITIVEThis is a modified FDA-approved test that has been validated and its performance characteristics determined by the reporting laboratory.  This laboratory is certified under the Clinical Laboratory Improvement Amendments CLIA as qualified to perform high complexity clinical laboratory testing.    * ESCHERICHIA COLI  Blood Culture ID Panel (Reflexed)     Status: Abnormal   Collection Time: 06/18/24  2:10 AM  Result Value Ref Range Status   Enterococcus faecalis NOT DETECTED NOT DETECTED Final   Enterococcus Faecium NOT DETECTED NOT DETECTED Final   Listeria monocytogenes NOT DETECTED NOT DETECTED Final   Staphylococcus species NOT DETECTED NOT DETECTED Final   Staphylococcus aureus (BCID) NOT DETECTED NOT DETECTED Final   Staphylococcus epidermidis NOT DETECTED NOT DETECTED Final   Staphylococcus lugdunensis NOT DETECTED NOT DETECTED Final   Streptococcus species NOT DETECTED NOT DETECTED Final   Streptococcus agalactiae NOT DETECTED NOT DETECTED Final   Streptococcus pneumoniae NOT DETECTED NOT DETECTED Final   Streptococcus pyogenes NOT DETECTED NOT DETECTED Final   A.calcoaceticus-baumannii NOT DETECTED NOT DETECTED Final   Bacteroides fragilis NOT DETECTED NOT DETECTED Final   Enterobacterales DETECTED (A) NOT DETECTED Final    Comment: Enterobacterales represent a large order of gram negative bacteria, not a single organism. CRITICAL RESULT CALLED TO, READ BACK BY AND VERIFIED WITH: PHARMD L POINTDEXTER 06/19/2024 @ 0022 BY AB    Enterobacter cloacae complex NOT DETECTED NOT DETECTED Final   Escherichia coli DETECTED (A)  NOT DETECTED Final    Comment: CRITICAL RESULT CALLED TO, READ BACK BY AND VERIFIED WITH: PHARMD L POINTDEXTER 06/19/2024 @ 0022 BY  AB    Klebsiella aerogenes NOT DETECTED NOT DETECTED Final   Klebsiella oxytoca NOT DETECTED NOT DETECTED Final   Klebsiella pneumoniae NOT DETECTED NOT DETECTED Final   Proteus species NOT DETECTED NOT DETECTED Final   Salmonella species NOT DETECTED NOT DETECTED Final   Serratia marcescens NOT DETECTED NOT DETECTED Final   Haemophilus influenzae NOT DETECTED NOT DETECTED Final   Neisseria meningitidis NOT DETECTED NOT DETECTED Final   Pseudomonas aeruginosa NOT DETECTED NOT DETECTED Final   Stenotrophomonas maltophilia NOT DETECTED NOT DETECTED Final   Candida albicans NOT DETECTED NOT DETECTED Final   Candida auris NOT DETECTED NOT DETECTED Final   Candida glabrata NOT DETECTED NOT DETECTED Final   Candida krusei NOT DETECTED NOT DETECTED Final   Candida parapsilosis NOT DETECTED NOT DETECTED Final   Candida tropicalis NOT DETECTED NOT DETECTED Final   Cryptococcus neoformans/gattii NOT DETECTED NOT DETECTED Final   CTX-M ESBL NOT DETECTED NOT DETECTED Final   Carbapenem resistance IMP NOT DETECTED NOT DETECTED Final   Carbapenem resistance KPC NOT DETECTED NOT DETECTED Final   Carbapenem resistance NDM NOT DETECTED NOT DETECTED Final   Carbapenem resist OXA 48 LIKE NOT DETECTED NOT DETECTED Final   Carbapenem resistance VIM NOT DETECTED NOT DETECTED Final    Comment: Performed at [facility], [address], [place], [place] [address]  Resp panel by RT-PCR (RSV, Flu A&B, Covid) Anterior Nasal Swab     Status: None   Collection Time: 06/18/24  3:11 AM   Specimen: Anterior Nasal Swab  Result Value Ref Range Status   SARS Coronavirus 2 by RT PCR NEGATIVE NEGATIVE Final    Comment: (NOTE) SARS-CoV-2 target nucleic acids are NOT DETECTED.  The SARS-CoV-2 RNA is generally detectable in upper respiratory specimens during the acute phase of infection. The lowest concentration of SARS-CoV-2 viral copies this assay can detect is 138 copies/mL. A negative result does not preclude  SARS-Cov-2 infection and should not be used as the sole basis for treatment or other patient management decisions. A negative result may occur with  improper specimen collection/handling, submission of specimen other than nasopharyngeal swab, presence of viral mutation(s) within the areas targeted by this assay, and inadequate number of viral copies(<138 copies/mL). A negative result must be combined with clinical observations, patient history, and epidemiological information. The expected result is Negative.  Fact Sheet for Patients:  BloggerCourse.com  Fact Sheet for Healthcare Providers:  SeriousBroker.it  This test is no t yet approved or cleared by the [place]  FDA and  has been authorized for detection and/or diagnosis of SARS-CoV-2 by FDA under an Emergency Use Authorization (EUA). This EUA will remain  in effect (meaning this test can be used) for the duration of the COVID-19 declaration under Section 564(b)(1) of the Act, 21 U.S.C.section 360bbb-3(b)(1), unless the authorization is terminated  or revoked sooner.       Influenza A by PCR NEGATIVE NEGATIVE Final   Influenza B by PCR NEGATIVE NEGATIVE Final    Comment: (NOTE) The Xpert Xpress SARS-CoV-2/FLU/RSV plus assay is intended as an aid in the diagnosis of influenza from Nasopharyngeal swab specimens and should not be used as a sole basis for treatment. Nasal washings and aspirates are unacceptable for Xpert Xpress SARS-CoV-2/FLU/RSV testing.  Fact Sheet for Patients: BloggerCourse.com  Fact Sheet for Healthcare Providers: SeriousBroker.it  This  test is not yet approved or cleared by the [place]  FDA and has been authorized for detection and/or diagnosis of SARS-CoV-2 by FDA under an Emergency Use Authorization (EUA). This EUA will remain in effect (meaning this test can be used) for the duration of  the COVID-19 declaration under Section 564(b)(1) of the Act, 21 U.S.C. section 360bbb-3(b)(1), unless the authorization is terminated or revoked.     Resp Syncytial Virus by PCR NEGATIVE NEGATIVE Final    Comment: (NOTE) Fact Sheet for Patients: BloggerCourse.com  Fact Sheet for Healthcare Providers: SeriousBroker.it  This test is not yet approved or cleared by the [place]  FDA and has been authorized for detection and/or diagnosis of SARS-CoV-2 by FDA under an Emergency Use Authorization (EUA). This EUA will remain in effect (meaning this test can be used) for the duration of the COVID-19 declaration under Section 564(b)(1) of the Act, 21 U.S.C. section 360bbb-3(b)(1), unless the authorization is terminated or revoked.  Performed at [facility], [address] [place], [place] [address]   Urine Culture     Status: Abnormal   Collection Time: 06/18/24  7:24 AM   Specimen: Urine, Cystoscope  Result Value Ref Range Status   Specimen Description   Final    CYSTOSCOPY URINE CYSTOSCOPE, PT ON CEFTRIAXONE  Performed at [facility], [address] [place], [place] [address]    Special Requests   Final    NONE Performed at [facility], [address], [place], [place] [address]    Culture 20,000 COLONIES/mL ESCHERICHIA COLI (A)  Final   Report Status 06/20/2024 FINAL  Final   Organism ID, Bacteria ESCHERICHIA COLI (A)  Final      Susceptibility   Escherichia coli - MIC*    AMPICILLIN 4 SENSITIVE Sensitive     CEFAZOLIN  Value in next row Sensitive      <=1 SENSITIVEThis is a modified FDA-approved test that has been validated and its performance characteristics determined by the reporting laboratory.  This laboratory is certified under the Clinical Laboratory Improvement Amendments CLIA as qualified to perform high complexity clinical laboratory testing.    CEFEPIME Value in  next row Sensitive      <=1 SENSITIVEThis is a modified FDA-approved test that has been validated and its performance characteristics determined by the reporting laboratory.  This laboratory is certified under the Clinical Laboratory Improvement Amendments CLIA as qualified to perform high complexity clinical laboratory testing.    ERTAPENEM Value in next row Sensitive      <=1 SENSITIVEThis is a modified FDA-approved test that has been validated and its performance characteristics determined by the reporting laboratory.  This laboratory is certified under the Clinical Laboratory Improvement Amendments CLIA as qualified to perform high complexity clinical laboratory testing.    CEFTRIAXONE  Value in next row Sensitive      <=1 SENSITIVEThis is a modified FDA-approved test that has been validated and its performance characteristics determined by the reporting laboratory.  This laboratory is certified under the Clinical Laboratory Improvement Amendments CLIA as qualified to perform high complexity clinical laboratory testing.    CIPROFLOXACIN Value in next row Sensitive      <=1 SENSITIVEThis is a modified FDA-approved test that has been validated and its performance characteristics determined by the reporting laboratory.  This laboratory is certified under the Clinical Laboratory Improvement Amendments CLIA as qualified to perform high complexity clinical laboratory testing.    GENTAMICIN Value in next row Sensitive      <=  1 SENSITIVEThis is a modified FDA-approved test that has been validated and its performance characteristics determined by the reporting laboratory.  This laboratory is certified under the Clinical Laboratory Improvement Amendments CLIA as qualified to perform high complexity clinical laboratory testing.    MEROPENEM Value in next row Sensitive      <=1 SENSITIVEThis is a modified FDA-approved test that has been validated and its performance characteristics determined by the reporting  laboratory.  This laboratory is certified under the Clinical Laboratory Improvement Amendments CLIA as qualified to perform high complexity clinical laboratory testing.    TRIMETH/SULFA Value in next row Sensitive      <=1 SENSITIVEThis is a modified FDA-approved test that has been validated and its performance characteristics determined by the reporting laboratory.  This laboratory is certified under the Clinical Laboratory Improvement Amendments CLIA as qualified to perform high complexity clinical laboratory testing.    AMPICILLIN/SULBACTAM Value in next row Sensitive      <=1 SENSITIVEThis is a modified FDA-approved test that has been validated and its performance characteristics determined by the reporting laboratory.  This laboratory is certified under the Clinical Laboratory Improvement Amendments CLIA as qualified to perform high complexity clinical laboratory testing.    PIP/TAZO Value in next row Sensitive ug/mL     <=4 SENSITIVEThis is a modified FDA-approved test that has been validated and its performance characteristics determined by the reporting laboratory.  This laboratory is certified under the Clinical Laboratory Improvement Amendments CLIA as qualified to perform high complexity clinical laboratory testing.    * 20,000 COLONIES/mL ESCHERICHIA COLI  MRSA Next Gen by PCR, Nasal     Status: None   Collection Time: 06/18/24 10:53 AM   Specimen: Nasal Mucosa; Nasal Swab  Result Value Ref Range Status   MRSA by PCR Next Gen NOT DETECTED NOT DETECTED Final    Comment: (NOTE) The GeneXpert MRSA Assay (FDA approved for NASAL specimens only), is one component of a comprehensive MRSA colonization surveillance program. It is not intended to diagnose MRSA infection nor to guide or monitor treatment for MRSA infections. Test performance is not FDA approved in patients less than 58 years old. Performed at [facility], [address] [place], [place] [address]       Radiology Studies: No results found.  Scheduled Meds:  aspirin  EC  81 mg Oral Daily   buPROPion   150 mg Oral q morning   cephALEXin   500 mg Oral Q24H   Chlorhexidine  Gluconate Cloth  6 each Topical Daily   furosemide   80 mg Intravenous Q8H   gabapentin   100 mg Oral QHS   insulin  aspart  0-20 Units Subcutaneous TID WC   insulin  aspart  0-5 Units Subcutaneous QHS   levothyroxine   75 mcg Oral Q0600   pantoprazole   40 mg Oral Daily   rosuvastatin   10 mg Oral Q breakfast   sertraline   100 mg Oral Daily   tamsulosin   0.4 mg Oral QPC supper   Continuous Infusions:     LOS: 8 days   Ivonne Mustache, MD [facility] P8/19/2025, 12:38 PM

## 2024-06-26 NOTE — TOC Progression Note (Addendum)
 Transition of Care [facility]) - Progression Note    Patient Details  Name: Sue Davidson MRN: [id number] Date of Birth: Mar 05, 1951  Transition of Care [facility]) CM/SW Contact  Heather DELENA Saltness, LCSW Phone Number: 06/26/2024, 10:40 AM  Clinical Narrative:     ADDENDUM  10:49 AM - Pt has been granted PASRR, [id number]  CSW submitted FL2 and 30-day PASRR note to [place] MUST. PASRR currently under manual review. TOC will continue to follow.   Expected Discharge Plan: Home/Self Care Barriers to Discharge: Continued Medical Work up   Expected Discharge Plan and Services   Discharge Planning Services: CM Consult   Living arrangements for the past 2 months: Apartment                  Social Drivers of Health (SDOH) Interventions SDOH Screenings   Food Insecurity: Food Insecurity Present (06/21/2024)  Housing: Low Risk  (06/21/2024)  Transportation Needs: No Transportation Needs (06/21/2024)  Utilities: Not At Risk (06/21/2024)  Social Connections: Moderately Integrated (06/18/2024)  Tobacco Use: Low Risk  (06/18/2024)    Readmission Risk Interventions    06/21/2024    5:03 PM  Readmission Risk Prevention Plan  Transportation Screening Complete  PCP or Specialist Appt within 3-5 Days Complete  HRI or Home Care Consult Complete  Social Work Consult for Recovery Care Planning/Counseling Complete  Palliative Care Screening Not Applicable  Medication Review Oceanographer) Complete    Signed: Heather Saltness, MSW, LCSW Clinical Social Worker Inpatient Care Management 06/26/2024 10:42 AM

## 2024-06-26 NOTE — Plan of Care (Signed)

## 2024-06-26 NOTE — Plan of Care (Signed)
   Problem: Elimination: Goal: Will not experience complications related to bowel motility Outcome: Progressing Goal: Will not experience complications related to urinary retention Outcome: Progressing   Problem: Safety: Goal: Ability to remain free from injury will improve Outcome: Progressing

## 2024-06-26 NOTE — TOC Progression Note (Addendum)
 Transition of Care [facility]) - Progression Note    Patient Details  Name: Sue Davidson MRN: [id number] Date of Birth: 01-04-1951  Transition of Care [facility]) CM/SW Contact  Heather DELENA Saltness, LCSW Phone Number: 06/26/2024, 11:06 AM  Clinical Narrative:    CSW met with pt at bedside to discuss SNF bed availability and obtain pt's facility choice. CSW provided pt with list of facilities including name of facility, location, and Medicare Star-Ratings. Pt chooses bed at [facility] CSW made [place] [facility] aware of pt's acceptance. Nikki reports bed will be available tomorrow. Insurance authorization requested, currently pending.   Medicare Star-Rating  [facility] [address] [place], [place] [address] [phone] Overall rating ?? Below average  [facility] [address] [place], [place] [address] [phone] Overall rating ?? Below average  [facility] [address] [place], [place] [address] [phone] Overall rating ? Much above average  [facility] [address] [place], [place] [address] [phone] Overall rating ? Much below average   Expected Discharge Plan: Home/Self Care Barriers to Discharge: Continued Medical Work up    Expected Discharge Plan and Services   Discharge Planning Services: CM Consult   Living arrangements for the past 2 months: Apartment                     Social Drivers of Health (SDOH) Interventions SDOH Screenings   Food Insecurity: Food Insecurity Present (06/21/2024)  Housing: Low Risk  (06/21/2024)  Transportation Needs: No Transportation Needs (06/21/2024)  Utilities: Not At Risk (06/21/2024)  Social Connections: Moderately Integrated (06/18/2024)  Tobacco Use: Low Risk  (06/18/2024)    Readmission Risk Interventions    06/21/2024    5:03 PM  Readmission Risk Prevention Plan  Transportation Screening  Complete  PCP or Specialist Appt within 3-5 Days Complete  HRI or Home Care Consult Complete  Social Work Consult for Recovery Care Planning/Counseling Complete  Palliative Care Screening Not Applicable  Medication Review Oceanographer) Complete

## 2024-06-27 DIAGNOSIS — R6520 Severe sepsis without septic shock: Secondary | ICD-10-CM | POA: Diagnosis not present

## 2024-06-27 DIAGNOSIS — A419 Sepsis, unspecified organism: Secondary | ICD-10-CM | POA: Diagnosis not present

## 2024-06-27 LAB — RENAL FUNCTION PANEL
Albumin: 2.1 g/dL — ABNORMAL LOW (ref 3.5–5.0)
Anion gap: 16 — ABNORMAL HIGH (ref 5–15)
BUN: 80 mg/dL — ABNORMAL HIGH (ref 8–23)
CO2: 17 mmol/L — ABNORMAL LOW (ref 22–32)
Calcium: 7.9 mg/dL — ABNORMAL LOW (ref 8.9–10.3)
Chloride: 104 mmol/L (ref 98–111)
Creatinine, Ser: 6.3 mg/dL — ABNORMAL HIGH (ref 0.44–1.00)
GFR, Estimated: 6 mL/min — ABNORMAL LOW (ref >60)
Glucose, Bld: 136 mg/dL — ABNORMAL HIGH (ref 70–99)
Phosphorus: 5.9 mg/dL — ABNORMAL HIGH (ref 2.5–4.6)
Potassium: 3.4 mmol/L — ABNORMAL LOW (ref 3.5–5.1)
Sodium: 137 mmol/L (ref 135–145)

## 2024-06-27 LAB — GLUCOSE, CAPILLARY
Glucose-Capillary: 133 mg/dL — ABNORMAL HIGH (ref 70–99)
Glucose-Capillary: 127 mg/dL — ABNORMAL HIGH (ref 70–99)
Glucose-Capillary: 168 mg/dL — ABNORMAL HIGH (ref 70–99)
Glucose-Capillary: 161 mg/dL — ABNORMAL HIGH (ref 70–99)

## 2024-06-27 MED ORDER — FUROSEMIDE 10 MG/ML IJ SOLN
80.0000 mg | Freq: Two times a day (BID) | INTRAMUSCULAR | Status: DC
  Administered 2024-06-27 – 2024-06-28 (×2): 80 mg via INTRAVENOUS
  Filled 2024-06-27 (×2): qty 8

## 2024-06-27 MED ORDER — AMLODIPINE BESYLATE 5 MG PO TABS
5.0000 mg | ORAL_TABLET | Freq: Every day | ORAL | Status: DC
  Administered 2024-06-27 – 2024-06-28 (×2): 5 mg via ORAL
  Filled 2024-06-27 (×2): qty 1

## 2024-06-27 MED ORDER — POTASSIUM CHLORIDE CRYS ER 20 MEQ PO TBCR
20.0000 meq | EXTENDED_RELEASE_TABLET | Freq: Once | ORAL | Status: AC
  Administered 2024-06-27: 20 meq via ORAL
  Filled 2024-06-27: qty 1

## 2024-06-27 NOTE — Progress Notes (Signed)
 [facility] Progress Note  Subjective:  UOP 8.9 L yest and 3.2 L today so far Creat down to 6.7 Pt stable, no new c/o's   Vitals:   06/27/24 0347 06/27/24 0500 06/27/24 0617 06/27/24 1120  BP: (!) 165/56  (!) 151/55 (!) 154/64  Pulse: 74  71 72  Resp: 19  18   Temp: 98 F (36.7 C)   97.9 F (36.6 C)  TempSrc: Oral   Oral  SpO2: 96%  97% 97%  Weight:  106.4 kg    Height:        Exam: Gen: NAD CVS: RRR Resp:CTA Abd: +BS, soft, NT/ND Ext: diffuse 1+ bilat LE edema, improving  Home bp meds:  Norvasc  2.5 every day Furosemide  80mg  every day Olmesartan  40mg  every day Others: asa, zoloft , mounjaro, crestor , PPI, T4, insulin , wellbutrin  and flomax     Assessment/ Plan: AKI - multifactorial with ischemic ATN in setting of urosepsis, volume depletion, and concomitant ARB, as well as left sided hydronephrosis.  Despite stent placement, creat continued to climb, however her blood pressure was also low and required stopping several agents. F/u renal US  on 8/14 showed no hydronephrosis post stent placement on 8/11.  Eventually pt required diuresis for vol overload (decomp CHF most likely) causing further worsening of renal function. Creatinine is coming down w/ diuresis, creat 6.3 today (7.0 yesterday). Lowering IV lasix  to 80 bid, cont diuresis.  E. Coli urosepsis - getting IV ancef  Urolithiasis with hydronephrosis of left kidney - s/p cystoscopy and left ureteral stent placement on 06/18/24.  F/u US  8/14 was neg for obstruction.  Volume overload: remains edematous in the LE's, UOP improved.  HTN - bp's were low and home meds are on hold. Current bp's 140/70 range, stable. Dispo: they are looking for SNF now      Myer Fret MD  [facility] 06/27/2024, 1:53 PM  Recent Labs  Lab 06/21/24 0512 06/22/24 0457 06/23/24 1159 06/23/24 1809 06/26/24 0604 06/27/24 0539  HGB 10.2*  --  11.5*  --   --   --   ALBUMIN  --   --   --   --  2.0* 2.1*  CALCIUM  7.7*   < >  --    < >  8.1* 7.9*  PHOS  --   --   --   --  6.6* 5.9*  CREATININE 5.24*   < >  --    < > 7.04* 6.30*  K 3.1*   < >  --    < > 3.6 3.4*   < > = values in this interval not displayed.   No results for input(s): IRON, TIBC, FERRITIN in the last 168 hours. Inpatient medications:  amLODipine   5 mg Oral Daily   aspirin  EC  81 mg Oral Daily   buPROPion   150 mg Oral q morning   cephALEXin   500 mg Oral Q24H   Chlorhexidine  Gluconate Cloth  6 each Topical Daily   furosemide   80 mg Intravenous Q12H   gabapentin   100 mg Oral QHS   insulin  aspart  0-20 Units Subcutaneous TID WC   insulin  aspart  0-5 Units Subcutaneous QHS   levothyroxine   75 mcg Oral Q0600   pantoprazole   40 mg Oral Daily   rosuvastatin   10 mg Oral Q breakfast   sertraline   100 mg Oral Daily   tamsulosin   0.4 mg Oral QPC supper     HYDROmorphone  (DILAUDID ) injection, mouth rinse, oxyCODONE , polyethylene glycol, prochlorperazine 

## 2024-06-27 NOTE — Progress Notes (Signed)
 Patient ID: Sue Davidson, female   DOB: [birth year], 73 y.o.   MRN: [id number]  9 Days Post-Op Subjective: No complaints.  Objective: Vital signs in last 24 hours: Temp:  [97.6 F (36.4 C)-98 F (36.7 C)] 97.9 F (36.6 C) (08/20 1120) Pulse Rate:  [71-74] 72 (08/20 1120) Resp:  [17-19] 18 (08/20 0617) BP: (151-165)/(55-64) 154/64 (08/20 1120) SpO2:  [96 %-99 %] 97 % (08/20 1120) Weight:  [106.4 kg] 106.4 kg (08/20 0500)  Intake/Output from previous day: 08/19 0701 - 08/20 0700 In: 360 [P.O.:360] Out: 8400 [Urine:8400] Intake/Output this shift: Total I/O In: -  Out: 1450 [Urine:1450]  Physical Exam:  General: Alert and oriented   Lab Results: No results for input(s): HGB, HCT in the last 72 hours. BMET Recent Labs    06/26/24 0604 06/27/24 0539  NA 138 137  K 3.6 3.4*  CL 105 104  CO2 16* 17*  GLUCOSE 110* 136*  BUN 88* 80*  CREATININE 7.04* 6.30*  CALCIUM  8.1* 7.9*     Studies/Results: No results found.  Assessment/Plan: 1) Left ureteral stone/sepsis: S/P left ureteral stent 8/11.  On cefazolin  for pansensitive E coli culture.  Continue appropriate antibiotic therapy for 10-14 days.  Will arrange outpatient followup and treatment once acute medical issues and infection resolved.  Will sign off.  Please call if further questions.  Our office will call to schedule follow up. 2) AKI: Renal function now improving finally.   LOS: 9 days   Noretta Ferrara 06/27/2024, 12:50 PM

## 2024-06-27 NOTE — Progress Notes (Signed)
 PROGRESS NOTE  Sue Davidson  FMW:[id number] DOB: 1951/09/01 DOA: 06/18/2024 PCP: Clarice Nottingham, MD   Brief Narrative: Patient is 73 y.o. female with medical history significant of anemia,  anxiety, depression, bilateral carotid arterial disease, type 2 diabetes, class II obesity, GERD, grade 1 diastolic dysfunction,  pulmonary artery hypertension, hyperlipidemia,  sleep apnea,  history of urolithiasis presented to the ED with left flank pain.  In the ED patient was febrile tachycardic tachypneic, hypotensive and required supplemental oxygen.  Urinalysis showed abnormal urinalysis with more than 50 white cells.  COVID influenza and RSV was negative.  CBC showed elevated WBC at 14.8 with 85% neutrophils.  Lactic acid was initially elevated at 5.9 followed by 4.6.  CMP showed hyponatremia with sodium of 130 and creatinine elevation at 3.6 from baseline 0.8.  AST and ALT mildly elevated.  CT renal study showed left hydroureteronephrosis with 7 mm calculus in the proximal left ureter.  She had multiple falls at home after having generalized weakness.    Sepsis protocol ordered and urology was consulted.  Patient was given IV Rocephin  and vasopressors initially.  Patient was then seen by urology and underwent cystoscopy with left ureteral stent placement on 8/11.  Hospital course remarkable for worsening renal function.  Nephrology consulted.  Kidney function improving with IV diuresis  Assessment & Plan:  Principal Problem:   Severe sepsis (HCC) Active Problems:   Acute kidney injury (nontraumatic) (HCC)   Acute unilateral obstructive uropathy   Essential hypertension   Thrombocytopenia (HCC)   Hyponatremia   Obesity, Class III, BMI 40-49.9 (morbid obesity)   Dyslipidemia   Gastroesophageal reflux disease   OSA (obstructive sleep apnea)   Hypothyroidism   Acute UTI (urinary tract infection)   Urolithiasis   Hydronephrosis of left kidney   Depression   Uncontrolled type 2 diabetes mellitus  with hyperglycemia, with long-term current use of insulin  (HCC)   E coli bacteremia   Severe sepsis secondary to E. coli bacteremia:Presented with fever, leukocytosis.  Blood cultures/urine cultures showing pansensitive E. coli.  Antibiotics changed to oral  AKI; Baseline creatinine normal.  AKI secondary to sepsis, hypotension, she was also on ARB at home.  Kidney function improving, creatinine trending down .nephrology closely following.  On  IV diuretics  because of volume excess/LE edema.  No plan for dialysis.  No signs of uremia.  Having excellent diuresis  Acute unilateral obstructive uropathy: Status post stent placement in the left ureter on 8/11.  Had left ureteral calculus  Hypertension: Currently blood pressure stable.  Hypotensive on presentation.  Now hypertensive.  Amlodipine  restarted  Thrombocytopenia: Likely from sepsis, resolved  Obesity: BMI of 41.7  Hyperlipidemia: Takes Crestor  at home  GERD: Continue PPI  Hypothyroidism: Continue Synthyroid  OSA: Not on CPAP  Uncontrolled diabetes type 2: Currently on sliding scale.  Monitor blood sugars  Depression: oN Wellbutrin   Debility/deconditioning: PT recommending SNF on discharge.  TOC consulted          DVT prophylaxis:SCDs Start: 06/18/24 9375     Code Status: Full Code  Family Communication: Brother at bedside on 8/19  Patient status:Inpatient  Patient is from :home  Anticipated discharge to: SNF  Estimated DC date: After improvement in the renal function, nephrology clearance   Consultants: Urology, nephrology  Procedures: Stent placement  Antimicrobials:  Anti-infectives (From admission, onward)    Start     Dose/Rate Route Frequency Ordered Stop   06/25/24 2200  cephALEXin  (KEFLEX ) capsule 500 mg  500 mg Oral Every 24 hours 06/25/24 1317 06/28/24 2159   06/24/24 2200  ceFAZolin  (ANCEF ) IVPB 1 g/50 mL premix  Status:  Discontinued        1 g 100 mL/hr over 30 Minutes  Intravenous Every 24 hours 06/24/24 0737 06/25/24 1317   06/23/24 1000  ceFAZolin  (ANCEF ) IVPB 1 g/50 mL premix  Status:  Discontinued        1 g 100 mL/hr over 30 Minutes Intravenous Every 12 hours 06/23/24 0855 06/24/24 0737   06/20/24 1030  ceFAZolin  (ANCEF ) IVPB 2g/100 mL premix  Status:  Discontinued        2 g 200 mL/hr over 30 Minutes Intravenous Every 12 hours 06/20/24 0943 06/23/24 0855   06/19/24 0200  cefTRIAXone  (ROCEPHIN ) 2 g in sodium chloride  0.9 % 100 mL IVPB  Status:  Discontinued        2 g 200 mL/hr over 30 Minutes Intravenous Every 24 hours 06/18/24 0622 06/20/24 0943   06/18/24 0215  cefTRIAXone  (ROCEPHIN ) 2 g in sodium chloride  0.9 % 100 mL IVPB        2 g 200 mL/hr over 30 Minutes Intravenous Once 06/18/24 0205 06/18/24 0258       Subjective: Patient seen and examined at bedside today.  She looks comfortable.  Denies any complaints.  Having significant diuresis.  Lower extremity edema improving.  No new complaints.  Blood pressure trending up, restarted home amlodipine   Objective: Vitals:   06/27/24 0347 06/27/24 0500 06/27/24 0617 06/27/24 1120  BP: (!) 165/56  (!) 151/55 (!) 154/64  Pulse: 74  71 72  Resp: 19  18   Temp: 98 F (36.7 C)   97.9 F (36.6 C)  TempSrc: Oral     SpO2: 96%  97% 97%  Weight:  106.4 kg    Height:        Intake/Output Summary (Last 24 hours) at 06/27/2024 1151 Last data filed at 06/27/2024 0859 Gross per 24 hour  Intake --  Output 5050 ml  Net -5050 ml   Filed Weights   06/25/24 0414 06/26/24 0500 06/27/24 0500  Weight: 117.6 kg 114.9 kg 106.4 kg    Examination:  General exam: Overall comfortable, not in distress,obese HEENT: PERRL Respiratory system:  no wheezes or crackles  Cardiovascular system: S1 & S2 heard, RRR.  Gastrointestinal system: Abdomen is nondistended, soft and nontender. Central nervous system: Alert and oriented Extremities: Bilateral lower extremity pitting edema, no clubbing ,no cyanosis Skin:  No rashes, no ulcers,no icterus     Data Reviewed: I have personally reviewed following labs and imaging studies  CBC: Recent Labs  Lab 06/21/24 0512 06/23/24 1159  WBC 10.2 12.1*  NEUTROABS  --  8.7*  HGB 10.2* 11.5*  HCT 32.0* 36.4  MCV 92.0 92.9  PLT 128* 193   Basic Metabolic Panel: Recent Labs  Lab 06/24/24 0604 06/25/24 0506 06/25/24 1827 06/26/24 0604 06/27/24 0539  NA 134* 137 136 138 137  K 3.5 3.6 3.9 3.6 3.4*  CL 104 106 104 105 104  CO2 16* 16* 17* 16* 17*  GLUCOSE 89 86 96 110* 136*  BUN 77* 81* 84* 88* 80*  CREATININE 6.67* 7.17* 7.33* 7.04* 6.30*  CALCIUM  8.0* 8.0* 8.0* 8.1* 7.9*  PHOS  --   --   --  6.6* 5.9*     Recent Results (from the past 240 hours)  Blood Culture (routine x 2)     Status: Abnormal   Collection Time: 06/18/24  2:00  AM   Specimen: BLOOD  Result Value Ref Range Status   Specimen Description   Final    BLOOD RIGHT ANTECUBITAL Performed at [facility], [address] [place], [place] [address]    Special Requests   Final    BOTTLES DRAWN AEROBIC AND ANAEROBIC Blood Culture adequate volume Performed at [facility], [address] [place], [place] [address]    Culture  Setup Time   Final    GRAM NEGATIVE RODS IN BOTH AEROBIC AND ANAEROBIC BOTTLES CRITICAL VALUE NOTED.  VALUE IS CONSISTENT WITH PREVIOUSLY REPORTED AND CALLED VALUE.    Culture (A)  Final    ESCHERICHIA COLI SUSCEPTIBILITIES PERFORMED ON PREVIOUS CULTURE WITHIN THE LAST 5 DAYS. Performed at [facility], [address], [place], [place] [address]    Report Status 06/21/2024 FINAL  Final  Blood Culture (routine x 2)     Status: Abnormal   Collection Time: 06/18/24  2:10 AM   Specimen: BLOOD LEFT WRIST  Result Value Ref Range Status   Specimen Description   Final    BLOOD LEFT WRIST Performed at [facility], [address] [place], [place] [address]    Special Requests   Final    BOTTLES DRAWN AEROBIC AND  ANAEROBIC Blood Culture adequate volume Performed at [facility], [address] [place], [place] [address]    Culture  Setup Time   Final    GRAM NEGATIVE RODS IN BOTH AEROBIC AND ANAEROBIC BOTTLES CRITICAL RESULT CALLED TO, READ BACK BY AND VERIFIED WITH: PHARMD L POINTDEXTER 06/19/2024 @ 0022 BY AB Performed at [facility], [address], [place], [place] [address]    Culture ESCHERICHIA COLI (A)  Final   Report Status 06/20/2024 FINAL  Final   Organism ID, Bacteria ESCHERICHIA COLI  Final      Susceptibility   Escherichia coli - MIC*    AMPICILLIN 4 SENSITIVE Sensitive     CEFAZOLIN  Value in next row Sensitive      <=1 SENSITIVEThis is a modified FDA-approved test that has been validated and its performance characteristics determined by the reporting laboratory.  This laboratory is certified under the Clinical Laboratory Improvement Amendments CLIA as qualified to perform high complexity clinical laboratory testing.    CEFEPIME Value in next row Sensitive      <=1 SENSITIVEThis is a modified FDA-approved test that has been validated and its performance characteristics determined by the reporting laboratory.  This laboratory is certified under the Clinical Laboratory Improvement Amendments CLIA as qualified to perform high complexity clinical laboratory testing.    ERTAPENEM Value in next row Sensitive      <=1 SENSITIVEThis is a modified FDA-approved test that has been validated and its performance characteristics determined by the reporting laboratory.  This laboratory is certified under the Clinical Laboratory Improvement Amendments CLIA as qualified to perform high complexity clinical laboratory testing.    CEFTRIAXONE  Value in next row Sensitive      <=1 SENSITIVEThis is a modified FDA-approved test that has been validated and its performance characteristics determined by the reporting laboratory.  This laboratory is certified under the Clinical Laboratory  Improvement Amendments CLIA as qualified to perform high complexity clinical laboratory testing.    CIPROFLOXACIN Value in next row Sensitive      <=1 SENSITIVEThis is a modified FDA-approved test that has been validated and its performance characteristics determined by the reporting laboratory.  This laboratory is certified under the Clinical Laboratory Improvement Amendments CLIA as qualified to perform high complexity clinical laboratory testing.    GENTAMICIN Value in next row Sensitive      <=1 SENSITIVEThis is a modified FDA-approved test that has been validated and its performance characteristics determined by the reporting laboratory.  This laboratory is certified under the Clinical Laboratory Improvement Amendments CLIA as qualified to perform high complexity clinical laboratory testing.    MEROPENEM Value in next row Sensitive      <=1 SENSITIVEThis is a modified FDA-approved test that has been validated and its performance characteristics determined by the reporting laboratory.  This laboratory is certified under the Clinical Laboratory Improvement Amendments CLIA as qualified to perform high complexity clinical laboratory testing.    TRIMETH/SULFA Value in next row Sensitive      <=1 SENSITIVEThis is a modified FDA-approved test that has been validated and its performance characteristics determined by the reporting laboratory.  This laboratory is certified under the Clinical Laboratory Improvement Amendments CLIA as qualified to perform high complexity clinical laboratory testing.    AMPICILLIN/SULBACTAM Value in next row Sensitive      <=1 SENSITIVEThis is a modified FDA-approved test that has been validated and its performance characteristics determined by the reporting laboratory.  This laboratory is certified under the Clinical Laboratory Improvement Amendments CLIA as qualified to perform high complexity clinical laboratory testing.    PIP/TAZO Value in next row Sensitive ug/mL     <=4  SENSITIVEThis is a modified FDA-approved test that has been validated and its performance characteristics determined by the reporting laboratory.  This laboratory is certified under the Clinical Laboratory Improvement Amendments CLIA as qualified to perform high complexity clinical laboratory testing.    * ESCHERICHIA COLI  Blood Culture ID Panel (Reflexed)     Status: Abnormal   Collection Time: 06/18/24  2:10 AM  Result Value Ref Range Status   Enterococcus faecalis NOT DETECTED NOT DETECTED Final   Enterococcus Faecium NOT DETECTED NOT DETECTED Final   Listeria monocytogenes NOT DETECTED NOT DETECTED Final   Staphylococcus species NOT DETECTED NOT DETECTED Final   Staphylococcus aureus (BCID) NOT DETECTED NOT DETECTED Final   Staphylococcus epidermidis NOT DETECTED NOT DETECTED Final   Staphylococcus lugdunensis NOT DETECTED NOT DETECTED Final   Streptococcus species NOT DETECTED NOT DETECTED Final   Streptococcus agalactiae NOT DETECTED NOT DETECTED Final   Streptococcus pneumoniae NOT DETECTED NOT DETECTED Final   Streptococcus pyogenes NOT DETECTED NOT DETECTED Final   A.calcoaceticus-baumannii NOT DETECTED NOT DETECTED Final   Bacteroides fragilis NOT DETECTED NOT DETECTED Final   Enterobacterales DETECTED (A) NOT DETECTED Final    Comment: Enterobacterales represent a large order of gram negative bacteria, not a single organism. CRITICAL RESULT CALLED TO, READ BACK BY AND VERIFIED WITH: PHARMD L POINTDEXTER 06/19/2024 @ 0022 BY AB    Enterobacter cloacae complex NOT DETECTED NOT DETECTED Final   Escherichia coli DETECTED (A) NOT DETECTED Final    Comment: CRITICAL RESULT CALLED TO, READ BACK BY AND VERIFIED WITH: PHARMD L POINTDEXTER 06/19/2024 @ 0022 BY AB    Klebsiella aerogenes NOT DETECTED NOT DETECTED Final   Klebsiella oxytoca NOT DETECTED NOT DETECTED Final   Klebsiella pneumoniae NOT DETECTED NOT DETECTED Final   Proteus species NOT DETECTED NOT DETECTED Final    Salmonella species NOT DETECTED NOT DETECTED Final   Serratia marcescens NOT DETECTED NOT DETECTED Final   Haemophilus influenzae NOT DETECTED NOT DETECTED Final   Neisseria meningitidis NOT  DETECTED NOT DETECTED Final   Pseudomonas aeruginosa NOT DETECTED NOT DETECTED Final   Stenotrophomonas maltophilia NOT DETECTED NOT DETECTED Final   Candida albicans NOT DETECTED NOT DETECTED Final   Candida auris NOT DETECTED NOT DETECTED Final   Candida glabrata NOT DETECTED NOT DETECTED Final   Candida krusei NOT DETECTED NOT DETECTED Final   Candida parapsilosis NOT DETECTED NOT DETECTED Final   Candida tropicalis NOT DETECTED NOT DETECTED Final   Cryptococcus neoformans/gattii NOT DETECTED NOT DETECTED Final   CTX-M ESBL NOT DETECTED NOT DETECTED Final   Carbapenem resistance IMP NOT DETECTED NOT DETECTED Final   Carbapenem resistance KPC NOT DETECTED NOT DETECTED Final   Carbapenem resistance NDM NOT DETECTED NOT DETECTED Final   Carbapenem resist OXA 48 LIKE NOT DETECTED NOT DETECTED Final   Carbapenem resistance VIM NOT DETECTED NOT DETECTED Final    Comment: Performed at [facility], [address], [place], [place] [address]  Resp panel by RT-PCR (RSV, Flu A&B, Covid) Anterior Nasal Swab     Status: None   Collection Time: 06/18/24  3:11 AM   Specimen: Anterior Nasal Swab  Result Value Ref Range Status   SARS Coronavirus 2 by RT PCR NEGATIVE NEGATIVE Final    Comment: (NOTE) SARS-CoV-2 target nucleic acids are NOT DETECTED.  The SARS-CoV-2 RNA is generally detectable in upper respiratory specimens during the acute phase of infection. The lowest concentration of SARS-CoV-2 viral copies this assay can detect is 138 copies/mL. A negative result does not preclude SARS-Cov-2 infection and should not be used as the sole basis for treatment or other patient management decisions. A negative result may occur with  improper specimen collection/handling, submission of specimen other than  nasopharyngeal swab, presence of viral mutation(s) within the areas targeted by this assay, and inadequate number of viral copies(<138 copies/mL). A negative result must be combined with clinical observations, patient history, and epidemiological information. The expected result is Negative.  Fact Sheet for Patients:  BloggerCourse.com  Fact Sheet for Healthcare Providers:  SeriousBroker.it  This test is no t yet approved or cleared by the [place]  FDA and  has been authorized for detection and/or diagnosis of SARS-CoV-2 by FDA under an Emergency Use Authorization (EUA). This EUA will remain  in effect (meaning this test can be used) for the duration of the COVID-19 declaration under Section 564(b)(1) of the Act, 21 U.S.C.section 360bbb-3(b)(1), unless the authorization is terminated  or revoked sooner.       Influenza A by PCR NEGATIVE NEGATIVE Final   Influenza B by PCR NEGATIVE NEGATIVE Final    Comment: (NOTE) The Xpert Xpress SARS-CoV-2/FLU/RSV plus assay is intended as an aid in the diagnosis of influenza from Nasopharyngeal swab specimens and should not be used as a sole basis for treatment. Nasal washings and aspirates are unacceptable for Xpert Xpress SARS-CoV-2/FLU/RSV testing.  Fact Sheet for Patients: BloggerCourse.com  Fact Sheet for Healthcare Providers: SeriousBroker.it  This test is not yet approved or cleared by the [place]  FDA and has been authorized for detection and/or diagnosis of SARS-CoV-2 by FDA under an Emergency Use Authorization (EUA). This EUA will remain in effect (meaning this test can be used) for the duration of the COVID-19 declaration under Section 564(b)(1) of the Act, 21 U.S.C. section 360bbb-3(b)(1), unless the authorization is terminated or revoked.     Resp Syncytial Virus by PCR NEGATIVE NEGATIVE Final    Comment:  (NOTE) Fact Sheet for Patients: BloggerCourse.com  Fact Sheet for Healthcare Providers:  SeriousBroker.it  This test is not yet approved or cleared by the [place]  FDA and has been authorized for detection and/or diagnosis of SARS-CoV-2 by FDA under an Emergency Use Authorization (EUA). This EUA will remain in effect (meaning this test can be used) for the duration of the COVID-19 declaration under Section 564(b)(1) of the Act, 21 U.S.C. section 360bbb-3(b)(1), unless the authorization is terminated or revoked.  Performed at [facility], [address], [place], [place] [address]   Urine Culture     Status: Abnormal   Collection Time: 06/18/24  7:24 AM   Specimen: Urine, Cystoscope  Result Value Ref Range Status   Specimen Description   Final    CYSTOSCOPY URINE CYSTOSCOPE, PT ON CEFTRIAXONE  Performed at [facility], [address] [place], [place] [address]    Special Requests   Final    NONE Performed at [facility], [address], [place], [place] [address]    Culture 20,000 COLONIES/mL ESCHERICHIA COLI (A)  Final   Report Status 06/20/2024 FINAL  Final   Organism ID, Bacteria ESCHERICHIA COLI (A)  Final      Susceptibility   Escherichia coli - MIC*    AMPICILLIN 4 SENSITIVE Sensitive     CEFAZOLIN  Value in next row Sensitive      <=1 SENSITIVEThis is a modified FDA-approved test that has been validated and its performance characteristics determined by the reporting laboratory.  This laboratory is certified under the Clinical Laboratory Improvement Amendments CLIA as qualified to perform high complexity clinical laboratory testing.    CEFEPIME Value in next row Sensitive      <=1 SENSITIVEThis is a modified FDA-approved test that has been validated and its performance characteristics determined by the reporting laboratory.  This laboratory is certified under the  Clinical Laboratory Improvement Amendments CLIA as qualified to perform high complexity clinical laboratory testing.    ERTAPENEM Value in next row Sensitive      <=1 SENSITIVEThis is a modified FDA-approved test that has been validated and its performance characteristics determined by the reporting laboratory.  This laboratory is certified under the Clinical Laboratory Improvement Amendments CLIA as qualified to perform high complexity clinical laboratory testing.    CEFTRIAXONE  Value in next row Sensitive      <=1 SENSITIVEThis is a modified FDA-approved test that has been validated and its performance characteristics determined by the reporting laboratory.  This laboratory is certified under the Clinical Laboratory Improvement Amendments CLIA as qualified to perform high complexity clinical laboratory testing.    CIPROFLOXACIN Value in next row Sensitive      <=1 SENSITIVEThis is a modified FDA-approved test that has been validated and its performance characteristics determined by the reporting laboratory.  This laboratory is certified under the Clinical Laboratory Improvement Amendments CLIA as qualified to perform high complexity clinical laboratory testing.    GENTAMICIN Value in next row Sensitive      <=1 SENSITIVEThis is a modified FDA-approved test that has been validated and its performance characteristics determined by the reporting laboratory.  This laboratory is certified under the Clinical Laboratory Improvement Amendments CLIA as qualified to perform high complexity clinical laboratory testing.    MEROPENEM Value in next row Sensitive      <=1 SENSITIVEThis is a modified FDA-approved test that has been validated and its performance characteristics determined by the reporting laboratory.  This laboratory is certified under the Clinical Laboratory Improvement Amendments CLIA as qualified to perform high complexity clinical  laboratory testing.    TRIMETH/SULFA Value in next row Sensitive       <=1 SENSITIVEThis is a modified FDA-approved test that has been validated and its performance characteristics determined by the reporting laboratory.  This laboratory is certified under the Clinical Laboratory Improvement Amendments CLIA as qualified to perform high complexity clinical laboratory testing.    AMPICILLIN/SULBACTAM Value in next row Sensitive      <=1 SENSITIVEThis is a modified FDA-approved test that has been validated and its performance characteristics determined by the reporting laboratory.  This laboratory is certified under the Clinical Laboratory Improvement Amendments CLIA as qualified to perform high complexity clinical laboratory testing.    PIP/TAZO Value in next row Sensitive ug/mL     <=4 SENSITIVEThis is a modified FDA-approved test that has been validated and its performance characteristics determined by the reporting laboratory.  This laboratory is certified under the Clinical Laboratory Improvement Amendments CLIA as qualified to perform high complexity clinical laboratory testing.    * 20,000 COLONIES/mL ESCHERICHIA COLI  MRSA Next Gen by PCR, Nasal     Status: None   Collection Time: 06/18/24 10:53 AM   Specimen: Nasal Mucosa; Nasal Swab  Result Value Ref Range Status   MRSA by PCR Next Gen NOT DETECTED NOT DETECTED Final    Comment: (NOTE) The GeneXpert MRSA Assay (FDA approved for NASAL specimens only), is one component of a comprehensive MRSA colonization surveillance program. It is not intended to diagnose MRSA infection nor to guide or monitor treatment for MRSA infections. Test performance is not FDA approved in patients less than 67 years old. Performed at [facility], [address], [place], [place] [address]      Radiology Studies: No results found.  Scheduled Meds:  amLODipine   5 mg Oral Daily   aspirin  EC  81 mg Oral Daily   buPROPion   150 mg Oral q morning   cephALEXin   500 mg Oral Q24H   Chlorhexidine  Gluconate Cloth  6  each Topical Daily   furosemide   80 mg Intravenous Q12H   gabapentin   100 mg Oral QHS   insulin  aspart  0-20 Units Subcutaneous TID WC   insulin  aspart  0-5 Units Subcutaneous QHS   levothyroxine   75 mcg Oral Q0600   pantoprazole   40 mg Oral Daily   rosuvastatin   10 mg Oral Q breakfast   sertraline   100 mg Oral Daily   tamsulosin   0.4 mg Oral QPC supper   Continuous Infusions:     LOS: 9 days   Ivonne Mustache, MD [facility] P8/20/2025, 11:51 AM

## 2024-06-27 NOTE — TOC Progression Note (Addendum)
 Transition of Care [facility]) - Progression Note    Patient Details  Name: Sue Davidson MRN: [id number] Date of Birth: 03/24/1951  Transition of Care [facility]) CM/SW Contact  Heather DELENA Saltness, LCSW Phone Number: 06/27/2024, 9:39 AM  Clinical Narrative:    Pt's insurance authorization for SNF rehab at [facility] has been approved, Auth ID: [id number]. Insurance authorization is good through Friday 8/22. CSW notified Levon at [facility]. Per MD, still awaiting medical readiness. TOC will continue to follow.   Expected Discharge Plan: Home/Self Care Barriers to Discharge: Continued Medical Work up   Expected Discharge Plan and Services   Discharge Planning Services: CM Consult   Living arrangements for the past 2 months: Apartment                   Social Drivers of Health (SDOH) Interventions SDOH Screenings   Food Insecurity: Food Insecurity Present (06/21/2024)  Housing: Low Risk  (06/21/2024)  Transportation Needs: No Transportation Needs (06/21/2024)  Utilities: Not At Risk (06/21/2024)  Social Connections: Moderately Integrated (06/18/2024)  Tobacco Use: Low Risk  (06/18/2024)    Readmission Risk Interventions    06/21/2024    5:03 PM  Readmission Risk Prevention Plan  Transportation Screening Complete  PCP or Specialist Appt within 3-5 Days Complete  HRI or Home Care Consult Complete  Social Work Consult for Recovery Care Planning/Counseling Complete  Palliative Care Screening Not Applicable  Medication Review Oceanographer) Complete    Signed: Heather Saltness, MSW, LCSW Clinical Social Worker Inpatient Care Management 06/27/2024 9:41 AM

## 2024-06-27 NOTE — Plan of Care (Signed)

## 2024-06-27 NOTE — Progress Notes (Signed)
 Physical Therapy Treatment Patient Details Name: Sue Davidson MRN: [id number] DOB: 06/21/1951 Today's Date: 06/27/2024   History of Present Illness ita Sue Davidson is a 73 y.o. female admitted 06/18/24 with L flank pain, multiple falls, weakness.CT renal study showed left hydroureteronephrosis with 7 mm calculus in the proximal left ureter, S/P cystoscopy with L ureteral stent placed.  PMH: anemia,  anxiety, depression, bilateral carotid arterial disease, type 2 diabetes, class II obesity, GERD, grade 1 diastolic dysfunction,  pulmonary artery hypertension, hyperlipidemia,  sleep apnea,  history of urolithiasis presented to the ED with left flank pain.  In the ED patient was febrile tachycardic tachypneic, hypotensive and required supplemental oxygen.    PT Comments  AxO x 3 pleasant Lady who lives home with daughter.  Was able to amb in her Apartment with NO AD but did use a walker when out limited distances. Sister Sue Davidson in room during session. Assisted Pt OOB to amb was difficult.   General bed mobility comments: required Max Assist + 2 with elevared HOB and increased effort to complete scooting to EOB. General transfer comment: from eelvated bed Pt reuired + 2 Mod Assist and from lower recliner level Pt required + 2 Max Assist to rise with VC's on proper hand placement General Gait Details: First, Used EVA walker Pt Required + 2 asisst Max Assist and heavy lean on B platform EVA walker.  Very small short shuffled steps with decreased weight shift to her right.  Right hip is bad.  Limited amb distance of 13 feet due to effort and max c/o fatigue/weakness.  Recliner closely following behind. Second walk used a regulare RW, Pt required + 2 Max Assist very limited distance of 3 feet.  Limited by fatigue/weakness due to extended hopsital length of stay. Lpt has rec Pt will need ST Rehab at SNF to address mobility and functional decline prior to safely returning home.    If plan is discharge home,  recommend the following: Two people to help with walking and/or transfers;A lot of help with bathing/dressing/bathroom   Can travel by private vehicle     No  Equipment Recommendations  None recommended by PT    Recommendations for Other Services       Precautions / Restrictions Precautions Precautions: Fall Precaution/Restrictions Comments: bad RIGHT hip Restrictions Weight Bearing Restrictions Per Provider Order: No     Mobility  Bed Mobility Overal bed mobility: Needs Assistance Bed Mobility: Supine to Sit     Supine to sit: Max assist, +2 for physical assistance     General bed mobility comments: required Max Assist + 2 with elevared HOB and increased effort to complete scooting to EOB.    Transfers Overall transfer level: Needs assistance Equipment used: Bilateral platform walker, Rolling walker (2 wheels) Transfers: Sit to/from Stand Sit to Stand: Mod assist, Max assist, +2 physical assistance, +2 safety/equipment           General transfer comment: from eelvated bed Pt reuired + 2 Mod Assist and from lower recliner level Pt required + 2 Max Assist to rise with VC's on proper hand placement    Ambulation/Gait Ambulation/Gait assistance: Max assist, Total assist, +2 physical assistance, +2 safety/equipment Gait Distance (Feet): 16 Feet Assistive device: Elyn Finder, Rolling walker (2 wheels)   Gait velocity: decreased     General Gait Details: First, Used EVA walker Pt Required + 2 asisst Max Assist and heavy lean on B platform EVA walker.  Very small short shuffled steps with decreased  weight shift to her right.  Right hip is bad.  Limited amb distance of 13 feet due to effort and max c/o fatigue/weakness.  Recliner closely following behind. Second walk used a regulare RW, Pt required + 2 Max Assist very limited distance of 3 feet.  Limited by fatigue/weakness due to extended hopsital length of stay.   Stairs             Wheelchair Mobility      Tilt Bed    Modified Rankin (Stroke Patients Only)       Balance                                            Communication Communication Communication: No apparent difficulties  Cognition Arousal: Alert Behavior During Therapy: WFL for tasks assessed/performed   PT - Cognitive impairments: No apparent impairments                       PT - Cognition Comments: AxO x 3 pleasant Lady who lives home alone.  Was able to amb in her Apartment with NO AD but did use a walker when out limited distances. Following commands: Intact      Cueing Cueing Techniques: Verbal cues  Exercises      General Comments        Pertinent Vitals/Pain Pain Assessment Pain Assessment: No/denies pain    Home Living                          Prior Function            PT Goals (current goals can now be found in the care plan section) Progress towards PT goals: Progressing toward goals    Frequency    Min 2X/week      PT Plan      Co-evaluation              AM-PAC PT 6 Clicks Mobility   Outcome Measure  Help needed turning from your back to your side while in a flat bed without using bedrails?: A Lot Help needed moving from lying on your back to sitting on the side of a flat bed without using bedrails?: A Lot Help needed moving to and from a bed to a chair (including a wheelchair)?: A Lot Help needed standing up from a chair using your arms (e.g., wheelchair or bedside chair)?: A Lot Help needed to walk in hospital room?: A Lot Help needed climbing 3-5 steps with a railing? : Total 6 Click Score: 11    End of Session Equipment Utilized During Treatment: Gait belt Activity Tolerance: Patient limited by fatigue Patient left: in chair;with call bell/phone within reach;with family/visitor present Nurse Communication: Mobility status PT Visit Diagnosis: Unsteadiness on feet (R26.81);Other abnormalities of gait and mobility  (R26.89);Muscle weakness (generalized) (M62.81);History of falling (Z91.81);Pain Pain - Right/Left: Right     Time: 1352-1416 PT Time Calculation (min) (ACUTE ONLY): 24 min  Charges:    $Gait Training: 8-22 mins $Therapeutic Activity: 8-22 mins PT General Charges $$ ACUTE PT VISIT: 1 Visit                     Katheryn Leap  PTA Acute  Rehabilitation Services Office M-F          [phone]

## 2024-06-28 DIAGNOSIS — R2689 Other abnormalities of gait and mobility: Secondary | ICD-10-CM | POA: Diagnosis not present

## 2024-06-28 DIAGNOSIS — E785 Hyperlipidemia, unspecified: Secondary | ICD-10-CM | POA: Diagnosis not present

## 2024-06-28 DIAGNOSIS — N170 Acute kidney failure with tubular necrosis: Secondary | ICD-10-CM | POA: Diagnosis not present

## 2024-06-28 DIAGNOSIS — N179 Acute kidney failure, unspecified: Secondary | ICD-10-CM | POA: Diagnosis not present

## 2024-06-28 DIAGNOSIS — N138 Other obstructive and reflux uropathy: Secondary | ICD-10-CM | POA: Diagnosis not present

## 2024-06-28 DIAGNOSIS — M6281 Muscle weakness (generalized): Secondary | ICD-10-CM | POA: Diagnosis not present

## 2024-06-28 DIAGNOSIS — N200 Calculus of kidney: Secondary | ICD-10-CM | POA: Diagnosis not present

## 2024-06-28 DIAGNOSIS — Z743 Need for continuous supervision: Secondary | ICD-10-CM | POA: Diagnosis not present

## 2024-06-28 DIAGNOSIS — E039 Hypothyroidism, unspecified: Secondary | ICD-10-CM | POA: Diagnosis not present

## 2024-06-28 DIAGNOSIS — R1310 Dysphagia, unspecified: Secondary | ICD-10-CM | POA: Diagnosis not present

## 2024-06-28 DIAGNOSIS — Z7401 Bed confinement status: Secondary | ICD-10-CM | POA: Diagnosis not present

## 2024-06-28 DIAGNOSIS — E1165 Type 2 diabetes mellitus with hyperglycemia: Secondary | ICD-10-CM | POA: Diagnosis not present

## 2024-06-28 DIAGNOSIS — F32A Depression, unspecified: Secondary | ICD-10-CM | POA: Diagnosis not present

## 2024-06-28 DIAGNOSIS — E871 Hypo-osmolality and hyponatremia: Secondary | ICD-10-CM | POA: Diagnosis not present

## 2024-06-28 DIAGNOSIS — F329 Major depressive disorder, single episode, unspecified: Secondary | ICD-10-CM | POA: Diagnosis not present

## 2024-06-28 DIAGNOSIS — I10 Essential (primary) hypertension: Secondary | ICD-10-CM | POA: Diagnosis not present

## 2024-06-28 DIAGNOSIS — D559 Anemia due to enzyme disorder, unspecified: Secondary | ICD-10-CM | POA: Diagnosis not present

## 2024-06-28 DIAGNOSIS — I509 Heart failure, unspecified: Secondary | ICD-10-CM | POA: Diagnosis not present

## 2024-06-28 DIAGNOSIS — L89612 Pressure ulcer of right heel, stage 2: Secondary | ICD-10-CM | POA: Diagnosis not present

## 2024-06-28 DIAGNOSIS — N1339 Other hydronephrosis: Secondary | ICD-10-CM | POA: Diagnosis not present

## 2024-06-28 DIAGNOSIS — F419 Anxiety disorder, unspecified: Secondary | ICD-10-CM | POA: Diagnosis not present

## 2024-06-28 DIAGNOSIS — D649 Anemia, unspecified: Secondary | ICD-10-CM | POA: Diagnosis not present

## 2024-06-28 DIAGNOSIS — E119 Type 2 diabetes mellitus without complications: Secondary | ICD-10-CM | POA: Diagnosis not present

## 2024-06-28 DIAGNOSIS — N201 Calculus of ureter: Secondary | ICD-10-CM | POA: Diagnosis not present

## 2024-06-28 DIAGNOSIS — R531 Weakness: Secondary | ICD-10-CM | POA: Diagnosis not present

## 2024-06-28 DIAGNOSIS — D696 Thrombocytopenia, unspecified: Secondary | ICD-10-CM | POA: Diagnosis not present

## 2024-06-28 DIAGNOSIS — G4733 Obstructive sleep apnea (adult) (pediatric): Secondary | ICD-10-CM | POA: Diagnosis not present

## 2024-06-28 DIAGNOSIS — M25562 Pain in left knee: Secondary | ICD-10-CM | POA: Diagnosis not present

## 2024-06-28 DIAGNOSIS — R5381 Other malaise: Secondary | ICD-10-CM | POA: Diagnosis not present

## 2024-06-28 DIAGNOSIS — K219 Gastro-esophageal reflux disease without esophagitis: Secondary | ICD-10-CM | POA: Diagnosis not present

## 2024-06-28 DIAGNOSIS — N209 Urinary calculus, unspecified: Secondary | ICD-10-CM | POA: Diagnosis not present

## 2024-06-28 DIAGNOSIS — R6520 Severe sepsis without septic shock: Secondary | ICD-10-CM | POA: Diagnosis not present

## 2024-06-28 DIAGNOSIS — A419 Sepsis, unspecified organism: Secondary | ICD-10-CM | POA: Diagnosis not present

## 2024-06-28 DIAGNOSIS — R2681 Unsteadiness on feet: Secondary | ICD-10-CM | POA: Diagnosis not present

## 2024-06-28 LAB — RENAL FUNCTION PANEL
Albumin: 2.2 g/dL — ABNORMAL LOW (ref 3.5–5.0)
Anion gap: 14 (ref 5–15)
BUN: 78 mg/dL — ABNORMAL HIGH (ref 8–23)
CO2: 24 mmol/L (ref 22–32)
Calcium: 8 mg/dL — ABNORMAL LOW (ref 8.9–10.3)
Chloride: 100 mmol/L (ref 98–111)
Creatinine, Ser: 5.23 mg/dL — ABNORMAL HIGH (ref 0.44–1.00)
GFR, Estimated: 8 mL/min — ABNORMAL LOW (ref >60)
Glucose, Bld: 146 mg/dL — ABNORMAL HIGH (ref 70–99)
Phosphorus: 5.2 mg/dL — ABNORMAL HIGH (ref 2.5–4.6)
Potassium: 3.1 mmol/L — ABNORMAL LOW (ref 3.5–5.1)
Sodium: 138 mmol/L (ref 135–145)

## 2024-06-28 LAB — GLUCOSE, CAPILLARY
Glucose-Capillary: 149 mg/dL — ABNORMAL HIGH (ref 70–99)
Glucose-Capillary: 162 mg/dL — ABNORMAL HIGH (ref 70–99)

## 2024-06-28 MED ORDER — ROSUVASTATIN CALCIUM 10 MG PO TABS: 10.0000 mg | ORAL_TABLET | Freq: Every day | ORAL | Status: DC

## 2024-06-28 MED ORDER — HYDROMORPHONE HCL 1 MG/ML IJ SOLN
0.5000 mg | Freq: Once | INTRAMUSCULAR | Status: AC
  Administered 2024-06-28: 0.5 mg via INTRAVENOUS
  Filled 2024-06-28: qty 0.5

## 2024-06-28 MED ORDER — AMLODIPINE BESYLATE 5 MG PO TABS: 5.0000 mg | ORAL_TABLET | Freq: Every day | ORAL | Status: DC

## 2024-06-28 MED ORDER — FUROSEMIDE 40 MG PO TABS
80.0000 mg | ORAL_TABLET | Freq: Every day | ORAL | Status: DC
  Filled 2024-06-28: qty 2

## 2024-06-28 MED ORDER — POTASSIUM CHLORIDE CRYS ER 20 MEQ PO TBCR
40.0000 meq | EXTENDED_RELEASE_TABLET | Freq: Every day | ORAL | Status: DC
  Administered 2024-06-28: 40 meq via ORAL
  Filled 2024-06-28: qty 2

## 2024-06-28 NOTE — Progress Notes (Signed)
 Report called to [facility]; all questions answered; patient awaiting transport; will continue to monitor

## 2024-06-28 NOTE — TOC Progression Note (Signed)
 Transition of Care [facility]) - Progression Note    Patient Details  Name: Sue Davidson MRN: [id number] Date of Birth: 06-20-1951  Transition of Care [facility]) CM/SW Contact  Sonda Manuella Quill, RN Phone Number: 06/28/2024, 10:46 AM  Clinical Narrative:    Pt not medically ready for d/c per Dr Jillian; Levon at [facility] notified.   Expected Discharge Plan: Home/Self Care Barriers to Discharge: Continued Medical Work up               Expected Discharge Plan and Services   Discharge Planning Services: CM Consult   Living arrangements for the past 2 months: Apartment                                       Social Drivers of Health (SDOH) Interventions SDOH Screenings   Food Insecurity: Food Insecurity Present (06/21/2024)  Housing: Low Risk  (06/21/2024)  Transportation Needs: No Transportation Needs (06/21/2024)  Utilities: Not At Risk (06/21/2024)  Social Connections: Moderately Integrated (06/18/2024)  Tobacco Use: Low Risk  (06/18/2024)    Readmission Risk Interventions    06/21/2024    5:03 PM  Readmission Risk Prevention Plan  Transportation Screening Complete  PCP or Specialist Appt within 3-5 Days Complete  HRI or Home Care Consult Complete  Social Work Consult for Recovery Care Planning/Counseling Complete  Palliative Care Screening Not Applicable  Medication Review Oceanographer) Complete

## 2024-06-28 NOTE — TOC Transition Note (Signed)
 Transition of Care [facility]) - Discharge Note   Patient Details  Name: Sue Davidson MRN: [id number] Date of Birth: August 01, 1951  Transition of Care [facility]) CM/SW Contact:  Sonda Manuella Quill, RN Phone Number: 06/28/2024, 11:17 AM   Clinical Narrative:    Notified pt ready for d/c; pt d/c to [facility] w/ transport by [facility]; spoke w/ pt and family in room; pt agreed to d/c plan and will notify her sister; Nikki at [employer]; she gave RM # [phone], call report # [phone]; PT[facility] called at 1125; spoke w/ Tammy; no TOC needs.   Final next level of care: Skilled Nursing Facility Barriers to Discharge: No Barriers Identified   Patient Goals and CMS Choice Patient states their goals for this hospitalization and ongoing recovery are:: home CMS Medicare.gov Compare Post Acute Care list provided to:: Patient   [place] ownership interest in [facility].provided to:: Patient    Discharge Placement              Patient chooses bed at: [facility] Patient to be transferred to facility by: PTAR Name of family member notified: patient will notify her sister Patient and family notified of of transfer: 06/28/24  Discharge Plan and Services Additional resources added to the After Visit Summary for     Discharge Planning Services: CM Consult                                 Social Drivers of Health (SDOH) Interventions SDOH Screenings   Food Insecurity: Food Insecurity Present (06/21/2024)  Housing: Low Risk  (06/21/2024)  Transportation Needs: No Transportation Needs (06/21/2024)  Utilities: Not At Risk (06/21/2024)  Social Connections: Moderately Integrated (06/18/2024)  Tobacco Use: Low Risk  (06/18/2024)     Readmission Risk Interventions    06/21/2024    5:03 PM  Readmission Risk Prevention Plan  Transportation Screening Complete  PCP or Specialist Appt within 3-5 Days Complete  HRI or Home Care Consult Complete  Social Work Consult  for Recovery Care Planning/Counseling Complete  Palliative Care Screening Not Applicable  Medication Review Oceanographer) Complete

## 2024-06-28 NOTE — Discharge Summary (Signed)
 Physician Discharge Summary  Sue Davidson FMW:[id number] DOB: Jul 14, 1951 DOA: 06/18/2024  PCP: Clarice Nottingham, MD  Admit date: 06/18/2024 Discharge date: 06/28/2024  Admitted From: Home Disposition:  Home  Discharge Condition:Stable CODE STATUS:FULL Diet recommendation: Heart Healthy    Brief/Interim Summary: Patient is 73 y.o. female with medical history significant of anemia,  anxiety, depression, bilateral carotid arterial disease, type 2 diabetes, class II obesity, GERD, grade 1 diastolic dysfunction,  pulmonary artery hypertension, hyperlipidemia,  sleep apnea,  history of urolithiasis presented to the ED with left flank pain.  In the ED patient was febrile tachycardic tachypneic, hypotensive and required supplemental oxygen.  Urinalysis showed abnormal urinalysis with more than 50 white cells.  COVID influenza and RSV was negative.  CBC showed elevated WBC at 14.8 with 85% neutrophils.  Lactic acid was initially elevated at 5.9 followed by 4.6.  CMP showed hyponatremia with sodium of 130 and creatinine elevation at 3.6 from baseline 0.8.  AST and ALT mildly elevated.  CT renal study showed left hydroureteronephrosis with 7 mm calculus in the proximal left ureter.  She had multiple falls at home after having generalized weakness.    Sepsis protocol ordered and urology was consulted.  Patient was given IV Rocephin  and vasopressors initially.  Patient was then seen by urology and underwent cystoscopy with left ureteral stent placement on 8/11.  Hospital course remarkable for worsening renal function.  Nephrology consulted.  Kidney function now significantly improving after given IV Lasix .  Nephrology signed off and recommend to continue  oral Lasix  at home dose.  PT recommended SNF on discharge.  Medically stable for discharge to SNF today.  She will follow-up with urology as an outpatient  Following problems were addressed during the hospitalization:  Severe sepsis secondary to E. coli  bacteremia:Presented with fever, leukocytosis.  Blood cultures/urine cultures showing pansensitive E. coli.  Antibiotics changed to oral and she completed the course   AKI; Baseline creatinine normal.  AKI secondary to sepsis, hypotension, she was also on ARB at home.  Started on IV Lasix  with excellent diuresis and kidney function is significantly improving.  Check BMP in a week   Acute unilateral obstructive uropathy: Status post stent placement in the left ureter on 8/11.  Had left ureteral calculus.  He is to follow-up with urology as an outpatient   Hypertension: Continue amlodipine    Thrombocytopenia: Likely from sepsis, resolved   Obesity: BMI of 41.7   Hyperlipidemia: Takes Crestor  at home   GERD: Continue PPI   Hypothyroidism: Continue Synthyroid   OSA: Not on CPAP   Uncontrolled diabetes type 2: Continue home regimen  Depression: On wellbutrin    Debility/deconditioning: PT recommending SNF on discharge.  TOC consulted     Discharge Diagnoses:  Principal Problem:   Severe sepsis (HCC) Active Problems:   Acute kidney injury (nontraumatic) (HCC)   Acute unilateral obstructive uropathy   Essential hypertension   Thrombocytopenia (HCC)   Hyponatremia   Obesity, Class III, BMI 40-49.9 (morbid obesity)   Dyslipidemia   Gastroesophageal reflux disease   OSA (obstructive sleep apnea)   Hypothyroidism   Acute UTI (urinary tract infection)   Urolithiasis   Hydronephrosis of left kidney   Depression   Uncontrolled type 2 diabetes mellitus with hyperglycemia, with long-term current use of insulin  (HCC)   E coli bacteremia    Discharge Instructions  Discharge Instructions     Diet - low sodium heart healthy   Complete by: As directed    Discharge instructions  Complete by: As directed    1)Please take your medications as instructed 2)Do a BMP test in 3 days to check your kidney function and potassium level 3)Follo up with your urologist as an outpatient in 1  to 2 weeks   Increase activity slowly   Complete by: As directed    No wound care   Complete by: As directed       Allergies as of 06/28/2024       Reactions   Actos [pioglitazone] Other (See Comments)   Unknown reaction   Codeine Nausea And Vomiting   Ms Contin  [morphine ] Other (See Comments)   Altered mental state Somnolence   Neurontin  [gabapentin ] Other (See Comments)   Confusion Altered mental state   Ozempic (0.25 Or 0.5 Mg-dose) [semaglutide(0.25 Or 0.5mg -dos)] Nausea And Vomiting   Tape Rash        Medication List     STOP taking these medications    morphine  15 MG tablet Commonly known as: MSIR   olmesartan  40 MG tablet Commonly known as: BENICAR    ondansetron  4 MG disintegrating tablet Commonly known as: ZOFRAN -ODT       TAKE these medications    acetaminophen  650 MG CR tablet Commonly known as: TYLENOL  Take 1,300 mg by mouth every 8 (eight) hours as needed for pain.   amLODipine  5 MG tablet Commonly known as: NORVASC  Take 1 tablet (5 mg total) by mouth daily. Start taking on: June 29, 2024 What changed:  medication strength how much to take   aspirin  81 MG tablet Take 81 mg by mouth daily.   buPROPion  150 MG 24 hr tablet Commonly known as: WELLBUTRIN  XL Take 150 mg by mouth daily.   furosemide  80 MG tablet Commonly known as: LASIX  TAKE 1 TABLET BY MOUTH EVERY DAY   levothyroxine  75 MCG tablet Commonly known as: SYNTHROID  Take 75 mcg by mouth daily before breakfast.   Mounjaro 5 MG/0.5ML Pen Generic drug: tirzepatide Inject 5 mg into the skin every Monday.   NovoLIN 70/30 (70-30) 100 UNIT/ML injection Generic drug: insulin  NPH-regular Human Inject 10-20 Units into the skin See admin instructions. Inject 10-20 units into the skin with breakfast, inject 15 units with lunch if BS > 110 (otherwise no insulin ) and inject 20 units with dinner.   omeprazole 40 MG capsule Commonly known as: PRILOSEC Take 40 mg by mouth 2 (two) times  daily.   ondansetron  8 MG tablet Commonly known as: ZOFRAN  Take 8 mg by mouth daily as needed for nausea or vomiting.   rosuvastatin  10 MG tablet Commonly known as: CRESTOR  Take 1 tablet (10 mg total) by mouth daily with breakfast. Start taking on: June 29, 2024 What changed:  medication strength how much to take Another medication with the same name was removed. Continue taking this medication, and follow the directions you see here.   sertraline  100 MG tablet Commonly known as: ZOLOFT  Take 100 mg by mouth daily.   tamsulosin  0.4 MG Caps capsule Commonly known as: FLOMAX  Take 1 capsule (0.4 mg total) by mouth daily after supper. What changed: when to take this   VITAMIN D-3 PO Take 1 capsule by mouth daily.        Contact information for follow-up providers     [facility] Follow up.   Why: Our office will call to schedule. Contact information: [address] [place] [address] [place]  [address] [phone]             Contact information  for after-discharge care     Destination     [facility] .   Service: Skilled Nursing Contact information: [address] [place]  [address] [phone]                    Allergies  Allergen Reactions   Actos [Pioglitazone] Other (See Comments)    Unknown reaction   Codeine Nausea And Vomiting   Ms Contin  [Morphine ] Other (See Comments)    Altered mental state Somnolence   Neurontin  [Gabapentin ] Other (See Comments)    Confusion Altered mental state   Ozempic (0.25 Or 0.5 Mg-Dose) [Semaglutide(0.25 Or 0.5mg -Dos)] Nausea And Vomiting   Tape Rash    Consultations: Urology, nephrology   Procedures/Studies: US  RENAL Result Date: 06/21/2024 CLINICAL DATA:  [id number] Acute kidney injury (HCC) [id number] EXAM: RENAL / URINARY TRACT ULTRASOUND COMPLETE COMPARISON:  June 18, 2024 FINDINGS: Right Kidney: Renal measurements: 12.3 x 6.3 x 6.1 cm = volume: 246  mL.Normal echogenicity. No mass. No hydronephrosis or visualized nephrolithiasis. Left Kidney: Renal measurements: 13.6 x 6.9 x 6 cm = volume: 293 mL. Normal echogenicity. No mass. No hydronephrosis or visualized nephrolithiasis. The left ureteral stent was not well visualized by ultrasound. Bladder: Completely decompressed with a urinary catheter in place. Other: None. IMPRESSION: No hydronephrosis or visualized nephrolithiasis in either kidney. Electronically Signed   By: Rogelia Myers M.D.   On: 06/21/2024 19:58   DG Pelvis Portable Result Date: 06/21/2024 CLINICAL DATA:  Pain. EXAM: PORTABLE PELVIS 1-2 VIEWS COMPARISON:  CT 06/18/2024 FINDINGS: Portions of the left ureteral stent are in place, the distal pigtail in the region of the pelvis. No definite stone or stone fragments along the course of the stent. Bony pelvis is intact. No fracture. No evidence of erosion or focal bone abnormality. Mild degenerative change of the pubic symphysis and sacroiliac joints. Vascular calcifications are seen. IMPRESSION: Portions of the left ureteral stent are in place. No acute osseous abnormality. Electronically Signed   By: Andrea Gasman M.D.   On: 06/21/2024 16:13   DG C-Arm 1-60 Min-No Report Result Date: 06/18/2024 Fluoroscopy was utilized by the requesting physician.  No radiographic interpretation.   DG Chest Port 1 View Result Date: 06/18/2024 CLINICAL DATA:  Possible sepsis EXAM: PORTABLE CHEST 1 VIEW COMPARISON:  04/13/2017 FINDINGS: Cardiac shadow is prominent but accentuated by the frontal technique. The lungs are clear. No bony abnormality is noted. IMPRESSION: No acute abnormality noted. Electronically Signed   By: Oneil Devonshire M.D.   On: 06/18/2024 03:13   CT Renal Stone Study Result Date: 06/18/2024 CLINICAL DATA:  Flank pain EXAM: CT ABDOMEN AND PELVIS WITHOUT CONTRAST TECHNIQUE: Multidetector CT imaging of the abdomen and pelvis was performed following the standard protocol without IV  contrast. RADIATION DOSE REDUCTION: This exam was performed according to the departmental dose-optimization program which includes automated exposure control, adjustment of the mA and/or kV according to patient size and/or use of iterative reconstruction technique. COMPARISON:  06/15/2024 FINDINGS: Lower chest: Mild left basilar atelectasis is noted. Hepatobiliary: No focal liver abnormality is seen. Status post cholecystectomy. No biliary dilatation. Pancreas: Unremarkable. No pancreatic ductal dilatation or surrounding inflammatory changes. Spleen: Normal in size without focal abnormality. Adrenals/Urinary Tract: Adrenal glands are within normal limits. Right kidney shows no calculi or obstructive changes. Left kidney demonstrates proximal ureteral stone stable in appearance from the prior exam measuring 6 mm. Mild perinephric stranding is seen. No left renal calculi are noted. The bladder is within normal  limits. Stomach/Bowel: No obstructive or inflammatory changes of the colon are noted. The appendix is not visualized consistent with a prior surgical history. Small bowel and stomach are unremarkable. Vascular/Lymphatic: Aortic atherosclerosis. No enlarged abdominal or pelvic lymph nodes. Reproductive: Status post hysterectomy. No adnexal masses. Other: No abdominal wall hernia or abnormality. No abdominopelvic ascites. Musculoskeletal: No acute or significant osseous findings. IMPRESSION: Stable proximal left ureteral stone similar to that seen on the prior exam. Mild hydronephrosis is noted. No new focal abnormality is seen. Electronically Signed   By: Oneil Devonshire M.D.   On: 06/18/2024 02:54   CT Renal Stone Study Result Date: 06/15/2024 CLINICAL DATA:  Acute left flank pain. EXAM: CT ABDOMEN AND PELVIS WITHOUT CONTRAST TECHNIQUE: Multidetector CT imaging of the abdomen and pelvis was performed following the standard protocol without IV contrast. RADIATION DOSE REDUCTION: This exam was performed according  to the departmental dose-optimization program which includes automated exposure control, adjustment of the mA and/or kV according to patient size and/or use of iterative reconstruction technique. COMPARISON:  March 04, 2021. FINDINGS: Lower chest: No acute abnormality. Hepatobiliary: No focal liver abnormality is seen. Status post cholecystectomy. No biliary dilatation. Pancreas: Unremarkable. No pancreatic ductal dilatation or surrounding inflammatory changes. Spleen: Normal in size without focal abnormality. Adrenals/Urinary Tract: Adrenal glands appear normal. Mild left hydronephrosis is noted secondary to 7 mm calculus in proximal left ureter just beyond ureteropelvic junction. Urinary bladder is unremarkable. Right kidney and ureter are unremarkable. Stomach/Bowel: Stomach is unremarkable. There is no evidence of bowel obstruction or inflammation. Vascular/Lymphatic: Aortic atherosclerosis. No enlarged abdominal or pelvic lymph nodes. Reproductive: Status post hysterectomy. No adnexal masses. Other: No ascites or hernia. Musculoskeletal: No acute or significant osseous findings. IMPRESSION: Mild left hydronephrosis is noted secondary to 7 mm calculus in proximal left ureter just beyond ureteropelvic junction. Aortic Atherosclerosis (ICD10-I70.0). Electronically Signed   By: Lynwood Landy Raddle M.D.   On: 06/15/2024 17:24      Subjective: Patient seen and examined at bedside today.  Very comfortable.  Bilateral lower extremity edema almost resolved.  She feels anxious to go to SNF.  Had a long discussion at bedside that she is medically stable for discharge.  Nephrology cleared her for discharge today.  Discharge Exam: Vitals:   06/27/24 1948 06/28/24 0450  BP: (!) 139/51 (!) 152/54  Pulse: 70 71  Resp: 19 19  Temp: 98.1 F (36.7 C) 98 F (36.7 C)  SpO2: 95% 96%   Vitals:   06/27/24 1120 06/27/24 1948 06/28/24 0449 06/28/24 0450  BP: (!) 154/64 (!) 139/51  (!) 152/54  Pulse: 72 70  71  Resp:   19  19  Temp: 97.9 F (36.6 C) 98.1 F (36.7 C)  98 F (36.7 C)  TempSrc: Oral Oral  Oral  SpO2: 97% 95%  96%  Weight:   107.7 kg   Height:        General: Pt is alert, awake, not in acute distress Cardiovascular: RRR, S1/S2 +, no rubs, no gallops Respiratory: CTA bilaterally, no wheezing, no rhonchi Abdominal: Soft, NT, ND, bowel sounds + Extremities: no edema, no cyanosis GU: Foley    The results of significant diagnostics from this hospitalization (including imaging, microbiology, ancillary and laboratory) are listed below for reference.     Microbiology: No results found for this or any previous visit (from the past 240 hours).   Labs: BNP (last 3 results) No results for input(s): BNP in the last 8760 hours. Basic Metabolic Panel: Recent Labs  Lab 06/25/24 0506 06/25/24 1827 06/26/24 0604 06/27/24 0539 06/28/24 0607  NA 137 136 138 137 138  K 3.6 3.9 3.6 3.4* 3.1*  CL 106 104 105 104 100  CO2 16* 17* 16* 17* 24  GLUCOSE 86 96 110* 136* 146*  BUN 81* 84* 88* 80* 78*  CREATININE 7.17* 7.33* 7.04* 6.30* 5.23*  CALCIUM  8.0* 8.0* 8.1* 7.9* 8.0*  PHOS  --   --  6.6* 5.9* 5.2*   Liver Function Tests: Recent Labs  Lab 06/26/24 0604 06/27/24 0539 06/28/24 0607  ALBUMIN 2.0* 2.1* 2.2*   No results for input(s): LIPASE, AMYLASE in the last 168 hours. No results for input(s): AMMONIA in the last 168 hours. CBC: Recent Labs  Lab 06/23/24 1159  WBC 12.1*  NEUTROABS 8.7*  HGB 11.5*  HCT 36.4  MCV 92.9  PLT 193   Cardiac Enzymes: No results for input(s): CKTOTAL, CKMB, CKMBINDEX, TROPONINI in the last 168 hours. BNP: Invalid input(s): POCBNP CBG: Recent Labs  Lab 06/27/24 0733 06/27/24 1118 06/27/24 1626 06/27/24 2045 06/28/24 0728  GLUCAP 133* 127* 168* 161* 149*   D-Dimer No results for input(s): DDIMER in the last 72 hours. Hgb A1c No results for input(s): HGBA1C in the last 72 hours. Lipid Profile No results for  input(s): CHOL, HDL, LDLCALC, TRIG, CHOLHDL, LDLDIRECT in the last 72 hours. Thyroid  function studies No results for input(s): TSH, T4TOTAL, T3FREE, THYROIDAB in the last 72 hours.  Invalid input(s): FREET3 Anemia work up No results for input(s): VITAMINB12, FOLATE, FERRITIN, TIBC, IRON, RETICCTPCT in the last 72 hours. Urinalysis    Component Value Date/Time   COLORURINE YELLOW 06/21/2024 1205   APPEARANCEUR CLOUDY (A) 06/21/2024 1205   APPEARANCEUR Hazy 06/18/2013 0846   LABSPEC 1.005 06/21/2024 1205   LABSPEC 1.014 06/18/2013 0846   PHURINE 5.0 06/21/2024 1205   GLUCOSEU NEGATIVE 06/21/2024 1205   GLUCOSEU Negative 06/18/2013 0846   HGBUR LARGE (A) 06/21/2024 1205   BILIRUBINUR NEGATIVE 06/21/2024 1205   BILIRUBINUR Negative 06/18/2013 0846   KETONESUR NEGATIVE 06/21/2024 1205   PROTEINUR 100 (A) 06/21/2024 1205   UROBILINOGEN 0.2 07/14/2011 1416   NITRITE NEGATIVE 06/21/2024 1205   LEUKOCYTESUR LARGE (A) 06/21/2024 1205   LEUKOCYTESUR Negative 06/18/2013 0846   Sepsis Labs Recent Labs  Lab 06/23/24 1159  WBC 12.1*   Microbiology No results found for this or any previous visit (from the past 240 hours).  Please note: You were cared for by a hospitalist during your hospital stay. Once you are discharged, your primary care physician will handle any further medical issues. Please note that NO REFILLS for any discharge medications will be authorized once you are discharged, as it is imperative that you return to your primary care physician (or establish a relationship with a primary care physician if you do not have one) for your post hospital discharge needs so that they can reassess your need for medications and monitor your lab values.    Time coordinating discharge: 40 minutes  SIGNED:   Ivonne Mustache, MD  [facility] 06/28/2024, 11:03 AM Pager [phone]  If 7PM-7AM, please contact night-coverage [email or web address] Password  [email or web address]

## 2024-06-28 NOTE — Progress Notes (Signed)
 [facility] Progress Note  Subjective:  UOP 4 L yesterday, 1.5 L today Creat down to 5.2 Pt stable, no new c/o's   Vitals:   06/27/24 1120 06/27/24 1948 06/28/24 0449 06/28/24 0450  BP: (!) 154/64 (!) 139/51  (!) 152/54  Pulse: 72 70  71  Resp:  19  19  Temp: 97.9 F (36.6 C) 98.1 F (36.7 C)  98 F (36.7 C)  TempSrc: Oral Oral  Oral  SpO2: 97% 95%  96%  Weight:   107.7 kg   Height:        Exam: Gen: NAD CVS: RRR Resp:CTA Abd: +BS, soft, NT/ND Ext: no LE edema, resolved  Home bp meds:  Norvasc  2.5 every day Furosemide  80mg  every day Olmesartan  40mg  every day Others: asa, zoloft , mounjaro, crestor , PPI, T4, insulin , wellbutrin  and flomax     Assessment/ Plan: AKI - multifactorial with ischemic ATN in setting of urosepsis, volume depletion, and concomitant ARB, as well as left sided hydronephrosis.  Despite stent placement, creat continued to climb, however her blood pressure was also low and required stopping several agents. F/u renal US  on 8/14 showed no hydronephrosis post stent placement on 8/11.  Eventually pt required diuresis for vol overload (decomp CHF most likely) causing further worsening of renal function. Creatinine coming down now after a large diuresis, creat 6.3 yesterday and 5.2 today. No further suggestions, will dc IV lasix  and resume her home po lasix  at 80mg  daily. Our office will contact her to schedule renal f/u in 3 wks. Will sign off.  E. Coli urosepsis - getting IV ancef  Urolithiasis with hydronephrosis of left kidney - s/p cystoscopy and left ureteral stent placement on 06/18/24.  F/u US  8/14 was neg for obstruction.  Volume overload: resolved HTN - bp's were low and home meds are on hold. Current bp's 140/70 range, stable.   Myer Fret MD  [facility] 06/28/2024, 10:54 AM  Recent Labs  Lab 06/23/24 1159 06/23/24 1809 06/27/24 0539 06/28/24 0607  HGB 11.5*  --   --   --   ALBUMIN  --    < > 2.1* 2.2*  CALCIUM   --    < > 7.9* 8.0*   PHOS  --    < > 5.9* 5.2*  CREATININE  --    < > 6.30* 5.23*  K  --    < > 3.4* 3.1*   < > = values in this interval not displayed.   No results for input(s): IRON, TIBC, FERRITIN in the last 168 hours. Inpatient medications:  amLODipine   5 mg Oral Daily   aspirin  EC  81 mg Oral Daily   buPROPion   150 mg Oral q morning   Chlorhexidine  Gluconate Cloth  6 each Topical Daily   furosemide   80 mg Intravenous Q12H   gabapentin   100 mg Oral QHS   insulin  aspart  0-20 Units Subcutaneous TID WC   insulin  aspart  0-5 Units Subcutaneous QHS   levothyroxine   75 mcg Oral Q0600   pantoprazole   40 mg Oral Daily   potassium chloride   40 mEq Oral Daily   rosuvastatin   10 mg Oral Q breakfast   sertraline   100 mg Oral Daily   tamsulosin   0.4 mg Oral QPC supper     mouth rinse, oxyCODONE , polyethylene glycol, prochlorperazine 

## 2024-06-29 DIAGNOSIS — E039 Hypothyroidism, unspecified: Secondary | ICD-10-CM | POA: Diagnosis not present

## 2024-06-29 DIAGNOSIS — F329 Major depressive disorder, single episode, unspecified: Secondary | ICD-10-CM | POA: Diagnosis not present

## 2024-06-29 DIAGNOSIS — R5381 Other malaise: Secondary | ICD-10-CM | POA: Diagnosis not present

## 2024-06-29 DIAGNOSIS — N200 Calculus of kidney: Secondary | ICD-10-CM | POA: Diagnosis not present

## 2024-07-02 DIAGNOSIS — E039 Hypothyroidism, unspecified: Secondary | ICD-10-CM | POA: Diagnosis not present

## 2024-07-02 DIAGNOSIS — R5381 Other malaise: Secondary | ICD-10-CM | POA: Diagnosis not present

## 2024-07-02 DIAGNOSIS — M6281 Muscle weakness (generalized): Secondary | ICD-10-CM | POA: Diagnosis not present

## 2024-07-02 DIAGNOSIS — F329 Major depressive disorder, single episode, unspecified: Secondary | ICD-10-CM | POA: Diagnosis not present

## 2024-07-02 DIAGNOSIS — N138 Other obstructive and reflux uropathy: Secondary | ICD-10-CM | POA: Diagnosis not present

## 2024-07-02 DIAGNOSIS — F32A Depression, unspecified: Secondary | ICD-10-CM | POA: Diagnosis not present

## 2024-07-02 DIAGNOSIS — N200 Calculus of kidney: Secondary | ICD-10-CM | POA: Diagnosis not present

## 2024-07-02 DIAGNOSIS — A419 Sepsis, unspecified organism: Secondary | ICD-10-CM | POA: Diagnosis not present

## 2024-07-03 DIAGNOSIS — N200 Calculus of kidney: Secondary | ICD-10-CM | POA: Diagnosis not present

## 2024-07-03 DIAGNOSIS — R5381 Other malaise: Secondary | ICD-10-CM | POA: Diagnosis not present

## 2024-07-03 DIAGNOSIS — F329 Major depressive disorder, single episode, unspecified: Secondary | ICD-10-CM | POA: Diagnosis not present

## 2024-07-03 DIAGNOSIS — E039 Hypothyroidism, unspecified: Secondary | ICD-10-CM | POA: Diagnosis not present

## 2024-07-04 DIAGNOSIS — E039 Hypothyroidism, unspecified: Secondary | ICD-10-CM | POA: Diagnosis not present

## 2024-07-04 DIAGNOSIS — N200 Calculus of kidney: Secondary | ICD-10-CM | POA: Diagnosis not present

## 2024-07-04 DIAGNOSIS — R5381 Other malaise: Secondary | ICD-10-CM | POA: Diagnosis not present

## 2024-07-04 DIAGNOSIS — F329 Major depressive disorder, single episode, unspecified: Secondary | ICD-10-CM | POA: Diagnosis not present

## 2024-07-05 DIAGNOSIS — R5381 Other malaise: Secondary | ICD-10-CM | POA: Diagnosis not present

## 2024-07-05 DIAGNOSIS — N200 Calculus of kidney: Secondary | ICD-10-CM | POA: Diagnosis not present

## 2024-07-05 DIAGNOSIS — E039 Hypothyroidism, unspecified: Secondary | ICD-10-CM | POA: Diagnosis not present

## 2024-07-05 DIAGNOSIS — F329 Major depressive disorder, single episode, unspecified: Secondary | ICD-10-CM | POA: Diagnosis not present

## 2024-07-09 DIAGNOSIS — F329 Major depressive disorder, single episode, unspecified: Secondary | ICD-10-CM | POA: Diagnosis not present

## 2024-07-09 DIAGNOSIS — E039 Hypothyroidism, unspecified: Secondary | ICD-10-CM | POA: Diagnosis not present

## 2024-07-09 DIAGNOSIS — N200 Calculus of kidney: Secondary | ICD-10-CM | POA: Diagnosis not present

## 2024-07-09 DIAGNOSIS — E119 Type 2 diabetes mellitus without complications: Secondary | ICD-10-CM | POA: Diagnosis not present

## 2024-07-09 DIAGNOSIS — R5381 Other malaise: Secondary | ICD-10-CM | POA: Diagnosis not present

## 2024-07-10 ENCOUNTER — Other Ambulatory Visit: Payer: Self-pay | Admitting: Urology

## 2024-07-11 DIAGNOSIS — E039 Hypothyroidism, unspecified: Secondary | ICD-10-CM | POA: Diagnosis not present

## 2024-07-11 DIAGNOSIS — I10 Essential (primary) hypertension: Secondary | ICD-10-CM | POA: Diagnosis not present

## 2024-07-11 DIAGNOSIS — L89609 Pressure ulcer of unspecified heel, unspecified stage: Secondary | ICD-10-CM | POA: Diagnosis not present

## 2024-07-11 DIAGNOSIS — R5381 Other malaise: Secondary | ICD-10-CM | POA: Diagnosis not present

## 2024-07-12 DIAGNOSIS — F32A Depression, unspecified: Secondary | ICD-10-CM | POA: Diagnosis not present

## 2024-07-12 DIAGNOSIS — N138 Other obstructive and reflux uropathy: Secondary | ICD-10-CM | POA: Diagnosis not present

## 2024-07-12 DIAGNOSIS — M6281 Muscle weakness (generalized): Secondary | ICD-10-CM | POA: Diagnosis not present

## 2024-07-12 DIAGNOSIS — A419 Sepsis, unspecified organism: Secondary | ICD-10-CM | POA: Diagnosis not present

## 2024-07-13 DIAGNOSIS — R2689 Other abnormalities of gait and mobility: Secondary | ICD-10-CM | POA: Diagnosis not present

## 2024-07-13 DIAGNOSIS — A419 Sepsis, unspecified organism: Secondary | ICD-10-CM | POA: Diagnosis not present

## 2024-07-13 DIAGNOSIS — M6281 Muscle weakness (generalized): Secondary | ICD-10-CM | POA: Diagnosis not present

## 2024-07-13 DIAGNOSIS — N170 Acute kidney failure with tubular necrosis: Secondary | ICD-10-CM | POA: Diagnosis not present

## 2024-07-13 DIAGNOSIS — R2681 Unsteadiness on feet: Secondary | ICD-10-CM | POA: Diagnosis not present

## 2024-07-13 DIAGNOSIS — N201 Calculus of ureter: Secondary | ICD-10-CM | POA: Diagnosis not present

## 2024-07-16 ENCOUNTER — Other Ambulatory Visit: Payer: Self-pay | Admitting: Cardiovascular Disease

## 2024-07-16 DIAGNOSIS — Z8619 Personal history of other infectious and parasitic diseases: Secondary | ICD-10-CM | POA: Diagnosis not present

## 2024-07-16 DIAGNOSIS — E1165 Type 2 diabetes mellitus with hyperglycemia: Secondary | ICD-10-CM | POA: Diagnosis not present

## 2024-07-16 DIAGNOSIS — Z960 Presence of urogenital implants: Secondary | ICD-10-CM | POA: Diagnosis not present

## 2024-07-16 DIAGNOSIS — I10 Essential (primary) hypertension: Secondary | ICD-10-CM

## 2024-07-16 DIAGNOSIS — N201 Calculus of ureter: Secondary | ICD-10-CM | POA: Diagnosis not present

## 2024-07-16 DIAGNOSIS — I272 Pulmonary hypertension, unspecified: Secondary | ICD-10-CM | POA: Diagnosis not present

## 2024-07-18 DIAGNOSIS — N209 Urinary calculus, unspecified: Secondary | ICD-10-CM | POA: Diagnosis not present

## 2024-07-18 DIAGNOSIS — I129 Hypertensive chronic kidney disease with stage 1 through stage 4 chronic kidney disease, or unspecified chronic kidney disease: Secondary | ICD-10-CM | POA: Diagnosis not present

## 2024-07-18 DIAGNOSIS — N179 Acute kidney failure, unspecified: Secondary | ICD-10-CM | POA: Diagnosis not present

## 2024-07-23 ENCOUNTER — Ambulatory Visit (INDEPENDENT_AMBULATORY_CARE_PROVIDER_SITE_OTHER): Admitting: Podiatry

## 2024-07-23 ENCOUNTER — Encounter: Payer: Self-pay | Admitting: Podiatry

## 2024-07-23 ENCOUNTER — Ambulatory Visit (INDEPENDENT_AMBULATORY_CARE_PROVIDER_SITE_OTHER)

## 2024-07-23 DIAGNOSIS — E11621 Type 2 diabetes mellitus with foot ulcer: Secondary | ICD-10-CM

## 2024-07-23 DIAGNOSIS — L97411 Non-pressure chronic ulcer of right heel and midfoot limited to breakdown of skin: Secondary | ICD-10-CM

## 2024-07-23 DIAGNOSIS — S9031XA Contusion of right foot, initial encounter: Secondary | ICD-10-CM

## 2024-07-23 NOTE — Progress Notes (Unsigned)
 Chief Complaint  Patient presents with   Foot Ulcer    Right foot posterior heel ulcer. 0 pain. Used an antiseptic to keep clean. Wearing heel pads to offload pressure. IDDM A1C 6.8.   HPI: 73 y.o. female presenting today with concern of a wound on the back of the right heel.  Patient was recently hospitalized and then discharged to a skilled nursing facility for a total of at least 4 weeks and apparently developed the wound while she was hospitalized.  States no treatment has been performed.  She does have pain to the area.  Family member is with her today.  Past Medical History:  Diagnosis Date   Anemia    hx of   Anginal pain (HCC)    hx of   Anxiety    Depression    Diabetes mellitus without complication (HCC)    GERD (gastroesophageal reflux disease)    Headache(784.0)    Heart murmur    Hyperlipidemia    Hypertension    MRSA (methicillin resistant staph aureus) culture positive    many years ago-abdominal wound- no issuses since.   PONV (postoperative nausea and vomiting)    severe   Sleep apnea    Study done -remains under evaluation- no cpap yet.   Vitamin D deficiency    Past Surgical History:  Procedure Laterality Date   ABDOMINAL HYSTERECTOMY  1970's   APPENDECTOMY  1970's   BREAST BIOPSY Left 2018   CARDIAC CATHETERIZATION     CHOLECYSTECTOMY N/A 10/02/2013   Procedure: LAPAROSCOPIC CHOLECYSTECTOMY;  Surgeon: Krystal CHRISTELLA Spinner, MD;  Location: [facility];  Service: General;  Laterality: N/A;   cyst removed Left    wristganglion   CYSTOSCOPY WITH STENT PLACEMENT Left 06/18/2024   Procedure: CYSTOSCOPY, WITH STENT INSERTION;  Surgeon: Renda Glance, MD;  Location: [facility];  Service: Urology;  Laterality: Left;   ELBOW SURGERY Left    HARDWARE REMOVAL Left    ankle   KNEE ARTHROSCOPY Right 06/30/2016   Procedure: ARTHROSCOPY RIGHT KNEE WITH MEDIAL AND LATERAL MENSICAL DEBRIDEMENT;  Surgeon: Dempsey Moan, MD;  Location: [facility];  Service: Orthopedics;  Laterality:  Right;  LMA   LEG SURGERY Left 1980's   broke leg and ankle   SHOULDER ARTHROSCOPY Bilateral    TONSILLECTOMY  1970's   TUBAL LIGATION  1970's   Allergies  Allergen Reactions   Actos [Pioglitazone] Other (See Comments)    Unknown reaction   Codeine Nausea And Vomiting   Ms Contin  [Morphine ] Other (See Comments)    Altered mental state Somnolence   Neurontin  [Gabapentin ] Other (See Comments)    Confusion Altered mental state   Ozempic (0.25 Or 0.5 Mg-Dose) [Semaglutide(0.25 Or 0.5mg -Dos)] Nausea And Vomiting   Tape Rash     PHYSICAL EXAM: General: The patient is alert and oriented x3 in no acute distress.  Dermatology: Skin is warm, dry and supple bilateral lower extremities. Interspaces are clear of maceration and debris.      Wound 1:  Location: Posterior right heel        Depth: Superficial/partial-thickness/hematoma        Wound Border: Clear        Wound Base: Granular/bloody        Drainage: Blood       Odor?:  None        Surrounding Tissue: No erythema or edema        Infected?:  No        Necrosis?:  No  Pain?:  Yes        Tunneling: None       Dimensions (cm): 2.0 x 1.8 x 0.1 cm  Vascular: Pedal pulses are palpable     Latest Ref Rng & Units 06/18/2024    8:14 AM  Hemoglobin A1C  Hemoglobin-A1c 4.8 - 5.6 % 8.0    RADIOGRAPHIC EXAM:  Normal osseous mineralization.  No evidence of osteolysis to the calcaneus in the area of the posterior heel ulceration.  ASSESSMENT / PLAN OF CARE: 1. Ulcer of right heel and midfoot, limited to breakdown of skin (HCC)   2. Type 2 diabetes mellitus with foot ulcer (CODE) (HCC)   3. Hematoma of right foot    The ulceration/hematoma was sharply debrided of devitalized soft tissue, and drained of gelatinous blood, with sterile #312 blade to the level of dermis.  Hemostasis obtained.  Betadine ointment and DSD applied.  Reviewed off-loading with patient.  Surgical shoe dispensed for patient to wear at all times WB.   Once patient is home, she was instructed to avoid any closed heel shoes.  Recommended a foam heel lift boot for the family member to purchase off of [employer] to completely offload the posterior heel.  Wear this when sleeping and when resting.  Need to keep all pressure off the posterior heel.  Will do a prism  order to have wound care supplies sent to the patient's residence.  Reviewed daily dressing changes with patient.  Betadine wet-to-dry recommended once daily.  Discussed risks / concerns regarding ulcer with patient and possible sequelae if left untreated.  Stressed importance of infection prevention at home. Short-term goals are: prevent infection, off-load ulcer, heal ulcer Long-term goals are:  prevent recurrence, prevent amputation.   Return in about 2 weeks (around 08/06/2024) for Recheck right posterior heel wound and RFC (30 minutes).   Awanda CHARM Imperial, DPM, FACFAS [facility]     [address] [place], [place] [address]                Office [phone]  Fax [phone]

## 2024-08-06 ENCOUNTER — Ambulatory Visit: Admitting: Podiatry

## 2024-08-06 ENCOUNTER — Encounter: Payer: Self-pay | Admitting: Podiatry

## 2024-08-06 DIAGNOSIS — E11621 Type 2 diabetes mellitus with foot ulcer: Secondary | ICD-10-CM | POA: Diagnosis not present

## 2024-08-06 DIAGNOSIS — M79674 Pain in right toe(s): Secondary | ICD-10-CM

## 2024-08-06 DIAGNOSIS — M79675 Pain in left toe(s): Secondary | ICD-10-CM

## 2024-08-06 DIAGNOSIS — I509 Heart failure, unspecified: Secondary | ICD-10-CM | POA: Diagnosis not present

## 2024-08-06 DIAGNOSIS — B351 Tinea unguium: Secondary | ICD-10-CM

## 2024-08-06 DIAGNOSIS — E1165 Type 2 diabetes mellitus with hyperglycemia: Secondary | ICD-10-CM | POA: Diagnosis not present

## 2024-08-06 NOTE — Progress Notes (Signed)
 Anesthesia Review:  PCP: Cardiologist :  PPM/ ICD: Device Orders: Rep Notified:  Chest x-ray : 06/18/24- 1 view  EKG : 06/18/24  Echo : 08/29/23  Stress test: 2018  Card 08/12/23  Cardiac Cath :   Activity level:  Sleep Study/ CPAP : Fasting Blood Sugar :      / Checks Blood Sugar -- times a day:    DM- type  Hgba1c-  06/18/24-8.0  Novolin 70/30 Mounjaro- last dose on   Blood Thinner/ Instructions /Last Dose: ASA / Instructions/ Last Dose :    81 mg aspirin    06/18/24- ED Admission with Sepsis

## 2024-08-06 NOTE — Patient Instructions (Signed)
 SURGICAL WAITING ROOM VISITATION  Patients having surgery or a procedure may have no more than 2 support people in the waiting area - these visitors may rotate.    Children under the age of 21 must have an adult with them who is not the patient.  Visitors with respiratory illnesses are discouraged from visiting and should remain at home.  If the patient needs to stay at the hospital during part of their recovery, the visitor guidelines for inpatient rooms apply. Pre-op nurse will coordinate an appropriate time for 1 support person to accompany patient in pre-op.  This support person may not rotate.    Please refer to the [facility] website for the visitor guidelines for Inpatients (after your surgery is over and you are in a regular room).       Your procedure is scheduled on:  08/20/24    Report to [facility] Main Entrance    Report to admitting at   115 pm    Call this number if you have problems the morning of surgery [phone]   Do not eat food :After Midnight.   After Midnight you may have the following liquids until __ 1215pm ____  DAY OF SURGERY  Water  Non-Citrus Juices (without pulp, NO RED-Apple, White grape, White cranberry) Black Coffee (NO MILK/CREAM OR CREAMERS, sugar ok)  Clear Tea (NO MILK/CREAM OR CREAMERS, sugar ok) regular and decaf                             Plain Jell-O (NO RED)                                           Fruit ices (not with fruit pulp, NO RED)                                     Popsicles (NO RED)                                                               Sports drinks like Gatorade (NO RED)                            If you have questions, please contact your surgeon's office.       Oral Hygiene is also important to reduce your risk of infection.                                    Remember - BRUSH YOUR TEETH THE MORNING OF SURGERY WITH YOUR REGULAR TOOTHPASTE  DENTURES WILL BE REMOVED PRIOR TO SURGERY PLEASE DO NOT  APPLY Poly grip OR ADHESIVES!!!   Do NOT smoke after Midnight   Stop all vitamins and herbal supplements 7 days before surgery.   Take these medicines the morning of surgery with A SIP OF WATER :  amlodipine , wellbutrin , ciro, synthroid , omeprazole  Novolin 70/30-               Mounjaro- Last dose on   DO NOT TAKE ANY ORAL DIABETIC MEDICATIONS DAY OF YOUR SURGERY  Bring CPAP mask and tubing day of surgery.                              You may not have any metal on your body including hair pins, jewelry, and body piercing             Do not wear make-up, lotions, powders, perfumes/cologne, or deodorant  Do not wear nail polish including gel and S&S, artificial/acrylic nails, or any other type of covering on natural nails including finger and toenails. If you have artificial nails, gel coating, etc. that needs to be removed by a nail salon please have this removed prior to surgery or surgery may need to be canceled/ delayed if the surgeon/ anesthesia feels like they are unable to be safely monitored.   Do not shave  48 hours prior to surgery.               Men may shave face and neck.   Do not bring valuables to the hospital. [facility] IS NOT             RESPONSIBLE   FOR VALUABLES.   Contacts, glasses, dentures or bridgework may not be worn into surgery.   Bring small overnight bag day of surgery.   DO NOT BRING YOUR HOME MEDICATIONS TO THE HOSPITAL. PHARMACY WILL DISPENSE MEDICATIONS LISTED ON YOUR MEDICATION LIST TO YOU DURING YOUR ADMISSION IN THE HOSPITAL!    Patients discharged on the day of surgery will not be allowed to drive home.  Someone NEEDS to stay with you for the first 24 hours after anesthesia.   Special Instructions: Bring a copy of your healthcare power of attorney and living will documents the day of surgery if you haven't scanned them before.              Please read over the following fact sheets you were given: IF YOU HAVE QUESTIONS ABOUT  YOUR PRE-OP INSTRUCTIONS PLEASE CALL [phone].   If you received a COVID test during your pre-op visit  it is requested that you wear a mask when out in public, stay away from anyone that may not be feeling well and notify your surgeon if you develop symptoms. If you test positive for Covid or have been in contact with anyone that has tested positive in the last 10 days please notify you surgeon.    [facility] - Preparing for Surgery Before surgery, you can play an important role.  Because skin is not sterile, your skin needs to be as free of germs as possible.  You can reduce the number of germs on your skin by washing with CHG (chlorahexidine gluconate) soap before surgery.  CHG is an antiseptic cleaner which kills germs and bonds with the skin to continue killing germs even after washing. Please DO NOT use if you have an allergy to CHG or antibacterial soaps.  If your skin becomes reddened/irritated stop using the CHG and inform your nurse when you arrive at Short Stay. Do not shave (including legs and underarms) for at least 48 hours prior to the first CHG shower.  You may shave your face/neck. Please follow these instructions carefully:  1.  Shower with CHG Soap the night before surgery  and the  morning of Surgery.  2.  If you choose to wash your hair, wash your hair first as usual with your  normal  shampoo.  3.  After you shampoo, rinse your hair and body thoroughly to remove the  shampoo.                           4.  Use CHG as you would any other liquid soap.  You can apply chg directly  to the skin and wash                       Gently with a scrungie or clean washcloth.  5.  Apply the CHG Soap to your body ONLY FROM THE NECK DOWN.   Do not use on face/ open                           Wound or open sores. Avoid contact with eyes, ears mouth and genitals (private parts).                       Wash face,  Genitals (private parts) with your normal soap.             6.  Wash thoroughly,  paying special attention to the area where your surgery  will be performed.  7.  Thoroughly rinse your body with warm water  from the neck down.  8.  DO NOT shower/wash with your normal soap after using and rinsing off  the CHG Soap.                9.  Pat yourself dry with a clean towel.            10.  Wear clean pajamas.            11.  Place clean sheets on your bed the night of your first shower and do not  sleep with pets. Day of Surgery : Do not apply any lotions/deodorants the morning of surgery.  Please wear clean clothes to the hospital/surgery center.  FAILURE TO FOLLOW THESE INSTRUCTIONS MAY RESULT IN THE CANCELLATION OF YOUR SURGERY PATIENT SIGNATURE_________________________________  NURSE SIGNATURE__________________________________  ________________________________________________________________________

## 2024-08-06 NOTE — Progress Notes (Unsigned)
 Subjective:  Patient ID: Sue Davidson, female    DOB: [birth year],  MRN: [id number]  Sally Menard presents to clinic today for:  Chief Complaint  Patient presents with   Wound Check    Recheck right posterior heel wound and RFC. IDDM A1C 8.0. Iodine dressing and epsom salt soaks.   Patient notes nails are thick, discolored, elongated and painful in shoegear when trying to ambulate.  She is also here for recheck on the right posterior heel ulcer.  States that there has been no drainage.  They have been putting a dry dressing on the area daily.  They were using iodine previously.  She states that she is having kidney stone surgery in approximately 2 weeks.  PCP is Clarice Nottingham, MD.  Past Medical History:  Diagnosis Date   Anemia    hx of   Anginal pain    hx of   Anxiety    Arthritis    Chronic kidney disease    Depression    Diabetes mellitus without complication (HCC)    GERD (gastroesophageal reflux disease)    Headache(784.0)    Heart murmur    History of kidney stones    Hyperlipidemia    Hypertension    Hypothyroidism    MRSA (methicillin resistant staph aureus) culture positive    many years ago-abdominal wound- no issuses since.   Peripheral vascular disease    PONV (postoperative nausea and vomiting)    severe   Sleep apnea    Study done -remains under evaluation- no cpap yet.   Vitamin D deficiency    Past Surgical History:  Procedure Laterality Date   ABDOMINAL HYSTERECTOMY  1970's   APPENDECTOMY  1970's   BREAST BIOPSY Left 2018   CARDIAC CATHETERIZATION     CHOLECYSTECTOMY N/A 10/02/2013   Procedure: LAPAROSCOPIC CHOLECYSTECTOMY;  Surgeon: Krystal CHRISTELLA Spinner, MD;  Location: [facility];  Service: General;  Laterality: N/A;   cyst removed Left    wristganglion   CYSTOSCOPY WITH STENT PLACEMENT Left 06/18/2024   Procedure: CYSTOSCOPY, WITH STENT INSERTION;  Surgeon: Renda Glance, MD;  Location: [facility];  Service: Urology;  Laterality: Left;   ELBOW  SURGERY Left    HARDWARE REMOVAL Left    ankle   KNEE ARTHROSCOPY Right 06/30/2016   Procedure: ARTHROSCOPY RIGHT KNEE WITH MEDIAL AND LATERAL MENSICAL DEBRIDEMENT;  Surgeon: Dempsey Moan, MD;  Location: [facility];  Service: Orthopedics;  Laterality: Right;  LMA   LEG SURGERY Left 1980's   broke leg and ankle   SHOULDER ARTHROSCOPY Bilateral    TONSILLECTOMY  1970's   TUBAL LIGATION  1970's   Allergies  Allergen Reactions   Actos [Pioglitazone] Other (See Comments)    Unknown reaction   Codeine Nausea And Vomiting   Ms Contin  [Morphine ] Other (See Comments)    Altered mental state Somnolence   Neurontin  [Gabapentin ] Other (See Comments)    Confusion Altered mental state   Ozempic (0.25 Or 0.5 Mg-Dose) [Semaglutide(0.25 Or 0.5mg -Dos)] Nausea And Vomiting   Tape Rash    Review of Systems: Negative except as noted in the HPI.  Objective:  Sue Davidson is a pleasant 73 y.o. female in NAD. AAO x 3.  Vascular Examination: Capillary refill time is 3-5 seconds to toes bilateral. Palpable pedal pulses b/l LE. Digital hair present b/l.  Skin temperature gradient WNL b/l. No varicosities b/l. No cyanosis noted b/l.   Dermatological Examination: Pedal skin with normal turgor, texture and  tone b/l. No open wounds. No interdigital macerations b/l. Toenails x10 are 3mm thick, discolored, dystrophic with subungual debris. There is pain with compression of the nail plates.  They are elongated x10.  The previous ulceration on the posterior aspect of the heel is dry and stable.  There is hard skin present in the area where the hematoma had dried but did not completely resorb.  There is intact skin of the knee for this.  No surrounding erythema or edema or calor.     Latest Ref Rng & Units 08/07/2024   11:52 AM 06/18/2024    8:14 AM  Hemoglobin A1C  Hemoglobin-A1c 4.8 - 5.6 % 8.4  8.0    Assessment/Plan: 1. Pain due to onychomycosis of toenails of both feet   2. Type 2 diabetes mellitus  with foot ulcer (CODE) (HCC)     Patient formed the ulceration appears dry and stable at this time.  There is hard skin present from where the hematoma had a lot of resorbed.  A sanding bur was utilized to smooth this out so that it does not catch on her socks and pull in a good tissue away.  She was instructed to keep a close eye on this area and keep the posterior heel offloaded is much as possible.  She has an upcoming surgery so this would be a concern if she has any weightbearing restrictions postop.  They will continue with iodine to the area with dry gauze dressing for at least 1 more week.  If everything is resolved at that time they can discontinue those instructions.  The mycotic toenails were sharply debrided x10 with sterile nail nippers and a power debriding burr to decrease bulk/thickness and length.    Return in about 2 weeks (around 08/20/2024) for recheck R heel ulcer .  And follow-up in 3 months for diabetic nail care   Darcy Cordner DSABRA Imperial, DPM, FACFAS [facility]     [address] [place], [place] [address]                Office [phone]  Fax [phone]

## 2024-08-07 ENCOUNTER — Encounter (HOSPITAL_COMMUNITY)
Admission: RE | Admit: 2024-08-07 | Discharge: 2024-08-07 | Disposition: A | Discharge status: Home / Self Care | Source: Ambulatory Visit | Attending: Urology | Admitting: Urology

## 2024-08-07 ENCOUNTER — Other Ambulatory Visit: Payer: Self-pay

## 2024-08-07 ENCOUNTER — Encounter (HOSPITAL_COMMUNITY): Payer: Self-pay

## 2024-08-07 VITALS — BP 166/59 | HR 67 | Temp 98.6°F | Resp 16 | Ht 65.0 in | Wt 206.0 lb

## 2024-08-07 DIAGNOSIS — Z01818 Encounter for other preprocedural examination: Secondary | ICD-10-CM

## 2024-08-07 DIAGNOSIS — Z01812 Encounter for preprocedural laboratory examination: Secondary | ICD-10-CM | POA: Diagnosis not present

## 2024-08-07 HISTORY — DX: Personal history of urinary calculi: Z87.442

## 2024-08-07 HISTORY — DX: Hypothyroidism, unspecified: E03.9

## 2024-08-07 HISTORY — DX: Unspecified osteoarthritis, unspecified site: M19.90

## 2024-08-07 HISTORY — DX: Peripheral vascular disease, unspecified: I73.9

## 2024-08-07 HISTORY — DX: Chronic kidney disease, unspecified: N18.9

## 2024-08-07 LAB — CBC
HCT: 27.8 % — ABNORMAL LOW (ref 36.0–46.0)
Hemoglobin: 8.6 g/dL — ABNORMAL LOW (ref 12.0–15.0)
MCH: 28.8 pg (ref 26.0–34.0)
MCHC: 30.9 g/dL (ref 30.0–36.0)
MCV: 93 fL (ref 80.0–100.0)
Platelets: 320 K/uL (ref 150–400)
RBC: 2.99 MIL/uL — ABNORMAL LOW (ref 3.87–5.11)
RDW: 15 % (ref 11.5–15.5)
WBC: 10.2 K/uL (ref 4.0–10.5)
nRBC: 0 % (ref 0.0–0.2)

## 2024-08-07 LAB — BASIC METABOLIC PANEL WITH GFR
Anion gap: 13 (ref 5–15)
BUN: 12 mg/dL (ref 8–23)
CO2: 26 mmol/L (ref 22–32)
Calcium: 9 mg/dL (ref 8.9–10.3)
Chloride: 100 mmol/L (ref 98–111)
Creatinine, Ser: 1.36 mg/dL — ABNORMAL HIGH (ref 0.44–1.00)
GFR, Estimated: 41 mL/min — ABNORMAL LOW
Glucose, Bld: 144 mg/dL — ABNORMAL HIGH (ref 70–99)
Potassium: 3.5 mmol/L (ref 3.5–5.1)
Sodium: 138 mmol/L (ref 135–145)

## 2024-08-07 LAB — GLUCOSE, CAPILLARY: Glucose-Capillary: 141 mg/dL — ABNORMAL HIGH (ref 70–99)

## 2024-08-07 LAB — HEMOGLOBIN A1C
Hgb A1c MFr Bld: 8.4 % — ABNORMAL HIGH (ref 4.8–5.6)
Mean Plasma Glucose: 194.38 mg/dL

## 2024-08-08 DIAGNOSIS — E119 Type 2 diabetes mellitus without complications: Secondary | ICD-10-CM | POA: Diagnosis not present

## 2024-08-13 NOTE — Progress Notes (Signed)
 Anesthesia Chart Review   Case: [id number] Date/Time: 08/20/24 1015   Procedure: CYSTOSCOPY/URETEROSCOPY/HOLMIUM LASER/STENT PLACEMENT (Left)   Anesthesia type: General   Diagnosis: Left ureteral calculus [N20.1]   Pre-op diagnosis: LEFT URETERAL CALCULUS   Location[facility]   Surgeons: Renda Glance, MD       DISCUSSION:73 y.o. never smoker with h/o PONV, HTN, sleep apnea, hypothyroidism, CKD, Dm II (A1C 8.4), left ureteral calculus scheduled for above procedure 08/20/2024 with Dr. Glance Renda.   Recent admission 8/11-8/21/2025 with severe sepsis, urology consulted. Pt underwent cystoscopy with stent placement 06/18/24, no anesthesia complications noted.   Pt reports Mounjaro has been on hold since 07/17/24.   Pt seen by cardiology 11/25/23 for evaluation of syncope.  Per notes it was a single event, no meaningful arrhythmia on event monitor, no structural abnormality, normal carotid ultrasound. 1 year follow up recommended. Per notes pt likely with uncontrolled OSA, has been referred for evaluation by Dr. Shlomo. This visit is not until November.   Hemoglobin 8.6, forwarded to PCP and Dr. Renda. Recheck DOS.  VS: BP (!) 166/59   Pulse 67   Temp 37 C (Oral)   Resp 16   Ht 5' 5 (1.651 m)   Wt 93.4 kg   SpO2 96%   BMI 34.28 kg/m   PROVIDERS: Clarice Nottingham, MD is PCP    LABS: Labs reviewed: Acceptable for surgery. (all labs ordered are listed, but only abnormal results are displayed)  Labs Reviewed  HEMOGLOBIN A1C - Abnormal; Notable for the following components:      Result Value   Hgb A1c MFr Bld 8.4 (*)    All other components within normal limits  BASIC METABOLIC PANEL WITH GFR - Abnormal; Notable for the following components:   Glucose, Bld 144 (*)    Creatinine, Ser 1.36 (*)    GFR, Estimated 41 (*)    All other components within normal limits  CBC - Abnormal; Notable for the following components:   RBC 2.99 (*)    Hemoglobin 8.6 (*)    HCT 27.8  (*)    All other components within normal limits  GLUCOSE, CAPILLARY - Abnormal; Notable for the following components:   Glucose-Capillary 141 (*)    All other components within normal limits     IMAGES:   EKG:   CV: Echo 08/10/23 1. Left ventricular ejection fraction, by estimation, is 60 to 65%. The  left ventricle has normal function. The left ventricle has no regional  wall motion abnormalities. There is mild left ventricular hypertrophy.  Left ventricular diastolic parameters  are consistent with Grade I diastolic dysfunction (impaired relaxation).   2. Right ventricular systolic function is normal. The right ventricular  size is normal. There is normal pulmonary artery systolic pressure. The  estimated right ventricular systolic pressure is 29.0 mmHg.   3. The mitral valve is degenerative. Trivial mitral valve regurgitation.  No evidence of mitral stenosis.   4. The aortic valve is tricuspid. There is mild calcification of the  aortic valve. Aortic valve regurgitation is not visualized. No aortic  stenosis is present.   5. The inferior vena cava is normal in size with greater than 50%  respiratory variability, suggesting right atrial pressure of 3 mmHg.   Myocardial Perfusion 05/13/2017 Probable normal perfusion No significant ischemia or scar. Nuclear stress EF: 74%. Low risk study     Past Medical History:  Diagnosis Date   Anemia    hx of  Anginal pain    hx of   Anxiety    Arthritis    Chronic kidney disease    Depression    Diabetes mellitus without complication (HCC)    GERD (gastroesophageal reflux disease)    Headache(784.0)    Heart murmur    History of kidney stones    Hyperlipidemia    Hypertension    Hypothyroidism    MRSA (methicillin resistant staph aureus) culture positive    many years ago-abdominal wound- no issuses since.   Peripheral vascular disease    PONV (postoperative nausea and vomiting)    severe   Sleep apnea    Study done  -remains under evaluation- no cpap yet.   Vitamin D deficiency     Past Surgical History:  Procedure Laterality Date   ABDOMINAL HYSTERECTOMY  1970's   APPENDECTOMY  1970's   BREAST BIOPSY Left 2018   CARDIAC CATHETERIZATION     CHOLECYSTECTOMY N/A 10/02/2013   Procedure: LAPAROSCOPIC CHOLECYSTECTOMY;  Surgeon: Krystal CHRISTELLA Spinner, MD;  Location: [facility];  Service: General;  Laterality: N/A;   cyst removed Left    wristganglion   CYSTOSCOPY WITH STENT PLACEMENT Left 06/18/2024   Procedure: CYSTOSCOPY, WITH STENT INSERTION;  Surgeon: Renda Glance, MD;  Location: [facility];  Service: Urology;  Laterality: Left;   ELBOW SURGERY Left    HARDWARE REMOVAL Left    ankle   KNEE ARTHROSCOPY Right 06/30/2016   Procedure: ARTHROSCOPY RIGHT KNEE WITH MEDIAL AND LATERAL MENSICAL DEBRIDEMENT;  Surgeon: Dempsey Moan, MD;  Location: [facility];  Service: Orthopedics;  Laterality: Right;  LMA   LEG SURGERY Left 1980's   broke leg and ankle   SHOULDER ARTHROSCOPY Bilateral    TONSILLECTOMY  1970's   TUBAL LIGATION  1970's    MEDICATIONS:  acetaminophen  (TYLENOL ) 650 MG CR tablet   amLODipine  (NORVASC ) 2.5 MG tablet   aspirin  EC 81 MG tablet   buPROPion  (WELLBUTRIN  XL) 150 MG 24 hr tablet   Cholecalciferol (VITAMIN D-3 PO)   ciprofloxacin (CIPRO) 250 MG tablet   furosemide  (LASIX ) 80 MG tablet   insulin  NPH-regular Human (NOVOLIN 70/30) (70-30) 100 UNIT/ML injection   levothyroxine  (SYNTHROID ) 75 MCG tablet   olmesartan  (BENICAR ) 40 MG tablet   omeprazole (PRILOSEC) 40 MG capsule   ondansetron  (ZOFRAN ) 8 MG tablet   rosuvastatin  (CRESTOR ) 40 MG tablet   sertraline  (ZOLOFT ) 100 MG tablet   tamsulosin  (FLOMAX ) 0.4 MG CAPS capsule   tirzepatide (MOUNJARO) 5 MG/0.5ML Pen   No current facility-administered medications for this encounter.     Harlene Hoots Ward, PA-C [facility] [phone]

## 2024-08-14 DIAGNOSIS — E039 Hypothyroidism, unspecified: Secondary | ICD-10-CM | POA: Diagnosis not present

## 2024-08-14 DIAGNOSIS — D649 Anemia, unspecified: Secondary | ICD-10-CM | POA: Diagnosis not present

## 2024-08-18 NOTE — Anesthesia Preprocedure Evaluation (Signed)
 Anesthesia Evaluation  Patient identified by MRN, date of birth, ID band Patient awake    Reviewed: Allergy & Precautions, NPO status , Patient's Chart, lab work & pertinent test results  History of Anesthesia Complications (+) PONV and history of anesthetic complications  Airway Mallampati: II  TM Distance: >3 FB Neck ROM: Full   Comment: Previous grade I view with Miller 2, easy mask with OPA Dental  (+) Edentulous Upper, Edentulous Lower   Pulmonary neg shortness of breath, sleep apnea (does not use CPAP) , neg COPD, neg recent URI   Pulmonary exam normal breath sounds clear to auscultation       Cardiovascular hypertension (amlodipine , olmesartan ), Pt. on medications pulmonary hypertension(-) angina + Peripheral Vascular Disease and +CHF  (-) Past MI, (-) Cardiac Stents and (-) CABG + Valvular Problems/Murmurs  Rhythm:Regular Rate:Normal  HLD, bilateral carotid disease  TTE 08/10/2023: IMPRESSIONS    1. Left ventricular ejection fraction, by estimation, is 60 to 65%. The  left ventricle has normal function. The left ventricle has no regional  wall motion abnormalities. There is mild left ventricular hypertrophy.  Left ventricular diastolic parameters  are consistent with Grade I diastolic dysfunction (impaired relaxation).   2. Right ventricular systolic function is normal. The right ventricular  size is normal. There is normal pulmonary artery systolic pressure. The  estimated right ventricular systolic pressure is 29.0 mmHg.   3. The mitral valve is degenerative. Trivial mitral valve regurgitation.  No evidence of mitral stenosis.   4. The aortic valve is tricuspid. There is mild calcification of the  aortic valve. Aortic valve regurgitation is not visualized. No aortic  stenosis is present.   5. The inferior vena cava is normal in size with greater than 50%  respiratory variability, suggesting right atrial pressure of 3  mmHg.     Neuro/Psych  Headaches, neg Seizures PSYCHIATRIC DISORDERS Anxiety Depression     Neuromuscular disease (lumbar radiculopathy)    GI/Hepatic Neg liver ROS,GERD  Medicated,,  Endo/Other  diabetes (Hgb A1c 8.4), Poorly Controlled, Type 2, Insulin  DependentHypothyroidism    Renal/GU CRFRenal disease (stones)     Musculoskeletal  (+) Arthritis ,    Abdominal  (+) + obese  Peds  Hematology  (+) Blood dyscrasia, anemia Lab Results      Component                Value               Date                      WBC                      10.2                08/07/2024                HGB                      8.6 (L)             08/07/2024                HCT                      27.8 (L)            08/07/2024  MCV                      93.0                08/07/2024                PLT                      320                 08/07/2024              Anesthesia Other Findings Last Mounjaro: August  Reproductive/Obstetrics                              Anesthesia Physical Anesthesia Plan  ASA: 3  Anesthesia Plan: General   Post-op Pain Management: Tylenol  PO (pre-op)*   Induction: Intravenous  PONV Risk Score and Plan: 4 or greater and Ondansetron , Dexamethasone , Propofol  infusion, TIVA and Treatment may vary due to age or medical condition  Airway Management Planned: LMA  Additional Equipment:   Intra-op Plan:   Post-operative Plan: Extubation in OR  Informed Consent: I have reviewed the patients History and Physical, chart, labs and discussed the procedure including the risks, benefits and alternatives for the proposed anesthesia with the patient or authorized representative who has indicated his/her understanding and acceptance.     Dental advisory given  Plan Discussed with: CRNA and Anesthesiologist  Anesthesia Plan Comments: (Risks of general anesthesia discussed including, but not limited to, sore throat, hoarse voice,  chipped/damaged teeth, injury to vocal cords, nausea and vomiting, allergic reactions, lung infection, heart attack, stroke, and death. All questions answered. )         Anesthesia Quick Evaluation

## 2024-08-20 ENCOUNTER — Ambulatory Visit (HOSPITAL_COMMUNITY): Payer: Self-pay | Admitting: Medical

## 2024-08-20 ENCOUNTER — Ambulatory Visit (HOSPITAL_COMMUNITY)

## 2024-08-20 ENCOUNTER — Encounter (HOSPITAL_COMMUNITY)
Admission: RE | Disposition: A | Discharge status: Home / Self Care | Payer: Self-pay | Source: Ambulatory Visit | Attending: Urology

## 2024-08-20 ENCOUNTER — Ambulatory Visit (HOSPITAL_COMMUNITY)
Admission: RE | Admit: 2024-08-20 | Discharge: 2024-08-20 | Disposition: A | Discharge status: Home / Self Care | Source: Ambulatory Visit | Attending: Urology | Admitting: Urology

## 2024-08-20 ENCOUNTER — Other Ambulatory Visit: Payer: Self-pay

## 2024-08-20 ENCOUNTER — Ambulatory Visit (HOSPITAL_COMMUNITY): Payer: Self-pay

## 2024-08-20 ENCOUNTER — Encounter (HOSPITAL_COMMUNITY): Payer: Self-pay | Admitting: Urology

## 2024-08-20 DIAGNOSIS — N201 Calculus of ureter: Secondary | ICD-10-CM

## 2024-08-20 DIAGNOSIS — I10 Essential (primary) hypertension: Secondary | ICD-10-CM | POA: Diagnosis not present

## 2024-08-20 DIAGNOSIS — Z955 Presence of coronary angioplasty implant and graft: Secondary | ICD-10-CM | POA: Diagnosis not present

## 2024-08-20 DIAGNOSIS — N189 Chronic kidney disease, unspecified: Secondary | ICD-10-CM | POA: Insufficient documentation

## 2024-08-20 DIAGNOSIS — E039 Hypothyroidism, unspecified: Secondary | ICD-10-CM | POA: Insufficient documentation

## 2024-08-20 DIAGNOSIS — G4733 Obstructive sleep apnea (adult) (pediatric): Secondary | ICD-10-CM | POA: Diagnosis not present

## 2024-08-20 DIAGNOSIS — I110 Hypertensive heart disease with heart failure: Secondary | ICD-10-CM | POA: Diagnosis not present

## 2024-08-20 DIAGNOSIS — Z794 Long term (current) use of insulin: Secondary | ICD-10-CM | POA: Diagnosis not present

## 2024-08-20 DIAGNOSIS — Z01818 Encounter for other preprocedural examination: Secondary | ICD-10-CM

## 2024-08-20 DIAGNOSIS — E1165 Type 2 diabetes mellitus with hyperglycemia: Secondary | ICD-10-CM | POA: Diagnosis not present

## 2024-08-20 DIAGNOSIS — I509 Heart failure, unspecified: Secondary | ICD-10-CM | POA: Diagnosis not present

## 2024-08-20 DIAGNOSIS — E785 Hyperlipidemia, unspecified: Secondary | ICD-10-CM | POA: Insufficient documentation

## 2024-08-20 DIAGNOSIS — I130 Hypertensive heart and chronic kidney disease with heart failure and stage 1 through stage 4 chronic kidney disease, or unspecified chronic kidney disease: Secondary | ICD-10-CM | POA: Insufficient documentation

## 2024-08-20 DIAGNOSIS — Z466 Encounter for fitting and adjustment of urinary device: Secondary | ICD-10-CM

## 2024-08-20 DIAGNOSIS — E1122 Type 2 diabetes mellitus with diabetic chronic kidney disease: Secondary | ICD-10-CM | POA: Insufficient documentation

## 2024-08-20 DIAGNOSIS — I700 Atherosclerosis of aorta: Secondary | ICD-10-CM | POA: Insufficient documentation

## 2024-08-20 HISTORY — PX: CYSTOSCOPY/URETEROSCOPY/HOLMIUM LASER/STENT PLACEMENT: SHX6546

## 2024-08-20 LAB — BASIC METABOLIC PANEL WITH GFR
Anion gap: 10 (ref 5–15)
BUN: 12 mg/dL (ref 8–23)
CO2: 31 mmol/L (ref 22–32)
Calcium: 9.3 mg/dL (ref 8.9–10.3)
Chloride: 98 mmol/L (ref 98–111)
Creatinine, Ser: 1.24 mg/dL — ABNORMAL HIGH (ref 0.44–1.00)
GFR, Estimated: 45 mL/min — ABNORMAL LOW (ref >60)
Glucose, Bld: 134 mg/dL — ABNORMAL HIGH (ref 70–99)
Potassium: 3 mmol/L — ABNORMAL LOW (ref 3.5–5.1)
Sodium: 140 mmol/L (ref 135–145)

## 2024-08-20 LAB — GLUCOSE, CAPILLARY
Glucose-Capillary: 175 mg/dL — ABNORMAL HIGH (ref 70–99)
Glucose-Capillary: 130 mg/dL — ABNORMAL HIGH (ref 70–99)

## 2024-08-20 SURGERY — CYSTOSCOPY/URETEROSCOPY/HOLMIUM LASER/STENT PLACEMENT
Anesthesia: General | Laterality: Left

## 2024-08-20 MED ORDER — LIDOCAINE HCL (PF) 2 % IJ SOLN
INTRAMUSCULAR | Status: AC
  Filled 2024-08-20: qty 5

## 2024-08-20 MED ORDER — PHENYLEPHRINE 80 MCG/ML (10ML) SYRINGE FOR IV PUSH (FOR BLOOD PRESSURE SUPPORT)
PREFILLED_SYRINGE | INTRAVENOUS | Status: DC | PRN
  Administered 2024-08-20 (×2): 80 ug via INTRAVENOUS

## 2024-08-20 MED ORDER — CIPROFLOXACIN IN D5W 400 MG/200ML IV SOLN
400.0000 mg | INTRAVENOUS | Status: DC
Start: 2024-08-20 — End: 2024-08-20
  Filled 2024-08-20: qty 200

## 2024-08-20 MED ORDER — ACETAMINOPHEN 500 MG PO TABS
1000.0000 mg | ORAL_TABLET | Freq: Once | ORAL | Status: AC
  Administered 2024-08-20: 1000 mg via ORAL
  Filled 2024-08-20: qty 2

## 2024-08-20 MED ORDER — INSULIN ASPART 100 UNIT/ML IJ SOLN: 0.0000 [IU] | INTRAMUSCULAR | Status: DC | PRN

## 2024-08-20 MED ORDER — SODIUM CHLORIDE 0.9 % IR SOLN
Status: DC | PRN
  Administered 2024-08-20: 3000 mL

## 2024-08-20 MED ORDER — PROPOFOL 1000 MG/100ML IV EMUL
INTRAVENOUS | Status: AC
  Filled 2024-08-20: qty 100

## 2024-08-20 MED ORDER — CIPROFLOXACIN HCL 250 MG PO TABS: 250.0000 mg | ORAL_TABLET | Freq: Two times a day (BID) | ORAL | 0 refills | Status: DC

## 2024-08-20 MED ORDER — FENTANYL CITRATE (PF) 100 MCG/2ML IJ SOLN
INTRAMUSCULAR | Status: DC | PRN
  Administered 2024-08-20 (×2): 25 ug via INTRAVENOUS

## 2024-08-20 MED ORDER — CHLORHEXIDINE GLUCONATE 0.12 % MT SOLN
15.0000 mL | Freq: Once | OROMUCOSAL | Status: AC
  Administered 2024-08-20: 15 mL via OROMUCOSAL

## 2024-08-20 MED ORDER — ONDANSETRON HCL 4 MG/2ML IJ SOLN
INTRAMUSCULAR | Status: DC | PRN
  Administered 2024-08-20: 4 mg via INTRAVENOUS

## 2024-08-20 MED ORDER — MIDAZOLAM HCL 2 MG/2ML IJ SOLN
INTRAMUSCULAR | Status: AC
  Filled 2024-08-20: qty 2

## 2024-08-20 MED ORDER — DEXAMETHASONE SOD PHOSPHATE PF 10 MG/ML IJ SOLN
INTRAMUSCULAR | Status: DC | PRN
  Administered 2024-08-20: 5 mg via INTRAVENOUS

## 2024-08-20 MED ORDER — ORAL CARE MOUTH RINSE: 15.0000 mL | Freq: Once | OROMUCOSAL | Status: AC

## 2024-08-20 MED ORDER — PROPOFOL 10 MG/ML IV BOLUS
INTRAVENOUS | Status: DC | PRN
  Administered 2024-08-20: 100 ug/kg/min via INTRAVENOUS
  Administered 2024-08-20: 150 mg via INTRAVENOUS

## 2024-08-20 MED ORDER — OXYCODONE HCL 5 MG/5ML PO SOLN: 5.0000 mg | Freq: Once | ORAL | Status: DC | PRN

## 2024-08-20 MED ORDER — EPHEDRINE 5 MG/ML INJ
INTRAVENOUS | Status: AC
  Filled 2024-08-20: qty 5

## 2024-08-20 MED ORDER — PHENYLEPHRINE 80 MCG/ML (10ML) SYRINGE FOR IV PUSH (FOR BLOOD PRESSURE SUPPORT)
PREFILLED_SYRINGE | INTRAVENOUS | Status: AC
Start: 2024-08-20 — End: 2024-08-20
  Filled 2024-08-20: qty 20

## 2024-08-20 MED ORDER — EPHEDRINE SULFATE (PRESSORS) 50 MG/ML IJ SOLN
INTRAMUSCULAR | Status: DC | PRN
  Administered 2024-08-20: 10 mg via INTRAVENOUS

## 2024-08-20 MED ORDER — LIDOCAINE HCL (CARDIAC) PF 100 MG/5ML IV SOSY
PREFILLED_SYRINGE | INTRAVENOUS | Status: DC | PRN
  Administered 2024-08-20: 100 mg via INTRAVENOUS

## 2024-08-20 MED ORDER — ONDANSETRON HCL 4 MG/2ML IJ SOLN
INTRAMUSCULAR | Status: AC
  Filled 2024-08-20: qty 2

## 2024-08-20 MED ORDER — OXYCODONE HCL 5 MG PO TABS: 5.0000 mg | ORAL_TABLET | Freq: Once | ORAL | Status: DC | PRN

## 2024-08-20 MED ORDER — FENTANYL CITRATE (PF) 100 MCG/2ML IJ SOLN
INTRAMUSCULAR | Status: AC
  Filled 2024-08-20: qty 2

## 2024-08-20 MED ORDER — AMISULPRIDE (ANTIEMETIC) 5 MG/2ML IV SOLN: 10.0000 mg | Freq: Once | INTRAVENOUS | Status: DC | PRN

## 2024-08-20 MED ORDER — FENTANYL CITRATE (PF) 50 MCG/ML IJ SOSY: 25.0000 ug | PREFILLED_SYRINGE | INTRAMUSCULAR | Status: DC | PRN

## 2024-08-20 MED ORDER — LACTATED RINGERS IV SOLN: INTRAVENOUS | Status: DC

## 2024-08-20 SURGICAL SUPPLY — 17 items
BAG COUNTER SPONGE SURGICOUNT (BAG) IMPLANT
BAG URO CATCHER STRL LF (MISCELLANEOUS) ×1 IMPLANT
BASKET ZERO TIP NITINOL 2.4FR (BASKET) IMPLANT
CATH URETL OPEN END 6FR 70 (CATHETERS) IMPLANT
CLOTH BEACON ORANGE TIMEOUT ST (SAFETY) ×1 IMPLANT
GLOVE SURG LX STRL 7.5 STRW (GLOVE) ×1 IMPLANT
GOWN STRL REUS W/ TWL XL LVL3 (GOWN DISPOSABLE) ×1 IMPLANT
GUIDEWIRE STR DUAL SENSOR (WIRE) ×1 IMPLANT
GUIDEWIRE ZIPWRE .038 STRAIGHT (WIRE) IMPLANT
KIT TURNOVER KIT A (KITS) ×1 IMPLANT
MANIFOLD NEPTUNE II (INSTRUMENTS) ×1 IMPLANT
PACK CYSTO (CUSTOM PROCEDURE TRAY) ×1 IMPLANT
SHEATH NAVIGATOR HD 11/13X28 (SHEATH) IMPLANT
SHEATH NAVIGATOR HD 11/13X36 (SHEATH) IMPLANT
TRACTIP FLEXIVA PULS ID 200XHI (Laser) IMPLANT
TUBING CONNECTING 10 (TUBING) ×1 IMPLANT
TUBING UROLOGY SET (TUBING) ×1 IMPLANT

## 2024-08-20 NOTE — Anesthesia Postprocedure Evaluation (Signed)
 Anesthesia Post Note  Patient: Sue Davidson  Procedure(s) Performed: CYSTOSCOPY/URETEROSCOPY/STENT REMOVAL (Left)     Patient location during evaluation: PACU Anesthesia Type: General Level of consciousness: awake Pain management: pain level controlled Vital Signs Assessment: post-procedure vital signs reviewed and stable Respiratory status: spontaneous breathing, nonlabored ventilation and respiratory function stable Cardiovascular status: blood pressure returned to baseline and stable Postop Assessment: no apparent nausea or vomiting Anesthetic complications: no   No notable events documented.  Last Vitals:  Vitals:   08/20/24 1210 08/20/24 1230  BP: (!) 160/68   Pulse: 73   Resp: 17 17  Temp: (!) 36.4 C (!) 36.4 C  SpO2: 96%     Last Pain:  Vitals:   08/20/24 1230  TempSrc:   PainSc: 0-No pain                 [name]

## 2024-08-20 NOTE — Op Note (Signed)
 Preoperative diagnosis: Left ureteral calculus   Postoperative diagnosis: Left ureteral calculus  Procedures: 1.  Cystoscopy 2.  Left ureteroscopy 3.  Left ureteral stent removal  Surgeon: Gretel CANDIE Renda Mickey MD  Anesthesia: General  Complications: None  EBL: Minimal  Specimens: None  Indication: Sue Davidson is a 73 year old female who recently presented to the hospital and acute renal failure with a 6 mm left ureteral stone and concern for infection.  She underwent acute management with left ureteral stent placement and appropriate antibiotic therapy.  Her renal failure gradually resolved albeit slowly indicating that she likely had a prerenal insult as the etiology.  At this point, her creatinine has returned to close to her baseline at 1.4.  She did have E. coli on a urine culture preoperatively and has been treated with ciprofloxacin prior to her procedure today.  The potential risks, complications, and the expected recovery process were discussed in detail.  Informed consent was obtained.  Description of procedure: The patient was taken to the operating room and a general anesthetic was administered.  She was given preoperative antibiotics, placed in the dorsolithotomy position, and prepped and draped in the usual sterile fashion.  Next, preoperative timeout was performed.  Cystourethroscopy indicated fairly cloudy urine which was drained.  Reinspection revealed the indwelling left ureteral stent which was coated with white fluffy material consistent with bacterial colonization.  The stent was brought out to the urethral meatus and a 0.38 sensor guidewire was advanced up the stent into the left renal collecting system under fluoroscopic guidance.  Semirigid ureteroscopy was then performed and the ureteroscope was able to be advanced easily up into the renal collecting system.  No ureteral calculi were identified.  Visualization was somewhat cloudy in the renal pelvis as well.  At this point,  the semirigid ureteroscope was removed and a 12/14 ureteral access sheath was placed.  A digital flexible ureteroscope was then advanced into the renal collecting system and the renal pelvic urine was aspirated to improve visualization.  A complete inspection of the left renal collecting system was then performed and although visualization was not ideal, I was able to evaluate all renal calyces.  Although there was cloudy and fluffy consolidated material, there was no actual stone material identified.  At this point, the ureteroscope was carefully withdrawn and the entire ureter was again evaluated and no stone was seen.  She had been dilated from her prior stent and it was felt that she did not require a stent to be replaced.  Her bladder was emptied.  She was able to be awakened and transferred the recovery unit in satisfactory condition.  She will remain on appropriate culture specific antibiotics.

## 2024-08-20 NOTE — Anesthesia Procedure Notes (Signed)
 Procedure Name: LMA Insertion Date/Time: 08/20/2024 10:53 AM  Performed by: Belvie Valri NOVAK, CRNAPre-anesthesia Checklist: Patient identified, Emergency Drugs available, Suction available and Patient being monitored Patient Re-evaluated:Patient Re-evaluated prior to induction Oxygen Delivery Method: Circle System Utilized Preoxygenation: Pre-oxygenation with 100% oxygen Induction Type: IV induction LMA: LMA inserted LMA Size: 4.0 Number of attempts: 1 Airway Equipment and Method: Bite block Placement Confirmation: positive ETCO2 Tube secured with: Tape Dental Injury: Teeth and Oropharynx as per pre-operative assessment

## 2024-08-20 NOTE — H&P (Signed)
 Office Visit Report     07/13/2024   --------------------------------------------------------------------------------   Sue Davidson. [place]illa  MRN: [id number]  DOB: Oct 06, 1951, 73 year old Female  SSN:    PRIMARY CARE:     REFERRING:    PROVIDER:  Gretel Ferrara, M.D.  TREATING:  Ubaldo Eagles, NP  LOCATION:  [facility] [phone]     --------------------------------------------------------------------------------   CC/HPI: 07/13/2024: 73 year old female seen today for hospital follow-up. She presented to the emergency department on 8/11 with a sepsis picture, left-sided pain. CT imaging revealed a 6 mm obstructing left ureteral calculi. Urology consulted and Dr. Ferrara placed a stent at that time. Urine and blood cultures positive for E. coli bacteremia. She was admitted for further management. Postoperatively kidney function continued to worsen peaking in the mid sevens. Eventually pt required diuresis for volume overload (decompensated CHF most likely) causing further worsening of renal function. Creatinine coming down now after a large diuresis, 5.2 at time of discharge.   Past medical history significant for anemia, anxiety, depression, bilateral carotid arterial disease, type 2 diabetes, class II obesity, GERD, grade 1 diastolic dysfunction, pulmonary artery hypertension, hyperlipidemia, sleep apnea.   She is scheduled for definitive ureteroscopy with Dr. Ferrara on 10/13. Here today for follow-up evaluation, repeat labs. She presents from SNF. She is accompanied by her sister. Patient is scheduled to be discharged from rehab tomorrow. Overall doing well. Her clinical picture has significantly improved. She is not having any left-sided pain or discomfort indicative of worsening obstructive uropathy. She is having some expected irritative voiding symptoms with the indwelling stent but these are not excessive. She feels like she is emptying appropriately. She denies any dysuria  or gross hematuria. No recent fevers or chills, nausea/vomiting. She does have a past history of nephrolithiasis. About 3 years ago she was diagnosed with a small ureteral stone and was able to spontaneously pass this. She has never seen a urologist before. Of note she does have follow-up with nephrology next week, she was encouraged to keep that appointment. She also remains on tamsulosin  and tolerating it well. I sent a refill for her today.     ALLERGIES: No Known Drug Allergies    MEDICATIONS: Crestor  40 MG Tablet  Levothyroxine  Sodium 75 MCG Capsule  Omeprazole 40 MG Capsule Delayed Release  PriLOSEC  Tamsulosin  HCl 0.4 MG Capsule  Acetaminophen  8 Hour 650 MG Tablet Extended Release  amLODIPine  Besylate 5 MG Tablet  Benicar  40 MG Tablet  Decubi-Vite Capsule  Furosemide  80 MG Tablet  Loperamide HCl 2 MG Capsule  Milk of Magnesia 1200 MG/15ML Suspension  NovoLOG   Ondansetron  HCl 8 MG Tablet  Rosuvastatin  Calcium  10 MG Tablet  SB Low Dose ASA EC 81 MG Tablet Delayed Release     GU PSH: No GU PSH    NON-GU PSH: No Non-GU PSH    GU PMH: None   NON-GU PMH: GERD    FAMILY HISTORY: 1 - Daughter 1 son - Other   SOCIAL HISTORY: Marital Status: Widowed Ethnicity: Not Hispanic Or Latino; Race: White Current Smoking Status: Patient has never smoked.   Tobacco Use Assessment Completed: Used Tobacco in last 30 days? Has never drank.  Does not use drugs. Does not drink caffeine. Has not had a blood transfusion.    REVIEW OF SYSTEMS:    GU Review Female:   Patient reports frequent urination, get up at night to urinate, and leakage of urine. Patient denies hard to postpone urination, burning /pain with  urination, stream starts and stops, trouble starting your stream, have to strain to urinate, and being pregnant.  Gastrointestinal (Upper):   Patient reports nausea. Patient denies vomiting and indigestion/ heartburn.  Gastrointestinal (Lower):   Patient reports diarrhea. Patient  denies constipation.  Constitutional:   Patient denies fatigue, weight loss, night sweats, and fever.  Skin:   Patient denies skin rash/ lesion and itching.  Eyes:   Patient denies blurred vision and double vision.  Ears/ Nose/ Throat:   Patient denies sore throat and sinus problems.  Hematologic/Lymphatic:   Patient reports easy bruising. Patient denies swollen glands.  Cardiovascular:   Patient denies leg swelling and chest pains.  Respiratory:   Patient denies cough and shortness of breath.  Endocrine:   Patient reports excessive thirst.   Musculoskeletal:   Patient reports back pain. Patient denies joint pain.  Neurological:   Patient reports headaches. Patient denies dizziness.  Psychologic:   Patient reports anxiety. Patient denies depression.   VITAL SIGNS:      07/13/2024 01:13 PM  Weight 213 lb / 96.62 kg  Height 66 in / 167.64 cm  BP 154/76 mmHg  Pulse 74 /min  Temperature 98.1 F / 36.7 C  BMI 34.4 kg/m   MULTI-SYSTEM PHYSICAL EXAMINATION:    Constitutional: Well-nourished. No physical deformities. Normally developed. Good grooming.  Neck: Neck symmetrical, not swollen. Normal tracheal position.  Respiratory: No labored breathing, no use of accessory muscles.   Cardiovascular: Normal temperature, normal extremity pulses, no swelling, no varicosities.  Skin: No paleness, no jaundice, no cyanosis. No lesion, no ulcer, no rash.  Neurologic / Psychiatric: Oriented to time, oriented to place, oriented to person. No depression, no anxiety, no agitation.  Gastrointestinal: Obese abdomen. No mass, no tenderness, no rigidity.   Musculoskeletal: Normal gait and station of head and neck.     Complexity of Data:  Source Of History:  Patient, Family/Caregiver, Medical Record Summary  Lab Test Review:   BMP, CBC with Diff, CMP  Records Review:   Previous Doctor Records, Previous Hospital Records, Previous Patient Records  Urine Test Review:   Urinalysis, Urine Culture  X-Ray  Review: Renal Ultrasound: Reviewed Films. Reviewed Report.  C.T. Abdomen/Pelvis: Reviewed Films. Reviewed Report.     07/13/24  Urinalysis  Urine Appearance Cloudy   Urine Color Yellow   Urine Glucose Neg mg/dL  Urine Bilirubin Neg mg/dL  Urine Ketones Neg mg/dL  Urine Specific Gravity 1.015   Urine Blood 3+ ery/uL  Urine pH <=5.0   Urine Protein Trace mg/dL  Urine Urobilinogen 0.2 mg/dL  Urine Nitrites Neg   Urine Leukocyte Esterase 3+ leu/uL  Urine WBC/hpf 40 - 60/hpf   Urine RBC/hpf 20 - 40/hpf   Urine Epithelial Cells 6 - 10/hpf   Urine Bacteria Many (>50/hpf)   Urine Mucous Not Present   Urine Yeast NS (Not Seen)   Urine Trichomonas Not Present   Urine Cystals Amorph Urates   Urine Casts NS (Not Seen)   Urine Sperm Not Present   Notes:                     EXAM:  CT ABDOMEN AND PELVIS WITHOUT CONTRAST   TECHNIQUE:  Multidetector CT imaging of the abdomen and pelvis was performed  following the standard protocol without IV contrast.   RADIATION DOSE REDUCTION: This exam was performed according to the  departmental dose-optimization program which includes automated  exposure control, adjustment of the mA and/or kV according to  patient  size and/or use of iterative reconstruction technique.   COMPARISON: 06/15/2024   FINDINGS:  Lower chest: Mild left basilar atelectasis is noted.   Hepatobiliary: No focal liver abnormality is seen. Status post  cholecystectomy. No biliary dilatation.   Pancreas: Unremarkable. No pancreatic ductal dilatation or  surrounding inflammatory changes.   Spleen: Normal in size without focal abnormality.   Adrenals/Urinary Tract: Adrenal glands are within normal limits.  Right kidney shows no calculi or obstructive changes. Left kidney  demonstrates proximal ureteral stone stable in appearance from the  prior exam measuring 6 mm. Mild perinephric stranding is seen. No  left renal calculi are noted. The bladder is within normal limits.    Stomach/Bowel: No obstructive or inflammatory changes of the colon  are noted. The appendix is not visualized consistent with a prior  surgical history. Small bowel and stomach are unremarkable.   Vascular/Lymphatic: Aortic atherosclerosis. No enlarged abdominal or  pelvic lymph nodes.   Reproductive: Status post hysterectomy. No adnexal masses.   Other: No abdominal wall hernia or abnormality. No abdominopelvic  ascites.   Musculoskeletal: No acute or significant osseous findings.   IMPRESSION:  Stable proximal left ureteral stone similar to that seen on the  prior exam. Mild hydronephrosis is noted. No new focal abnormality  is seen.    Electronically Signed  By: Oneil Devonshire M.D.  On: 06/18/2024 02:54     PROCEDURES:          Visit Complexity - G2211          Urinalysis w/Scope Dipstick Dipstick Cont'd Micro  Color: Yellow Bilirubin: Neg mg/dL WBC/hpf: 40 - 39/yeq  Appearance: Cloudy Ketones: Neg mg/dL RBC/hpf: 20 - 59/yeq  Specific Gravity: 1.015 Blood: 3+ ery/uL Bacteria: Many (>50/hpf)  pH: <=5.0 Protein: Trace mg/dL Cystals: Amorph Urates  Glucose: Neg mg/dL Urobilinogen: 0.2 mg/dL Casts: NS (Not Seen)    Nitrites: Neg Trichomonas: Not Present    Leukocyte Esterase: 3+ leu/uL Mucous: Not Present      Epithelial Cells: 6 - 10/hpf      Yeast: NS (Not Seen)      Sperm: Not Present    Notes: too numerous to count    ASSESSMENT:      ICD-10 Details  1 GU:   Ureteral calculus - N20.1 Left, Acute, Complicated Injury  2   Ureteral obstruction secondary to calculous - N13.2 Left, Acute, Complicated Injury, Improving  3   Acute kidney failure - N17.0 Acute, Complicated Injury, Improving   PLAN:            Medications New Meds: Tamsulosin  HCl 0.4 MG Capsule 1 capsule PO Daily   #30  2 Refill(s)  Pharmacy Name:  [facility] [id number]  Address:  [address]   [place], [place] [address]  Phone:  [phone]  Fax:  [phone]             Orders Labs BMP, Urine Culture          Schedule Return Visit/Planned Activity: Keep Scheduled Appointment - Follow up MD, Schedule Surgery          Document Letter(s):  Created for Patient: Clinical Summary         Notes:   Patient tolerating her stent appropriately. No acute clinical concerns for continued infectious process. UA today expectedly abnormal. She will have a urine culture sent today, if indicated antimicrobial treatment will be prescribed to cover her appropriately for her upcoming ureteroscopy procedure. She will also have a  BMP checked today as well.   For ureteroscopy I described the risks which include heart attack, stroke, pulmonary embolus, death, bleeding, infection, damage to contiguous structures, positioning injury, ureteral stricture, ureteral avulsion, ureteral injury, need for ureteral stent, inability to perform ureteroscopy, need for an interval procedure, inability to clear stone burden, stent discomfort and pain.   All questions answered to the best my ability regarding the upcoming procedure and expected postoperative course with understanding expressed by the patient and her sister present today. Moving forward she will proceed with previously scheduled ureteroscopy with Dr. Renda on 10/13        Next Appointment:      Next Appointment: 08/20/2024 03:15 PM    Appointment Type: Surgery     Location: [facility] [phone]    Provider: Gretel Renda, M.D.    Reason for Visit: WL/OP CYSTO, (L) URS, HLL, (L) STENT      * Signed by Ubaldo Eagles, NP on 07/13/24 at 1:45 PM (EDT)*

## 2024-08-20 NOTE — Transfer of Care (Signed)
 Immediate Anesthesia Transfer of Care Note  Patient: Sue Davidson  Procedure(s) Performed: CYSTOSCOPY/URETEROSCOPY/STENT REMOVAL (Left)  Patient Location: PACU  Anesthesia Type:General  Level of Consciousness: drowsy and patient cooperative  Airway & Oxygen Therapy: Patient Spontanous Breathing  Post-op Assessment: Report given to RN and Post -op Vital signs reviewed and stable  Post vital signs: Reviewed and stable  Last Vitals:  Vitals Value Taken Time  BP    Temp    Pulse 85 08/20/24 11:41  Resp 14 08/20/24 11:41  SpO2 93 % 08/20/24 11:41  Vitals shown include unfiled device data.  Last Pain:  Vitals:   08/20/24 0916  TempSrc:   PainSc: 0-No pain         Complications: No notable events documented.

## 2024-08-20 NOTE — Discharge Instructions (Addendum)
 You may see some blood in the urine and may have some burning with urination for 48-72 hours. You also may notice that you have to urinate more frequently or urgently after your procedure which is normal.  You should call should you develop an inability urinate, fever > 101, persistent nausea and vomiting that prevents you from eating or drinking to stay hydrated.

## 2024-08-21 ENCOUNTER — Encounter (HOSPITAL_COMMUNITY): Payer: Self-pay | Admitting: Urology

## 2024-08-23 DIAGNOSIS — E039 Hypothyroidism, unspecified: Secondary | ICD-10-CM | POA: Diagnosis not present

## 2024-08-23 DIAGNOSIS — E559 Vitamin D deficiency, unspecified: Secondary | ICD-10-CM | POA: Diagnosis not present

## 2024-08-23 DIAGNOSIS — E1165 Type 2 diabetes mellitus with hyperglycemia: Secondary | ICD-10-CM | POA: Diagnosis not present

## 2024-08-24 DIAGNOSIS — I509 Heart failure, unspecified: Secondary | ICD-10-CM | POA: Diagnosis not present

## 2024-08-24 DIAGNOSIS — E1165 Type 2 diabetes mellitus with hyperglycemia: Secondary | ICD-10-CM | POA: Diagnosis not present

## 2024-08-27 DIAGNOSIS — E7800 Pure hypercholesterolemia, unspecified: Secondary | ICD-10-CM | POA: Diagnosis not present

## 2024-08-27 DIAGNOSIS — E1165 Type 2 diabetes mellitus with hyperglycemia: Secondary | ICD-10-CM | POA: Diagnosis not present

## 2024-08-27 DIAGNOSIS — I10 Essential (primary) hypertension: Secondary | ICD-10-CM | POA: Diagnosis not present

## 2024-08-27 DIAGNOSIS — E039 Hypothyroidism, unspecified: Secondary | ICD-10-CM | POA: Diagnosis not present

## 2024-08-28 ENCOUNTER — Ambulatory Visit: Admitting: Nurse Practitioner

## 2024-08-28 ENCOUNTER — Ambulatory Visit: Admitting: Podiatry

## 2024-09-08 DIAGNOSIS — E119 Type 2 diabetes mellitus without complications: Secondary | ICD-10-CM | POA: Diagnosis not present

## 2024-09-10 ENCOUNTER — Encounter: Payer: Self-pay | Admitting: Radiology

## 2024-09-11 DIAGNOSIS — N201 Calculus of ureter: Secondary | ICD-10-CM | POA: Diagnosis not present

## 2024-09-12 DIAGNOSIS — M51362 Other intervertebral disc degeneration, lumbar region with discogenic back pain and lower extremity pain: Secondary | ICD-10-CM | POA: Diagnosis not present

## 2024-09-19 DIAGNOSIS — M51362 Other intervertebral disc degeneration, lumbar region with discogenic back pain and lower extremity pain: Secondary | ICD-10-CM | POA: Diagnosis not present

## 2024-10-01 DIAGNOSIS — H04123 Dry eye syndrome of bilateral lacrimal glands: Secondary | ICD-10-CM | POA: Diagnosis not present

## 2024-10-01 DIAGNOSIS — E113293 Type 2 diabetes mellitus with mild nonproliferative diabetic retinopathy without macular edema, bilateral: Secondary | ICD-10-CM | POA: Diagnosis not present

## 2024-10-01 DIAGNOSIS — H35033 Hypertensive retinopathy, bilateral: Secondary | ICD-10-CM | POA: Diagnosis not present

## 2024-10-01 DIAGNOSIS — H26493 Other secondary cataract, bilateral: Secondary | ICD-10-CM | POA: Diagnosis not present

## 2024-10-01 DIAGNOSIS — Z961 Presence of intraocular lens: Secondary | ICD-10-CM | POA: Diagnosis not present

## 2024-10-02 ENCOUNTER — Ambulatory Visit: Attending: Cardiology | Admitting: Cardiology

## 2024-10-02 ENCOUNTER — Encounter: Payer: Self-pay | Admitting: Cardiology

## 2024-10-02 ENCOUNTER — Telehealth: Payer: Self-pay | Admitting: *Deleted

## 2024-10-02 VITALS — BP 140/50 | Ht 66.0 in | Wt 216.0 lb

## 2024-10-02 DIAGNOSIS — G4733 Obstructive sleep apnea (adult) (pediatric): Secondary | ICD-10-CM

## 2024-10-02 DIAGNOSIS — I5032 Chronic diastolic (congestive) heart failure: Secondary | ICD-10-CM

## 2024-10-02 DIAGNOSIS — I10 Essential (primary) hypertension: Secondary | ICD-10-CM

## 2024-10-02 NOTE — Patient Instructions (Signed)
 Medication Instructions:  Your physician recommends that you continue on your current medications as directed. Please refer to the Current Medication list given to you today.  *If you need a refill on your cardiac medications before your next appointment, please call your pharmacy*  Lab Work: None.  If you have labs (blood work) drawn today and your tests are completely normal, you will receive your results only by: MyChart Message (if you have MyChart) OR A paper copy in the mail If you have any lab test that is abnormal or we need to change your treatment, we will call you to review the results.  Testing/Procedures: None.  Follow-Up: At [facility], you and your health needs are our priority.  As part of our continuing mission to provide you with exceptional heart care, our providers are all part of one team.  This team includes your primary Cardiologist (physician) and Advanced Practice Providers or APPs (Physician Assistants and Nurse Practitioners) who all work together to provide you with the care you need, when you need it.  Your next appointment:   2 month(s)  Provider:   Dr. Gaylyn Keas, MD

## 2024-10-02 NOTE — Telephone Encounter (Signed)
 Order an under the nose full face mask and set up with [employer] >>she used to have [employer] .   Order placed to [employer] [facility] via community message.

## 2024-10-02 NOTE — Telephone Encounter (Signed)
-----   Message from Wilbert Bihari sent at 10/02/2024  8:59 AM EST ----- Order an under the nose full face mask and set up with [employer] >>she used to have [employer]

## 2024-10-02 NOTE — Progress Notes (Signed)
 Sleep Medicine CONSULT Note    Date:  10/02/2024   ID:  Sue, Davidson 1951-08-26, MRN [id number]  PCP:  Sue Nottingham, MD  Cardiologist: Sue Balding, MD   Chief Complaint  Patient presents with   New Patient (Initial Visit)    Obstructive sleep apnea    History of Present Illness:  Sue Davidson is a 73 y.o. female who is being seen today for the evaluation of obstructive apnea.  At the request of Sue Balding, MD.  This is a 73 year old female with a history of CKD, diabetes, GERD, hyperlipidemia, hypertension PAD and obstructive sleep apnea.  She had an initial sleep study in 2006 which demonstrated an AHI of 4/h but REM AHI 25.5/h with snoring.  She was started on CPAP therapy in 2007 and then referred to Sue Davidson in 2017.  She had complaints of daytime sleepiness and snoring loudly.  She wo she uld only be able to sleep 2 to 3 hours despite being in bed for 7 hours because she was having a lot of knee problems.  When another sleep study in 2017 and was felt to have moderate obstructive sleep apnea and underwent AutoPap titration.  She was using a ResMed AirFit P10 small mask and AHI was 0.5 on auto CPAP with a maximum average pressure of 11 cm H2O.  The last time she saw Sue Davidson was in 2017 but never followed up after that.  She is now here for sleep consultation to discuss her sleep management.  She tells me that she is sleeping poorly and only sleeps a few hours at a time.  She had been using CPAP but she had problems with her mask fitting poorly and would have to wake up and readjust it.  She went to her DME and she had a mask fitting which helped but not much.  She stopped using her CPAP in march 2025.  Her mask was a FFM.  Since stopping her CPAP she is feeling more tired during the day.  This was complicated by developing anemia in the early Fall.  She still has her CPAP machine but needs help in finding a mask that will work for her.  She goes to bed around  11:30PM and falls asleep within a few minutes.  She then will wake up around 1:30-2AM and then watches TV because she cannot go back to sleep.  She will dooze on and off the rest of the night and get up around 6AM.  She takes a nap during the day. She does not know if she snores.  Stop Bang score 4.   Past Medical History:  Diagnosis Date   Anemia    hx of   Anginal pain    hx of   Anxiety    Arthritis    Chronic kidney disease    Depression    Diabetes mellitus without complication (HCC)    GERD (gastroesophageal reflux disease)    Headache(784.0)    Heart murmur    History of kidney stones    Hyperlipidemia    Hypertension    Hypothyroidism    MRSA (methicillin resistant staph aureus) culture positive    many years ago-abdominal wound- no issuses since.   Peripheral vascular disease    PONV (postoperative nausea and vomiting)    severe   Sleep apnea    Study done -remains under evaluation- no cpap yet.   Vitamin D deficiency     Past Surgical  History:  Procedure Laterality Date   ABDOMINAL HYSTERECTOMY  1970's   APPENDECTOMY  1970's   BREAST BIOPSY Left 2018   CARDIAC CATHETERIZATION     CHOLECYSTECTOMY N/A 10/02/2013   Procedure: LAPAROSCOPIC CHOLECYSTECTOMY;  Surgeon: Sue CHRISTELLA Spinner, MD;  Location: [facility];  Service: General;  Laterality: N/A;   cyst removed Left    wristganglion   CYSTOSCOPY WITH STENT PLACEMENT Left 06/18/2024   Procedure: CYSTOSCOPY, WITH STENT INSERTION;  Surgeon: Sue Glance, MD;  Location: [facility];  Service: Urology;  Laterality: Left;   CYSTOSCOPY/URETEROSCOPY/HOLMIUM LASER/STENT PLACEMENT Left 08/20/2024   Procedure: CYSTOSCOPY/URETEROSCOPY/STENT REMOVAL;  Surgeon: Sue Glance, MD;  Location: [facility];  Service: Urology;  Laterality: Left;   ELBOW SURGERY Left    HARDWARE REMOVAL Left    ankle   KNEE ARTHROSCOPY Right 06/30/2016   Procedure: ARTHROSCOPY RIGHT KNEE WITH MEDIAL AND LATERAL MENSICAL DEBRIDEMENT;  Surgeon: Sue Moan, MD;   Location: [facility];  Service: Orthopedics;  Laterality: Right;  LMA   LEG SURGERY Left 1980's   broke leg and ankle   SHOULDER ARTHROSCOPY Bilateral    TONSILLECTOMY  1970's   TUBAL LIGATION  1970's    Current Medications: Current Meds  Medication Sig   acetaminophen  (TYLENOL ) 650 MG CR tablet Take 1,300 mg by mouth every 8 (eight) hours as needed for pain.   amLODipine  (NORVASC ) 2.5 MG tablet Take 2.5 mg by mouth in the morning.   aspirin  EC 81 MG tablet Take 81 mg by mouth daily.   buPROPion  (WELLBUTRIN  XL) 150 MG 24 hr tablet Take 150 mg by mouth in the morning.   Cholecalciferol (VITAMIN D-3 PO) Take 1 capsule by mouth in the morning.   furosemide  (LASIX ) 80 MG tablet TAKE 1 TABLET BY MOUTH EVERY DAY   insulin  NPH-regular Human (NOVOLIN 70/30) (70-30) 100 UNIT/ML injection Inject 10-20 Units into the skin See admin instructions. Inject 10-15 units into the skin with breakfast & inject 20-30 units into the skin with dinner.   levothyroxine  (SYNTHROID ) 75 MCG tablet Take 75 mcg by mouth daily before breakfast.   olmesartan  (BENICAR ) 40 MG tablet Take 40 mg by mouth daily.   omeprazole (PRILOSEC) 40 MG capsule Take 40 mg by mouth 2 (two) times daily.   ondansetron  (ZOFRAN ) 8 MG tablet Take 8 mg by mouth daily as needed for nausea or vomiting.   rosuvastatin  (CRESTOR ) 40 MG tablet Take 40 mg by mouth in the morning.   sertraline  (ZOLOFT ) 100 MG tablet Take 100 mg by mouth at bedtime.   tirzepatide (MOUNJARO) 5 MG/0.5ML Pen Inject 5 mg into the skin every Monday.    Allergies:   Actos [pioglitazone], Codeine, Ms contin  [morphine ], Neurontin  [gabapentin ], Ozempic (0.25 or 0.5 mg-dose) [semaglutide(0.25 or 0.5mg -dos)], and Tape   Social History   Socioeconomic History   Marital status: Widowed    Spouse name: Not on file   Number of children: Not on file   Years of education: Not on file   Highest education level: Not on file  Occupational History   Not on file  Tobacco Use    Smoking status: Never   Smokeless tobacco: Never  Vaping Use   Vaping status: Never Used  Substance and Sexual Activity   Alcohol use: No    Alcohol/week: 0.0 standard drinks of alcohol   Drug use: No   Sexual activity: Yes    Birth control/protection: Spermicide  Other Topics Concern   Not on file  Social History Narrative   Epworth  Sleepiness Scale Score:  15      --I have HTN   --I have had Insomnia   --I feel stressed and lack motivation   --I have Diabetes   --I am overweight or am gaining weight   --I awake feeling not rested   Social Drivers of Health   Financial Resource Strain: Not on file  Food Insecurity: Food Insecurity Present (06/21/2024)   Hunger Vital Sign    Worried About Running Out of Food in the Last Year: Sometimes true    Ran Out of Food in the Last Year: Never true  Transportation Needs: No Transportation Needs (06/21/2024)   PRAPARE - Administrator, Civil Service (Medical): No    Lack of Transportation (Non-Medical): No  Physical Activity: Not on file  Stress: Not on file  Social Connections: Moderately Integrated (06/18/2024)   Social Connection and Isolation Panel    Frequency of Communication with Friends and Family: More than three times a week    Frequency of Social Gatherings with Friends and Family: More than three times a week    Attends Religious Services: More than 4 times per year    Active Member of [employer] or Organizations: Yes    Attends Banker Meetings: 1 to 4 times per year    Marital Status: Widowed     Family History:  The patient's family history includes Breast cancer in her paternal aunt; Cancer in her father, paternal aunt, and paternal aunt; Diabetes in her mother; Heart disease in her father and mother.   ROS:   Please see the history of present illness.    ROS All other systems reviewed and are negative.     09/28/2024    9:13 AM 06/11/2024    9:12 AM  PAD Screen  Previous PAD dx? Yes Yes   Previous surgical procedure? No No  Pain with walking? Yes Yes  Subsides with rest? Yes Yes  Feet/toe relief with dangling? No No  Painful, non-healing ulcers? No No  Extremities discolored? Yes Yes       PHYSICAL EXAM:   VS:  BP (!) 140/50   Ht 5' 6 (1.676 m)   Wt 216 lb (98 kg)   BMI 34.86 kg/m    GEN: Well nourished, well developed, in no acute distress  HEENT: normal  Neck: no JVD, carotid bruits, or masses Cardiac: RRR; no murmurs, rubs, or gallops,no edema.  Intact distal pulses bilaterally.  Respiratory:  clear to auscultation bilaterally, normal work of breathing GI: soft, nontender, nondistended, + BS MS: no deformity or atrophy  Skin: warm and dry, no rash Neuro:  Alert and Oriented x 3, Strength and sensation are intact Psych: euthymic mood, full affect  Wt Readings from Last 3 Encounters:  10/02/24 216 lb (98 kg)  08/20/24 205 lb 14.6 oz (93.4 kg)  08/07/24 206 lb (93.4 kg)      Studies/Labs Reviewed:   Sleep study in 2006 and 2017  Recent Labs: 06/19/2024: ALT 73 06/20/2024: Magnesium 2.1 08/07/2024: Hemoglobin 8.6; Platelets 320 08/20/2024: BUN 12; Creatinine, Ser 1.24; Potassium 3.0; Sodium 140    ASSESSMENT:    No diagnosis found.   PLAN:  In order of problems listed above:  OSA  -she has a hx of OSA and had been on CPAP up until 01/2024 when she started having problems with her CPAP mask and stopped using it -she does have excessive daytime sleepiness with Stop Bang Score of 4 -I have recommended that  she try an under the nose FFM and start back on her auto CPAP from 4 to 11cm H2O -she used to have [facility] but closed so I will [facility] up with [facility] -I will see her back in 2 months  HTN -BP controlled on exam today -continue Amlodipine  2.5mg  daily, Olmesartan  40mg  daily  Followup in 2 months  Time Spent: 20 minutes total time of encounter, including 15 minutes spent in face-to-face patient care on the date of this encounter.  This time includes coordination of care and counseling regarding above mentioned problem list. Remainder of non-face-to-face time involved reviewing chart documents/testing relevant to the patient encounter and documentation in the medical record. I have independently reviewed documentation from referring provider  Medication Adjustments/Labs and Tests Ordered: Current medicines are reviewed at length with the patient today.  Concerns regarding medicines are outlined above.  Medication changes, Labs and Tests ordered today are listed in the Patient Instructions below.  There are no Patient Instructions on file for this visit.   Signed, Wilbert Bihari, MD  10/02/2024 8:51 AM    [facility] [address] [place], [place], [place]  [address] Phone: [phone]; Fax: [phone]

## 2024-10-08 DIAGNOSIS — N179 Acute kidney failure, unspecified: Secondary | ICD-10-CM | POA: Diagnosis not present

## 2024-10-14 NOTE — Progress Notes (Deleted)
 "     Sue Console, PA-C [address] [place], [place]  [address] Phone: [phone]   Gastroenterology Consultation  Referring Provider:     Clarice Nottingham, MD Primary Care Physician:  Clarice Nottingham, MD Primary Gastroenterologist:  Sue Console, PA-C / *** Reason for Consultation:     Worsening anemia        HPI:   Discussed the use of AI scribe software for clinical note transcription with the patient, who gave verbal consent to proceed.  73 year old female with history of CKD, diabetes, GERD, sleep apnea, hyperlipidemia, HTN, PAD presents for evaluation of worsening anemia.  08/2023 echo LVEF 60 to 65%.  She was hospitalized 06/2024 with sepsis, left-sided pain.  CT showed 6 mm obstructing left ureter calculi.  Urology consulted and Dr. Renda placed a stent.  Urine and blood cultures positive for E. coli bacteremia.  Postop kidney function worsened.  Patient required diuresis from volume overload.  Since then kidney function and clinical picture has significantly improved.  Underwent ureteroscopy with Dr. Renda 08/20/2024.  Last colonoscopy:  Last EGD:  Current symptoms:  History of Present Illness     Component Ref Range & Units (hover) 2 mo ago (08/07/24) 3 mo ago (06/23/24) 3 mo ago (06/21/24) 3 mo ago (06/20/24) 3 mo ago (06/19/24) 3 mo ago (06/18/24) 1 yr ago (12/13/22)  WBC 10.2 12.1 High  10.2 13.9 High  13.2 High  14.8 High  9.3  RBC 2.99 Low  3.92 3.48 Low  3.75 Low  3.77 Low  3.96 4.53  Hemoglobin 8.6 Low  11.5 Low  10.2 Low  11.2 Low  11.3 Low  12.0 13.1  HCT 27.8 Low  36.4 32.0 Low  35.1 Low  35.6 Low  36.2 40.0  MCV 93.0 92.9 92.0 93.6 94.4 91.4 88.3  MCH 28.8 29.3 29.3 29.9 30.0 30.3 28.9  MCHC 30.9 31.6 31.9 31.9 31.7 33.1 32.8  RDW 15.0 15.2 14.4 14.2 14.1 14.1 14.8  Platelets 320 193 128 Low  117 Low  123 Low  159 219    Past Medical History:  Diagnosis Date   Anemia    hx of   Anginal pain    hx of   Anxiety    Arthritis    Chronic kidney  disease    Depression    Diabetes mellitus without complication (HCC)    GERD (gastroesophageal reflux disease)    Headache(784.0)    Heart murmur    History of kidney stones    Hyperlipidemia    Hypertension    Hypothyroidism    MRSA (methicillin resistant staph aureus) culture positive    many years ago-abdominal wound- no issuses since.   Peripheral vascular disease    PONV (postoperative nausea and vomiting)    severe   Sleep apnea    Study done -remains under evaluation- no cpap yet.   Vitamin D deficiency     Past Surgical History:  Procedure Laterality Date   ABDOMINAL HYSTERECTOMY  1970's   APPENDECTOMY  1970's   BREAST BIOPSY Left 2018   CARDIAC CATHETERIZATION     CHOLECYSTECTOMY N/A 10/02/2013   Procedure: LAPAROSCOPIC CHOLECYSTECTOMY;  Surgeon: Krystal CHRISTELLA Spinner, MD;  Location: [facility];  Service: General;  Laterality: N/A;   cyst removed Left    wristganglion   CYSTOSCOPY WITH STENT PLACEMENT Left 06/18/2024   Procedure: CYSTOSCOPY, WITH STENT INSERTION;  Surgeon: Sue Glance, MD;  Location: [facility];  Service: Urology;  Laterality: Left;   CYSTOSCOPY/URETEROSCOPY/HOLMIUM  LASER/STENT PLACEMENT Left 08/20/2024   Procedure: CYSTOSCOPY/URETEROSCOPY/STENT REMOVAL;  Surgeon: Sue Glance, MD;  Location: [facility];  Service: Urology;  Laterality: Left;   ELBOW SURGERY Left    HARDWARE REMOVAL Left    ankle   KNEE ARTHROSCOPY Right 06/30/2016   Procedure: ARTHROSCOPY RIGHT KNEE WITH MEDIAL AND LATERAL MENSICAL DEBRIDEMENT;  Surgeon: Dempsey Moan, MD;  Location: [facility];  Service: Orthopedics;  Laterality: Right;  LMA   LEG SURGERY Left 1980's   broke leg and ankle   SHOULDER ARTHROSCOPY Bilateral    TONSILLECTOMY  1970's   TUBAL LIGATION  1970's    Prior to Admission medications   Medication Sig Start Date End Date Taking? Authorizing Provider  acetaminophen  (TYLENOL ) 650 MG CR tablet Take 1,300 mg by mouth every 8 (eight) hours as needed for pain.    [provider]  amLODipine  (NORVASC ) 2.5 MG tablet Take 2.5 mg by mouth in the morning.    [provider]  aspirin  EC 81 MG tablet Take 81 mg by mouth daily.    [provider]  buPROPion  (WELLBUTRIN  XL) 150 MG 24 hr tablet Take 150 mg by mouth in the morning.    [provider]  Cholecalciferol (VITAMIN D-3 PO) Take 1 capsule by mouth in the morning.    [provider]  ciprofloxacin  (CIPRO ) 250 MG tablet Take 250 mg by mouth 2 (two) times daily.    [provider]  ciprofloxacin  (CIPRO ) 250 MG tablet Take 1 tablet (250 mg total) by mouth 2 (two) times daily. 08/20/24   Sue Glance, MD  furosemide  (LASIX ) 80 MG tablet TAKE 1 TABLET BY MOUTH EVERY DAY 10/30/19   Meng, Hao, PA  insulin  NPH-regular Human (NOVOLIN 70/30) (70-30) 100 UNIT/ML injection Inject 10-20 Units into the skin See admin instructions. Inject 10-15 units into the skin with breakfast & inject 20-30 units into the skin with dinner.    [provider]  levothyroxine  (SYNTHROID ) 75 MCG tablet Take 75 mcg by mouth daily before breakfast.    [provider]  olmesartan  (BENICAR ) 40 MG tablet Take 40 mg by mouth daily.    [provider]  omeprazole (PRILOSEC) 40 MG capsule Take 40 mg by mouth 2 (two) times daily. 07/18/13   [provider]  ondansetron  (ZOFRAN ) 8 MG tablet Take 8 mg by mouth daily as needed for nausea or vomiting.    [provider]  rosuvastatin  (CRESTOR ) 40 MG tablet Take 40 mg by mouth in the morning.    [provider]  sertraline  (ZOLOFT ) 100 MG tablet Take 100 mg by mouth at bedtime.    [provider]  tamsulosin  (FLOMAX ) 0.4 MG CAPS capsule Take 1 capsule (0.4 mg total) by mouth daily after supper. 06/15/24   Emil Share, DO  tirzepatide [facility]) 5 MG/0.5ML Pen Inject 5 mg into the skin every Monday.    [provider]    Family History  Problem Relation Age of Onset   Diabetes Mother     Heart disease Mother        CABG age 79s   Cancer Father        throat and lung   Heart disease Father    Cancer Paternal Aunt        breast   Breast cancer Paternal Aunt    Cancer Paternal Aunt        colon     Social History   Tobacco Use   Smoking status: Never  Smokeless tobacco: Never  Vaping Use   Vaping status: Never Used  Substance Use Topics   Alcohol use: No    Alcohol/week: 0.0 standard drinks of alcohol   Drug use: No    Allergies as of 10/15/2024 - Review Complete 10/02/2024  Allergen Reaction Noted   Actos [pioglitazone] Other (See Comments) 11/25/2023   Codeine Nausea And Vomiting 06/18/2024   Ms contin  [morphine ] Other (See Comments) 06/18/2024   Neurontin  [gabapentin ] Other (See Comments) 06/18/2024   Ozempic (0.25 or 0.5 mg-dose) [semaglutide(0.25 or 0.5mg -dos)] Nausea And Vomiting 11/25/2023   Tape Rash 06/22/2016    Review of Systems:    All systems reviewed and negative except where noted in HPI.   Physical Exam:  There were no vitals taken for this visit. No LMP recorded. Patient has had a hysterectomy.  General:   Alert,  Well-developed, well-nourished, pleasant and cooperative in NAD Lungs:  Respirations even and unlabored.  Clear throughout to auscultation.   No wheezes, crackles, or rhonchi. No acute distress. Heart:  Regular rate and rhythm; no murmurs, clicks, rubs, or gallops. Abdomen:  Normal bowel sounds.  No bruits.  Soft, and non-distended without masses, hepatosplenomegaly or hernias noted.  No Tenderness.  No guarding or rebound tenderness.    Neurologic:  Alert and oriented x3;  grossly normal neurologically. Psych:  Alert and cooperative. Normal mood and affect.   Imaging Studies: No results found.  Labs: CBC    Component Value Date/Time   WBC 10.2 08/07/2024 1152   RBC 2.99 (L) 08/07/2024 1152   HGB 8.6 (L) 08/07/2024 1152   HGB 12.5 06/18/2013 0846   HCT 27.8 (L) 08/07/2024 1152   HCT 36.7 06/18/2013 0846   PLT 320  08/07/2024 1152   PLT 230 06/18/2013 0846   MCV 93.0 08/07/2024 1152   MCV 85 06/18/2013 0846    CMP     Component Value Date/Time   NA 140 08/20/2024 1159   NA 140 08/03/2019 0848   NA 137 06/18/2013 0846   K 3.0 (L) 08/20/2024 1159   K 4.1 06/18/2013 0846   CL 98 08/20/2024 1159   CL 104 06/18/2013 0846   CO2 31 08/20/2024 1159   CO2 30 06/18/2013 0846   GLUCOSE 134 (H) 08/20/2024 1159   GLUCOSE 159 (H) 06/18/2013 0846   BUN 12 08/20/2024 1159   BUN 19 08/03/2019 0848   BUN 16 06/18/2013 0846   CREATININE 1.24 (H) 08/20/2024 1159   CREATININE 0.94 06/18/2013 0846   CALCIUM  9.3 08/20/2024 1159   CALCIUM  9.0 06/18/2013 0846   PROT 6.0 (L) 06/19/2024 0304   PROT 7.7 06/18/2013 0846   ALBUMIN 2.2 (L) 06/28/2024 0607   ALBUMIN 3.2 (L) 06/18/2013 0846   AST 261 (H) 06/19/2024 0304   AST 25 06/18/2013 0846   ALT 73 (H) 06/19/2024 0304   ALT 20 06/18/2013 0846   ALKPHOS 64 06/19/2024 0304   ALKPHOS 92 06/18/2013 0846   BILITOT 0.4 06/19/2024 0304   BILITOT 0.3 06/18/2013 0846   GFRNONAA 45 (L) 08/20/2024 1159   GFRNONAA >60 06/18/2013 0846   GFRAA 64 08/03/2019 0848   GFRAA >60 06/18/2013 0846   Component Ref Range & Units (hover) 1 mo ago (08/20/24) 2 mo ago (08/07/24) 3 mo ago (06/28/24) 3 mo ago (06/27/24) 3 mo ago (06/26/24) 3 mo ago (06/25/24) 3 mo ago (06/25/24)  Sodium 140 138 138 137 138 136 137  Potassium 3.0 Low  3.5 3.1 Low  3.4 Low  3.6 3.9  3.6  Chloride 98 100 100 104 105 104 106  CO2 31 26 24 17  Low  16 Low  17 Low  16 Low   Glucose, Bld 134 High  144 High  CM 146 High  CM 136 High  CM 110 High  CM 96 CM 86 CM  BUN 12 12 78 High  80 High  88 High  84 High  81 High   Creatinine, Ser 1.24 High  1.36 High  5.23 High  6.30 High  7.04 High  7.33 High  7.17 High   Calcium  9.3 9.0 8.0 Low  7.9 Low  8.1 Low  8.0 Low  8.0 Low   GFR, Estimated 45 Low  41 Low  CM 8 Low  CM 6 Low  CM 6 Low  CM 5 Low  CM 6 Low     Assessment and Plan:   Ronia Hazelett is a 73  y.o. y/o female has been referred for   1.  Acute on chronic anemia - Labs: CBC, iron panel, ferritin, B12, folate, and celiac lab.  - Scheduling EGD & Colonoscopy I discussed risks of EGD and colonoscopy with patient to include risk of bleeding, perforation, and risk of sedation.  Patient expressed understanding and agrees to proceed with procedures.    2.  History of kidney injury secondary to ureter calculi s/p stent - Significantly improved, followed by urology  Assessment and Plan Assessment & Plan       Follow up ***  Sue Console, PA-C   "

## 2024-10-15 ENCOUNTER — Ambulatory Visit: Admitting: Physician Assistant

## 2024-10-17 DIAGNOSIS — N179 Acute kidney failure, unspecified: Secondary | ICD-10-CM | POA: Diagnosis not present

## 2024-10-17 DIAGNOSIS — I129 Hypertensive chronic kidney disease with stage 1 through stage 4 chronic kidney disease, or unspecified chronic kidney disease: Secondary | ICD-10-CM | POA: Diagnosis not present

## 2024-10-17 DIAGNOSIS — R531 Weakness: Secondary | ICD-10-CM | POA: Diagnosis not present

## 2024-10-17 DIAGNOSIS — N1832 Chronic kidney disease, stage 3b: Secondary | ICD-10-CM | POA: Diagnosis not present

## 2024-10-19 ENCOUNTER — Encounter: Payer: Self-pay | Admitting: Gastroenterology

## 2024-10-19 ENCOUNTER — Other Ambulatory Visit

## 2024-10-19 ENCOUNTER — Ambulatory Visit: Admitting: Gastroenterology

## 2024-10-19 VITALS — BP 130/70 | HR 89 | Ht 66.0 in | Wt 213.0 lb

## 2024-10-19 DIAGNOSIS — K529 Noninfective gastroenteritis and colitis, unspecified: Secondary | ICD-10-CM | POA: Diagnosis not present

## 2024-10-19 DIAGNOSIS — D649 Anemia, unspecified: Secondary | ICD-10-CM

## 2024-10-19 DIAGNOSIS — K219 Gastro-esophageal reflux disease without esophagitis: Secondary | ICD-10-CM

## 2024-10-19 DIAGNOSIS — R110 Nausea: Secondary | ICD-10-CM

## 2024-10-19 DIAGNOSIS — R142 Eructation: Secondary | ICD-10-CM | POA: Diagnosis not present

## 2024-10-19 DIAGNOSIS — Z860100 Personal history of colon polyps, unspecified: Secondary | ICD-10-CM

## 2024-10-19 LAB — BASIC METABOLIC PANEL WITH GFR
BUN: 55 mg/dL — ABNORMAL HIGH (ref 6–23)
CO2: 28 meq/L (ref 19–32)
Calcium: 10.1 mg/dL (ref 8.4–10.5)
Chloride: 98 meq/L (ref 96–112)
Creatinine, Ser: 2.59 mg/dL — ABNORMAL HIGH (ref 0.40–1.20)
GFR: 17.72 mL/min — ABNORMAL LOW (ref >60.00)
Glucose, Bld: 157 mg/dL — ABNORMAL HIGH (ref 70–99)
Potassium: 4.1 meq/L (ref 3.5–5.1)
Sodium: 140 meq/L (ref 135–145)

## 2024-10-19 LAB — CBC WITH DIFFERENTIAL/PLATELET
Basophils Absolute: 0.1 K/uL (ref 0.0–0.1)
Basophils Relative: 0.5 % (ref 0.0–3.0)
Eosinophils Absolute: 0.2 K/uL (ref 0.0–0.7)
Eosinophils Relative: 1.8 % (ref 0.0–5.0)
HCT: 33.6 % — ABNORMAL LOW (ref 36.0–46.0)
Hemoglobin: 10.9 g/dL — ABNORMAL LOW (ref 12.0–15.0)
Lymphocytes Relative: 22.8 % (ref 12.0–46.0)
Lymphs Abs: 2.8 K/uL (ref 0.7–4.0)
MCHC: 32.6 g/dL (ref 30.0–36.0)
MCV: 85.9 fl (ref 78.0–100.0)
Monocytes Absolute: 0.6 K/uL (ref 0.1–1.0)
Monocytes Relative: 4.7 % (ref 3.0–12.0)
Neutro Abs: 8.5 K/uL — ABNORMAL HIGH (ref 1.4–7.7)
Neutrophils Relative %: 70.2 % (ref 43.0–77.0)
Platelets: 232 K/uL (ref 150.0–400.0)
RBC: 3.91 Mil/uL (ref 3.87–5.11)
RDW: 16.9 % — ABNORMAL HIGH (ref 11.5–15.5)
WBC: 12.1 K/uL — ABNORMAL HIGH (ref 4.0–10.5)

## 2024-10-19 LAB — IBC + FERRITIN
Ferritin: 107 ng/mL (ref 10.0–291.0)
Iron: 51 ug/dL (ref 42–145)
Saturation Ratios: 13.9 % — ABNORMAL LOW (ref 20.0–50.0)
TIBC: 368.2 ug/dL (ref 250.0–450.0)
Transferrin: 263 mg/dL (ref 212.0–360.0)

## 2024-10-19 LAB — B12 AND FOLATE PANEL
Folate: 10.5 ng/mL (ref >5.9)
Vitamin B-12: 270 pg/mL (ref 211–911)

## 2024-10-19 MED ORDER — NA SULFATE-K SULFATE-MG SULF 17.5-3.13-1.6 GM/177ML PO SOLN: 1.0000 | Freq: Once | ORAL | 0 refills | Status: AC

## 2024-10-19 NOTE — Patient Instructions (Addendum)
 Chronic diarrhea OTC imodium is ok to use, avoid 10 days prior to colonoscopy Recommend low fodmap diet Add OTC psyllium husk 1 tsp po daily , helps soak up looseness in stool  GERD Continue omeprazole twice daily Recommend GERD diet  Belching Try cutting down on carbonated beverages Eat slowly Avoid using straws  We have sent the following medications to your pharmacy for you to pick up at your convenience: SUPREP  Your provider has requested that you go to the basement level for lab work before leaving today. Press B on the elevator. The lab is located at the first door on the left as you exit the elevator.  You have been scheduled for an endoscopy and colonoscopy. Please follow the written instructions given to you at your visit today.  If you use inhalers (even only as needed), please bring them with you on the day of your procedure.  DO NOT TAKE 7 DAYS PRIOR TO TEST- Trulicity (dulaglutide) Ozempic, Wegovy (semaglutide) Mounjaro, Zepbound (tirzepatide) Bydureon Bcise (exanatide extended release)  DO NOT TAKE 1 DAY PRIOR TO YOUR TEST Rybelsus (semaglutide) Adlyxin (lixisenatide) Victoza (liraglutide) Byetta (exanatide) ___________________________________________________________________________  Due to recent changes in healthcare laws, you may see the results of your imaging and laboratory studies on MyChart before your provider has had a chance to review them.  We understand that in some cases there may be results that are confusing or concerning to you. Not all laboratory results come back in the same time frame and the provider may be waiting for multiple results in order to interpret others.  Please give us  48 hours in order for your provider to thoroughly review all the results before contacting the office for clarification of your results.   _______________________________________________________  If your blood pressure at your visit was 140/90 or greater, please  contact your primary care physician to follow up on this.  _______________________________________________________  If you are age 74 or older, your body mass index should be between 23-30. Your Body mass index is 34.38 kg/m. If this is out of the aforementioned range listed, please consider follow up with your Primary Care Provider.  If you are age 62 or younger, your body mass index should be between 19-25. Your Body mass index is 34.38 kg/m. If this is out of the aformentioned range listed, please consider follow up with your Primary Care Provider.   ________________________________________________________  The [facility] providers would like to encourage you to use MYCHART to communicate with providers for non-urgent requests or questions.  Due to long hold times on the telephone, sending your provider a message by [facility] may be a faster and more efficient way to get a response.  Please allow 48 business hours for a response.  Please remember that this is for non-urgent requests.  _______________________________________________________  [facility] is using a team-based approach to care.  Your team is made up of your doctor and two to three APPS. Our APPS (Nurse Practitioners and Physician Assistants) work with your physician to ensure care continuity for you. They are fully qualified to address your health concerns and develop a treatment plan. They communicate directly with your gastroenterologist to care for you. Seeing the Advanced Practice Practitioners on your physician's team can help you by facilitating care more promptly, often allowing for earlier appointments, access to diagnostic testing, procedures, and other specialty referrals.   Thank you for trusting me with your gastrointestinal care. Deanna May, FNP-C

## 2024-10-19 NOTE — Progress Notes (Signed)
 Chief Complaint:worsening anemia since hospitalization with sepsis and kidney stone.  Primary GI Doctor: Dr. Suzann  HPI:  Patient is a  73  year old female patient with past medical history of f anemia, anxiety, depression, bilateral carotid arterial disease, type 2 diabetes, class II obesity, GERD, grade 1 diastolic dysfunction, who was referred to me by Clarice Nottingham, MD on 08/17/24 for a evaluation of worsening anemia since hospitalization with sepsis and kidney stone.  .    Recent admission 8/11-8/21/2025 with severe sepsis. Blood cultures/urine showing pan sensitive E. Coli.  Urology consulted. Pt underwent cystoscopy with stent placement 06/18/24, no anesthesia complications noted.   08/20/24 Cystoscopy, ureteroscopy/stent removal  10/02/24 seen by cardiology reviewed entire note.  She was previously seen Dr. Rosalie for GERD and chronic diarrhea.   Interval History Patient presents for evaluation of anemia and chronic diarrhea/  Reports no history of anemia. Not currently on any oral iron supplement. She has been experiencing a lot of fatigue and taking naps during the day.   Patient has history of GERD and currently taking Omeprazole 40mg  twice daily. She reports as of recent she has been belching a lot.  She cut down a 6 pack of Pepsi a day to 2 per day.  No dysphagia.  She has daily nausea, reports it started with the Ozempic and has continued. Previously on Ozempic, changed to Mounjaro due to abdominal pain.  Patient on mournjaro now for DM. Reports nausea not as bad. Takes ondansetron  prn, about once a week. Patient reports no appetite, forces herself to eat once a day. She reports she gets nauseated if she eats more than that.  Patient reports she has had chronic diarrhea for 6 years or more. She reports it occurs couple times a week. She will have one very loose explosive stool then she's done. Intermittent cramping with diarrhea. She reports Dr. Rosalie had given her  cholestyramine but she couldn't stomach it so stopped it. She takes OTC 2 imodium prn which works well.    Last EGD/colon was within last 5 years with Dr Rosalie. Reports normal EGD. Colon 1 benign polyps. Recall 5 years, reports she is due.  Nonsmoker. No alcohol use.   Patient on baby Asa 81mg  po daily. She does not take any additional NSAID's  Surgical history: 08/20/24 Cystoscopy, ureteroscopy/stent removal, gall bladder (2014)  Patient's family history includes: aunt with breast CA, father with throat CA  Wt Readings from Last 3 Encounters:  10/19/24 213 lb (96.6 kg)  10/02/24 216 lb (98 kg)  08/20/24 205 lb 14.6 oz (93.4 kg)    Past Medical History:  Diagnosis Date   Anemia    hx of   Anginal pain    hx of   Anxiety    Arthritis    Chronic kidney disease    Depression    Diabetes mellitus without complication (HCC)    GERD (gastroesophageal reflux disease)    Headache(784.0)    Heart murmur    History of kidney stones    Hyperlipidemia    Hypertension    Hypothyroidism    MRSA (methicillin resistant staph aureus) culture positive    many years ago-abdominal wound- no issuses since.   Peripheral vascular disease    PONV (postoperative nausea and vomiting)    severe   Sleep apnea    Study done -remains under evaluation- no cpap yet.   Vitamin D deficiency     Past Surgical History:  Procedure Laterality Date  ABDOMINAL HYSTERECTOMY  1970's   APPENDECTOMY  1970's   BREAST BIOPSY Left 2018   CARDIAC CATHETERIZATION     CHOLECYSTECTOMY N/A 10/02/2013   Procedure: LAPAROSCOPIC CHOLECYSTECTOMY;  Surgeon: Krystal CHRISTELLA Spinner, MD;  Location: [facility];  Service: General;  Laterality: N/A;   cyst removed Left    wristganglion   CYSTOSCOPY WITH STENT PLACEMENT Left 06/18/2024   Procedure: CYSTOSCOPY, WITH STENT INSERTION;  Surgeon: Renda Glance, MD;  Location: [facility];  Service: Urology;  Laterality: Left;   CYSTOSCOPY/URETEROSCOPY/HOLMIUM LASER/STENT PLACEMENT Left  08/20/2024   Procedure: CYSTOSCOPY/URETEROSCOPY/STENT REMOVAL;  Surgeon: Renda Glance, MD;  Location: [facility];  Service: Urology;  Laterality: Left;   ELBOW SURGERY Left    HARDWARE REMOVAL Left    ankle   KNEE ARTHROSCOPY Right 06/30/2016   Procedure: ARTHROSCOPY RIGHT KNEE WITH MEDIAL AND LATERAL MENSICAL DEBRIDEMENT;  Surgeon: Dempsey Moan, MD;  Location: [facility];  Service: Orthopedics;  Laterality: Right;  LMA   LEG SURGERY Left 1980's   broke leg and ankle   SHOULDER ARTHROSCOPY Bilateral    TONSILLECTOMY  1970's   TUBAL LIGATION  1970's    Current Outpatient Medications  Medication Sig Dispense Refill   acetaminophen  (TYLENOL ) 650 MG CR tablet Take 1,300 mg by mouth every 8 (eight) hours as needed for pain.     amLODipine  (NORVASC ) 2.5 MG tablet Take 2.5 mg by mouth in the morning.     aspirin  EC 81 MG tablet Take 81 mg by mouth daily.     buPROPion  (WELLBUTRIN  XL) 150 MG 24 hr tablet Take 150 mg by mouth in the morning.     Cholecalciferol (VITAMIN D-3 PO) Take 1 capsule by mouth in the morning.     furosemide  (LASIX ) 80 MG tablet TAKE 1 TABLET BY MOUTH EVERY DAY 90 tablet 3   insulin  NPH-regular Human (NOVOLIN 70/30) (70-30) 100 UNIT/ML injection Inject 10-20 Units into the skin See admin instructions. Inject 10-15 units into the skin with breakfast & inject 20-30 units into the skin with dinner.     levothyroxine  (SYNTHROID ) 75 MCG tablet Take 75 mcg by mouth daily before breakfast.     Na Sulfate-K Sulfate-Mg Sulfate concentrate (SUPREP) 17.5-3.13-1.6 GM/177ML SOLN Take 1 kit (354 mLs total) by mouth once for 1 dose. 354 mL 0   olmesartan  (BENICAR ) 40 MG tablet Take 40 mg by mouth daily.     omeprazole (PRILOSEC) 40 MG capsule Take 40 mg by mouth 2 (two) times daily.     ondansetron  (ZOFRAN ) 8 MG tablet Take 8 mg by mouth daily as needed for nausea or vomiting.     rosuvastatin  (CRESTOR ) 40 MG tablet Take 40 mg by mouth in the morning.     sertraline  (ZOLOFT ) 100 MG tablet  Take 100 mg by mouth at bedtime.     tirzepatide (MOUNJARO) 5 MG/0.5ML Pen Inject 5 mg into the skin every Monday.     ciprofloxacin  (CIPRO ) 250 MG tablet Take 250 mg by mouth 2 (two) times daily.     ciprofloxacin  (CIPRO ) 250 MG tablet Take 1 tablet (250 mg total) by mouth 2 (two) times daily. 10 tablet 0   tamsulosin  (FLOMAX ) 0.4 MG CAPS capsule Take 1 capsule (0.4 mg total) by mouth daily after supper. 30 capsule 0   No current facility-administered medications for this visit.    Allergies as of 10/19/2024 - Review Complete 10/19/2024  Allergen Reaction Noted   Actos [pioglitazone] Other (See Comments) 11/25/2023   Codeine Nausea And Vomiting 06/18/2024  Ms contin  [morphine ] Other (See Comments) 06/18/2024   Neurontin  [gabapentin ] Other (See Comments) 06/18/2024   Ozempic (0.25 or 0.5 mg-dose) [semaglutide(0.25 or 0.5mg -dos)] Nausea And Vomiting 11/25/2023   Tape Rash 06/22/2016    Family History  Problem Relation Age of Onset   Diabetes Mother    Heart disease Mother        CABG age 59s   Cancer Father        throat and lung   Heart disease Father    Cancer Paternal Aunt        breast   Breast cancer Paternal Aunt    Cancer Paternal Aunt        colon    Review of Systems:    Constitutional: No weight loss, fever, chills, weakness or fatigue HEENT: Eyes: No change in vision               Ears, Nose, Throat:  No change in hearing or congestion Skin: No rash or itching Cardiovascular: No chest pain, chest pressure or palpitations   Respiratory: No SOB or cough Gastrointestinal: See HPI and otherwise negative Genitourinary: No dysuria or change in urinary frequency Neurological: No headache, dizziness or syncope Musculoskeletal: No new muscle or joint pain Hematologic: No bleeding or bruising Psychiatric: No history of depression or anxiety    Physical Exam:  Vital signs: BP 130/70   Pulse 89   Ht 5' 6 (1.676 m)   Wt 213 lb (96.6 kg)   BMI 34.38 kg/m    Constitutional: Pleasant female appears to be in NAD, Well developed, Well nourished, alert and cooperative Eyes:   PEERL, EOMI. No icterus. Conjunctiva pink. Neck:  Supple Throat: Oral cavity and pharynx without inflammation, swelling or lesion.  Respiratory: Respirations even and unlabored. Lungs clear to auscultation bilaterally.   No wheezes, crackles, or rhonchi.  Cardiovascular: Normal S1, S2. Regular rate and rhythm. No peripheral edema, cyanosis or pallor.  Gastrointestinal:  Soft, nondistended, nontender. No rebound or guarding. Normal bowel sounds. No appreciable masses or hepatomegaly. Rectal: Normal external rectal exam, normal rectal tone, non-tender, no masses, , brown stool, hemoccult Negative. Chaperone Denise Msk:  uses walker  Neurologic:  Alert and  oriented x4;  grossly normal neurologically.  Skin:   Dry and intact without significant lesions or rashes.  RELEVANT LABS AND IMAGING: CBC    Latest Ref Rng & Units 10/19/2024    2:59 PM 08/07/2024   11:52 AM 06/23/2024   11:59 AM  CBC  WBC 4.0 - 10.5 K/uL 12.1  10.2  12.1   Hemoglobin 12.0 - 15.0 g/dL 89.0  8.6  88.4   Hematocrit 36.0 - 46.0 % 33.6  27.8  36.4   Platelets 150.0 - 400.0 K/uL 232.0  320  193      CMP     Latest Ref Rng & Units 10/19/2024    2:59 PM 08/20/2024   11:59 AM 08/07/2024   11:52 AM  CMP  Glucose 70 - 99 mg/dL 842  865  855   BUN 6 - 23 mg/dL 55  12  12   Creatinine 0.40 - 1.20 mg/dL 7.40  8.75  8.63   Sodium 135 - 145 mEq/L 140  140  138   Potassium 3.5 - 5.1 mEq/L 4.1  3.0  3.5   Chloride 96 - 112 mEq/L 98  98  100   CO2 19 - 32 mEq/L 28  31  26    Calcium  8.4 - 10.5 mg/dL 89.8  9.3  9.0      Lab Results  Component Value Date   TSH 2.064 07/14/2011   07/2023 echo- Left ventricular ejection fraction, by estimation, is 60 to 65%.   06/2024 CT renal stone IMPRESSION: Stable proximal left ureteral stone similar to that seen on the prior exam. Mild hydronephrosis is noted. No new  focal abnormality is seen.  Assessment/Plan: Encounter Diagnoses  Name Primary?   Anemia, unspecified type Yes   Chronic diarrhea    History of colonic polyps    Gastroesophageal reflux disease, unspecified whether esophagitis present    Nausea without vomiting    Belching    #20 73 year old female patient that presents with anemia, Hgb 12 (August) , dropped to 8.6 (Sept). No previous history. Per patient had EGD/colon with Dr. Rosalie and normal except 1 benign polyp.  Not on oral iron. No overt bleeding. Fecal occult negative today. Pt on baby ASA.  -check CBC,BMET, anemia panel -fecal occult today in office (negative) -Schedule EGD in [facility] with Dr. Suzann. The risks and benefits of EGD with possible biopsies and esophageal dilation were discussed with the patient who agrees to proceed. -Schedule for a colonoscopy in [facility] with Dr. Suzann.. The risks and benefits of colonoscopy with possible polypectomy / biopsies were discussed and the patient agrees to proceed.  -hold mounjaro 1 week prior to procedures -request records from Dr. Rosalie  #2 History of colonic polyps #3 History of chronic diarrhea 6+ years, reports has been worked up by Dr. Rosalie. Cannot tolerate cholestyramine.  Pt on Benicar  which can cause diarrhea. -recommend low fodmap diet - OTC imodium prn  -Add OTC psyllium husk 1 tsp po daily  -Schedule for a colonoscopy in [facility] with Dr. Suzann.. The risks and benefits of colonoscopy with possible polypectomy / biopsies were discussed and the patient agrees to proceed.  -request records   #4 History of GERD, Belching - continue omeprazole 40 mg twice daily -consider switching PPI therapy, declined for now  #5 Nausea, known SE of effect of GLP-1 -recommend small meals spread through out the day -antiemetics prn  #6 CKD stage 3, newly diagnosed -seen by nephrology Dr. Marlee  #7 Diabetes mellitus    Thank you for the courtesy of this consult. Please call me with any  questions or concerns.   Garnell Begeman, FNP-C [facility] 10/19/2024, 5:18 PM  Cc: Clarice Nottingham, MD

## 2024-10-23 ENCOUNTER — Ambulatory Visit: Payer: Self-pay | Admitting: Gastroenterology

## 2024-11-05 ENCOUNTER — Ambulatory Visit: Admitting: Podiatry

## 2024-11-14 ENCOUNTER — Encounter: Payer: Self-pay | Admitting: Cardiovascular Disease

## 2024-11-18 NOTE — Progress Notes (Unsigned)
 [facility] History and Physical   Primary Care Physician:  Clarice Nottingham, MD   Reason for Procedure:  Nausea, GERD, chronic diarrhea, anemia, history of colon polyps  Plan:    Upper endoscopy and colonoscopy   The patient was provided an opportunity to ask questions and all were answered. The patient agreed with the plan.   HPI: Sue Davidson is a 74 y.o. female undergoing upper endoscopy and colonoscopy for investigation of nausea, GERD, chronic diarrhea, anemia and history of colon polyps.  Patient reports a longstanding history of GERD for which she takes omeprazole 40 mg p.o. twice daily.  Despite this has symptoms of belching.  Endorses nausea associated with GLP-1 medication use.  Has had chronic diarrhea for 6 years or more.  Previously evaluated by Dr. Rosalie with EGD and colonoscopy within the last 5 years.  EGD normal.  Reports 1 benign colon polyp on colonoscopy.  Was prescribed cholestyramine but unable to tolerate this.  Takes as needed Imodium.  No family history of gastrointestinal disorders, colon cancer or colon polyps.  Labs have shown persistent anemia since hospitalization for sepsis summer 2025.  Most recent hemoglobin 12.2 thousand 2510.9.   Past Medical History:  Diagnosis Date   Anemia    hx of   Anginal pain    hx of   Anxiety    Arthritis    Chronic kidney disease    Depression    Diabetes mellitus without complication (HCC)    GERD (gastroesophageal reflux disease)    Headache(784.0)    Heart murmur    History of kidney stones    Hyperlipidemia    Hypertension    Hypothyroidism    MRSA (methicillin resistant staph aureus) culture positive    many years ago-abdominal wound- no issuses since.   Peripheral vascular disease    PONV (postoperative nausea and vomiting)    severe   Sleep apnea    Study done -remains under evaluation- no cpap yet.   Vitamin D deficiency     Past Surgical History:  Procedure Laterality Date   ABDOMINAL  HYSTERECTOMY  1970's   APPENDECTOMY  1970's   BREAST BIOPSY Left 2018   CARDIAC CATHETERIZATION     CHOLECYSTECTOMY N/A 10/02/2013   Procedure: LAPAROSCOPIC CHOLECYSTECTOMY;  Surgeon: Krystal CHRISTELLA Spinner, MD;  Location: [facility];  Service: General;  Laterality: N/A;   cyst removed Left    wristganglion   CYSTOSCOPY WITH STENT PLACEMENT Left 06/18/2024   Procedure: CYSTOSCOPY, WITH STENT INSERTION;  Surgeon: Renda Glance, MD;  Location: [facility];  Service: Urology;  Laterality: Left;   CYSTOSCOPY/URETEROSCOPY/HOLMIUM LASER/STENT PLACEMENT Left 08/20/2024   Procedure: CYSTOSCOPY/URETEROSCOPY/STENT REMOVAL;  Surgeon: Renda Glance, MD;  Location: [facility];  Service: Urology;  Laterality: Left;   ELBOW SURGERY Left    HARDWARE REMOVAL Left    ankle   KNEE ARTHROSCOPY Right 06/30/2016   Procedure: ARTHROSCOPY RIGHT KNEE WITH MEDIAL AND LATERAL MENSICAL DEBRIDEMENT;  Surgeon: Dempsey Moan, MD;  Location: [facility];  Service: Orthopedics;  Laterality: Right;  LMA   LEG SURGERY Left 1980's   broke leg and ankle   SHOULDER ARTHROSCOPY Bilateral    TONSILLECTOMY  1970's   TUBAL LIGATION  1970's    Prior to Admission medications  Medication Sig Start Date End Date Taking? Authorizing Provider  acetaminophen  (TYLENOL ) 650 MG CR tablet Take 1,300 mg by mouth every 8 (eight) hours as needed for pain.    [provider]  amLODipine  (NORVASC ) 2.5 MG tablet Take  2.5 mg by mouth in the morning.    [provider]  aspirin  EC 81 MG tablet Take 81 mg by mouth daily.    [provider]  buPROPion  (WELLBUTRIN  XL) 150 MG 24 hr tablet Take 150 mg by mouth in the morning.    [provider]  Cholecalciferol (VITAMIN D-3 PO) Take 1 capsule by mouth in the morning.    [provider]  ciprofloxacin  (CIPRO ) 250 MG tablet Take 250 mg by mouth 2 (two) times daily.    [provider]  ciprofloxacin  (CIPRO ) 250 MG tablet Take 1 tablet (250 mg total) by mouth 2 (two) times  daily. 08/20/24   Renda Glance, MD  furosemide  (LASIX ) 80 MG tablet TAKE 1 TABLET BY MOUTH EVERY DAY 10/30/19   Meng, Hao, PA  insulin  NPH-regular Human (NOVOLIN 70/30) (70-30) 100 UNIT/ML injection Inject 10-20 Units into the skin See admin instructions. Inject 10-15 units into the skin with breakfast & inject 20-30 units into the skin with dinner.    [provider]  levothyroxine  (SYNTHROID ) 75 MCG tablet Take 75 mcg by mouth daily before breakfast.    [provider]  olmesartan  (BENICAR ) 40 MG tablet Take 40 mg by mouth daily.    [provider]  omeprazole (PRILOSEC) 40 MG capsule Take 40 mg by mouth 2 (two) times daily. 07/18/13   [provider]  ondansetron  (ZOFRAN ) 8 MG tablet Take 8 mg by mouth daily as needed for nausea or vomiting.    [provider]  rosuvastatin  (CRESTOR ) 40 MG tablet Take 40 mg by mouth in the morning.    [provider]  sertraline  (ZOLOFT ) 100 MG tablet Take 100 mg by mouth at bedtime.    [provider]  tamsulosin  (FLOMAX ) 0.4 MG CAPS capsule Take 1 capsule (0.4 mg total) by mouth daily after supper. 06/15/24   Emil Share, DO  tirzepatide [facility]) 5 MG/0.5ML Pen Inject 5 mg into the skin every Monday.    [provider]    Current Outpatient Medications  Medication Sig Dispense Refill   acetaminophen  (TYLENOL ) 650 MG CR tablet Take 1,300 mg by mouth every 8 (eight) hours as needed for pain.     amLODipine  (NORVASC ) 2.5 MG tablet Take 2.5 mg by mouth in the morning.     aspirin  EC 81 MG tablet Take 81 mg by mouth daily.     buPROPion  (WELLBUTRIN  XL) 150 MG 24 hr tablet Take 150 mg by mouth in the morning.     Cholecalciferol (VITAMIN D-3 PO) Take 1 capsule by mouth in the morning.     ciprofloxacin  (CIPRO ) 250 MG tablet Take 250 mg by mouth 2 (two) times daily.     ciprofloxacin  (CIPRO ) 250 MG tablet Take 1 tablet (250 mg total) by mouth 2 (two) times daily. 10 tablet 0   furosemide   (LASIX ) 80 MG tablet TAKE 1 TABLET BY MOUTH EVERY DAY 90 tablet 3   insulin  NPH-regular Human (NOVOLIN 70/30) (70-30) 100 UNIT/ML injection Inject 10-20 Units into the skin See admin instructions. Inject 10-15 units into the skin with breakfast & inject 20-30 units into the skin with dinner.     levothyroxine  (SYNTHROID ) 75 MCG tablet Take 75 mcg by mouth daily before breakfast.     olmesartan  (BENICAR ) 40 MG tablet Take 40 mg by mouth daily.     omeprazole (PRILOSEC) 40 MG capsule Take 40 mg by mouth 2 (two) times daily.     ondansetron  (ZOFRAN ) 8  MG tablet Take 8 mg by mouth daily as needed for nausea or vomiting.     rosuvastatin  (CRESTOR ) 40 MG tablet Take 40 mg by mouth in the morning.     sertraline  (ZOLOFT ) 100 MG tablet Take 100 mg by mouth at bedtime.     tamsulosin  (FLOMAX ) 0.4 MG CAPS capsule Take 1 capsule (0.4 mg total) by mouth daily after supper. 30 capsule 0   tirzepatide (MOUNJARO) 5 MG/0.5ML Pen Inject 5 mg into the skin every Monday.     No current facility-administered medications for this visit.    Allergies as of 11/21/2024 - Review Complete 10/19/2024  Allergen Reaction Noted   Actos [pioglitazone] Other (See Comments) 11/25/2023   Codeine Nausea And Vomiting 06/18/2024   Ms contin  [morphine ] Other (See Comments) 06/18/2024   Neurontin  [gabapentin ] Other (See Comments) 06/18/2024   Ozempic (0.25 or 0.5 mg-dose) [semaglutide(0.25 or 0.5mg -dos)] Nausea And Vomiting 11/25/2023   Tape Rash 06/22/2016    Family History  Problem Relation Age of Onset   Diabetes Mother    Heart disease Mother        CABG age 74s   Cancer Father        throat and lung   Heart disease Father    Cancer Paternal Aunt        breast   Breast cancer Paternal Aunt    Cancer Paternal Aunt        colon    Social History   Socioeconomic History   Marital status: Widowed    Spouse name: Not on file   Number of children: Not on file   Years of education: Not on file   Highest  education level: Not on file  Occupational History   Occupation: retired  Tobacco Use   Smoking status: Never   Smokeless tobacco: Never  Vaping Use   Vaping status: Never Used  Substance and Sexual Activity   Alcohol use: No    Alcohol/week: 0.0 standard drinks of alcohol   Drug use: No   Sexual activity: Yes    Birth control/protection: Spermicide  Other Topics Concern   Not on file  Social History Narrative   Epworth Sleepiness Scale Score:  15      --I have HTN   --I have had Insomnia   --I feel stressed and lack motivation   --I have Diabetes   --I am overweight or am gaining weight   --I awake feeling not rested   Social Drivers of Health   Tobacco Use: Low Risk (10/19/2024)   Patient History    Smoking Tobacco Use: Never    Smokeless Tobacco Use: Never    Passive Exposure: Not on file  Financial Resource Strain: Not on file  Food Insecurity: Food Insecurity Present (06/21/2024)   Epic    Worried About Programme Researcher, Broadcasting/film/video in the Last Year: Sometimes true    [employer] of Food in the Last Year: Never true  Transportation Needs: No Transportation Needs (06/21/2024)   Epic    Lack of Transportation (Medical): No    Lack of Transportation (Non-Medical): No  Physical Activity: Not on file  Stress: Not on file  Social Connections: Moderately Integrated (06/18/2024)   Social Connection and Isolation Panel    Frequency of Communication with Friends and Family: More than three times a week    Frequency of Social Gatherings with Friends and Family: More than three times a week    Attends Religious Services: More than 4 times per  year    Active Member of Clubs or Organizations: Yes    Attends Banker Meetings: 1 to 4 times per year    Marital Status: Widowed  Intimate Partner Violence: Not At Risk (06/21/2024)   Epic    Fear of Current or Ex-Partner: No    Emotionally Abused: No    Physically Abused: No    Sexually Abused: No  Depression (PHQ2-9): Not on  file  Alcohol Screen: Not on file  Housing: Low Risk (06/21/2024)   Epic    Unable to Pay for Housing in the Last Year: No    Number of Times Moved in the Last Year: 0    Homeless in the Last Year: No  Utilities: Not At Risk (06/21/2024)   Epic    Threatened with loss of utilities: No  Health Literacy: Not on file    Review of Systems:  All other review of systems negative except as mentioned in the HPI.  Physical Exam: Vital signs There were no vitals taken for this visit.  General:   Alert,  Well-developed, well-nourished, pleasant and cooperative in NAD Airway:  Mallampati  Lungs:  Clear throughout to auscultation.   Heart:  Regular rate and rhythm; no murmurs, clicks, rubs,  or gallops. Abdomen:  Soft, nontender and nondistended. Normal bowel sounds.   Neuro/Psych:  Normal mood and affect. A and O x 3  Inocente Hausen, MD [facility]

## 2024-11-21 ENCOUNTER — Ambulatory Visit: Admitting: Pediatrics

## 2024-11-21 ENCOUNTER — Encounter: Payer: Self-pay | Admitting: Pediatrics

## 2024-11-21 VITALS — BP 144/51 | HR 61 | Temp 97.3°F | Resp 18 | Ht 66.0 in | Wt 213.0 lb

## 2024-11-21 DIAGNOSIS — Z860100 Personal history of colon polyps, unspecified: Secondary | ICD-10-CM | POA: Diagnosis not present

## 2024-11-21 DIAGNOSIS — D649 Anemia, unspecified: Secondary | ICD-10-CM | POA: Diagnosis not present

## 2024-11-21 DIAGNOSIS — K2970 Gastritis, unspecified, without bleeding: Secondary | ICD-10-CM

## 2024-11-21 DIAGNOSIS — K648 Other hemorrhoids: Secondary | ICD-10-CM

## 2024-11-21 DIAGNOSIS — K529 Noninfective gastroenteritis and colitis, unspecified: Secondary | ICD-10-CM | POA: Diagnosis not present

## 2024-11-21 DIAGNOSIS — D122 Benign neoplasm of ascending colon: Secondary | ICD-10-CM

## 2024-11-21 DIAGNOSIS — K219 Gastro-esophageal reflux disease without esophagitis: Secondary | ICD-10-CM | POA: Diagnosis not present

## 2024-11-21 DIAGNOSIS — R110 Nausea: Secondary | ICD-10-CM

## 2024-11-21 DIAGNOSIS — R142 Eructation: Secondary | ICD-10-CM

## 2024-11-21 MED ORDER — SODIUM CHLORIDE 0.9 % IV SOLN: 500.0000 mL | Freq: Once | INTRAVENOUS | Status: DC

## 2024-11-21 NOTE — Progress Notes (Signed)
 Pt's states no medical or surgical changes since previsit or office visit.

## 2024-11-21 NOTE — Op Note (Addendum)
 [facility] Patient Name: Sue Davidson Procedure Date: 11/21/2024 2:03 PM MRN: [id number] Endoscopist: Inocente Hausen , MD, [id number] Age: 74 Referring MD:  Date of Birth: 31-Aug-1951 Gender: Female Account #: [id number] Procedure:                Upper GI endoscopy Indications:              Follow-up of gastro-esophageal reflux disease,                            Nausea, Anemia Medicines:                Monitored Anesthesia Care Procedure:                Pre-Anesthesia Assessment:                           - Prior to the procedure, a History and Physical                            was performed, and patient medications and                            allergies were reviewed. The patient's tolerance of                            previous anesthesia was also reviewed. The risks                            and benefits of the procedure and the sedation                            options and risks were discussed with the patient.                            All questions were answered, and informed consent                            was obtained. Prior Anticoagulants: The patient has                            taken no anticoagulant or antiplatelet agents. ASA                            Grade Assessment: III - A patient with severe                            systemic disease. After reviewing the risks and                            benefits, the patient was deemed in satisfactory                            condition to undergo the procedure.  After obtaining informed consent, the endoscope was                            passed under direct vision. Throughout the                            procedure, the patient's blood pressure, pulse, and                            oxygen saturations were monitored continuously. The                            GIF [id number] #[id number] was introduced through the                            mouth, and advanced to the second part of  duodenum.                            The upper GI endoscopy was accomplished without                            difficulty. The patient tolerated the procedure                            well. Scope In: 2:28:19 PM Scope Out: 2:46:02 PM Scope Withdrawal Time: 0 hours 13 minutes 41 seconds  Total Procedure Duration: 0 hours 17 minutes 43 seconds  Findings:                 The examined esophagus was normal.                           The gastric body, gastric antrum, cardia (on                            retroflexion) and gastric fundus (on retroflexion)                            were normal. Biopsies were taken with a cold                            forceps for Helicobacter pylori testing.                           The duodenal bulb and second portion of the                            duodenum were normal. Biopsies for histology were                            taken with a cold forceps for evaluation of celiac                            disease. Complications:  No immediate complications. Estimated blood loss:                            Minimal. Estimated Blood Loss:     Estimated blood loss was minimal. Impression:               - Normal esophagus.                           - Normal gastric body, antrum, cardia and gastric                            fundus. Biopsied.                           - Normal duodenal bulb and second portion of the                            duodenum. Biopsied. Recommendation:           - Await pathology results.                           - Perform a colonoscopy.                           - The findings and recommendations were discussed                            with the patient's family. Inocente Hausen, MD 11/21/2024 2:18:37 PM This report has been signed electronically.

## 2024-11-21 NOTE — Patient Instructions (Addendum)
-  Await pathology results -Handout on polyps and hemorrhoids provided -Return to GI clinic in 4 weeks  YOU HAD AN ENDOSCOPIC PROCEDURE TODAY AT THE [facility]:   Refer to the procedure report that was given to you for any specific questions about what was found during the examination.  If the procedure report does not answer your questions, please call your gastroenterologist to clarify.  If you requested that your care partner not be given the details of your procedure findings, then the procedure report has been included in a sealed envelope for you to review at your convenience later.  YOU SHOULD EXPECT: Some feelings of bloating in the abdomen. Passage of more gas than usual.  Walking can help get rid of the air that was put into your GI tract during the procedure and reduce the bloating. If you had a lower endoscopy (such as a colonoscopy or flexible sigmoidoscopy) you may notice spotting of blood in your stool or on the toilet paper. If you underwent a bowel prep for your procedure, you may not have a normal bowel movement for a few days.  Please Note:  You might notice some irritation and congestion in your nose or some drainage.  This is from the oxygen used during your procedure.  There is no need for concern and it should clear up in a day or so.  SYMPTOMS TO REPORT IMMEDIATELY:  Following lower endoscopy (colonoscopy or flexible sigmoidoscopy):  Excessive amounts of blood in the stool  Significant tenderness or worsening of abdominal pains  Swelling of the abdomen that is new, acute  Fever of 100F or higher  Following upper endoscopy (EGD)  Vomiting of blood or coffee ground material  New chest pain or pain under the shoulder blades  Painful or persistently difficult swallowing  New shortness of breath  Fever of 100F or higher  Black, tarry-looking stools  For urgent or emergent issues, a gastroenterologist can be reached at any hour by calling [phone]. Do  not use MyChart messaging for urgent concerns.    DIET:  We do recommend a small meal at first, but then you may proceed to your regular diet.  Drink plenty of fluids but you should avoid alcoholic beverages for 24 hours.  ACTIVITY:  You should plan to take it easy for the rest of today and you should NOT DRIVE or use heavy machinery until tomorrow (because of the sedation medicines used during the test).    FOLLOW UP: Our staff will call the number listed on your records the next business day following your procedure.  We will call around 7:15- 8:00 am to check on you and address any questions or concerns that you may have regarding the information given to you following your procedure. If we do not reach you, we will leave a message.     If any biopsies were taken you will be contacted by phone or by letter within the next 1-3 weeks.  Please call us  at [phone] if you have not heard about the biopsies in 3 weeks.    SIGNATURES/CONFIDENTIALITY: You and/or your care partner have signed paperwork which will be entered into your electronic medical record.  These signatures attest to the fact that that the information above on your After Visit Summary has been reviewed and is understood.  Full responsibility of the confidentiality of this discharge information lies with you and/or your care-partner.

## 2024-11-21 NOTE — Op Note (Signed)
 [facility] Patient Name: Ranae Casebier Procedure Date: 11/21/2024 2:03 PM MRN: [id number] Endoscopist: Inocente Hausen , MD, [id number] Age: 74 Referring MD:  Date of Birth: 03/03/1951 Gender: Female Account #: [id number] Procedure:                Colonoscopy Indications:              Chronic diarrhea, Anemia, History of colon polyps Medicines:                Monitored Anesthesia Care Procedure:                Pre-Anesthesia Assessment:                           - Prior to the procedure, a History and Physical                            was performed, and patient medications and                            allergies were reviewed. The patient's tolerance of                            previous anesthesia was also reviewed. The risks                            and benefits of the procedure and the sedation                            options and risks were discussed with the patient.                            All questions were answered, and informed consent                            was obtained. Prior Anticoagulants: The patient has                            taken no anticoagulant or antiplatelet agents. ASA                            Grade Assessment: III - A patient with severe                            systemic disease. After reviewing the risks and                            benefits, the patient was deemed in satisfactory                            condition to undergo the procedure.                           After obtaining informed consent, the colonoscope  was passed under direct vision. Throughout the                            procedure, the patient's blood pressure, pulse, and                            oxygen saturations were monitored continuously. The                            Olympus Scope [id number] was introduced through the                            anus and advanced to the cecum, identified by                            appendiceal  orifice and ileocecal valve. The                            colonoscopy was somewhat difficult due to                            restricted mobility of the colon. Successful                            completion of the procedure was aided by                            withdrawing the scope and replacing with the                            pediatric colonoscope and straightening and                            shortening the scope to obtain bowel loop                            reduction. The patient tolerated the procedure                            well. The quality of the bowel preparation was                            good. The ileocecal valve, appendiceal orifice, and                            rectum were photographed. Findings:                 Hemorrhoids were found on perianal exam.                           The digital rectal exam was normal. Pertinent                            negatives include normal sphincter tone and no  palpable rectal lesions.                           Normal mucosa was found in the entire colon.                            Biopsies for histology were taken with a cold                            forceps for evaluation of microscopic colitis.                           A 6 mm polyp was found in the ascending colon. The                            polyp was sessile. The polyp was removed with a                            cold snare. Resection and retrieval were complete.                           The terminal ileum could not be intubated due to                            looping of the colonoscope and restricted mobility.                           Internal hemorrhoids were found during retroflexion. Complications:            No immediate complications. Estimated blood loss:                            Minimal. Estimated Blood Loss:     Estimated blood loss was minimal. Impression:               - Hemorrhoids found on perianal exam.                            - Normal mucosa in the entire examined colon.                            Biopsied.                           - One 6 mm polyp in the ascending colon, removed                            with a cold snare. Resected and retrieved.                           - Internal hemorrhoids. Recommendation:           - Discharge patient to home (ambulatory).                           - Await pathology  results.                           - Return to GI clinic in 4 weeks.                           - The findings and recommendations were discussed                            with the patient's family.                           - Patient has a contact number available for                            emergencies. The signs and symptoms of potential                            delayed complications were discussed with the                            patient. Return to normal activities tomorrow.                            Written discharge instructions were provided to the                            patient. Inocente Hausen, MD 11/21/2024 2:59:31 PM This report has been signed electronically.

## 2024-11-21 NOTE — Progress Notes (Signed)
To pacu VSS. Report to [email or web address] 

## 2024-11-21 NOTE — Progress Notes (Signed)
 Called to room to assist during endoscopic procedure.  Patient ID and intended procedure confirmed with present staff. Received instructions for my participation in the procedure from the performing physician.

## 2024-11-22 ENCOUNTER — Telehealth: Payer: Self-pay | Admitting: *Deleted

## 2024-11-22 NOTE — Telephone Encounter (Signed)
" °  Follow up Call-     11/21/2024    1:21 PM  Call back number  Post procedure Call Back phone  # [phone]  Permission to leave phone message Yes     Patient questions:  Do you have a fever, pain , or abdominal swelling? No. Pain Score  0 *  Have you tolerated food without any problems? Yes.    Have you been able to return to your normal activities? Yes.    Do you have any questions about your discharge instructions: Diet   No. Medications  No. Follow up visit  No.  Do you have questions or concerns about your Care? No.  Actions: * If pain score is 4 or above: No action needed, pain <4.   "

## 2024-11-27 LAB — SURGICAL PATHOLOGY

## 2024-12-02 ENCOUNTER — Ambulatory Visit: Payer: Self-pay | Admitting: Pediatrics

## 2024-12-10 ENCOUNTER — Ambulatory Visit: Payer: Self-pay | Admitting: Cardiology

## 2024-12-25 ENCOUNTER — Ambulatory Visit: Admitting: Pediatrics
